# Patient Record
Sex: Female | Born: 1965 | Race: Black or African American | Hispanic: No
Health system: Southern US, Academic
[De-identification: ages and names within clinical notes are randomized; demographics above are authoritative.]

## PROBLEM LIST (undated history)

## (undated) ENCOUNTER — Encounter

## (undated) ENCOUNTER — Ambulatory Visit: Payer: BLUE CROSS/BLUE SHIELD | Attending: Family Medicine | Primary: Family Medicine

## (undated) ENCOUNTER — Encounter: Payer: PRIVATE HEALTH INSURANCE | Attending: Medical | Primary: Medical

## (undated) ENCOUNTER — Ambulatory Visit

## (undated) ENCOUNTER — Telehealth

## (undated) ENCOUNTER — Encounter
Attending: Student in an Organized Health Care Education/Training Program | Primary: Student in an Organized Health Care Education/Training Program

## (undated) ENCOUNTER — Non-Acute Institutional Stay: Payer: PRIVATE HEALTH INSURANCE | Attending: Podiatrist | Primary: Podiatrist

## (undated) ENCOUNTER — Encounter: Attending: Medical | Primary: Medical

## (undated) ENCOUNTER — Ambulatory Visit: Payer: BLUE CROSS/BLUE SHIELD

## (undated) ENCOUNTER — Telehealth: Attending: Medical | Primary: Medical

## (undated) ENCOUNTER — Other Ambulatory Visit

## (undated) ENCOUNTER — Ambulatory Visit: Payer: PRIVATE HEALTH INSURANCE

## (undated) ENCOUNTER — Ambulatory Visit: Attending: Medical | Primary: Medical

## (undated) ENCOUNTER — Ambulatory Visit: Payer: PRIVATE HEALTH INSURANCE | Attending: Medical | Primary: Medical

## (undated) ENCOUNTER — Ambulatory Visit: Payer: BLUE CROSS/BLUE SHIELD | Attending: Obstetrics & Gynecology | Primary: Obstetrics & Gynecology

## (undated) ENCOUNTER — Encounter: Attending: Family Medicine | Primary: Family Medicine

## (undated) ENCOUNTER — Encounter: Attending: Obstetrics & Gynecology | Primary: Obstetrics & Gynecology

## (undated) ENCOUNTER — Ambulatory Visit
Payer: BLUE CROSS/BLUE SHIELD | Attending: Student in an Organized Health Care Education/Training Program | Primary: Student in an Organized Health Care Education/Training Program

## (undated) ENCOUNTER — Ambulatory Visit: Payer: BLUE CROSS/BLUE SHIELD | Attending: Orthopaedic Surgery | Primary: Orthopaedic Surgery

## (undated) ENCOUNTER — Encounter: Attending: Orthopaedic Surgery | Primary: Orthopaedic Surgery

## (undated) ENCOUNTER — Ambulatory Visit: Payer: PRIVATE HEALTH INSURANCE | Attending: Physician Assistant | Primary: Physician Assistant

## (undated) ENCOUNTER — Ambulatory Visit: Attending: Family | Primary: Family

## (undated) ENCOUNTER — Encounter: Attending: Orthopedic | Primary: Orthopedic

## (undated) ENCOUNTER — Telehealth: Attending: Family Medicine | Primary: Family Medicine

## (undated) ENCOUNTER — Telehealth: Attending: Obstetrics & Gynecology | Primary: Obstetrics & Gynecology

## (undated) ENCOUNTER — Encounter: Payer: PRIVATE HEALTH INSURANCE | Attending: Obesity Medicine | Primary: Obesity Medicine

## (undated) ENCOUNTER — Encounter: Attending: Surgery | Primary: Surgery

## (undated) ENCOUNTER — Encounter: Attending: Surgical Critical Care | Primary: Surgical Critical Care

## (undated) ENCOUNTER — Ambulatory Visit: Payer: Medicaid (Managed Care)

## (undated) ENCOUNTER — Ambulatory Visit: Attending: Pharmacist | Primary: Pharmacist

## (undated) ENCOUNTER — Ambulatory Visit: Payer: PRIVATE HEALTH INSURANCE | Attending: Family Medicine | Primary: Family Medicine

## (undated) ENCOUNTER — Encounter: Attending: Diagnostic Radiology | Primary: Diagnostic Radiology

## (undated) ENCOUNTER — Encounter: Attending: Registered" | Primary: Registered"

## (undated) ENCOUNTER — Ambulatory Visit
Payer: BLUE CROSS/BLUE SHIELD | Attending: Rehabilitative and Restorative Service Providers" | Primary: Rehabilitative and Restorative Service Providers"

## (undated) ENCOUNTER — Ambulatory Visit: Payer: PRIVATE HEALTH INSURANCE | Attending: Orthopaedic Surgery | Primary: Orthopaedic Surgery

## (undated) ENCOUNTER — Telehealth: Attending: Family | Primary: Family

## (undated) DIAGNOSIS — O039 Complete or unspecified spontaneous abortion without complication: Secondary | ICD-10-CM

## (undated) DIAGNOSIS — G4730 Sleep apnea, unspecified: Secondary | ICD-10-CM

## (undated) DIAGNOSIS — I10 Essential (primary) hypertension: Secondary | ICD-10-CM

## (undated) DIAGNOSIS — T7840XA Allergy, unspecified, initial encounter: Secondary | ICD-10-CM

## (undated) HISTORY — DX: Complete or unspecified spontaneous abortion without complication: O03.9

## (undated) HISTORY — DX: Essential (primary) hypertension: I10

## (undated) HISTORY — DX: Sleep apnea, unspecified: G47.30

## (undated) HISTORY — DX: Allergy, unspecified, initial encounter: T78.40XA

## (undated) MED ORDER — FERROUS SULFATE 325 MG (65 MG IRON) TABLET,DELAYED RELEASE: ORAL | 0 days

## (undated) MED ORDER — CETIRIZINE 5 MG TABLET: Freq: Every day | ORAL | 0.00000 days

## (undated) MED ORDER — FEXOFENADINE 60 MG TABLET: Freq: Two times a day (BID) | ORAL | 0 days

## (undated) MED ORDER — MULTIVITAMIN GUMMIES 200 MCG CHEWABLE TABLET: ORAL | 0 days

---

## 1993-04-14 HISTORY — PX: OTHER SURGICAL HISTORY: SHX169

## 1998-04-14 HISTORY — PX: OTHER SURGICAL HISTORY: SHX169

## 2003-04-15 DIAGNOSIS — O039 Complete or unspecified spontaneous abortion without complication: Secondary | ICD-10-CM

## 2003-04-15 HISTORY — DX: Complete or unspecified spontaneous abortion without complication: O03.9

## 2003-04-15 HISTORY — PX: DILATION AND CURETTAGE OF UTERUS: SHX78

## 2004-01-24 ENCOUNTER — Emergency Department: Payer: Self-pay | Admitting: Emergency Medicine

## 2009-10-29 ENCOUNTER — Emergency Department: Payer: Self-pay | Admitting: Emergency Medicine

## 2010-10-17 DIAGNOSIS — B353 Tinea pedis: Secondary | ICD-10-CM | POA: Insufficient documentation

## 2011-01-15 ENCOUNTER — Ambulatory Visit: Payer: Self-pay

## 2011-06-04 ENCOUNTER — Ambulatory Visit: Payer: Self-pay

## 2011-06-04 LAB — HCG, QUANTITATIVE, PREGNANCY: Beta Hcg, Quant.: <1 m[IU]/mL — ABNORMAL LOW

## 2011-06-04 LAB — BASIC METABOLIC PANEL
Anion Gap: 8 (ref 7–16)
BUN: 15 mg/dL (ref 7–18)
Creatinine: 1.01 mg/dL (ref 0.60–1.30)
EGFR (African American): >60
EGFR (Non-African Amer.): >60
Glucose: 117 mg/dL — ABNORMAL HIGH (ref 65–99)

## 2011-06-04 LAB — HEMATOCRIT: HCT: 37.3 % (ref 35.0–47.0)

## 2011-06-05 ENCOUNTER — Ambulatory Visit: Payer: Self-pay

## 2013-01-07 ENCOUNTER — Emergency Department: Payer: Self-pay | Admitting: Emergency Medicine

## 2014-08-06 NOTE — Op Note (Signed)
PATIENT NAME:  [name], [name] MR#:  [id number] DATE OF BIRTH:  November 07, 1965  DATE OF PROCEDURE:  06/05/2011  PREOPERATIVE DIAGNOSIS: Abnormal uterine bleeding.   POSTOPERATIVE DIAGNOSIS: Abnormal uterine bleeding.  OPERATION PERFORMED:  1. Dilation and curettage.  2. Hysteroscopy.   SURGEON: Deloris Pinghilip J. Luella Cookosenow, M.D.   ANESTHESIA: General.   OPERATIVE FINDINGS: Extreme difficulty performing dilation and curettage, hysteroscopy secondary to the patient's obesity. No abnormalities visualized on hysteroscopy. A small amount of endometrial curettage was obtained.   DESCRIPTION OF PROCEDURE: After adequate general anesthesia, the patient was prepped and draped in routine fashion. It should be noted that extreme difficulty was encountered attempting to do the dilation and curettage and hysteroscopy secondary to the patient's obesity and lack of visualization. Ultimately, through many methods of retraction, the cervix was visible, grasped with a single-tooth tenaculum. The uterine cavity sounded to 7 cm. The cervix was dilated again with difficulty. The uterine cavity was visualized with no obvious abnormalities. The uterine cavity was then curetted with return of a small amount tissue. The patient tolerated the procedure well and left the Operating Room in good condition. Sponge and needle counts were said to be correct at the end of the procedure.  ____________________________ Deloris PingPhilip J. Luella Cookosenow, MD pjr:cbb D: 06/05/2011 14:33:20 ET T: 06/05/2011 15:06:12 ET JOB#: [id number]  cc: Deloris PingPhilip J. Luella Cookosenow, MD, <Dictator> Towana BadgerPHILIP J Ann-Marie Kluge MD ELECTRONICALLY SIGNED 06/18/2011 7:24

## 2016-09-30 ENCOUNTER — Ambulatory Visit (INDEPENDENT_AMBULATORY_CARE_PROVIDER_SITE_OTHER): Payer: Self-pay | Admitting: Nurse Practitioner

## 2016-09-30 ENCOUNTER — Encounter: Payer: Self-pay | Admitting: Nurse Practitioner

## 2016-09-30 VITALS — BP 151/65 | HR 84 | Ht 63.0 in | Wt 347.0 lb

## 2016-09-30 DIAGNOSIS — I10 Essential (primary) hypertension: Secondary | ICD-10-CM

## 2016-09-30 DIAGNOSIS — G4733 Obstructive sleep apnea (adult) (pediatric): Secondary | ICD-10-CM

## 2016-09-30 DIAGNOSIS — L409 Psoriasis, unspecified: Secondary | ICD-10-CM

## 2016-09-30 DIAGNOSIS — L308 Other specified dermatitis: Secondary | ICD-10-CM

## 2016-09-30 DIAGNOSIS — Z7689 Persons encountering health services in other specified circumstances: Secondary | ICD-10-CM

## 2016-09-30 DIAGNOSIS — G4730 Sleep apnea, unspecified: Secondary | ICD-10-CM | POA: Insufficient documentation

## 2016-09-30 DIAGNOSIS — Z6841 Body Mass Index (BMI) 40.0 and over, adult: Secondary | ICD-10-CM

## 2016-09-30 LAB — POCT UA - MICROALBUMIN: Microalbumin Ur, POC: 50 mg/L

## 2016-09-30 MED ORDER — HYDROXYZINE HCL 10 MG PO TABS: 10.0000 mg | ORAL_TABLET | Freq: Three times a day (TID) | ORAL | 0 refills | Status: DC | PRN

## 2016-09-30 MED ORDER — HYDROCHLOROTHIAZIDE 12.5 MG PO TABS: 12.5000 mg | ORAL_TABLET | Freq: Every day | ORAL | 2 refills | Status: DC

## 2016-09-30 MED ORDER — TRIAMCINOLONE ACETONIDE 0.1 % EX CREA: 1.0000 "application " | TOPICAL_CREAM | Freq: Two times a day (BID) | CUTANEOUS | 0 refills | Status: DC

## 2016-09-30 MED ORDER — LOSARTAN POTASSIUM 50 MG PO TABS: 50.0000 mg | ORAL_TABLET | Freq: Every day | ORAL | 2 refills | Status: DC

## 2016-09-30 NOTE — Progress Notes (Signed)
Subjective:    Patient ID: Julia Snow, female    DOB: 05/01/1965, 51 y.o.   MRN: [id number]  Julia Snow is a 51 y.o. female presenting on 09/30/2016 for Establish Care   HPI  Establish Care New Provider Pt last seen by PCP in 2012 at [facility] in [place].   Psoriasis and Eczema, feet swollen [facility] in past. First started 2008:  Has been using hydrocortisone, triamcinolone ointments.  Currently using husband's triamcinolone.  Bothered by itching and appearance of skin/scalp.  Had previously used derm prescribed medicated shampoo for scalp.    Obstructive Sleep Apnea [facility] sleep center. Has CPAP machine, but does not use r/t claustrophobia with mask.  Pt states she was not able to get nasal prongs.  Hypertension Previously taking: lisinopril and HCTZ no medications in several years.  Perimenopasusal LMP approx 2 weeks ago.  She has occasional hot flashes, but notes are not bothersome.  No pain with sexual intercourse noted.  Not sexually active currently.  Obesity Was 400 lbs in 2008.  Has lost 53 lbs w/ reducing calories and increasing physical activity.  Past Medical History:  Diagnosis Date  . Allergy   . Hypertension   . Sleep apnea    does not wear CPAP   Past Surgical History:  Procedure Laterality Date  . DILATION AND CURETTAGE OF UTERUS  2005  . endometrial surgery  1995  . TUBAL LIGATION     Social History   Social History  . Marital status: Married    Spouse name: N/A  . Number of children: N/A  . Years of education: N/A   Occupational History  . Not on file.   Social History Main Topics  . Smoking status: Never Smoker  . Smokeless tobacco: Never Used  . Alcohol use Yes     Comment: 1-2 rarely  . Drug use: No  . Sexual activity: No     Comment: mutually monogamous relationship   Other Topics Concern  . Not on file   Social History Narrative  . No narrative on file   Family History  Problem Relation Age  of Onset  . Hypertension Mother   . Diabetes Father   . Prostate cancer Father   . Diabetes Sister   . Stroke Paternal Grandmother   . Osteoarthritis Sister   . Breast cancer Maternal Aunt   . Colon cancer Neg Hx   . Ovarian cancer Neg Hx    No current outpatient prescriptions on file prior to visit.   No current facility-administered medications on file prior to visit.     Review of Systems  Constitutional: Negative.   HENT: Negative.   Eyes: Negative.   Respiratory: Negative.   Cardiovascular: Negative.   Gastrointestinal: Negative.   Endocrine: Negative.   Genitourinary: Negative.   Musculoskeletal: Positive for arthralgias and back pain.  Allergic/Immunologic: Negative.   Neurological: Negative.   Hematological: Negative.   Psychiatric/Behavioral: Negative.    Per HPI unless specifically indicated above      Objective:    BP (!) 151/65 (BP Location: Right Arm, Patient Position: Sitting, Cuff Size: Large)   Pulse 84   Ht 5\' 3"  (1.6 m)   Wt (!) 347 lb (157.4 kg)   LMP 09/16/2016   BMI 61.47 kg/m    Wt Readings from Last 3 Encounters:  09/30/16 (!) 347 lb (157.4 kg)    Physical Exam General - morbidly obese, well-appearing, NAD HEENT - Normocephalic, atraumatic,  PERRL, EOMI, patent nares w/o congestion, oropharynx clear, MMM Neck - supple, non-tender, no LAD, no thyromegaly Heart - RRR, bradycardia, no murmurs heard Lungs - Clear throughout all lobes, no wheezing, crackles, or rhonchi. Normal work of breathing. Abdomen - soft, NTND, no masses, no hepatosplenomegaly noted on scratch test, active bowel sounds Extremeties - non-tender, no edema, cap refill < 2 seconds, peripheral pulses intact +2 bilaterally Skin - warm, dry, eczematous rashes in elbow creases bilaterally, psoriatic rash located on scalp, neck Neuro - awake, alert, oriented, CN II-X intact, intact muscle strength 5/5 bilaterally, intact distal sensation to light touch, normal coordination,  normal gait Psych - Normal mood and affect, normal behavior, denies SI/HI  Results for orders placed or performed in visit on 09/30/16  Lipid Profile  Result Value Ref Range   Cholesterol 124 <200 mg/dL   Triglycerides 69 <161 mg/dL   HDL 48 (L) >09 mg/dL   Total CHOL/HDL Ratio 2.6 <5.0 Ratio   VLDL 14 <30 mg/dL   LDL Cholesterol 62 <604 mg/dL  Comprehensive metabolic panel  Result Value Ref Range   Sodium 140 135 - 146 mmol/L   Potassium 3.9 3.5 - 5.3 mmol/L   Chloride 106 98 - 110 mmol/L   CO2 24 20 - 31 mmol/L   Glucose, Bld 99 65 - 99 mg/dL   BUN 14 7 - 25 mg/dL   Creat 5.40 9.81 - 1.91 mg/dL   Total Bilirubin 0.6 0.2 - 1.2 mg/dL   Alkaline Phosphatase 41 33 - 130 U/L   AST 13 10 - 35 U/L   ALT 12 6 - 29 U/L   Total Protein 5.8 (L) 6.1 - 8.1 g/dL   Albumin 3.6 3.6 - 5.1 g/dL   Calcium 8.7 8.6 - 47.8 mg/dL  TSH  Result Value Ref Range   TSH 2.26 mIU/L  POCT UA - Microalbumin  Result Value Ref Range   Microalbumin Ur, POC 50 mg/L   Creatinine, POC  mg/dL   Albumin/Creatinine Ratio, Urine, POC        Assessment & Plan:   Problem List Items Addressed This Visit      Cardiovascular and Mediastinum   Hypertension    Uncontrolled.  Pt not currently on medications and required them in past.    Plan: 1. Start hydrochlorothiazide 12.5 mg once daily. 2. Start losartan 50 mg once daily.  Consider combined pill once dosage stable. 3. Check BP at home.  Goal less than 130/80.  Call clinic if greater than 140/90 after 2 weeks.  4. Follow up 4 weeks.      Relevant Medications   hydrochlorothiazide (HYDRODIURIL) 12.5 MG tablet   losartan (COZAAR) 50 MG tablet   Other Relevant Orders   Lipid Profile (Completed)   Comprehensive metabolic panel (Completed)   POCT UA - Microalbumin (Completed)     Respiratory   Sleep apnea    Pt does not wear CPAP.     Plan: 1. Discussed effect on heart and lungs when untreated. 2. Recommended referral to sleep center.  Pt refused.         Musculoskeletal and Integument   Psoriasis    Uncontrolled.  Bothersome to pt w/ urticaria.  Previously managed by dermatology.  Triamcinolone cream somewhat effective, but not sufficient for pt.  Plan: 1. Start triamcinolone cream 0.1% - Apply twice daily. 2. Start hydroxyzine 10 mg once daily for itching. 3. Referral to Dermatology.  [place] location preferred.      Relevant Medications  triamcinolone cream (KENALOG) 0.1 %   hydrOXYzine (ATARAX/VISTARIL) 10 MG tablet   Eczema    Same as psoriasis plan above        Other   BMI 60.0-69.9, adult (HCC)    Morbidly obese.  Per pt report is improving.  Plan: 1. Celebrated successes. 2. Encouraged continued monitoring of diet and overall consumption. 3. Encouraged increased physical activity. 4. Follow up 4 weeks w/ BP.      Relevant Orders   TSH (Completed)    Other Visit Diagnoses    Encounter to establish care    -  Primary   Pt w/o recent PCP.  [facility].      Meds ordered this encounter  Medications  . hydrochlorothiazide (HYDRODIURIL) 12.5 MG tablet    Sig: Take 1 tablet (12.5 mg total) by mouth daily.    Dispense:  30 tablet    Refill:  2    Order Specific Question:   Supervising Provider    Answer:   Smitty Cords [[id number]]  . triamcinolone cream (KENALOG) 0.1 %    Sig: Apply 1 application topically 2 (two) times daily.    Dispense:  30 g    Refill:  0    Order Specific Question:   Supervising Provider    Answer:   Smitty Cords [[id number]]  . hydrOXYzine (ATARAX/VISTARIL) 10 MG tablet    Sig: Take 1 tablet (10 mg total) by mouth 3 (three) times daily as needed for itching.    Dispense:  30 tablet    Refill:  0    Order Specific Question:   Supervising Provider    Answer:   Smitty Cords [[id number]]  . losartan (COZAAR) 50 MG tablet    Sig: Take 1 tablet (50 mg total) by mouth daily.    Dispense:  30 tablet    Refill:  2     Order Specific Question:   Supervising Provider    Answer:   Smitty Cords [[id number]]    Follow up plan: Return in about 4 weeks (around 10/28/2016) for Blood Pressure.  Wilhelmina Mcardle, DNP, AGPCNP-BC Adult Gerontology Primary Care Nurse Practitioner [facility] 10/06/2016, 10:53 AM

## 2016-09-30 NOTE — Patient Instructions (Addendum)
Julia Snow, Thank you for coming in to clinic today.  1. For your hypertension: - START taking hydrochlorothiazide 12.5 mg tablet once daily.  - TRY checking BP 2 x per week and keep a log. - Goal is less than 130/80.  If higher than this after on hydrochlorothiazide for 2 weeks, let me know.  We can increase your dose or add another medication.  2. For your dermatology: - You have eczema and psoriasis.  Start using triamcinolone lotion. - I have sent a referral to a dermatologist.  Please schedule a follow-up appointment with Wilhelmina Mcardle, AGNP to Return in about 4 weeks (around 10/28/2016) for Blood Pressure.  If you have any other questions or concerns, please feel free to call the clinic or send a message through MyChart. You may also schedule an earlier appointment if necessary.  Wilhelmina Mcardle, DNP, AGNP-BC Adult Gerontology Nurse Practitioner [facility]        DASH Eating Plan DASH stands for "Dietary Approaches to Stop Hypertension." The DASH eating plan is a healthy eating plan that has been shown to reduce high blood pressure (hypertension). It may also reduce your risk for type 2 diabetes, heart disease, and stroke. The DASH eating plan may also help with weight loss. What are tips for following this plan? General guidelines  Avoid eating more than 2,300 mg (milligrams) of salt (sodium) a day. If you have hypertension, you may need to reduce your sodium intake to 1,500 mg a day.  Limit alcohol intake to no more than 1 drink a day for nonpregnant women and 2 drinks a day for men. One drink equals 12 oz of beer, 5 oz of wine, or 1 oz of hard liquor.  Work with your health care provider to maintain a healthy body weight or to lose weight. Ask what an ideal weight is for you.  Get at least 30 minutes of exercise that causes your heart to beat faster (aerobic exercise) most days of the week. Activities may include walking, swimming, or biking.  Work  with your health care provider or diet and nutrition specialist (dietitian) to adjust your eating plan to your individual calorie needs. Reading food labels  Check food labels for the amount of sodium per serving. Choose foods with less than 5 percent of the Daily Value of sodium. Generally, foods with less than 300 mg of sodium per serving fit into this eating plan.  To find whole grains, look for the word "whole" as the first word in the ingredient list. Shopping  Buy products labeled as "low-sodium" or "no salt added."  Buy fresh foods. Avoid canned foods and premade or frozen meals. Cooking  Avoid adding salt when cooking. Use salt-free seasonings or herbs instead of table salt or sea salt. Check with your health care provider or pharmacist before using salt substitutes.  Do not fry foods. Cook foods using healthy methods such as baking, boiling, grilling, and broiling instead.  Cook with heart-healthy oils, such as olive, canola, soybean, or sunflower oil. Meal planning   Eat a balanced diet that includes: ? 5 or more servings of fruits and vegetables each day. At each meal, try to fill half of your plate with fruits and vegetables. ? Up to 6-8 servings of whole grains each day. ? Less than 6 oz of lean meat, poultry, or fish each day. A 3-oz serving of meat is about the same size as a deck of cards. One egg equals 1 oz. ? 2  servings of low-fat dairy each day. ? A serving of nuts, seeds, or beans 5 times each week. ? Heart-healthy fats. Healthy fats called Omega-3 fatty acids are found in foods such as flaxseeds and coldwater fish, like sardines, salmon, and mackerel.  Limit how much you eat of the following: ? Canned or prepackaged foods. ? Food that is high in trans fat, such as fried foods. ? Food that is high in saturated fat, such as fatty meat. ? Sweets, desserts, sugary drinks, and other foods with added sugar. ? Full-fat dairy products.  Do not salt foods before  eating.  Try to eat at least 2 vegetarian meals each week.  Eat more home-cooked food and less restaurant, buffet, and fast food.  When eating at a restaurant, ask that your food be prepared with less salt or no salt, if possible. What foods are recommended? The items listed may not be a complete list. Talk with your dietitian about what dietary choices are best for you. Grains Whole-grain or whole-wheat bread. Whole-grain or whole-wheat pasta. Brown rice. Orpah Cobbatmeal. Quinoa. Bulgur. Whole-grain and low-sodium cereals. Pita bread. Low-fat, low-sodium crackers. Whole-wheat flour tortillas. Vegetables Fresh or frozen vegetables (raw, steamed, roasted, or grilled). Low-sodium or reduced-sodium tomato and vegetable juice. Low-sodium or reduced-sodium tomato sauce and tomato paste. Low-sodium or reduced-sodium canned vegetables. Fruits All fresh, dried, or frozen fruit. Canned fruit in natural juice (without added sugar). Meat and other protein foods Skinless chicken or Malawiturkey. Ground chicken or Malawiturkey. Pork with fat trimmed off. Fish and seafood. Egg whites. Dried beans, peas, or lentils. Unsalted nuts, nut butters, and seeds. Unsalted canned beans. Lean cuts of beef with fat trimmed off. Low-sodium, lean deli meat. Dairy Low-fat (1%) or fat-free (skim) milk. Fat-free, low-fat, or reduced-fat cheeses. Nonfat, low-sodium ricotta or cottage cheese. Low-fat or nonfat yogurt. Low-fat, low-sodium cheese. Fats and oils Soft margarine without trans fats. Vegetable oil. Low-fat, reduced-fat, or light mayonnaise and salad dressings (reduced-sodium). Canola, safflower, olive, soybean, and sunflower oils. Avocado. Seasoning and other foods Herbs. Spices. Seasoning mixes without salt. Unsalted popcorn and pretzels. Fat-free sweets. What foods are not recommended? The items listed may not be a complete list. Talk with your dietitian about what dietary choices are best for you. Grains Baked goods made with fat,  such as croissants, muffins, or some breads. Dry pasta or rice meal packs. Vegetables Creamed or fried vegetables. Vegetables in a cheese sauce. Regular canned vegetables (not low-sodium or reduced-sodium). Regular canned tomato sauce and paste (not low-sodium or reduced-sodium). Regular tomato and vegetable juice (not low-sodium or reduced-sodium). Rosita FirePickles. Olives. Fruits Canned fruit in a light or heavy syrup. Fried fruit. Fruit in cream or butter sauce. Meat and other protein foods Fatty cuts of meat. Ribs. Fried meat. Tomasa BlaseBacon. Sausage. Bologna and other processed lunch meats. Salami. Fatback. Hotdogs. Bratwurst. Salted nuts and seeds. Canned beans with added salt. Canned or smoked fish. Whole eggs or egg yolks. Chicken or Malawiturkey with skin. Dairy Whole or 2% milk, cream, and half-and-half. Whole or full-fat cream cheese. Whole-fat or sweetened yogurt. Full-fat cheese. Nondairy creamers. Whipped toppings. Processed cheese and cheese spreads. Fats and oils Butter. Stick margarine. Lard. Shortening. Ghee. Bacon fat. Tropical oils, such as coconut, palm kernel, or palm oil. Seasoning and other foods Salted popcorn and pretzels. Onion salt, garlic salt, seasoned salt, table salt, and sea salt. Worcestershire sauce. Tartar sauce. Barbecue sauce. Teriyaki sauce. Soy sauce, including reduced-sodium. Steak sauce. Canned and packaged gravies. Fish sauce. Oyster sauce. Cocktail sauce. Horseradish  that you find on the shelf. Ketchup. Mustard. Meat flavorings and tenderizers. Bouillon cubes. Hot sauce and Tabasco sauce. Premade or packaged marinades. Premade or packaged taco seasonings. Relishes. Regular salad dressings. Where to find more information:  National Heart, Lung, and Blood Institute: PopSteam.is  American Heart Association: www.heart.org Summary  The DASH eating plan is a healthy eating plan that has been shown to reduce high blood pressure (hypertension). It may also reduce your risk for  type 2 diabetes, heart disease, and stroke.  With the DASH eating plan, you should limit salt (sodium) intake to 2,300 mg a day. If you have hypertension, you may need to reduce your sodium intake to 1,500 mg a day.  When on the DASH eating plan, aim to eat more fresh fruits and vegetables, whole grains, lean proteins, low-fat dairy, and heart-healthy fats.  Work with your health care provider or diet and nutrition specialist (dietitian) to adjust your eating plan to your individual calorie needs. This information is not intended to replace advice given to you by your health care provider. Make sure you discuss any questions you have with your health care provider. Document Released: 03/20/2011 Document Revised: 03/24/2016 Document Reviewed: 03/24/2016 Elsevier Interactive Patient Education  2017 [employer].

## 2016-10-01 LAB — COMPREHENSIVE METABOLIC PANEL
ALT: 12 U/L (ref 6–29)
AST: 13 U/L (ref 10–35)
Albumin: 3.6 g/dL (ref 3.6–5.1)
Alkaline Phosphatase: 41 U/L (ref 33–130)
BUN: 14 mg/dL (ref 7–25)
CO2: 24 mmol/L (ref 20–31)
Calcium: 8.7 mg/dL (ref 8.6–10.4)
Chloride: 106 mmol/L (ref 98–110)
Creat: 0.92 mg/dL (ref 0.50–1.05)
Glucose, Bld: 99 mg/dL (ref 65–99)
Potassium: 3.9 mmol/L (ref 3.5–5.3)
Sodium: 140 mmol/L (ref 135–146)
Total Bilirubin: 0.6 mg/dL (ref 0.2–1.2)
Total Protein: 5.8 g/dL — ABNORMAL LOW (ref 6.1–8.1)

## 2016-10-01 LAB — LIPID PANEL
Cholesterol: 124 mg/dL (ref <200)
HDL: 48 mg/dL — ABNORMAL LOW (ref >50)
LDL Cholesterol: 62 mg/dL (ref <100)
Total CHOL/HDL Ratio: 2.6 Ratio (ref <5.0)
Triglycerides: 69 mg/dL (ref <150)
VLDL: 14 mg/dL (ref <30)

## 2016-10-01 LAB — TSH: TSH: 2.26 mIU/L

## 2016-10-06 DIAGNOSIS — Z6841 Body Mass Index (BMI) 40.0 and over, adult: Secondary | ICD-10-CM | POA: Insufficient documentation

## 2016-10-06 DIAGNOSIS — L409 Psoriasis, unspecified: Secondary | ICD-10-CM | POA: Insufficient documentation

## 2016-10-06 DIAGNOSIS — L309 Dermatitis, unspecified: Secondary | ICD-10-CM | POA: Insufficient documentation

## 2016-10-06 NOTE — Progress Notes (Signed)
I have reviewed this encounter including the documentation in this note and/or discussed this patient with the provider, Wilhelmina McardleLauren Kennedy, AGPCNP-BC. I am certifying that I agree with the content of this note as supervising physician.  Saralyn PilarAlexander Kwali Wrinkle, DO [facility] 10/06/2016, 1:52 PM

## 2016-10-06 NOTE — Assessment & Plan Note (Signed)
Uncontrolled.  Bothersome to pt w/ urticaria.  Previously managed by dermatology.  Triamcinolone cream somewhat effective, but not sufficient for pt.  Plan: 1. Start triamcinolone cream 0.1% - Apply twice daily. 2. Start hydroxyzine 10 mg once daily for itching. 3. Referral to Dermatology.  [place] location preferred.

## 2016-10-06 NOTE — Assessment & Plan Note (Signed)
Same as psoriasis plan above

## 2016-10-06 NOTE — Assessment & Plan Note (Signed)
Pt does not wear CPAP.     Plan: 1. Discussed effect on heart and lungs when untreated. 2. Recommended referral to sleep center.  Pt refused.

## 2016-10-06 NOTE — Assessment & Plan Note (Signed)
Uncontrolled.  Pt not currently on medications and required them in past.    Plan: 1. Start hydrochlorothiazide 12.5 mg once daily. 2. Start losartan 50 mg once daily.  Consider combined pill once dosage stable. 3. Check BP at home.  Goal less than 130/80.  Call clinic if greater than 140/90 after 2 weeks.  4. Follow up 4 weeks.

## 2016-10-06 NOTE — Assessment & Plan Note (Signed)
Morbidly obese.  Per pt report is improving.  Plan: 1. Celebrated successes. 2. Encouraged continued monitoring of diet and overall consumption. 3. Encouraged increased physical activity. 4. Follow up 4 weeks w/ BP.

## 2016-10-28 ENCOUNTER — Ambulatory Visit (INDEPENDENT_AMBULATORY_CARE_PROVIDER_SITE_OTHER): Payer: Self-pay | Admitting: Nurse Practitioner

## 2016-10-28 ENCOUNTER — Ambulatory Visit: Payer: Medicaid Other | Admitting: Nurse Practitioner

## 2016-10-28 VITALS — BP 133/72 | HR 80 | Temp 97.5°F | Ht 63.5 in | Wt 350.0 lb

## 2016-10-28 DIAGNOSIS — I10 Essential (primary) hypertension: Secondary | ICD-10-CM

## 2016-10-28 DIAGNOSIS — R748 Abnormal levels of other serum enzymes: Secondary | ICD-10-CM

## 2016-10-28 DIAGNOSIS — Z6841 Body Mass Index (BMI) 40.0 and over, adult: Secondary | ICD-10-CM

## 2016-10-28 DIAGNOSIS — Z79899 Other long term (current) drug therapy: Secondary | ICD-10-CM

## 2016-10-28 MED ORDER — LOSARTAN POTASSIUM 50 MG PO TABS: 50.0000 mg | ORAL_TABLET | Freq: Every day | ORAL | 2 refills | Status: DC

## 2016-10-28 MED ORDER — HYDROCHLOROTHIAZIDE 12.5 MG PO TABS: 12.5000 mg | ORAL_TABLET | Freq: Every day | ORAL | 2 refills | Status: DC

## 2016-10-28 MED ORDER — FISH OIL 1000 MG PO CAPS: 1.0000 | ORAL_CAPSULE | Freq: Every day | ORAL | 11 refills | Status: DC

## 2016-10-28 NOTE — Progress Notes (Signed)
Subjective:    Patient ID: Margretta DittyJanice G Patmon, female    DOB: 06/09/1965, 51 y.o.   MRN: [id number]  Margretta DittyJanice G Kaczynski is a 51 y.o. female presenting on 10/28/2016 for Hypertension   HPI Hypertension She is checking BP outside clinic at a plasma donation center.  Last reading 140/78 and other readings have never been less than 130 at plasma center.  Donates plasma twice per week.   Current medications: losartan 50 mg once daily through Sunday.  Has only started hydrochlorothiazide 12.5 mg once daily since Sunday, tolerating well without side effects so far and has not taken both medications together.  Cough has resolved on losartan.  Pt denies headache, lightheadedness, dizziness, changes in vision, chest tightness/pressure, palpitations, leg swelling, sudden loss of speech or loss of consciousness.  Weight Eats 1-3 meals per day.  Has had someone tell her she might not be eating enough for weight loss.  Pt references many chinese food options that she enjoys, suspect pt eats high-calorie meals.   Social History  Substance Use Topics  . Smoking status: Never Smoker  . Smokeless tobacco: Never Used  . Alcohol use Yes     Comment: 1-2 rarely    Review of Systems Per HPI unless specifically indicated above     Objective:    BP 133/72 (BP Location: Left Arm, Patient Position: Sitting, Cuff Size: Large)   Pulse 80   Temp (!) 97.5 F (36.4 C) (Oral)   Ht 5' 3.5" (1.613 m)   Wt (!) 350 lb (158.8 kg)   BMI 61.03 kg/m     Wt Readings from Last 3 Encounters:  10/28/16 (!) 350 lb (158.8 kg)  09/30/16 (!) 347 lb (157.4 kg)    Physical Exam  General - morbidly obese, well-appearing, NAD HEENT - Normocephalic, atraumatic Neck - supple, non-tender, no LAD Heart - RRR, no murmurs heard Lungs - Clear throughout all lobes, no wheezing, crackles, or rhonchi. Normal work of breathing. Extremeties - non-tender, no edema, cap refill < 2 seconds, peripheral pulses intact +2 bilaterally Skin -  warm, dry Neuro - awake, alert, oriented x3, normal gait Psych - Normal mood and affect, normal behavior    Results for orders placed or performed in visit on 09/30/16  Lipid Profile  Result Value Ref Range   Cholesterol 124 <200 mg/dL   Triglycerides 69 <409<150 mg/dL   HDL 48 (L) >81>50 mg/dL   Total CHOL/HDL Ratio 2.6 <5.0 Ratio   VLDL 14 <30 mg/dL   LDL Cholesterol 62 <191<100 mg/dL  Comprehensive metabolic panel  Result Value Ref Range   Sodium 140 135 - 146 mmol/L   Potassium 3.9 3.5 - 5.3 mmol/L   Chloride 106 98 - 110 mmol/L   CO2 24 20 - 31 mmol/L   Glucose, Bld 99 65 - 99 mg/dL   BUN 14 7 - 25 mg/dL   Creat 4.780.92 2.950.50 - 6.211.05 mg/dL   Total Bilirubin 0.6 0.2 - 1.2 mg/dL   Alkaline Phosphatase 41 33 - 130 U/L   AST 13 10 - 35 U/L   ALT 12 6 - 29 U/L   Total Protein 5.8 (L) 6.1 - 8.1 g/dL   Albumin 3.6 3.6 - 5.1 g/dL   Calcium 8.7 8.6 - 30.810.4 mg/dL  TSH  Result Value Ref Range   TSH 2.26 mIU/L  POCT UA - Microalbumin  Result Value Ref Range   Microalbumin Ur, POC 50 mg/L   Creatinine, POC  mg/dL   Albumin/Creatinine Ratio,  Urine, POC        Assessment & Plan:   Problem List Items Addressed This Visit      Cardiovascular and Mediastinum   Hypertension    Improved and almost controlled.  Pt has not been taking both her losartan and HCTZ.  She is currently only taking HCTZ.  Plan: 1. Continue hydrochlorothiazide 12.5 mg once daily. 2. Continue losartan 50 mg once daily.  Consider combined pill once dosage stable. 3. Check BP at home.  Goal less than 130/80.  Call clinic if greater than 140/90.  4. Follow up 2 months.      Relevant Medications   losartan (COZAAR) 50 MG tablet   hydrochlorothiazide (HYDRODIURIL) 12.5 MG tablet   Other Relevant Orders   BASIC METABOLIC PANEL WITH GFR     Other   BMI 60.0-69.9, adult (HCC)    Morbidly obese.  Per pt report is improving.  Plan: 1. Encouraged continued monitoring of diet and overall consumption. 2. Start w/ food  logging to establish baseline dietary intake.  Then improve food choices and reduce serving sizes.  Can use MyFitnessPal.  Consider nutrition visit in future if pt unsuccessful on her own. 3. Encouraged increased physical activity. Find exercises w/ modifications. 4. Follow up 8 weeks.       Other Visit Diagnoses    Low serum HDL    -  Primary Pt w/ low HDL on last lab check.    Plan: 1. Start taking 1,000 mg fish oil daily. 2. Recheck Lipid panel 6 mos-1 year.   Relevant Medications   Omega-3 Fatty Acids (FISH OIL) 1000 MG CAPS    Encounter for long-term current use of high risk medication     Pt taking losartan.  Has only missed 3 doses of medication.  Plan: 1.  Recheck BMP.    Relevant Orders   BASIC METABOLIC PANEL WITH GFR      Meds ordered this encounter  Medications  . Omega-3 Fatty Acids (FISH OIL) 1000 MG CAPS    Sig: Take 1 capsule (1,000 mg total) by mouth daily.    Dispense:  30 capsule    Refill:  11    Order Specific Question:   Supervising Provider    Answer:   Smitty Cords [[id number]]  . losartan (COZAAR) 50 MG tablet    Sig: Take 1 tablet (50 mg total) by mouth daily.    Dispense:  30 tablet    Refill:  2    Order Specific Question:   Supervising Provider    Answer:   Smitty Cords [[id number]]  . hydrochlorothiazide (HYDRODIURIL) 12.5 MG tablet    Sig: Take 1 tablet (12.5 mg total) by mouth daily.    Dispense:  30 tablet    Refill:  2    Order Specific Question:   Supervising Provider    Answer:   Smitty Cords [[id number]]      Follow up plan: Return in about 2 months (around 12/29/2016) for blood pressure, weight.   Wilhelmina Mcardle, DNP, AGPCNP-BC Adult Gerontology Primary Care Nurse Practitioner [facility] 10/30/2016, 1:15 PM

## 2016-10-28 NOTE — Patient Instructions (Addendum)
Julia Snow, Thank you for coming in to clinic today.  1. For your weight:  Review your calorie intake:  Establish baseline intake.  If more than 1800 calories, reduce by about 500 calories per day probably around 1400 - 1600 calories per day.  Continue for 1-2 weeks, then stop calorie counting, continue food log, and continue new eating pattern. - Eat at least 1200 calories per day. - Weight loss goal 1/2 to 1 lb weekly. - Consider nutrition visits if covered by insurance or pt willing to pay out of pocket.  Facebook: [name] is one group that has source for exercises w/ modifications.  Start small and do what you can w/o causing pain.  If you have pain stop.   2. For your blood pressure: - take both the losartan and hydrochlorothiazide daily. - Come back for blood work tomorrow morning 8-11:30.  - Check you blood pressure.  Goal is less than 130/80. If you have a blood pressure around 100/70, call clinic and we need to change medicines.   Please schedule a follow-up appointment with [name], AGNP to Return in about 2 months (around 12/29/2016) for blood pressure, weight.  If you have any other questions or concerns, please feel free to call the clinic or send a message through MyChart. You may also schedule an earlier appointment if necessary.  Wilhelmina McardleLauren [name], DNP, AGNP-BC Adult Gerontology Nurse Practitioner [facility], [facility]    Serving Sizes A serving size is a measured amount of food or drink, such as one slice of bread, that has an associated nutrient content. Knowing the serving size of a food or drink can help you determine how much of that food you should consume. What is the size of one serving? The size of one healthy serving depends on the food or drink. To determine a serving size, read the food label. If the food or drink does not have a food label, try to find serving size information online. Or, use the following to estimate the size of one  adult serving: Grain 1 slice bread.  bagel.  cup pasta. Vegetable  cup cooked or canned vegetables. 1 cup raw, leafy greens. Fruit  cup canned fruit. 1 medium fruit.  cup dried fruit. Meat and Other Protein Sources 1 oz meat, poultry, or fish.  cup cooked beans. 1 egg.  cup nuts or seeds. 1 Tbsp nut butter.  cup tofu or tempeh. 2 Tbsp hummus. Dairy An individual container of yogurt (6-8 oz). 1 piece of cheese the size of your thumb (1 oz). 1 cup (8 oz) milk or milk alternative. Fat A piece the size of one dice. 1 tsp soft margarine. 1 Tbsp mayonnaise. 1 tsp vegetable oil. 1 Tbsp regular salad dressing. 2 Tbsp low-fat salad dressing. How many servings should I eat from each food group each day? The following are the suggested number of servings to try and have every day from each food group. You can also look at your eating throughout the week and aim for meeting these requirements on most days for overall healthy eating. Grain 6-8 servings. Try to have half of your grains from whole grains, such as whole wheat bread, corn tortillas, oatmeal, brown rice, whole wheat pasta, and bulgur. Vegetable At least 2-3 servings. Fruit 2 servings. Meat and Other Protein Foods 5-6 servings. Aim to have lean proteins, such as chicken, [place], fish, beans, or tofu. Dairy 3 servings. Choose low-fat or nonfat if you are trying to control your  weight. Fat 2-3 servings. Is a serving the same thing as a portion? No. A portion is the actual amount you eat, which may be more than one serving. Knowing the specific serving size of a food and the nutritional information that goes with it can help you make a healthy decision on what size portion to eat. What are some tips to help me learn healthy serving sizes?  Check food labels for serving sizes. Many foods that come as a single portion actually contain multiple servings.  Determine the serving size of foods you commonly eat and figure out how large  a portion you usually eat.  Measure the number of servings that can be held by the bowls, glasses, cups, and plates you typically use. For example, pour your breakfast cereal into your regular bowl and then pour it into a measuring cup.  For 1-2 days, measure the serving sizes of all the foods you eat.  Practice estimating serving sizes and determining how big your portions should be. This information is not intended to replace advice given to you by your health care provider. Make sure you discuss any questions you have with your health care provider. Document Released: 12/28/2002 Document Revised: 11/24/2015 Document Reviewed: 06/28/2013 Elsevier Interactive Patient Education  [employer].

## 2016-10-30 ENCOUNTER — Encounter: Payer: Self-pay | Admitting: Nurse Practitioner

## 2016-10-30 NOTE — Progress Notes (Signed)
I have reviewed this encounter including the documentation in this note and/or discussed this patient with the provider, Wilhelmina McardleLauren Kennedy, AGPCNP-BC. I am certifying that I agree with the content of this note as supervising physician.  Saralyn PilarAlexander Karamalegos, DO [facility] 10/30/2016, 3:08 PM

## 2016-10-30 NOTE — Assessment & Plan Note (Addendum)
Improved and almost controlled.  Pt has not been taking both her losartan and HCTZ.  She is currently only taking HCTZ.  Plan: 1. Continue hydrochlorothiazide 12.5 mg once daily. 2. Continue losartan 50 mg once daily.  Consider combined pill once dosage stable. 3. Check BP at home.  Goal less than 130/80.  Call clinic if greater than 140/90.  4. Follow up 2 months.

## 2016-10-30 NOTE — Assessment & Plan Note (Signed)
Morbidly obese.  Per pt report is improving.  Plan: 1. Encouraged continued monitoring of diet and overall consumption. 2. Start w/ food logging to establish baseline dietary intake.  Then improve food choices and reduce serving sizes.  Can use MyFitnessPal. 3. Encouraged increased physical activity. 4. Follow up 8 weeks.

## 2016-12-30 ENCOUNTER — Ambulatory Visit: Payer: Medicaid Other | Admitting: Nurse Practitioner

## 2017-01-08 ENCOUNTER — Ambulatory Visit (INDEPENDENT_AMBULATORY_CARE_PROVIDER_SITE_OTHER): Payer: Self-pay | Admitting: Nurse Practitioner

## 2017-01-08 ENCOUNTER — Encounter: Payer: Self-pay | Admitting: Nurse Practitioner

## 2017-01-08 VITALS — BP 149/75 | HR 83 | Temp 97.5°F | Ht 63.5 in | Wt 355.0 lb

## 2017-01-08 DIAGNOSIS — L308 Other specified dermatitis: Secondary | ICD-10-CM

## 2017-01-08 DIAGNOSIS — Z6841 Body Mass Index (BMI) 40.0 and over, adult: Secondary | ICD-10-CM

## 2017-01-08 DIAGNOSIS — L409 Psoriasis, unspecified: Secondary | ICD-10-CM

## 2017-01-08 DIAGNOSIS — I10 Essential (primary) hypertension: Secondary | ICD-10-CM

## 2017-01-08 MED ORDER — HYDROXYZINE HCL 10 MG PO TABS: 10.0000 mg | ORAL_TABLET | Freq: Three times a day (TID) | ORAL | 2 refills | Status: DC | PRN

## 2017-01-08 MED ORDER — NALTREXONE-BUPROPION HCL ER 8-90 MG PO TB12: 2.0000 | ORAL_TABLET | Freq: Two times a day (BID) | ORAL | 1 refills | Status: DC

## 2017-01-08 MED ORDER — HYDROCHLOROTHIAZIDE 25 MG PO TABS: 25.0000 mg | ORAL_TABLET | Freq: Every day | ORAL | 3 refills | Status: DC

## 2017-01-08 MED ORDER — NALTREXONE-BUPROPION HCL ER 8-90 MG PO TB12: ORAL_TABLET | ORAL | 0 refills | Status: DC

## 2017-01-08 MED ORDER — CRISABOROLE 2 % EX OINT: 1.0000 "application " | TOPICAL_OINTMENT | Freq: Two times a day (BID) | CUTANEOUS | 2 refills | Status: DC

## 2017-01-08 MED ORDER — LOSARTAN POTASSIUM 50 MG PO TABS: 50.0000 mg | ORAL_TABLET | Freq: Every day | ORAL | 2 refills | Status: DC

## 2017-01-08 NOTE — Progress Notes (Signed)
Subjective:    Patient ID: Julia Snow, female    DOB: 10-09-1965, 51 y.o.   MRN: [id number]  Julia Snow is a 51 y.o. female presenting on 01/08/2017 for Weight Check (blood pressure )   HPI Obesity Has cravings for sweets.  Chocolate - 2 cupcakes, single whole candy bar.  Has this 3 days per week. - Has increased vegetables, Is still eating chinese about 1x per week.   - Has challenges w/ preparing foods from home.   - Kept food log for 1 week.  - Notes can improve w/ sweets and chips.   - Has transitioned to eating off small plate (salad plate). - Pt claims has indigestion w/ all fruits.  No indigestion w/ chocolate. - Has back pain w/ exercise.  Hypertension - She is checking BP at home or outside of clinic.  Readings 139/80; 140-75 - Current medications: hydrochlorothiazide 12.5, tolerating well without side effects - She is not currently symptomatic. - Pt denies headache, lightheadedness, dizziness, changes in vision, chest tightness/pressure, palpitations, leg swelling, sudden loss of speech or loss of consciousness. - She  reports no regular exercise routine. - Her diet is moderate in salt, moderate in fat, and moderate in carbohydrates.  Psoriasis/Eczema - Pt still having rashes even w/ topical triamcinolone ointment.  - Hydroxyzine helps and pt requests refill.  Social History  Substance Use Topics  . Smoking status: Never Smoker  . Smokeless tobacco: Never Used  . Alcohol use Yes     Comment: 1-2 rarely    Review of Systems Per HPI unless specifically indicated above     Objective:    BP (!) 149/75 (BP Location: Right Arm, Patient Position: Sitting, Cuff Size: Large)   Pulse 83   Temp (!) 97.5 F (36.4 C) (Oral)   Ht 5' 3.5" (1.613 m)   Wt (!) 355 lb (161 kg)   BMI 61.90 kg/m   Wt Readings from Last 3 Encounters:  01/08/17 (!) 355 lb (161 kg)  10/28/16 (!) 350 lb (158.8 kg)  09/30/16 (!) 347 lb (157.4 kg)    Physical Exam General - morbidly  obese, well-appearing, NAD HEENT - Normocephalic, atraumatic Neck - supple, non-tender, no LAD, no thyromegaly, no carotid bruit Heart - RRR, no murmurs heard Lungs - Clear throughout all lobes, no wheezing, crackles, or rhonchi. Normal work of breathing. Extremeties - non-tender, no edema, cap refill < 2 seconds, peripheral pulses intact +2 bilaterally Skin - warm, dry Neuro - awake, alert, oriented x3, normal gait Psych - Normal mood and affect, normal behavior   Results for orders placed or performed in visit on 09/30/16  Lipid Profile  Result Value Ref Range   Cholesterol 124 <200 mg/dL   Triglycerides 69 <409 mg/dL   HDL 48 (L) >81 mg/dL   Total CHOL/HDL Ratio 2.6 <5.0 Ratio   VLDL 14 <30 mg/dL   LDL Cholesterol 62 <191 mg/dL  Comprehensive metabolic panel  Result Value Ref Range   Sodium 140 135 - 146 mmol/L   Potassium 3.9 3.5 - 5.3 mmol/L   Chloride 106 98 - 110 mmol/L   CO2 24 20 - 31 mmol/L   Glucose, Bld 99 65 - 99 mg/dL   BUN 14 7 - 25 mg/dL   Creat 4.78 2.95 - 6.21 mg/dL   Total Bilirubin 0.6 0.2 - 1.2 mg/dL   Alkaline Phosphatase 41 33 - 130 U/L   AST 13 10 - 35 U/L   ALT 12 6 - 29  U/L   Total Protein 5.8 (L) 6.1 - 8.1 g/dL   Albumin 3.6 3.6 - 5.1 g/dL   Calcium 8.7 8.6 - 16.1 mg/dL  TSH  Result Value Ref Range   TSH 2.26 mIU/L  POCT UA - Microalbumin  Result Value Ref Range   Microalbumin Ur, POC 50 mg/L   Creatinine, POC  mg/dL   Albumin/Creatinine Ratio, Urine, POC        Assessment & Plan:   Problem List Items Addressed This Visit      Cardiovascular and Mediastinum   Hypertension    Remains uncontrolled, but close to goal.  Pt is taking both her losartan and HCTZ w/o side effects  Plan: 1. INCREASE hydrochlorothiazide 25 mg once daily. 2. Continue losartan 50 mg once daily.  Consider combined pill once dosage stable. 3. Check BP at home.  Goal less than 130/80.  Call clinic if greater than 140/90.  4. Follow up 3 months.      Relevant  Medications   hydrochlorothiazide (HYDRODIURIL) 25 MG tablet   losartan (COZAAR) 50 MG tablet     Musculoskeletal and Integument   Psoriasis    Uncontrolled.  Bothersome to pt w/ urticaria.  Previously managed by dermatology.  Triamcinolone cream somewhat effective, but not sufficient for pt.  Plan: 1. Start triamcinolone cream 0.1% - Apply twice daily. 2. Start hydroxyzine 10 mg once daily for itching. 3. Follow up as needed.      Relevant Medications   hydrOXYzine (ATARAX/VISTARIL) 10 MG tablet   Eczema    Remains uncontrolled.  Psoriasis improved, but eczema remains w/o improvement using triamcinolone cream.  Plan: 1. START Eucrisa ointment - apply 2x per day. 2. Follow up as needed.        Other   BMI 60.0-69.9, adult (HCC) - Primary    Morbidly obese and unstable.  Worsening since last visit.  Pt admits dietary indiscretions worst w/ sweets and chips.  Has not continued working w/ food log.  Pt requests to start Contrave.  Plan: 1. Encouraged continued monitoring of diet and overall consumption. 2. Start w/ food logging to establish baseline dietary intake.  Then improve food choices and reduce serving sizes.  Can use MyFitnessPal. 3. Encouraged increased physical activity. 4. Pt can start Contrave.  Discussed may be high cost.  Start w/ one tablet once daily increase one tablet per week until taking 2 tablets twice daily. 5. Placed MNT referral for 3 hours of counseling in divided sessions. 6. Follow up 3 months or sooner if needed.      Relevant Medications   Naltrexone-Bupropion HCl ER 8-90 MG TB12   Naltrexone-Bupropion HCl ER 8-90 MG TB12 (Start on 02/07/2017)   Other Relevant Orders   Amb ref to Medical Nutrition Therapy-MNT      Meds ordered this encounter  Medications  . hydrochlorothiazide (HYDRODIURIL) 25 MG tablet    Sig: Take 1 tablet (25 mg total) by mouth daily.    Dispense:  30 tablet    Refill:  3    Order Specific Question:   Supervising  Provider    Answer:   Smitty Cords [[id number]]  . losartan (COZAAR) 50 MG tablet    Sig: Take 1 tablet (50 mg total) by mouth daily.    Dispense:  30 tablet    Refill:  2    Order Specific Question:   Supervising Provider    Answer:   Smitty Cords [[id number]]  . hydrOXYzine (ATARAX/VISTARIL) 10 MG tablet  Sig: Take 1 tablet (10 mg total) by mouth 3 (three) times daily as needed for itching.    Dispense:  60 tablet    Refill:  2    Order Specific Question:   Supervising Provider    Answer:   Smitty Cords [[id number]]  . Crisaborole (EUCRISA) 2 % OINT    Sig: Apply 1 application topically 2 (two) times daily.    Dispense:  1 Tube    Refill:  2    Order Specific Question:   Supervising Provider    Answer:   Smitty Cords [[id number]]  . Naltrexone-Bupropion HCl ER 8-90 MG TB12    Sig: Take 1 tab once daily for 1 wk.  Take 1 tab twice daily for 1 wk.  Take 2 tabs in am, 1 tab in pm for 1 wk. Then, take 2 tabs twice daily.    Dispense:  70 tablet    Refill:  0    Order Specific Question:   Supervising Provider    Answer:   Smitty Cords [[id number]]  . Naltrexone-Bupropion HCl ER 8-90 MG TB12    Sig: Take 2 tablets by mouth 2 (two) times daily.    Dispense:  120 tablet    Refill:  1    Order Specific Question:   Supervising Provider    Answer:   Smitty Cords [[id number]]      Follow up plan: Return in about 3 months (around 04/09/2017) for weight loss and blood pressure.  Wilhelmina Mcardle, DNP, AGPCNP-BC Adult Gerontology Primary Care Nurse Practitioner [facility] 01/08/2017, 2:24 PM

## 2017-01-08 NOTE — Assessment & Plan Note (Signed)
Uncontrolled.  Bothersome to pt w/ urticaria.  Previously managed by dermatology.  Triamcinolone cream somewhat effective, but not sufficient for pt.  Plan: 1. Start triamcinolone cream 0.1% - Apply twice daily. 2. Start hydroxyzine 10 mg once daily for itching. 3. Follow up as needed.

## 2017-01-08 NOTE — Patient Instructions (Addendum)
Cherye, Thank you for coming in to clinic today.  1. Continue your food log. - Take this with you to nutrition.  They will need this to help you. - Focus to eat only when you are truly hungry. - START contrave.  Take 1 tablet once daily x 1 week.  Take 1 tablet twice daily x 1 week.  Take 2 tablets in am and one tablet in pm for 1 week.  Then, take two tablets twice daily and continue.  2. For your blood pressure - INCREASE hydrochlorothiazide to 25 mg once daily.  Until you get your new bottle, take 2 tablets. - Continue losartan at 50 mg, but call if you stay higher than 130/80 and we will increase this medicine also.  Please schedule a follow-up appointment with Wilhelmina Mcardle, AGNP. Return in about 3 months (around 04/09/2017) for weight loss and blood pressure.  If you have any other questions or concerns, please feel free to call the clinic or send a message through MyChart. You may also schedule an earlier appointment if necessary.  You will receive a survey after today's visit either digitally by e-mail or paper by [employer]. Your experiences and feedback matter to [place].  Please respond so we know how we are doing as we provide care for you.  Wilhelmina Mcardle, DNP, AGNP-BC Adult Gerontology Nurse Practitioner [facility], [facility]

## 2017-01-08 NOTE — Assessment & Plan Note (Signed)
Remains uncontrolled.  Psoriasis improved, but eczema remains w/o improvement using triamcinolone cream.  Plan: 1. START Eucrisa ointment - apply 2x per day. 2. Follow up as needed.

## 2017-01-08 NOTE — Assessment & Plan Note (Signed)
Morbidly obese and unstable.  Worsening since last visit.  Pt admits dietary indiscretions worst w/ sweets and chips.  Has not continued working w/ food log.  Pt requests to start Contrave.  Plan: 1. Encouraged continued monitoring of diet and overall consumption. 2. Start w/ food logging to establish baseline dietary intake.  Then improve food choices and reduce serving sizes.  Can use MyFitnessPal. 3. Encouraged increased physical activity. 4. Pt can start Contrave.  Discussed may be high cost.  Start w/ one tablet once daily increase one tablet per week until taking 2 tablets twice daily. 5. Placed MNT referral for 3 hours of counseling in divided sessions. 6. Follow up 3 months or sooner if needed.

## 2017-01-08 NOTE — Assessment & Plan Note (Signed)
Remains uncontrolled, but close to goal.  Pt is taking both her losartan and HCTZ w/o side effects  Plan: 1. INCREASE hydrochlorothiazide 25 mg once daily. 2. Continue losartan 50 mg once daily.  Consider combined pill once dosage stable. 3. Check BP at home.  Goal less than 130/80.  Call clinic if greater than 140/90.  4. Follow up 3 months.

## 2017-04-21 ENCOUNTER — Ambulatory Visit: Payer: Medicaid Other | Admitting: Nurse Practitioner

## 2017-05-05 ENCOUNTER — Other Ambulatory Visit: Payer: Self-pay

## 2017-05-05 ENCOUNTER — Ambulatory Visit (INDEPENDENT_AMBULATORY_CARE_PROVIDER_SITE_OTHER): Payer: Self-pay | Admitting: Nurse Practitioner

## 2017-05-05 ENCOUNTER — Encounter: Payer: Self-pay | Admitting: Nurse Practitioner

## 2017-05-05 VITALS — BP 147/68 | HR 69 | Temp 98.0°F | Ht 63.5 in | Wt 357.0 lb

## 2017-05-05 DIAGNOSIS — L308 Other specified dermatitis: Secondary | ICD-10-CM

## 2017-05-05 DIAGNOSIS — L409 Psoriasis, unspecified: Secondary | ICD-10-CM

## 2017-05-05 DIAGNOSIS — I10 Essential (primary) hypertension: Secondary | ICD-10-CM

## 2017-05-05 MED ORDER — TRIAMCINOLONE ACETONIDE 0.5 % EX CREA: 1.0000 "application " | TOPICAL_CREAM | Freq: Three times a day (TID) | CUTANEOUS | 5 refills | Status: AC

## 2017-05-05 MED ORDER — LOSARTAN POTASSIUM-HCTZ 100-25 MG PO TABS: 1.0000 | ORAL_TABLET | Freq: Every day | ORAL | 3 refills | Status: DC

## 2017-05-05 NOTE — Patient Instructions (Addendum)
Liborio NixonJanice, Thank you for coming in to clinic today.  1. Increase to losartan 100 mg once daily.  Continue HCTZ 25 mg once daily.  - When you get a new refill, these will be together in one pill you take once daily.  2. Triamcinolone ointment: apply twice daily for 30 days.  High potency, if you are noticing skin thinning, stop using it and call clinic.  We will send dermatology referral once you have insurance.  Please schedule a follow-up appointment with Wilhelmina McardleLauren Greco Gastelum, AGNP. Return in about 6 months (around 11/02/2017) for blood pressure.  If you have any other questions or concerns, please feel free to call the clinic or send a message through MyChart. You may also schedule an earlier appointment if necessary.  You will receive a survey after today's visit either digitally by e-mail or paper by [employer] mail. Your experiences and feedback matter to us.  Please respond so we know how we are doing as we provide care for you.   Wilhelmina McardleLauren Ajayla Iglesias, DNP, AGNP-BC Adult Gerontology Nurse Practitioner [facility], [facility]

## 2017-05-05 NOTE — Progress Notes (Signed)
Subjective:    Patient ID: Julia DittyJanice G Maloney, female    DOB: 07/05/1965, 52 y.o.   MRN: [id number]  Julia Snow is a 52 y.o. female presenting on 05/05/2017 for Weight Check (blood pressure check, weight management ) and Rash (itchng and dry spots all over the body )   HPI Weight Pt reports increasing her exercise over the last several weeks.  Still not working significantly on diet . She reports not eating out or eating many fried foods.   - She  reports an exercise routine that includes walking and weights, 2-3 days per week. - Her diet is low in salt, moderate in fat, and moderate in carbohydrates.  She admits her portion sizes are still quite large and she eats frequent snacks/sweets.  Hypertension - She is checking BP at home or outside of clinic.  Readings usually 130-140 SBP.  Pt doesn't have any specific readings with her today. - Current medications: hydrochlorothiazide 25 mg once daily and losartan 50 mg once daily, tolerating well without side effects - She is not currently symptomatic. - Pt denies headache, lightheadedness, dizziness, changes in vision, chest tightness/pressure, palpitations, leg swelling, sudden loss of speech or loss of consciousness.  Rash/Eczema Pt reports continuing itching and worsening of her rash on her body.  She has [facility].  Pt has continued to have spread of rash to arms/legs, torso, back and involves most of her skin.  On her back, it is flaky and resembles psoriasis.  On her arms and legs, pt reports it always starts as pustules, then ruptures and creates sores that itch. - She has had dermatologist state she has both ezcema (Body) and psoriasis (Scalp) - Pt reports significant family history of eczema in her sisters, nieces/nephews.   Social History   Tobacco Use  . Smoking status: Never Smoker  . Smokeless tobacco: Never Used  Substance Use Topics  . Alcohol use: Yes    Comment: 1-2 rarely  . Drug use: No      Review of Systems Per HPI unless specifically indicated above     Objective:    BP (!) 147/68 (BP Location: Right Arm, Patient Position: Sitting, Cuff Size: Large)   Pulse 69   Temp 98 F (36.7 C) (Oral)   Ht 5' 3.5" (1.613 m)   Wt (!) 357 lb (161.9 kg)   BMI 62.25 kg/m   Wt Readings from Last 3 Encounters:  05/05/17 (!) 357 lb (161.9 kg)  01/08/17 (!) 355 lb (161 kg)  10/28/16 (!) 350 lb (158.8 kg)    Physical Exam  Constitutional: She is oriented to person, place, and time. She appears well-developed. No distress.  Morbidly obese  HENT:  Head: Normocephalic and atraumatic.  Mouth/Throat: Oropharynx is clear and moist.  Neck: Normal range of motion. Neck supple. No thyromegaly present.  Cardiovascular: Normal rate, regular rhythm, S1 normal, S2 normal, normal heart sounds and intact distal pulses.  Pulmonary/Chest: Effort normal and breath sounds normal. No respiratory distress.  Lymphadenopathy:    She has no cervical adenopathy.  Neurological: She is alert and oriented to person, place, and time.  Skin: Skin is warm and dry. Rash noted.  Diffuse erythematous rash with various stages of healing from pustular, to papular lesions with dry flaky scales.  Rash covers arms, legs, chest, abdomen, and back.  Erythematous plaques with silvery scales on scalp.  Psychiatric: She has a normal mood and affect. Her behavior is normal. Judgment and  thought content normal.  Vitals reviewed.   Results for orders placed or performed in visit on 09/30/16  Lipid Profile  Result Value Ref Range   Cholesterol 124 <200 mg/dL   Triglycerides 69 <161 mg/dL   HDL 48 (L) >09 mg/dL   Total CHOL/HDL Ratio 2.6 <5.0 Ratio   VLDL 14 <30 mg/dL   LDL Cholesterol 62 <604 mg/dL  Comprehensive metabolic panel  Result Value Ref Range   Sodium 140 135 - 146 mmol/L   Potassium 3.9 3.5 - 5.3 mmol/L   Chloride 106 98 - 110 mmol/L   CO2 24 20 - 31 mmol/L   Glucose, Bld 99 65 - 99 mg/dL   BUN 14 7  - 25 mg/dL   Creat 5.40 9.81 - 1.91 mg/dL   Total Bilirubin 0.6 0.2 - 1.2 mg/dL   Alkaline Phosphatase 41 33 - 130 U/L   AST 13 10 - 35 U/L   ALT 12 6 - 29 U/L   Total Protein 5.8 (L) 6.1 - 8.1 g/dL   Albumin 3.6 3.6 - 5.1 g/dL   Calcium 8.7 8.6 - 47.8 mg/dL  TSH  Result Value Ref Range   TSH 2.26 mIU/L  POCT UA - Microalbumin  Result Value Ref Range   Microalbumin Ur, POC 50 mg/L   Creatinine, POC  mg/dL   Albumin/Creatinine Ratio, Urine, POC        Assessment & Plan:   Problem List Items Addressed This Visit      Cardiovascular and Mediastinum   Hypertension    Remains uncontrolled, but close to goal.  Pt is taking both her losartan and HCTZ w/o side effects  Plan: 1. Continue hydrochlorothiazide 25 mg once daily. 2. INCREASE losartan 100 mg once daily.  Consider combined pill once dosage stable. 3. Check BP at home.  Goal less than 130/80.  Call clinic if greater than 140/90.  4. Follow up 6 months.      Relevant Medications   losartan-hydrochlorothiazide (HYZAAR) 100-25 MG tablet     Musculoskeletal and Integument   Psoriasis   Eczema - Primary   Relevant Medications   triamcinolone cream (KENALOG) 0.5 %    Moderate severity rash.  Bothersome symptoms to patient are flakiness of psoriasis and itchiness of both skin eruptions on scalp and body.  Pt has partial control of symptoms with triamcinolone, but is currently out.  Plan: 1. Continue triamcinolone ointment: apply twice daily for 30 days.  High potency, if noticing skin thinning, stop using it and call clinic.   2. Referral to dermatology once pt obtains insurance. Pt declines at this time. 3. Followup as needed.   Meds ordered this encounter  Medications  . triamcinolone cream (KENALOG) 0.5 %    Sig: Apply 1 application topically 3 (three) times daily.    Dispense:  454 g    Refill:  5    Large body surface area affected by eczema.  Please provide 30 day supply and may require 2 jars.    Order  Specific Question:   Supervising Provider    Answer:   Smitty Cords [[id number]]  . losartan-hydrochlorothiazide (HYZAAR) 100-25 MG tablet    Sig: Take 1 tablet by mouth daily.    Dispense:  30 tablet    Refill:  3    Can place order for separate medications if more affordable.  Let me know.    Order Specific Question:   Supervising Provider    Answer:   Smitty Cords [[id number]]  Follow up plan: Return in about 6 months (around 11/02/2017) for blood pressure.  Wilhelmina Mcardle, DNP, AGPCNP-BC Adult Gerontology Primary Care Nurse Practitioner [facility] 06/03/2017, 3:52 PM

## 2017-06-03 ENCOUNTER — Encounter: Payer: Self-pay | Admitting: Nurse Practitioner

## 2017-06-03 NOTE — Assessment & Plan Note (Addendum)
Remains uncontrolled, but close to goal.  Pt is taking both her losartan and HCTZ w/o side effects  Plan: 1. Continue hydrochlorothiazide 25 mg once daily. 2. INCREASE losartan 100 mg once daily.  Consider combined pill once dosage stable. 3. Check BP at home.  Goal less than 130/80.  Call clinic if greater than 140/90.  4. Follow up 6 months.

## 2017-07-17 ENCOUNTER — Encounter: Payer: Self-pay | Admitting: Nurse Practitioner

## 2017-07-20 ENCOUNTER — Telehealth: Payer: Self-pay

## 2017-07-20 NOTE — Telephone Encounter (Signed)
I spoke with the patient concerning the liquid diet. Its to help with weight management.  She states the diet is only for 2 weeks and the plan is to replace (2) of her (3) meals w/ water and boost meal replacement. Please advise

## 2017-07-20 NOTE — Telephone Encounter (Signed)
Meal replacement options for 2 meals will be ok for a short period of time.  Again, pt will need to make sure she is eating a healthy diet when she stops this to maintain any weight loss she is able to achieve.  Caloric intake low limit is 800 calories per day.  Encourage pt to continue to eat a full meal, but not too large, for her 3rd meal and she will meet this goal.

## 2017-07-20 NOTE — Telephone Encounter (Signed)
Replied via telephone encounter.

## 2017-09-24 ENCOUNTER — Telehealth: Payer: Self-pay

## 2017-09-24 NOTE — Telephone Encounter (Signed)
The pt called complaining of leg cramps that wake her up at night. She states she have tried to increase her potassium and still no improvement. I recommended the patient to schedule an appt. She states her all availability is between 1-3 pm. Appt scheduled for June 25th. She was given the options to see another provider sooner, but the pt declined. Pt was instructed to Avoid dehydration. Drink plenty of liquids every day. She informed me that she drinking a lot of water. I recommended drinking maybe one Gatorade G2.

## 2017-10-06 ENCOUNTER — Ambulatory Visit: Payer: Self-pay | Admitting: Nurse Practitioner

## 2017-10-12 ENCOUNTER — Other Ambulatory Visit: Payer: Self-pay | Admitting: Nurse Practitioner

## 2017-10-12 ENCOUNTER — Other Ambulatory Visit: Payer: Self-pay

## 2017-10-12 ENCOUNTER — Encounter: Payer: Self-pay | Admitting: Nurse Practitioner

## 2017-10-12 ENCOUNTER — Ambulatory Visit (INDEPENDENT_AMBULATORY_CARE_PROVIDER_SITE_OTHER): Payer: Self-pay | Admitting: Nurse Practitioner

## 2017-10-12 VITALS — BP 151/77 | HR 68 | Temp 98.6°F | Ht 63.5 in | Wt 365.5 lb

## 2017-10-12 DIAGNOSIS — I10 Essential (primary) hypertension: Secondary | ICD-10-CM

## 2017-10-12 DIAGNOSIS — R252 Cramp and spasm: Secondary | ICD-10-CM

## 2017-10-12 DIAGNOSIS — Z0000 Encounter for general adult medical examination without abnormal findings: Secondary | ICD-10-CM

## 2017-10-12 DIAGNOSIS — Z6841 Body Mass Index (BMI) 40.0 and over, adult: Secondary | ICD-10-CM

## 2017-10-12 MED ORDER — AMLODIPINE BESYLATE 5 MG PO TABS: 5.0000 mg | ORAL_TABLET | Freq: Every day | ORAL | 3 refills | Status: DC

## 2017-10-12 MED ORDER — LOSARTAN POTASSIUM 100 MG PO TABS: 100.0000 mg | ORAL_TABLET | Freq: Every day | ORAL | 1 refills | Status: DC

## 2017-10-12 MED ORDER — HYDROCHLOROTHIAZIDE 25 MG PO TABS: 25.0000 mg | ORAL_TABLET | Freq: Every day | ORAL | 1 refills | Status: DC

## 2017-10-12 NOTE — Assessment & Plan Note (Signed)
Morbidly obese and worsening since last visit.  Pt admits dietary indiscretions, but has reduced sweets compared to in past.  She is limited by convenience to fast food several times per week and financially to all other food options at times.  Plan: 1. Encouraged continued monitoring of diet and overall consumption. 2. Continue work to reduce portion sizes. 3. Encouraged increased physical activity. 4. May consider bariatric referral in future.  No insurance coverage at this time.  5. Follow up 3 months or sooner if needed.

## 2017-10-12 NOTE — Progress Notes (Signed)
Subjective:    Patient ID: Julia Snow, female    DOB: Aug 08, 1965, 52 y.o.   MRN: [id number]  Julia Snow is a 52 y.o. female presenting on 10/12/2017 for Obesity (weight management) and Leg Pain (bilateral leg and fingers cramping )   HPI Morbid Obesity Has been working to reduce salt, some work for reduction of calories overall, but not successful with weight loss.  Is limited by finances, convenience.  She relies on fast food meals at least once per week. - Is working 3 jobs, no regular exercise.  Patient is having difficulty finding a gym with hours available after she gets off work.  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  Hypertension - She is not checking BP at home or outside of clinic.    - Current medications: losartan 100 mg once daily, hydrochlorothiazide 25 mg once daily (separate pills for cost), tolerating well without side effects - She is not currently symptomatic. - Pt denies headache, lightheadedness, dizziness, changes in vision, chest tightness/pressure, palpitations, leg swelling, sudden loss of speech or loss of consciousness. - Her diet is high in salt, high in fat, and high in carbohydrates, although she has reduced some sugars and calories overall.  Leg Pain/Cramps Patient reports intermittent cramping of muscles.  She has cramping in bilateral legs and fingers affecting thighs, hamstrings, front of shins, and fingers.  Not all locations cramp at the same time or recur daily.  Cramps occur at night occasionally and wake from sleep.  Other times, during day.  No predictable pattern.  Patient states she has increased potassium occasionally, and believes this has made her cramps worse.  She has also increased salt intake at times to prevent cramps without relief.  Social History   Tobacco Use  . Smoking status: Never Smoker  . Smokeless tobacco: Never Used  Substance Use Topics  . Alcohol use: Yes   Comment: 1-2 rarely  . Drug use: No    Review of Systems Per HPI unless specifically indicated above     Objective:    BP (!) 151/77 (BP Location: Right Arm, Patient Position: Sitting, Cuff Size: Large)   Pulse 68   Temp 98.6 F (37 C) (Oral)   Ht 5' 3.5" (1.613 m)   Wt (!) 365 lb 8 oz (165.8 kg)   BMI 63.73 kg/m   Wt Readings from Last 3 Encounters:  10/12/17 (!) 365 lb 8 oz (165.8 kg)  05/05/17 (!) 357 lb (161.9 kg)  01/08/17 (!) 355 lb (161 kg)    Physical Exam  Constitutional: She is oriented to person, place, and time. She appears well-developed and well-nourished. No distress.  HENT:  Head: Normocephalic and atraumatic.  Cardiovascular: Normal rate, regular rhythm, S1 normal, S2 normal and intact distal pulses. Exam reveals distant heart sounds (due to body habitus). Exam reveals no gallop and no friction rub.  No murmur heard. Pulmonary/Chest: Effort normal and breath sounds normal. No respiratory distress.  Abdominal: Bowel sounds are normal.  Neurological: She is alert and oriented to person, place, and time.  Skin: Skin is warm and dry. Capillary refill takes less than 2 seconds. Rash (improved erythematous, flaky rash on back, resolved on arms, scarring remains on legs bilaterally) noted.  Psychiatric: She has a normal mood and affect. Her behavior is normal. Judgment and thought content normal.  Vitals reviewed.   Results for orders placed or performed in visit on 09/30/16  Lipid Profile  Result Value Ref Range   Cholesterol 124 <200 mg/dL   Triglycerides 69 <161 mg/dL   HDL 48 (L) >09 mg/dL   Total CHOL/HDL Ratio 2.6 <5.0 Ratio   VLDL 14 <30 mg/dL   LDL Cholesterol 62 <604 mg/dL  Comprehensive metabolic panel  Result Value Ref Range   Sodium 140 135 - 146 mmol/L   Potassium 3.9 3.5 - 5.3 mmol/L   Chloride 106 98 - 110 mmol/L   CO2 24 20 - 31 mmol/L   Glucose, Bld 99 65 - 99 mg/dL   BUN 14 7 - 25 mg/dL   Creat 5.40 9.81 - 1.91 mg/dL   Total Bilirubin  0.6 0.2 - 1.2 mg/dL   Alkaline Phosphatase 41 33 - 130 U/L   AST 13 10 - 35 U/L   ALT 12 6 - 29 U/L   Total Protein 5.8 (L) 6.1 - 8.1 g/dL   Albumin 3.6 3.6 - 5.1 g/dL   Calcium 8.7 8.6 - 47.8 mg/dL  TSH  Result Value Ref Range   TSH 2.26 mIU/L  POCT UA - Microalbumin  Result Value Ref Range   Microalbumin Ur, POC 50 mg/L   Creatinine, POC  mg/dL   Albumin/Creatinine Ratio, Urine, POC        Assessment & Plan:   Problem List Items Addressed This Visit      Cardiovascular and Mediastinum   Hypertension   Relevant Medications   amLODipine (NORVASC) 5 MG tablet   losartan (COZAAR) 100 MG tablet   hydrochlorothiazide (HYDRODIURIL) 25 MG tablet   Other Relevant Orders   COMPLETE METABOLIC PANEL WITH GFR     Other   BMI 60.0-69.9, adult (HCC) - Primary    Morbidly obese and worsening since last visit.  Pt admits dietary indiscretions, but has reduced sweets compared to in past.  She is limited by convenience to fast food several times per week and financially to all other food options at times.  Plan: 1. Encouraged continued monitoring of diet and overall consumption. 2. Continue work to reduce portion sizes. 3. Encouraged increased physical activity. 4. May consider bariatric referral in future.  No insurance coverage at this time.  5. Follow up 3 months or sooner if needed.       Other Visit Diagnoses    Leg cramps       Relevant Orders   COMPLETE METABOLIC PANEL WITH GFR   Magnesium    Acute to subacute leg cramping.  Patient without identified cause or predictable nature.  Is possibly related to overuse/fatigue or electrolyte imbalances. - Labs tomorrow for electrolytes. - Continue staying well hydrated. - Follow-up as needed and after labs.  Meds ordered this encounter  Medications  . amLODipine (NORVASC) 5 MG tablet    Sig: Take 1 tablet (5 mg total) by mouth daily.    Dispense:  90 tablet    Refill:  3    Order Specific Question:   Supervising Provider     Answer:   Smitty Cords [[id number]]  . losartan (COZAAR) 100 MG tablet    Sig: Take 1 tablet (100 mg total) by mouth daily.    Dispense:  90 tablet    Refill:  1    Order Specific Question:   Supervising Provider    Answer:   Smitty Cords [[id number]]  . hydrochlorothiazide (HYDRODIURIL) 25 MG tablet    Sig: Take 1 tablet (25 mg total) by mouth daily.    Dispense:  90  tablet    Refill:  1    Order Specific Question:   Supervising Provider    Answer:   Smitty CordsKARAMALEGOS, ALEXANDER J [[id number]]    Follow up plan: Return in about 1 week (around 10/19/2017) for annual physical with PAP.  Wilhelmina McardleLauren Mrytle Bento, DNP, AGPCNP-BC Adult Gerontology Primary Care Nurse Practitioner [facility] 10/12/2017, 2:07 PM

## 2017-10-12 NOTE — Patient Instructions (Addendum)
Julia DittyJanice G Snow,   Thank you for coming in to clinic today.  1. Continue losartan and hydrochlorothiazide. - START amlodipine 5 mg once daily.  2. Continue working to increase exercise and reduce overall food intake.    3. Labs in the morning for electrolytes,  CMP, magnesium.  You will be due for FASTING BLOOD WORK.  This means you should eat no food or drink after midnight.  Drink only water or coffee without cream/sugar on the morning of your lab visit. - Please go ahead and schedule a "Lab Only" visit in the morning at the clinic for lab draw in the next 7 days. - Your results will be available about 2-3 days after blood draw.  If you have set up a MyChart account, you can can log in to MyChart online to view your results and a brief explanation. Also, we can discuss your results together at your next office visit if you would like.    Please schedule a follow-up appointment with Wilhelmina McardleLauren Tenicia Snow, AGNP. Return in about 1 week (around 10/19/2017) for annual physical with PAP.  If you have any other questions or concerns, please feel free to call the clinic or send a message through MyChart. You may also schedule an earlier appointment if necessary.  You will receive a survey after today's visit either digitally by e-mail or paper by [employer] mail. Your experiences and feedback matter to us.  Please respond so we know how we are doing as we provide care for you.   Wilhelmina McardleLauren Osker Ayoub, DNP, AGNP-BC Adult Gerontology Nurse Practitioner [facility], [facility]

## 2017-10-13 ENCOUNTER — Other Ambulatory Visit: Payer: Medicaid Other

## 2017-10-14 LAB — COMPLETE METABOLIC PANEL WITH GFR
AG Ratio: 1.4 (calc) (ref 1.0–2.5)
ALT: 12 U/L (ref 6–29)
AST: 11 U/L (ref 10–35)
Albumin: 3.7 g/dL (ref 3.6–5.1)
Alkaline phosphatase (APISO): 51 U/L (ref 33–130)
BUN/Creatinine Ratio: 16 (calc) (ref 6–22)
BUN: 19 mg/dL (ref 7–25)
CO2: 27 mmol/L (ref 20–32)
Calcium: 9.2 mg/dL (ref 8.6–10.4)
Chloride: 105 mmol/L (ref 98–110)
Creat: 1.18 mg/dL — ABNORMAL HIGH (ref 0.50–1.05)
GFR, Est African American: 62 mL/min/{1.73_m2} (ref >=60)
GFR, Est Non African American: 53 mL/min/{1.73_m2} — ABNORMAL LOW (ref >=60)
Globulin: 2.6 g/dL (calc) (ref 1.9–3.7)
Glucose, Bld: 150 mg/dL — ABNORMAL HIGH (ref 65–99)
Potassium: 4.7 mmol/L (ref 3.5–5.3)
Sodium: 140 mmol/L (ref 135–146)
Total Bilirubin: 0.5 mg/dL (ref 0.2–1.2)
Total Protein: 6.3 g/dL (ref 6.1–8.1)

## 2017-10-14 LAB — LIPID PANEL
Cholesterol: 136 mg/dL (ref <200)
HDL: 45 mg/dL — ABNORMAL LOW (ref >50)
LDL Cholesterol (Calc): 75 mg/dL (calc)
Non-HDL Cholesterol (Calc): 91 mg/dL (calc) (ref <130)
Total CHOL/HDL Ratio: 3 (calc) (ref <5.0)
Triglycerides: 79 mg/dL (ref <150)

## 2017-10-14 LAB — HEMOGLOBIN A1C
Hgb A1c MFr Bld: 6.9 % of total Hgb — ABNORMAL HIGH (ref <5.7)
Mean Plasma Glucose: 151 (calc)
eAG (mmol/L): 8.4 (calc)

## 2017-10-14 LAB — CBC WITH DIFFERENTIAL/PLATELET
Basophils Absolute: 30 cells/uL (ref 0–200)
Basophils Relative: 0.5 %
Eosinophils Absolute: 177 cells/uL (ref 15–500)
Eosinophils Relative: 3 %
HCT: 38.7 % (ref 35.0–45.0)
Hemoglobin: 12.5 g/dL (ref 11.7–15.5)
Lymphs Abs: 1611 cells/uL (ref 850–3900)
MCH: 28 pg (ref 27.0–33.0)
MCHC: 32.3 g/dL (ref 32.0–36.0)
MCV: 86.8 fL (ref 80.0–100.0)
MPV: 11 fL (ref 7.5–12.5)
Monocytes Relative: 8.1 %
Neutro Abs: 3605 cells/uL (ref 1500–7800)
Neutrophils Relative %: 61.1 %
Platelets: 253 10*3/uL (ref 140–400)
RBC: 4.46 10*6/uL (ref 3.80–5.10)
RDW: 14.4 % (ref 11.0–15.0)
Total Lymphocyte: 27.3 %
WBC mixed population: 478 cells/uL (ref 200–950)
WBC: 5.9 10*3/uL (ref 3.8–10.8)

## 2017-10-14 LAB — MAGNESIUM: Magnesium: 2.2 mg/dL (ref 1.5–2.5)

## 2017-10-14 LAB — TSH: TSH: 1.71 mIU/L

## 2017-10-19 ENCOUNTER — Ambulatory Visit (INDEPENDENT_AMBULATORY_CARE_PROVIDER_SITE_OTHER): Payer: Medicaid Other | Admitting: Nurse Practitioner

## 2017-10-19 ENCOUNTER — Other Ambulatory Visit: Payer: Self-pay

## 2017-10-19 ENCOUNTER — Encounter: Payer: Self-pay | Admitting: Nurse Practitioner

## 2017-10-19 ENCOUNTER — Other Ambulatory Visit (HOSPITAL_COMMUNITY)
Admission: RE | Admit: 2017-10-19 | Discharge: 2017-10-19 | Disposition: A | Discharge status: Home / Self Care | Payer: Medicaid Other | Source: Ambulatory Visit | Attending: Nurse Practitioner | Admitting: Nurse Practitioner

## 2017-10-19 VITALS — BP 134/75 | HR 70 | Temp 98.4°F | Ht 63.5 in | Wt 364.0 lb

## 2017-10-19 DIAGNOSIS — Z124 Encounter for screening for malignant neoplasm of cervix: Secondary | ICD-10-CM

## 2017-10-19 DIAGNOSIS — E1165 Type 2 diabetes mellitus with hyperglycemia: Secondary | ICD-10-CM | POA: Diagnosis not present

## 2017-10-19 DIAGNOSIS — Z0000 Encounter for general adult medical examination without abnormal findings: Secondary | ICD-10-CM

## 2017-10-19 DIAGNOSIS — Z1239 Encounter for other screening for malignant neoplasm of breast: Secondary | ICD-10-CM

## 2017-10-19 DIAGNOSIS — Z1211 Encounter for screening for malignant neoplasm of colon: Secondary | ICD-10-CM | POA: Diagnosis not present

## 2017-10-19 DIAGNOSIS — Z3009 Encounter for other general counseling and advice on contraception: Secondary | ICD-10-CM

## 2017-10-19 DIAGNOSIS — Z1231 Encounter for screening mammogram for malignant neoplasm of breast: Secondary | ICD-10-CM | POA: Diagnosis not present

## 2017-10-19 MED ORDER — SIMVASTATIN 20 MG PO TABS: 20.0000 mg | ORAL_TABLET | Freq: Every day | ORAL | 0 refills | Status: DC

## 2017-10-19 MED ORDER — METFORMIN HCL 500 MG PO TABS: 500.0000 mg | ORAL_TABLET | Freq: Two times a day (BID) | ORAL | 3 refills | Status: DC

## 2017-10-19 NOTE — Patient Instructions (Addendum)
Julia Snow,   Thank you for coming in to clinic today.  1. For diabetes: - START metformin 500 mg one tablet once daily.  After 2 weeks, increase to twice daily with meals. - START simvastatin 20 mg once daily to prevent heart attack and stroke.  2. Your mammogram order has been placed.  Call the Scheduling phone number at [phone] to schedule your mammogram at your convenience.  You can choose to go to either location listed below.  Let the scheduler know which location you prefer.  [place]  [place], [place] [address]   [facility] [address] [place], [place] [address]   3. Colon Cancer Screening: - For all adults age 51 and older, routine colon cancer screening is highly recommended. - Early detection of colon cancer is important, because often there are no warning signs or symptoms.  If colon cancer is found early, usually it can be cured. Advanced cancer is hard to treat.  - If you are not interested in Colonoscopy screening (if done and normal you could be cleared for 5 to 10 years until next due), then Cologuard is an excellent alternative for screening test for Colon Cancer. It is highly sensitive for detecting DNA of colon cancer from even the earliest stages. Also, there is NO bowel prep required. - If Cologuard is NEGATIVE, then it is good for 3 years before next due - If Cologuard is POSITIVE, then it is strongly advised to get a Colonoscopy, which allows the GI doctor to locate the source of the cancer or polyp (even very early stage) and treat it by removing it. ------------------------- If you would like to proceed with Cologuard (stool DNA test) - FIRST, call your insurance company and tell them you want to check cost of Cologuard tell them CPT Code [phone] (it may be completely covered with a small or no cost, OR max cost without any coverage is  about $600). If you do NOT open the kit, and decide not to do the test, you will NOT be charged, you should contact the company to return the kit if you decide not to do the test. - If you want to proceed, you can notify us (office phone, [place], or at next visit) and we will order it for you. The test kit will be delivered to your house in about 1 week. Follow instructions to collect your stool sample.  You may call the company for any help or questions, 24/7 telephone support at [phone].   Please schedule a follow-up appointment with Cassell Smiles, AGNP. Return in about 1 year (around 10/20/2018) for annual physical AND in 3 months for hypertension, diabetes.  If you have any other questions or concerns, please feel free to call the clinic or send a message through [place]. You may also schedule an earlier appointment if necessary.  You will receive a survey after today's visit either digitally by e-mail or paper by [employer]. Your experiences and feedback matter to [place].  Please respond so we know how we are doing as we provide care for you.   Cassell Smiles, DNP, AGNP-BC Adult Gerontology Nurse Practitioner [place]

## 2017-10-19 NOTE — Progress Notes (Signed)
Subjective:    Patient ID: Julia Snow, female    DOB: 10/07/1965, 52 y.o.   MRN: [id number]  Julia Snow is a 52 y.o. female presenting on 10/19/2017 for Annual Exam   HPI Annual Physical Exam Patient has been feeling well.  They have no acute concerns today. Sleeps 6-8 hours per night interrupted.  HEALTH MAINTENANCE: Weight/BMI: stable, slight decreased over last 6 months. Physical activity: regular, housework Diet: improving slowly Seatbelt: always Sunscreen: never PAP: due Mammogram: due  Colon Cancer Screen: due HIV/HEP C: negative in past Optometry: regular Dentistry: yearly  VACCINES: Tetanus: administered at plasma donation center Pneumonia: due - declines  Family Planning Patient's last menstrual period was 08/19/2017. Within weeks. She would like to have a child, but has previously been unable to have a healthy, completed pregnancy.  She is not currently sexually active and is not currently preventing pregnancy.   Diabetes - new diagnosis Pt presents today for follow up of Type 2 diabetes mellitus after initial diagnosis with pre-physical labs. She is not checking CBG at home - Current diabetic medications include: none - She is not currently symptomatic.  - She denies polydipsia, polyphagia, polyuria, headaches, diaphoresis, shakiness, chills, pain, numbness or tingling in extremities and changes in vision.   - Patient reports extensive family history of diabetes.  PREVENTION: Eye exam current (within one year): no Foot exam current (within one year): no Lipid/ASCVD risk reduction - on statin: no Kidney protection - on ace or arb: no Recent Labs    10/13/17 0000  HGBA1C 6.9*    Past Medical History:  Diagnosis Date  . Allergy   . Hypertension   . Sleep apnea    does not wear CPAP   Past Surgical History:  Procedure Laterality Date  . DILATION AND CURETTAGE OF UTERUS  2005  . endometrial surgery  1995   Social History   Socioeconomic  History  . Marital status: Married    Spouse name: Not on file  . Number of children: Not on file  . Years of education: Not on file  . Highest education level: Not on file  Occupational History  . Not on file  Social Needs  . Financial resource strain: Not on file  . Food insecurity:    Worry: Not on file    Inability: Not on file  . Transportation needs:    Medical: Not on file    Non-medical: Not on file  Tobacco Use  . Smoking status: Never Smoker  . Smokeless tobacco: Never Used  Substance and Sexual Activity  . Alcohol use: Yes    Comment: 1-2 rarely  . Drug use: No  . Sexual activity: Never    Comment: mutually monogamous relationship  Lifestyle  . Physical activity:    Days per week: Not on file    Minutes per session: Not on file  . Stress: Not on file  Relationships  . Social connections:    Talks on phone: Not on file    Gets together: Not on file    Attends religious service: Not on file    Active member of club or organization: Not on file    Attends meetings of clubs or organizations: Not on file    Relationship status: Not on file  . Intimate partner violence:    Fear of current or ex partner: Not on file    Emotionally abused: Not on file    Physically abused: Not on file  Forced sexual activity: Not on file  Other Topics Concern  . Not on file  Social History Narrative  . Not on file   Family History  Problem Relation Age of Onset  . Hypertension Mother   . Diabetes Father   . Prostate cancer Father   . Diabetes Sister   . Stroke Paternal Grandmother   . Osteoarthritis Sister   . Breast cancer Maternal Aunt   . Colon cancer Neg Hx   . Ovarian cancer Neg Hx    Current Outpatient Medications on File Prior to Visit  Medication Sig  . Biotin 40981 MCG TABS Take by mouth.  . ferrous sulfate 325 (65 FE) MG EC tablet Take 325 mg by mouth 3 (three) times daily with meals.  . hydrochlorothiazide (HYDRODIURIL) 25 MG tablet Take 1 tablet (25 mg  total) by mouth daily.  Marland Kitchen losartan (COZAAR) 100 MG tablet Take 1 tablet (100 mg total) by mouth daily.  . Multiple Vitamin (MULTIVITAMIN) tablet Take 1 tablet by mouth daily.  . Omega-3 Fatty Acids (FISH OIL) 1000 MG CAPS Take 1 capsule (1,000 mg total) by mouth daily.  Marland Kitchen triamcinolone cream (KENALOG) 0.5 % Apply 1 application topically 3 (three) times daily.  . vitamin E 1000 UNIT capsule Take 1,000 Units by mouth daily.  Marland Kitchen amLODipine (NORVASC) 5 MG tablet Take 1 tablet (5 mg total) by mouth daily. (Patient not taking: Reported on 10/19/2017)   No current facility-administered medications on file prior to visit.     Review of Systems Per HPI unless specifically indicated above     Objective:    BP 134/75 (BP Location: Right Arm, Patient Position: Sitting, Cuff Size: Large)   Pulse 70   Temp 98.4 F (36.9 C) (Oral)   Ht 5' 3.5" (1.613 m)   Wt (!) 364 lb (165.1 kg)   BMI 63.47 kg/m   Wt Readings from Last 3 Encounters:  10/19/17 (!) 364 lb (165.1 kg)  10/12/17 (!) 365 lb 8 oz (165.8 kg)  05/05/17 (!) 357 lb (161.9 kg)    Physical Exam  Constitutional: She is oriented to person, place, and time. She appears well-developed and well-nourished. No distress.  HENT:  Head: Normocephalic and atraumatic.  Right Ear: External ear normal.  Left Ear: External ear normal.  Nose: Nose normal.  Mouth/Throat: Oropharynx is clear and moist.  Eyes: Pupils are equal, round, and reactive to light. Conjunctivae are normal.  Neck: Normal range of motion. Neck supple. No JVD present. No tracheal deviation present. No thyromegaly present.  Cardiovascular: Normal rate, regular rhythm, normal heart sounds and intact distal pulses. Exam reveals no gallop and no friction rub.  No murmur heard. Pulmonary/Chest: Effort normal and breath sounds normal. No respiratory distress.  Breast - Normal exam w/ symmetric breasts, no mass, no nipple discharge, no skin changes or tenderness.   Abdominal: Soft. Bowel  sounds are normal. She exhibits no distension. There is no hepatosplenomegaly. There is no tenderness.  Genitourinary:  Genitourinary Comments: Normal external female genitalia without lesions or fusion. Vaginal canal without lesions. Normal appearing cervix without lesions or friability. Physiologic discharge on exam. Bimanual exam without adnexal masses, enlarged uterus, or cervical motion tenderness.  Pelvic Exam chaperoned and positioning assistance by Laurel Dimmer, CMA  Musculoskeletal: Normal range of motion.  Lymphadenopathy:    She has no cervical adenopathy.  Neurological: She is alert and oriented to person, place, and time. No cranial nerve deficit.  Skin: Skin is warm and dry. Capillary refill takes  less than 2 seconds.  Psychiatric: She has a normal mood and affect. Her behavior is normal. Judgment and thought content normal.  Nursing note and vitals reviewed.  Results for orders placed or performed in visit on 10/12/17  TSH  Result Value Ref Range   TSH 1.71 mIU/L  CBC with Differential/Platelet  Result Value Ref Range   WBC 5.9 3.8 - 10.8 Thousand/uL   RBC 4.46 3.80 - 5.10 Million/uL   Hemoglobin 12.5 11.7 - 15.5 g/dL   HCT 40.9 81.1 - 91.4 %   MCV 86.8 80.0 - 100.0 fL   MCH 28.0 27.0 - 33.0 pg   MCHC 32.3 32.0 - 36.0 g/dL   RDW 78.2 95.6 - 21.3 %   Platelets 253 140 - 400 Thousand/uL   MPV 11.0 7.5 - 12.5 fL   Neutro Abs 3,605 1,500 - 7,800 cells/uL   Lymphs Abs 1,611 850 - 3,900 cells/uL   WBC mixed population 478 200 - 950 cells/uL   Eosinophils Absolute 177 15 - 500 cells/uL   Basophils Absolute 30 0 - 200 cells/uL   Neutrophils Relative % 61.1 %   Total Lymphocyte 27.3 %   Monocytes Relative 8.1 %   Eosinophils Relative 3.0 %   Basophils Relative 0.5 %  Lipid panel  Result Value Ref Range   Cholesterol 136 <200 mg/dL   HDL 45 (L) >08 mg/dL   Triglycerides 79 <657 mg/dL   LDL Cholesterol (Calc) 75 mg/dL (calc)   Total CHOL/HDL Ratio 3.0 <5.0 (calc)    Non-HDL Cholesterol (Calc) 91 <846 mg/dL (calc)  Hemoglobin N6E  Result Value Ref Range   Hgb A1c MFr Bld 6.9 (H) <5.7 % of total Hgb   Mean Plasma Glucose 151 (calc)   eAG (mmol/L) 8.4 (calc)  COMPLETE METABOLIC PANEL WITH GFR  Result Value Ref Range   Glucose, Bld 150 (H) 65 - 99 mg/dL   BUN 19 7 - 25 mg/dL   Creat 9.52 (H) 8.41 - 1.05 mg/dL   GFR, Est Non African American 53 (L) > OR = 60 mL/min/1.1m2   GFR, Est African American 62 > OR = 60 mL/min/1.53m2   BUN/Creatinine Ratio 16 6 - 22 (calc)   Sodium 140 135 - 146 mmol/L   Potassium 4.7 3.5 - 5.3 mmol/L   Chloride 105 98 - 110 mmol/L   CO2 27 20 - 32 mmol/L   Calcium 9.2 8.6 - 10.4 mg/dL   Total Protein 6.3 6.1 - 8.1 g/dL   Albumin 3.7 3.6 - 5.1 g/dL   Globulin 2.6 1.9 - 3.7 g/dL (calc)   AG Ratio 1.4 1.0 - 2.5 (calc)   Total Bilirubin 0.5 0.2 - 1.2 mg/dL   Alkaline phosphatase (APISO) 51 33 - 130 U/L   AST 11 10 - 35 U/L   ALT 12 6 - 29 U/L  Magnesium  Result Value Ref Range   Magnesium 2.2 1.5 - 2.5 mg/dL      Assessment & Plan:   Problem List Items Addressed This Visit    None    Visit Diagnoses    Physical exam, annual    -  Primary   Encounter for female family planning counseling       Type 2 diabetes mellitus with hyperglycemia, without long-term current use of insulin (HCC)       Relevant Medications   metFORMIN (GLUCOPHAGE) 500 MG tablet   simvastatin (ZOCOR) 20 MG tablet   Breast cancer screening       Relevant  Orders   MM DIGITAL SCREENING BILATERAL   Colon cancer screening       Relevant Orders   Cologuard   Cervical cancer screening       Relevant Orders   Cytology - PAP      # Physical exam with new findings of type 2 DM on pre-visit labs.  Well adult with no other acute concerns.  Plan: 1. Obtain health maintenance screenings as above according to age. - After discussion of options, cologuard preferred by patient for colon cancer screen over colonoscopy.  Order placed. - PAP smear  performed today.   - Increase physical activity to 30 minutes most days of the week.  - Eat healthy diet high in vegetables and fruits; low in refined carbohydrates. - Family planning discussion with high likelihood of perimenopausal status and likely inability to become pregnant.  Patient has not been sexually active since LMP, so pregnancy testing was deferred by patient and provider.  Patient would still like to be a mother, however.  Discussed alternative options such as adoption or fostering.  Also discussed medically with T2DM, hypertension, morbid obesity, and advanced age that any pregnancy would be high risk.  Further discussion about pregnancy can be done with OB-GYN provider if patient desires. 2. Return 1 year for annual physical.  # T2DM Controlled, new diagnosis T2DM with A1c 6.9% and goal A1c < 7.0%. - Complications - hyperglycemia.  Plan:  1. Start metformin 500 mg tab one daily.  Increase to bidwc after 2 weeks 2. Encourage improved lifestyle: - low carb/low glycemic diet handout provided - Increase physical activity to 30 minutes most days of the week.  Explained that increased physical activity increases body's use of sugar for energy. 3. Check fasting am CBG and bring log to next visit for review 4. START simvastatin 20 mg daily (lower dose for concurrent administration with amlodipine and CYP interactions) Continue ASA and ARB 5. Advised to schedule DM ophtho exam, send record. 6. Follow-up 3 months   Meds ordered this encounter  Medications  . metFORMIN (GLUCOPHAGE) 500 MG tablet    Sig: Take 1 tablet (500 mg total) by mouth 2 (two) times daily with a meal.    Dispense:  180 tablet    Refill:  3    Order Specific Question:   Supervising Provider    Answer:   Smitty Cords [[id number]]  . simvastatin (ZOCOR) 20 MG tablet    Sig: Take 1 tablet (20 mg total) by mouth at bedtime.    Dispense:  90 tablet    Refill:  0    Order Specific Question:   Supervising  Provider    Answer:   Smitty Cords [[id number]]    Follow up plan: Return in about 1 year (around 10/20/2018) for annual physical AND in 3 months for hypertension, diabetes.  Wilhelmina Mcardle, DNP, AGPCNP-BC Adult Gerontology Primary Care Nurse Practitioner [facility] 10/19/2017, 9:53 AM

## 2017-10-21 LAB — CYTOLOGY - PAP
Diagnosis: NEGATIVE
HPV: NOT DETECTED

## 2017-10-23 ENCOUNTER — Encounter: Payer: Self-pay | Admitting: Nurse Practitioner

## 2017-10-23 ENCOUNTER — Telehealth: Payer: Self-pay | Admitting: Nurse Practitioner

## 2017-10-23 NOTE — Telephone Encounter (Signed)
Pt started taking metformin on Wednesday.  Her blood sugar yesterday morning was 150 and after eating it was 180.  These readings are higher than before taking metformin [id number]

## 2017-10-23 NOTE — Telephone Encounter (Signed)
Patient requested reply and responded by MyChart

## 2017-11-12 ENCOUNTER — Telehealth: Payer: Self-pay | Admitting: Nurse Practitioner

## 2017-11-12 NOTE — Telephone Encounter (Signed)
Pt said last tetanus was in April 2019.

## 2017-11-12 NOTE — Telephone Encounter (Signed)
The pt was notified that she have to bring documentation to prove immunization before we can document it in the pt chart. She verbalize understanding.

## 2017-12-14 ENCOUNTER — Emergency Department: Payer: Medicaid Other

## 2017-12-14 ENCOUNTER — Encounter: Payer: Self-pay | Admitting: Emergency Medicine

## 2017-12-14 ENCOUNTER — Other Ambulatory Visit: Payer: Self-pay

## 2017-12-14 ENCOUNTER — Emergency Department
Admission: EM | Admit: 2017-12-14 | Discharge: 2017-12-14 | Disposition: A | Discharge status: Home / Self Care | Payer: Medicaid Other | Attending: Emergency Medicine | Admitting: Emergency Medicine

## 2017-12-14 DIAGNOSIS — B029 Zoster without complications: Secondary | ICD-10-CM | POA: Insufficient documentation

## 2017-12-14 DIAGNOSIS — I10 Essential (primary) hypertension: Secondary | ICD-10-CM | POA: Insufficient documentation

## 2017-12-14 DIAGNOSIS — Z79899 Other long term (current) drug therapy: Secondary | ICD-10-CM | POA: Insufficient documentation

## 2017-12-14 LAB — COMPREHENSIVE METABOLIC PANEL
ALK PHOS: 39 U/L (ref 38–126)
ALT: 22 U/L (ref 0–44)
ANION GAP: 8 (ref 5–15)
AST: 24 U/L (ref 15–41)
Albumin: 3.4 g/dL — ABNORMAL LOW (ref 3.5–5.0)
BILIRUBIN TOTAL: 0.9 mg/dL (ref 0.3–1.2)
BUN: 13 mg/dL (ref 6–20)
CALCIUM: 8.7 mg/dL — AB (ref 8.9–10.3)
CO2: 27 mmol/L (ref 22–32)
Chloride: 105 mmol/L (ref 98–111)
Creatinine, Ser: 0.99 mg/dL (ref 0.44–1.00)
GFR calc Af Amer: >60 mL/min (ref >60)
GFR calc non Af Amer: >60 mL/min (ref >60)
Glucose, Bld: 136 mg/dL — ABNORMAL HIGH (ref 70–99)
Potassium: 4.2 mmol/L (ref 3.5–5.1)
Sodium: 140 mmol/L (ref 135–145)
TOTAL PROTEIN: 6.1 g/dL — AB (ref 6.5–8.1)

## 2017-12-14 LAB — CBC
HEMATOCRIT: 37.3 % (ref 35.0–47.0)
HEMOGLOBIN: 12.7 g/dL (ref 12.0–16.0)
MCH: 30 pg (ref 26.0–34.0)
MCHC: 34 g/dL (ref 32.0–36.0)
MCV: 88.4 fL (ref 80.0–100.0)
Platelets: 229 10*3/uL (ref 150–440)
RBC: 4.22 MIL/uL (ref 3.80–5.20)
RDW: 15.2 % — ABNORMAL HIGH (ref 11.5–14.5)
WBC: 6.2 10*3/uL (ref 3.6–11.0)

## 2017-12-14 LAB — TROPONIN I: Troponin I: <0.03 ng/mL (ref <0.03)

## 2017-12-14 MED ORDER — ACYCLOVIR 400 MG PO TABS: 800.0000 mg | ORAL_TABLET | Freq: Every day | ORAL | 0 refills | Status: AC

## 2017-12-14 NOTE — ED Triage Notes (Signed)
Patient states chest pain x 1 week. When questioned if is on surface or inside her chest states both. Noted to have blistery rash L back and chest.

## 2017-12-14 NOTE — ED Provider Notes (Signed)
[facility] Emergency Department Provider Note ____________________________________________   I have reviewed the triage vital signs and the triage nursing note.  HISTORY  Chief Complaint Chest Pain   Historian Patient  HPI Julia Snow is a 52 y.o. female presents for left-sided chest discomfort feels it through to the back.  She also has a blistering rash there both on her back on the left side as well as the front on the left side.  Symptoms started about a week ago.  The rash started after that.  She has had shingles in the past about 10 years ago.  No shortness breath or trouble breathing.  No palpitations.  No fevers.  No dizziness or syncope.  No lower extremity swelling.  No altered mental status.  No trauma/injury.  States that she does have a PCP at [facility], but not rx coverage.    Past Medical History:  Diagnosis Date  . Allergy   . Hypertension   . Miscarriage 2005  . Sleep apnea    does not wear CPAP    Patient Active Problem List   Diagnosis Date Noted  . Psoriasis 10/06/2016  . Eczema 10/06/2016  . BMI 60.0-69.9, adult (HCC) 10/06/2016  . Hypertension 09/30/2016  . Sleep apnea 09/30/2016    Past Surgical History:  Procedure Laterality Date  . DILATION AND CURETTAGE OF UTERUS  2005  . endometrial surgery  1995  . left fallopian tube removal Left 2000    Prior to Admission medications   Medication Sig Start Date End Date Taking? Authorizing Provider  acyclovir (ZOVIRAX) 400 MG tablet Take 2 tablets (800 mg total) by mouth 5 (five) times daily for 10 days. 12/14/17 12/24/17  Governor Rooks, MD  amLODipine (NORVASC) 5 MG tablet Take 1 tablet (5 mg total) by mouth daily. Patient not taking: Reported on 10/19/2017 10/12/17   Galen Manila, NP  Biotin 16109 MCG TABS Take by mouth.    [provider]  ferrous sulfate 325 (65 FE) MG EC tablet Take 325 mg by mouth 3 (three) times daily with meals.    [provider]  hydrochlorothiazide (HYDRODIURIL) 25 MG tablet Take 1 tablet (25 mg total) by mouth daily. 10/12/17   Galen Manila, NP  losartan (COZAAR) 100 MG tablet Take 1 tablet (100 mg total) by mouth daily. 10/12/17   Galen Manila, NP  metFORMIN (GLUCOPHAGE) 500 MG tablet Take 1 tablet (500 mg total) by mouth 2 (two) times daily with a meal. 10/19/17   Galen Manila, NP  Multiple Vitamin (MULTIVITAMIN) tablet Take 1 tablet by mouth daily.    [provider]  Omega-3 Fatty Acids (FISH OIL) 1000 MG CAPS Take 1 capsule (1,000 mg total) by mouth daily. 10/28/16   Galen Manila, NP  simvastatin (ZOCOR) 20 MG tablet Take 1 tablet (20 mg total) by mouth at bedtime. 10/19/17   Galen Manila, NP  triamcinolone cream (KENALOG) 0.5 % Apply 1 application topically 3 (three) times daily. 05/05/17   Galen Manila, NP  vitamin E 1000 UNIT capsule Take 1,000 Units by mouth daily.    [provider]    Allergies  Allergen Reactions  . Lisinopril Cough  . Penicillins     Family History  Problem Relation Age of Onset  . Hypertension Mother   . Diabetes Father   . Prostate cancer Father   . Diabetes Sister   . Stroke Paternal Grandmother   . Osteoarthritis Sister   .  Breast cancer Maternal Aunt   . Colon cancer Neg Hx   . Ovarian cancer Neg Hx     Social History Social History   Tobacco Use  . Smoking status: Never Smoker  . Smokeless tobacco: Never Used  Substance Use Topics  . Alcohol use: Yes    Comment: 1-2 rarely  . Drug use: No    Review of Systems  Constitutional: Negative for fever. Eyes: Negative for visual changes. ENT: Negative for sore throat. Cardiovascular: Deferred left side chest and back pain as per HPI. Respiratory: Negative for shortness of breath. Gastrointestinal: Negative for abdominal pain, vomiting and diarrhea. Genitourinary: Negative for dysuria. Musculoskeletal: Negative for back pain. Skin:  Negative for rash. Neurological: Negative for headache.  ____________________________________________   PHYSICAL EXAM:  VITAL SIGNS: ED Triage Vitals [12/14/17 1050]  Enc Vitals Group     BP      Pulse      Resp      Temp      Temp src      SpO2      Weight (!) 356 lb (161.5 kg)     Height 5' 3.5" (1.613 m)     Head Circumference      Peak Flow      Pain Score 4     Pain Loc      Pain Edu?      Excl. in GC?      Constitutional: Alert and oriented.  HEENT      Head: Normocephalic and atraumatic.      Eyes: Conjunctivae are normal. Pupils equal and round.       Ears:         Nose: No congestion/rhinnorhea.      Mouth/Throat: Mucous membranes are moist.      Neck: No stridor. Cardiovascular/Chest: Normal rate, regular rhythm.  No murmurs, rubs, or gallops. Respiratory: Normal respiratory effort without tachypnea nor retractions. Breath sounds are clear and equal bilaterally. No wheezes/rales/rhonchi. Gastrointestinal: Soft. No distention, no guarding, no rebound. Nontender.  Obese. Genitourinary/rectal:Deferred Musculoskeletal: Nontender with normal range of motion in all extremities. No joint effusions.  No lower extremity tenderness.  No edema. Neurologic:  Normal speech and language. No gross or focal neurologic deficits are appreciated. Skin:  Skin is warm, dry.  Vesicular rash on her left back near the shoulder blade as well as across her left chest. Psychiatric: Mood and affect are normal. Speech and behavior are normal. Patient exhibits appropriate insight and judgment.   ____________________________________________  LABS (pertinent positives/negatives) I, Governor Rooks, MD the attending physician have reviewed the labs noted below.  Labs Reviewed  CBC  TROPONIN I  COMPREHENSIVE METABOLIC PANEL  POC URINE PREG, ED    ____________________________________________    EKG I, Governor Rooks, MD, the attending physician have personally viewed and interpreted  all ECGs.  89 beats per minute.  Normal sinus rhythm.  Narrow QS renal axis.  Normal ST and T wave. ____________________________________________  RADIOLOGY   Chest x-ray two-view: Reviewed by me, reviewed radiologist interpretation of no acute disease. __________________________________________  PROCEDURES  Procedure(s) performed: None  Procedures  Critical Care performed: None   ____________________________________________  ED COURSE / ASSESSMENT AND PLAN  Pertinent labs & imaging results that were available during my care of the patient were reviewed by me and considered in my medical decision making (see chart for details).    Patient has shingles clinically.  She does report some chest discomfort that was ongoing before the rash, although  I think unlikely to be ACS, patient is morbidly obese, and I think is [place] checking EKG.  EKG was normal in appearance.  Chest x-ray reassuring.  Labs reassuring  It sounds like the lesions have probably been there about 72 hours, however there are still new vesicular lesions and will give her antiviral.  She does not have insurance and so I am to prescribe acyclovir.     CONSULTATIONS:   None   Patient / Family / Caregiver informed of clinical course, medical decision-making process, and agree with plan.   I discussed return precautions, follow-up instructions, and discharge instructions with patient and/or family.  Discharge Instructions : You are being treated for shingles with antiviral called acyclovir.  Return to the emergency room immediately for any worsening symptoms including any new or worsening chest pain, any shortness of breath or trouble breathing, palpitations, dizziness or passing out, or any other symptoms concerning to you.    ___________________________________________   FINAL CLINICAL IMPRESSION(S) / ED DIAGNOSES   Final diagnoses:  Herpes zoster without complication       ___________________________________________         Note: This dictation was prepared with Dragon dictation. Any transcriptional errors that result from this process are unintentional    Governor Rooks, MD 12/19/17 [phone]

## 2017-12-14 NOTE — Discharge Instructions (Signed)
You are being treated for shingles with antiviral called acyclovir.  Return to the emergency room immediately for any worsening symptoms including any new or worsening chest pain, any shortness of breath or trouble breathing, palpitations, dizziness or passing out, or any other symptoms concerning to you.

## 2017-12-14 NOTE — ED Triage Notes (Signed)
FIRST NURSE NOTE-started with left shoulder/axilla type pain for 2 weeks. No injury. Intermittent. Present even when sitting.  Pulled for EKG.

## 2017-12-22 ENCOUNTER — Ambulatory Visit (INDEPENDENT_AMBULATORY_CARE_PROVIDER_SITE_OTHER): Payer: Medicaid Other | Admitting: Nurse Practitioner

## 2017-12-22 ENCOUNTER — Inpatient Hospital Stay: Payer: Medicaid Other | Admitting: Nurse Practitioner

## 2017-12-22 ENCOUNTER — Other Ambulatory Visit: Payer: Self-pay

## 2017-12-22 ENCOUNTER — Encounter: Payer: Self-pay | Admitting: Nurse Practitioner

## 2017-12-22 VITALS — BP 119/60 | HR 97 | Temp 98.3°F | Ht 63.5 in | Wt 354.0 lb

## 2017-12-22 DIAGNOSIS — M792 Neuralgia and neuritis, unspecified: Secondary | ICD-10-CM

## 2017-12-22 DIAGNOSIS — B029 Zoster without complications: Secondary | ICD-10-CM

## 2017-12-22 MED ORDER — GABAPENTIN 300 MG PO CAPS: 300.0000 mg | ORAL_CAPSULE | Freq: Three times a day (TID) | ORAL | 1 refills | Status: DC

## 2017-12-22 NOTE — Patient Instructions (Addendum)
Julia Snow,   Thank you for coming in to clinic today.  1. Finish out your Valtrex.    2. START gabapentin 300 mg tablets up to three times daily for nerve pain from shingles.  3. Get your Shingrix vaccines at a local pharmacy in about 2 months.  Please schedule a follow-up appointment with Wilhelmina Mcardle, AGNP. Return in 1-2 weeks if symptoms worsen or fail to improve.  If you have any other questions or concerns, please feel free to call the clinic or send a message through MyChart. You may also schedule an earlier appointment if necessary.  You will receive a survey after today's visit either digitally by e-mail or paper by [employer]. Your experiences and feedback matter to [place].  Please respond so we know how we are doing as we provide care for you.   Wilhelmina Mcardle, DNP, AGNP-BC Adult Gerontology Nurse Practitioner [facility]

## 2017-12-22 NOTE — Progress Notes (Signed)
Subjective:    Patient ID: Julia Snow, female    DOB: Mar 12, 1966, 52 y.o.   MRN: [id number]  Julia Snow is a 52 y.o. female presenting on 12/22/2017 for Hospitalization Follow-up (recent shingle outbreak located on the Left side of the shoulder blade.   x 8 days Pt currently taking ibuprofen every 3 hours )   HPI Shingles Patient was seen on 12/14/2017 for Shingles rash.  She was treated with acyclovir and continues to take acyclovir as prescribed.  Patient has had no new eruptions of rash, but continues to have problems with pain from her shingles rash.  She is currently taking Ibuprofen every 3 hours - takes 2 tabs 5 x per day.  Associated symptoms include numbness and itching under arm and has burning pain at site of itching and rash over breast, on back.  Social History   Tobacco Use  . Smoking status: Never Smoker  . Smokeless tobacco: Never Used  Substance Use Topics  . Alcohol use: Yes    Comment: 1-2 rarely  . Drug use: No    Review of Systems Per HPI unless specifically indicated above     Objective:    BP 119/60 (BP Location: Right Arm, Patient Position: Sitting, Cuff Size: Large)   Pulse 97   Temp 98.3 F (36.8 C) (Oral)   Ht 5' 3.5" (1.613 m)   Wt (!) 354 lb (160.6 kg)   LMP 08/19/2017   BMI 61.72 kg/m   Wt Readings from Last 3 Encounters:  12/22/17 (!) 354 lb (160.6 kg)  12/14/17 (!) 356 lb (161.5 kg)  10/19/17 (!) 364 lb (165.1 kg)    Physical Exam  Constitutional: She is oriented to person, place, and time. She appears well-developed and well-nourished. No distress.  HENT:  Head: Normocephalic and atraumatic.  Neurological: She is alert and oriented to person, place, and time.  Skin: Skin is warm and dry.     Psychiatric: She has a normal mood and affect. Her behavior is normal.  Vitals reviewed.  Results for orders placed or performed during the hospital encounter of 12/14/17  CBC  Result Value Ref Range   WBC 6.2 3.6 - 11.0 K/uL   RBC  4.22 3.80 - 5.20 MIL/uL   Hemoglobin 12.7 12.0 - 16.0 g/dL   HCT 40.9 81.1 - 91.4 %   MCV 88.4 80.0 - 100.0 fL   MCH 30.0 26.0 - 34.0 pg   MCHC 34.0 32.0 - 36.0 g/dL   RDW 78.2 (H) 95.6 - 21.3 %   Platelets 229 150 - 440 K/uL  Troponin I  Result Value Ref Range   Troponin I <0.03 <0.03 ng/mL  Comprehensive metabolic panel  Result Value Ref Range   Sodium 140 135 - 145 mmol/L   Potassium 4.2 3.5 - 5.1 mmol/L   Chloride 105 98 - 111 mmol/L   CO2 27 22 - 32 mmol/L   Glucose, Bld 136 (H) 70 - 99 mg/dL   BUN 13 6 - 20 mg/dL   Creatinine, Ser 0.86 0.44 - 1.00 mg/dL   Calcium 8.7 (L) 8.9 - 10.3 mg/dL   Total Protein 6.1 (L) 6.5 - 8.1 g/dL   Albumin 3.4 (L) 3.5 - 5.0 g/dL   AST 24 15 - 41 U/L   ALT 22 0 - 44 U/L   Alkaline Phosphatase 39 38 - 126 U/L   Total Bilirubin 0.9 0.3 - 1.2 mg/dL   GFR calc non Af Amer >60 >60 mL/min  GFR calc Af Amer >60 >60 mL/min   Anion gap 8 5 - 15      Assessment & Plan:   Problem List Items Addressed This Visit    None    Visit Diagnoses    Herpes zoster without complication    -  Primary   Neuropathic pain       Relevant Medications   gabapentin (NEURONTIN) 300 MG capsule    Clinically consistent with healing acute shingles herpes zoster outbreak onset 8 days ago, with moderate neuropathic pain. - Never received zoster vaccine - No evidence of complications, such as ocular involvement  Plan: 1. Continue acyclovir as prescribed, counseled on treatment course and side effects. 2. START gabapentin 300 mg tid prn for neuropathic pain. Reduce ibuprofen if able. 3. Counseling on infectious nature of problem with avoiding close contact with high risk populations. 4. Follow-up as needed within 4-6 weeks if not resolving or if residual pain, strict return criteria if ocular involvement or other acute concerns.    Meds ordered this encounter  Medications  . gabapentin (NEURONTIN) 300 MG capsule    Sig: Take 1 capsule (300 mg total) by mouth 3  (three) times daily.    Dispense:  90 capsule    Refill:  1    Order Specific Question:   Supervising Provider    Answer:   Smitty Cords [[id number]]    Follow up plan: Return in 1-2 weeks if symptoms worsen or fail to improve.  Wilhelmina Mcardle, DNP, AGPCNP-BC Adult Gerontology Primary Care Nurse Practitioner [facility], [facility] 12/22/2017, 11:16 AM

## 2018-01-12 ENCOUNTER — Ambulatory Visit: Admit: 2018-01-12 | Discharge: 2018-01-13

## 2018-01-12 DIAGNOSIS — R21 Rash and other nonspecific skin eruption: Secondary | ICD-10-CM

## 2018-01-12 DIAGNOSIS — L409 Psoriasis, unspecified: Secondary | ICD-10-CM

## 2018-01-12 DIAGNOSIS — D485 Neoplasm of uncertain behavior of skin: Principal | ICD-10-CM

## 2018-01-12 MED ORDER — CLOBETASOL 0.05 % TOPICAL OINTMENT
6 refills | 0 days | Status: CP
Start: 2018-01-12 — End: ?

## 2018-01-12 MED ORDER — CLOBETASOL 0.05 % SCALP SOLUTION
6 refills | 0 days | Status: CP
Start: 2018-01-12 — End: ?

## 2018-01-19 ENCOUNTER — Ambulatory Visit: Payer: Medicaid Other | Admitting: Nurse Practitioner

## 2018-01-25 ENCOUNTER — Ambulatory Visit: Payer: Medicaid Other | Admitting: Nurse Practitioner

## 2018-01-26 ENCOUNTER — Ambulatory Visit: Payer: Medicaid Other | Admitting: Nurse Practitioner

## 2018-02-01 MED ORDER — SECUKINUMAB 150 MG/ML SUBCUTANEOUS PEN INJECTOR
PRN refills | 0 days | Status: CP
Start: 2018-02-01 — End: ?

## 2018-02-04 ENCOUNTER — Encounter: Payer: Self-pay | Admitting: Nurse Practitioner

## 2018-02-04 ENCOUNTER — Ambulatory Visit (INDEPENDENT_AMBULATORY_CARE_PROVIDER_SITE_OTHER): Payer: Self-pay | Admitting: Nurse Practitioner

## 2018-02-04 ENCOUNTER — Other Ambulatory Visit: Payer: Self-pay

## 2018-02-04 VITALS — BP 121/61 | HR 61 | Temp 98.2°F | Ht 63.5 in | Wt 355.0 lb

## 2018-02-04 DIAGNOSIS — M792 Neuralgia and neuritis, unspecified: Secondary | ICD-10-CM

## 2018-02-04 DIAGNOSIS — E1165 Type 2 diabetes mellitus with hyperglycemia: Secondary | ICD-10-CM

## 2018-02-04 DIAGNOSIS — I10 Essential (primary) hypertension: Secondary | ICD-10-CM

## 2018-02-04 LAB — POCT GLYCOSYLATED HEMOGLOBIN (HGB A1C): Hemoglobin A1C: 6.3 % — AB (ref 4.0–5.6)

## 2018-02-04 MED ORDER — SIMVASTATIN 20 MG PO TABS: 20.0000 mg | ORAL_TABLET | Freq: Every day | ORAL | 1 refills | Status: DC

## 2018-02-04 MED ORDER — AMLODIPINE BESYLATE 5 MG PO TABS: 5.0000 mg | ORAL_TABLET | Freq: Every day | ORAL | 1 refills | Status: DC

## 2018-02-04 MED ORDER — GABAPENTIN 300 MG PO CAPS: 600.0000 mg | ORAL_CAPSULE | Freq: Every day | ORAL | 1 refills | Status: DC

## 2018-02-04 MED ORDER — METFORMIN HCL 500 MG PO TABS: 500.0000 mg | ORAL_TABLET | Freq: Two times a day (BID) | ORAL | 1 refills | Status: DC

## 2018-02-04 MED ORDER — LOSARTAN POTASSIUM 100 MG PO TABS: 100.0000 mg | ORAL_TABLET | Freq: Every day | ORAL | 1 refills | Status: DC

## 2018-02-04 MED ORDER — HYDROCHLOROTHIAZIDE 25 MG PO TABS: 25.0000 mg | ORAL_TABLET | Freq: Every day | ORAL | 1 refills | Status: DC

## 2018-02-04 NOTE — Patient Instructions (Addendum)
Margretta Ditty,   Thank you for coming in to clinic today.  1. Continue gabapentin.  Take 600 mg at bedtime.  2. Continue metformin 500 mg twice daily.  A1c control is still very good!  3. No other med changes today.  Please schedule a follow-up appointment with Wilhelmina Mcardle, AGNP. Return in about 6 months (around 08/06/2018) for diabetes.   If you have any other questions or concerns, please feel free to call the clinic or send a message through MyChart. You may also schedule an earlier appointment if necessary.  You will receive a survey after today's visit either digitally by e-mail or paper by [employer]. Your experiences and feedback matter to [place].  Please respond so we know how we are doing as we provide care for you.   Wilhelmina Mcardle, DNP, AGNP-BC Adult Gerontology Nurse Practitioner [facility], [facility]

## 2018-02-04 NOTE — Progress Notes (Signed)
Subjective:    Patient ID: Julia Snow, female    DOB: 1966-03-29, 52 y.o.   MRN: [id number]  Julia Snow is a 52 y.o. female presenting on 02/04/2018 for Hypertension and Diabetes   HPI Hypertension - She is checking BP at home or outside of clinic.    - Current medications: amlodipine 5 mg once daily, losartan 100 mg once daily, hydrochlorothiazide 25 mg once daily, tolerating well without side effects - She is not currently symptomatic. - Pt denies headache, lightheadedness, dizziness, changes in vision, chest tightness/pressure, palpitations, leg swelling, sudden loss of speech or loss of consciousness. - She  reports no regular exercise routine. - Her diet is moderate in salt, moderate in fat, and high in carbohydrates.   Diabetes Pt presents today for follow up of Type 2 diabetes mellitus. She is not checking CBG at home.   - Current diabetic medications include: metformin 500 mg twice daily. - She is not currently symptomatic.  - She denies polydipsia, polyphagia, polyuria, headaches, diaphoresis, shakiness, chills, pain, numbness or tingling in extremities and changes in vision.   - Clinical course has been unchanged. - Weight trend: stable  PREVENTION: Eye exam current (within one year): yes - Nov 15 at [facility] Foot exam current (within one year): no Lipid/ASCVD risk reduction - on statin: simvastatin 20 mg once daily at bedtime. Kidney protection - on ace or arb: losartan Recent Labs    10/13/17 0000 02/04/18 1413  HGBA1C 6.9* 6.3*   Social History   Tobacco Use  . Smoking status: Never Smoker  . Smokeless tobacco: Never Used  Substance Use Topics  . Alcohol use: Yes    Comment: 1-2 rarely  . Drug use: No    Review of Systems Per HPI unless specifically indicated above     Objective:    BP 121/61   Pulse 61   Temp 98.2 F (36.8 C) (Oral)   Ht 5' 3.5" (1.613 m)   Wt (!) 355 lb (161 kg)   LMP 08/19/2017   BMI 61.90 kg/m   Wt Readings  from Last 3 Encounters:  02/04/18 (!) 355 lb (161 kg)  12/22/17 (!) 354 lb (160.6 kg)  12/14/17 (!) 356 lb (161.5 kg)    Physical Exam  Constitutional: She is oriented to person, place, and time. She appears well-developed and well-nourished. No distress.  HENT:  Head: Normocephalic and atraumatic.  Cardiovascular: Normal rate, regular rhythm, S1 normal, S2 normal, normal heart sounds and intact distal pulses.  Pulmonary/Chest: Effort normal and breath sounds normal. No respiratory distress.  Neurological: She is alert and oriented to person, place, and time.  Skin: Skin is warm and dry.  Psychiatric: She has a normal mood and affect. Her behavior is normal.  Vitals reviewed.   Diabetic Foot Exam - Simple   Simple Foot Form Diabetic Foot exam was performed with the following findings:  Yes 02/04/2018  2:35 PM  Visual Inspection See comments:  Yes Sensation Testing Intact to touch and monofilament testing bilaterally:  Yes Pulse Check Posterior Tibialis and Dorsalis pulse intact bilaterally:  Yes Comments Fallen foot arches.  Rounding of lateral edges of foot bilaterally.  Wide foot base. Hyperkeratosis on lateral aspects of plantar surface of foot.     Results for orders placed or performed during the hospital encounter of 12/14/17  CBC  Result Value Ref Range   WBC 6.2 3.6 - 11.0 K/uL   RBC 4.22 3.80 - 5.20 MIL/uL   Hemoglobin 12.7  12.0 - 16.0 g/dL   HCT 86.5 78.4 - 69.6 %   MCV 88.4 80.0 - 100.0 fL   MCH 30.0 26.0 - 34.0 pg   MCHC 34.0 32.0 - 36.0 g/dL   RDW 29.5 (H) 28.4 - 13.2 %   Platelets 229 150 - 440 K/uL  Troponin I  Result Value Ref Range   Troponin I <0.03 <0.03 ng/mL  Comprehensive metabolic panel  Result Value Ref Range   Sodium 140 135 - 145 mmol/L   Potassium 4.2 3.5 - 5.1 mmol/L   Chloride 105 98 - 111 mmol/L   CO2 27 22 - 32 mmol/L   Glucose, Bld 136 (H) 70 - 99 mg/dL   BUN 13 6 - 20 mg/dL   Creatinine, Ser 4.40 0.44 - 1.00 mg/dL   Calcium 8.7 (L)  8.9 - 10.3 mg/dL   Total Protein 6.1 (L) 6.5 - 8.1 g/dL   Albumin 3.4 (L) 3.5 - 5.0 g/dL   AST 24 15 - 41 U/L   ALT 22 0 - 44 U/L   Alkaline Phosphatase 39 38 - 126 U/L   Total Bilirubin 0.9 0.3 - 1.2 mg/dL   GFR calc non Af Amer >60 >60 mL/min   GFR calc Af Amer >60 >60 mL/min   Anion gap 8 5 - 15      Assessment & Plan:   Problem List Items Addressed This Visit      Cardiovascular and Mediastinum   Hypertension Controlled hypertension.  BP goal < 130/80.  Pt is working on lifestyle modifications.  Taking medications tolerating well without side effects. No currently identified complications except morbid obesity.  Plan: 1. Continue taking medications without changes 2. Obtain labs next visit.   Last labs in 12/2017 with normal kidney function. 3. Encouraged heart healthy diet and increasing exercise to 30 minutes most days of the week. 4. Check BP 1-2 x per week at home, keep log, and bring to clinic at next appointment. 5. Follow up 6 months.       Endocrine   Type 2 diabetes mellitus with hyperglycemia, without long-term current use of insulin (HCC) - Primary Well-controlledDM with A1c 6.3 % improved from 6.9% and goal A1c < 7.0%. - Complications - foot deformity and hyperlipidemia.  Plan:  1. Continue current therapy: metformin 500 mg bid 2. Encourage improved lifestyle: - low carb/low glycemic diet reinforced prior education - Increase physical activity to 30 minutes most days of the week.  Explained that increased physical activity increases body's use of sugar for energy. 3. Check fasting am CBG and bring log to next visit for review 4. Continue ARB and Statin 5. DM Foot exam done today with abnormal findings.   and Advised to schedule DM ophtho exam, send record. 6. 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  Continue gabapentin 600 mg at bedtime.  Refill provided.  Encouraged patient to get shingrix vaccine, but is currently prohibited by cost.  Follow-up prn.   Relevant Medications   gabapentin (NEURONTIN) 300 MG capsule      Meds ordered this encounter  Medications  . gabapentin (NEURONTIN) 300 MG capsule    Sig: Take 2 capsules (600 mg total) by mouth at bedtime.    Dispense:  90 capsule    Refill:  1  Order Specific Question:   Supervising Provider    Answer:   Smitty Cords [[id number]]    Follow up plan: Return in about 6 months (around 08/06/2018) for diabetes.  Wilhelmina Mcardle, DNP, AGPCNP-BC Adult Gerontology Primary Care Nurse Practitioner [facility] 02/04/2018, 2:03 PM

## 2018-03-29 ENCOUNTER — Telehealth: Payer: Self-pay | Admitting: Nurse Practitioner

## 2018-03-29 NOTE — Telephone Encounter (Signed)
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  She needs a letter about her BP and diabetes.  Her call back number is [phone]

## 2018-03-29 NOTE — Telephone Encounter (Signed)
Attempted to contact the pt to find out more information about the letter she's requesting.

## 2018-03-31 ENCOUNTER — Telehealth: Payer: Self-pay

## 2018-03-31 ENCOUNTER — Telehealth: Payer: Self-pay | Admitting: Nurse Practitioner

## 2018-03-31 NOTE — Telephone Encounter (Signed)
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 She states she passed the Physical portions, but they are still requiring her to get a letter stating that her diabetes and hypertension is been monitored and treated by Leotis ShamesLauren. She also mention that they recommended her to get a sleep study, but currently the patient doesn't have insurance coverage to get a sleep study. [facility]

## 2018-03-31 NOTE — Telephone Encounter (Signed)
The pt was notified. No questions or concerns. 

## 2018-03-31 NOTE — Telephone Encounter (Signed)
error 

## 2018-03-31 NOTE — Telephone Encounter (Signed)
-   Letter is created and signed.  - If patient can get a copy of her last sleep study, that may be sufficient.  If she has her CPAP from initial diagnosis, she should start wearing it nightly.

## 2018-04-16 ENCOUNTER — Ambulatory Visit (INDEPENDENT_AMBULATORY_CARE_PROVIDER_SITE_OTHER): Payer: Medicaid Other | Admitting: Nurse Practitioner

## 2018-04-16 ENCOUNTER — Other Ambulatory Visit: Payer: Self-pay

## 2018-04-16 ENCOUNTER — Encounter: Payer: Self-pay | Admitting: Nurse Practitioner

## 2018-04-16 VITALS — BP 117/53 | HR 64 | Temp 98.4°F | Ht 63.5 in | Wt 348.5 lb

## 2018-04-16 DIAGNOSIS — Z9189 Other specified personal risk factors, not elsewhere classified: Secondary | ICD-10-CM

## 2018-04-16 NOTE — Patient Instructions (Addendum)
Margretta Ditty,   Thank you for coming in to clinic today.  1. [facility] will contact you about scheduling your home sleep study.  Please schedule a follow-up appointment with Wilhelmina Mcardle, AGNP. Return if symptoms worsen or fail to improve.  If you have any other questions or concerns, please feel free to call the clinic or send a message through MyChart. You may also schedule an earlier appointment if necessary.  You will receive a survey after today's visit either digitally by e-mail or paper by [employer]. Your experiences and feedback matter to [place].  Please respond so we know how we are doing as we provide care for you.   Wilhelmina Mcardle, DNP, AGNP-BC Adult Gerontology Nurse Practitioner [facility], [facility]

## 2018-04-16 NOTE — Progress Notes (Signed)
Subjective:    Patient ID: Margretta DittyJanice G Cothron, female    DOB: 02/22/1966, 53 y.o.   MRN: [id number]  Margretta DittyJanice G Giannotti is a 53 y.o. female presenting on 04/16/2018 for Sleep Apnea ([facility])   HPI Sleep Study Patient desires to have a sleep study for sleep apnea.  Patient reports signs and symptoms of sleep apnea that include: none. - Patient has had prior study in early 2000s that documented sleep apnea and started using a CPAP. She has not used CPAP since 3-4 months after initial study.  - Patient has already discussed financial responsibilities with [facility]. - Patient needs study for work and needs new study within last 5 years.  DOT physical renewal prompted.   Epworth Sleepiness Scale Patient's Answer Chance of dozing off under normal circumstances  Sitting and reading  1   Watching TV 1 0 = Never  Sitting inactive in a public place 0 1 = Slight chance  As a passenger in a car for an hour without a break 0 2 = Moderate chance  Lying down to rest in the afternoon when circumstances permit 2 3 = High chance  Sitting and talking to someone 0   Sitting quietly after a lunch without alcohol 0   In a car, while stopped for a few minutes in traffic 0                                                              Total Score: 4    0-7: It is unlikely that you are abnormally sleepy. 8-9: You have an average amount of daytime sleepiness. >9: POSITIVE - Recommend further evaluation, sleep specialist or sleep study (>16-24 = severe)   STOP-Bang OSA (scoring y/n) Snoring y   Tiredness n   Observed apneas n   Pressure HTN y   BMI > 35 kg/m2 y   Age > 5450  y   Neck (female >17 in; Female >16 in)  y 7418 in  Gender female n   OSA risk low (0-2)  OSA risk intermediate (3-4)  OSA risk high (5+)  Total: 5     Health Maintenance: [facility]  Patient states she can request it to be sent or bring to clinic.  Social History   Tobacco Use  . Smoking  status: Never Smoker  . Smokeless tobacco: Never Used  Substance Use Topics  . Alcohol use: Yes    Comment: 1-2 rarely  . Drug use: No    Review of Systems Per HPI unless specifically indicated above     Objective:    BP (!) 117/53 (BP Location: Right Arm, Patient Position: Sitting, Cuff Size: Large)   Pulse 64   Temp 98.4 F (36.9 C) (Oral)   Ht 5' 3.5" (1.613 m)   Wt (!) 348 lb 8 oz (158.1 kg)   LMP 08/19/2017   BMI 60.77 kg/m   Wt Readings from Last 3 Encounters:  04/16/18 (!) 348 lb 8 oz (158.1 kg)  02/04/18 (!) 355 lb (161 kg)  12/22/17 (!) 354 lb (160.6 kg)    Physical Exam Vitals signs reviewed.  Constitutional:      General: She is not in acute distress.    Appearance: She is well-developed.  HENT:     Head: Normocephalic and atraumatic.  Cardiovascular:     Rate and Rhythm: Normal rate and regular rhythm.     Pulses:          Radial pulses are 2+ on the right side and 2+ on the left side.       Posterior tibial pulses are 1+ on the right side and 1+ on the left side.     Heart sounds: Normal heart sounds, S1 normal and S2 normal.  Pulmonary:     Effort: Pulmonary effort is normal. No respiratory distress.     Breath sounds: Normal breath sounds and air entry.  Musculoskeletal:     Right lower leg: No edema.     Left lower leg: No edema.  Skin:    General: Skin is warm and dry.     Capillary Refill: Capillary refill takes less than 2 seconds.  Neurological:     Mental Status: She is alert and oriented to person, place, and time.  Psychiatric:        Attention and Perception: Attention normal.        Mood and Affect: Mood and affect normal.        Behavior: Behavior normal. Behavior is cooperative.    Results for orders placed or performed in visit on 02/04/18  POCT glycosylated hemoglobin (Hb A1C)  Result Value Ref Range   Hemoglobin A1C 6.3 (A) 4.0 - 5.6 %   HbA1c POC (<> result, manual entry)     HbA1c, POC (prediabetic range)     HbA1c, POC  (controlled diabetic range)        Assessment & Plan:   Problem List Items Addressed This Visit    None    Visit Diagnoses    At risk for obstructive sleep apnea    -  Primary   Relevant Orders   Ambulatory referral to Sleep Studies    Test is needed for previously identified sleep apnea on sleep study, needs verification and treatment for DOT physical. - Epworth - normal daytime sleepiness - Stop-Bang - Increased risk of OSA with risk factors of snoring, HTN, large neck circumference, morbid obesity, Age > 50  - Hypertension well controlled today on exam.  Plan: 1. Referral to sleep study. 2. Encouraged patient to consider weight loss. 3. Follow-up prn.  Follow up plan: Return if symptoms worsen or fail to improve. Also follow-up as scheduled for regular follow-up.  Wilhelmina Mcardle, DNP, AGPCNP-BC Adult Gerontology Primary Care Nurse Practitioner [facility] 04/16/2018, 8:11 AM

## 2018-06-29 ENCOUNTER — Encounter: Payer: Self-pay | Admitting: Nurse Practitioner

## 2018-06-29 ENCOUNTER — Ambulatory Visit (INDEPENDENT_AMBULATORY_CARE_PROVIDER_SITE_OTHER): Payer: Medicaid Other | Admitting: Nurse Practitioner

## 2018-06-29 ENCOUNTER — Other Ambulatory Visit: Payer: Self-pay

## 2018-06-29 VITALS — BP 141/78 | HR 72 | Temp 98.2°F | Ht 63.5 in | Wt 350.5 lb

## 2018-06-29 DIAGNOSIS — E1165 Type 2 diabetes mellitus with hyperglycemia: Secondary | ICD-10-CM

## 2018-06-29 DIAGNOSIS — E785 Hyperlipidemia, unspecified: Secondary | ICD-10-CM

## 2018-06-29 DIAGNOSIS — M792 Neuralgia and neuritis, unspecified: Secondary | ICD-10-CM

## 2018-06-29 DIAGNOSIS — I10 Essential (primary) hypertension: Secondary | ICD-10-CM

## 2018-06-29 LAB — COMPLETE METABOLIC PANEL WITH GFR
AG Ratio: 1.7 (calc) (ref 1.0–2.5)
ALT: 15 U/L (ref 6–29)
AST: 16 U/L (ref 10–35)
Albumin: 3.8 g/dL (ref 3.6–5.1)
Alkaline phosphatase (APISO): 46 U/L (ref 37–153)
BUN: 17 mg/dL (ref 7–25)
CO2: 28 mmol/L (ref 20–32)
Calcium: 9.1 mg/dL (ref 8.6–10.4)
Chloride: 104 mmol/L (ref 98–110)
Creat: 0.99 mg/dL (ref 0.50–1.05)
GFR, Est African American: 76 mL/min/{1.73_m2} (ref >=60)
GFR, Est Non African American: 66 mL/min/{1.73_m2} (ref >=60)
Globulin: 2.3 g/dL (calc) (ref 1.9–3.7)
Glucose, Bld: 135 mg/dL — ABNORMAL HIGH (ref 65–99)
Potassium: 4.5 mmol/L (ref 3.5–5.3)
Sodium: 140 mmol/L (ref 135–146)
Total Bilirubin: 0.7 mg/dL (ref 0.2–1.2)
Total Protein: 6.1 g/dL (ref 6.1–8.1)

## 2018-06-29 LAB — POCT GLYCOSYLATED HEMOGLOBIN (HGB A1C): Hemoglobin A1C: 6.5 % — AB (ref 4.0–5.6)

## 2018-06-29 LAB — LIPID PANEL
Cholesterol: 134 mg/dL (ref <200)
HDL: 47 mg/dL — ABNORMAL LOW (ref >=50)
LDL Cholesterol (Calc): 72 mg/dL (calc)
Non-HDL Cholesterol (Calc): 87 mg/dL (calc) (ref <130)
Total CHOL/HDL Ratio: 2.9 (calc) (ref <5.0)
Triglycerides: 71 mg/dL (ref <150)

## 2018-06-29 LAB — TSH: TSH: 2.29 mIU/L

## 2018-06-29 MED ORDER — SIMVASTATIN 20 MG PO TABS: 20.0000 mg | ORAL_TABLET | Freq: Every day | ORAL | 1 refills | Status: DC

## 2018-06-29 MED ORDER — LOSARTAN POTASSIUM 100 MG PO TABS: 100.0000 mg | ORAL_TABLET | Freq: Every day | ORAL | 1 refills | Status: DC

## 2018-06-29 MED ORDER — SIMVASTATIN 20 MG PO TABS: 20.0000 mg | ORAL_TABLET | Freq: Every day | ORAL | 1 refills | Status: AC

## 2018-06-29 MED ORDER — METFORMIN HCL 500 MG PO TABS: 500.0000 mg | ORAL_TABLET | Freq: Two times a day (BID) | ORAL | 1 refills | Status: DC

## 2018-06-29 MED ORDER — HYDROCHLOROTHIAZIDE 25 MG PO TABS: 25.0000 mg | ORAL_TABLET | Freq: Every day | ORAL | 1 refills | Status: AC

## 2018-06-29 MED ORDER — HYDROCHLOROTHIAZIDE 25 MG PO TABS: 25.0000 mg | ORAL_TABLET | Freq: Every day | ORAL | 1 refills | Status: DC

## 2018-06-29 MED ORDER — GABAPENTIN 300 MG PO CAPS: 600.0000 mg | ORAL_CAPSULE | Freq: Every day | ORAL | 1 refills | Status: AC | PRN

## 2018-06-29 MED ORDER — AMLODIPINE BESYLATE 10 MG PO TABS: 10.0000 mg | ORAL_TABLET | Freq: Every day | ORAL | 1 refills | Status: AC

## 2018-06-29 MED ORDER — LOSARTAN POTASSIUM 100 MG PO TABS: 100.0000 mg | ORAL_TABLET | Freq: Every day | ORAL | 1 refills | Status: AC

## 2018-06-29 NOTE — Patient Instructions (Addendum)
Julia Snow,   Thank you for coming in to clinic today.  1. Change to higher dose of amlodipine.  Take   2. BP goal is less than 130/80  3. [facility] at [facility] [address] [place], [place] [address]  [phone]   Please schedule a follow-up appointment with Wilhelmina Mcardle, AGNP. Return in about 6 months (around 12/30/2018) for hypertension, diabetes.  If you have any other questions or concerns, please feel free to call the clinic or send a message through MyChart. You may also schedule an earlier appointment if necessary.  You will receive a survey after today's visit either digitally by e-mail or paper by [employer]. Your experiences and feedback matter to [place].  Please respond so we know how we are doing as we provide care for you.   Wilhelmina Mcardle, DNP, AGNP-BC Adult Gerontology Nurse Practitioner [facility], [facility]

## 2018-06-29 NOTE — Progress Notes (Signed)
Subjective:    Patient ID: Julia Snow, female    DOB: [birth year], 53 y.o.   MRN: [id number]  Julia Snow is a 53 y.o. female presenting on 06/29/2018 for Diabetes   HPI Diabetes Pt presents today for follow up of Type 2 diabetes mellitus. She is not checking CBG at home regularly.  2 days ago, 93 fasting am. - Current diabetic medications include: metformin 500 mg one tab bid - She is not currently symptomatic.  - She denies polydipsia, polyphagia, polyuria, headaches, diaphoresis, shakiness, chills, pain, numbness or tingling in extremities and changes in vision.   - Clinical course has been stable. - She  reports no regular exercise routine. - Her diet is moderate in salt, moderate in fat, and moderate in carbohydrates. - Weight trend: stable  PREVENTION: Eye exam current (within one year): no Foot exam current (within one year): yes Lipid/ASCVD risk reduction - on statin: yes Kidney protection - on ace or arb: yes Recent Labs    10/13/17 0000 02/04/18 1413  HGBA1C 6.9* 6.3*    Hypertension - She is not checking BP at home or outside of clinic.    - Current medications are tolerated well without side effects. - She is not currently symptomatic. - Pt denies headache, lightheadedness, dizziness, changes in vision, chest tightness/pressure, palpitations, leg swelling, sudden loss of speech or loss of consciousness. - She  reports no regular exercise routine. - Her diet is high in salt, high in fat, and high in carbohydrates.   Social History   Tobacco Use  . Smoking status: Never Smoker  . Smokeless tobacco: Never Used  Substance Use Topics  . Alcohol use: Yes    Comment: 1-2 rarely  . Drug use: No    Review of Systems Per HPI unless specifically indicated above     Objective:    BP (!) 141/78 (BP Location: Right Arm, Patient Position: Sitting, Cuff Size: Large)   Pulse 72   Temp 98.2 F (36.8 C) (Oral)   Ht 5' 3.5" (1.613 m)   Wt (!) 350 lb 8 oz (159  kg)   LMP 08/19/2017   BMI 61.11 kg/m   Wt Readings from Last 3 Encounters:  06/29/18 (!) 350 lb 8 oz (159 kg)  04/16/18 (!) 348 lb 8 oz (158.1 kg)  02/04/18 (!) 355 lb (161 kg)    Physical Exam Vitals signs reviewed.  Constitutional:      General: She is not in acute distress.    Appearance: She is well-developed.  HENT:     Head: Normocephalic and atraumatic.     Right Ear: Tympanic membrane, ear canal and external ear normal.     Left Ear: Tympanic membrane, ear canal and external ear normal.     Nose: Nose normal.     Mouth/Throat:     Mouth: Mucous membranes are moist.     Pharynx: Oropharynx is clear.  Eyes:     Extraocular Movements: Extraocular movements intact.     Conjunctiva/sclera: Conjunctivae normal.     Pupils: Pupils are equal, round, and reactive to light.  Cardiovascular:     Rate and Rhythm: Normal rate and regular rhythm.     Pulses:          Radial pulses are 2+ on the right side and 2+ on the left side.       Posterior tibial pulses are 1+ on the right side and 1+ on the left side.     Heart  sounds: Normal heart sounds, S1 normal and S2 normal.  Pulmonary:     Effort: Pulmonary effort is normal. No respiratory distress.     Breath sounds: Normal breath sounds and air entry.  Musculoskeletal:     Right lower leg: No edema.     Left lower leg: No edema.  Skin:    General: Skin is warm and dry.     Capillary Refill: Capillary refill takes less than 2 seconds.  Neurological:     Mental Status: She is alert and oriented to person, place, and time.  Psychiatric:        Attention and Perception: Attention normal.        Mood and Affect: Mood and affect normal.        Behavior: Behavior normal. Behavior is cooperative.        Thought Content: Thought content normal.        Judgment: Judgment normal.    Results for orders placed or performed in visit on 06/29/18  Lipid panel  Result Value Ref Range   Cholesterol 134 <200 mg/dL   HDL 47 (L) > OR = 50  mg/dL   Triglycerides 71 <960 mg/dL   LDL Cholesterol (Calc) 72 mg/dL (calc)   Total CHOL/HDL Ratio 2.9 <5.0 (calc)   Non-HDL Cholesterol (Calc) 87 <454 mg/dL (calc)  COMPLETE METABOLIC PANEL WITH GFR  Result Value Ref Range   Glucose, Bld 135 (H) 65 - 99 mg/dL   BUN 17 7 - 25 mg/dL   Creat 0.98 1.19 - 1.47 mg/dL   GFR, Est Non African American 66 > OR = 60 mL/min/1.31m2   GFR, Est African American 76 > OR = 60 mL/min/1.76m2   BUN/Creatinine Ratio NOT APPLICABLE 6 - 22 (calc)   Sodium 140 135 - 146 mmol/L   Potassium 4.5 3.5 - 5.3 mmol/L   Chloride 104 98 - 110 mmol/L   CO2 28 20 - 32 mmol/L   Calcium 9.1 8.6 - 10.4 mg/dL   Total Protein 6.1 6.1 - 8.1 g/dL   Albumin 3.8 3.6 - 5.1 g/dL   Globulin 2.3 1.9 - 3.7 g/dL (calc)   AG Ratio 1.7 1.0 - 2.5 (calc)   Total Bilirubin 0.7 0.2 - 1.2 mg/dL   Alkaline phosphatase (APISO) 46 37 - 153 U/L   AST 16 10 - 35 U/L   ALT 15 6 - 29 U/L  TSH  Result Value Ref Range   TSH 2.29 mIU/L  POCT glycosylated hemoglobin (Hb A1C)  Result Value Ref Range   Hemoglobin A1C 6.5 (A) 4.0 - 5.6 %   HbA1c POC (<> result, manual entry)     HbA1c, POC (prediabetic range)     HbA1c, POC (controlled diabetic range)        Assessment & Plan:   Problem List Items Addressed This Visit      Cardiovascular and Mediastinum   Hypertension Uncontrolled hypertension.  BP goal < 130/80.  Pt is not currently working on lifestyle modifications.  Taking medications tolerating well without side effects. Complicated by morbid obesity  Plan: 1. Increase to amlodipine 10 mg once daily - Continue losartan, HCTZ 2. Obtain labs today  3. Encouraged heart healthy diet and increasing exercise to 30 minutes most days of the week. 4. Check BP 1-2 x per week at home, keep log, and bring to clinic at next appointment. 5. Follow up 6 months.     Relevant Medications   amLODipine (NORVASC) 10 MG tablet   losartan (  COZAAR) 100 MG tablet   simvastatin (ZOCOR) 20 MG tablet    hydrochlorothiazide (HYDRODIURIL) 25 MG tablet   Other Relevant Orders   COMPLETE METABOLIC PANEL WITH GFR (Completed)     Endocrine   Type 2 diabetes mellitus with hyperglycemia, without long-term current use of insulin (HCC) - Primary ControlledDM with A1c 6.5 stable from 6.3% 3 months ago and goal A1c < 7.0%. - Complications - dyslipidemia, peripheral neuropathy.  Plan:  1. Continue current therapy: metformin 2. Encourage improved lifestyle: - low carb/low glycemic diet reinforced prior education - Increase physical activity to 30 minutes most days of the week.  Explained that increased physical activity increases body's use of sugar for energy. 3. Check fasting am CBG and bring log to next visit for review 4. Continue ASA, ARB and Statin 5. Advised to schedule DM ophtho exam, send record. 6. Follow-up 6 months     Relevant Medications   metFORMIN (GLUCOPHAGE) 500 MG tablet   losartan (COZAAR) 100 MG tablet   simvastatin (ZOCOR) 20 MG tablet   Other Relevant Orders   POCT glycosylated hemoglobin (Hb A1C) (Completed)   Lipid panel (Completed)   COMPLETE METABOLIC PANEL WITH GFR (Completed)    Other Visit Diagnoses    Neuropathic pain     Stable today on exam.  Medications tolerated without side effects.  Continue at current doses.  Refills provided.  Followup 6 months.    Relevant Medications   gabapentin (NEURONTIN) 300 MG capsule   Dyslipidemia     Previously at goals. Current status nearly controlled.  LDL not at goal of < 70.    Plan: 1. Encouraged lifestyle and diet changes 2. Continue simvastatin 20 mg once daily 3. Manage DM  4. Labs today 5. Follow-up 6 months.   Relevant Medications   simvastatin (ZOCOR) 20 MG tablet   Other Relevant Orders   Lipid panel (Completed)   TSH (Completed)      Meds ordered this encounter  Medications  . gabapentin (NEURONTIN) 300 MG capsule    Sig: Take 2 capsules (600 mg total) by mouth daily as needed (shingles pain).     Dispense:  30 capsule    Refill:  1    Order Specific Question:   Supervising Provider    Answer:   Smitty Cords [[id number]]  . amLODipine (NORVASC) 10 MG tablet    Sig: Take 1 tablet (10 mg total) by mouth daily.    Dispense:  90 tablet    Refill:  1    Order Specific Question:   Supervising Provider    Answer:   Smitty Cords [[id number]]  . DISCONTD: hydrochlorothiazide (HYDRODIURIL) 25 MG tablet    Sig: Take 1 tablet (25 mg total) by mouth daily.    Dispense:  90 tablet    Refill:  1    Order Specific Question:   Supervising Provider    Answer:   Smitty Cords [[id number]]  . DISCONTD: losartan (COZAAR) 100 MG tablet    Sig: Take 1 tablet (100 mg total) by mouth daily.    Dispense:  90 tablet    Refill:  1    Order Specific Question:   Supervising Provider    Answer:   Smitty Cords [[id number]]  . DISCONTD: metFORMIN (GLUCOPHAGE) 500 MG tablet    Sig: Take 1 tablet (500 mg total) by mouth 2 (two) times daily with a meal.    Dispense:  180 tablet    Refill:  1  Order Specific Question:   Supervising Provider    Answer:   Smitty CordsKARAMALEGOS, ALEXANDER J [[id number]]  . DISCONTD: simvastatin (ZOCOR) 20 MG tablet    Sig: Take 1 tablet (20 mg total) by mouth at bedtime.    Dispense:  90 tablet    Refill:  1    Order Specific Question:   Supervising Provider    Answer:   Smitty CordsKARAMALEGOS, ALEXANDER J [[id number]]  . metFORMIN (GLUCOPHAGE) 500 MG tablet    Sig: Take 1 tablet (500 mg total) by mouth 2 (two) times daily with a meal.    Dispense:  180 tablet    Refill:  1    Order Specific Question:   Supervising Provider    Answer:   Smitty CordsKARAMALEGOS, ALEXANDER J [[id number]]  . losartan (COZAAR) 100 MG tablet    Sig: Take 1 tablet (100 mg total) by mouth daily.    Dispense:  90 tablet    Refill:  1    Order Specific Question:   Supervising Provider    Answer:   Smitty CordsKARAMALEGOS, ALEXANDER J [[id number]]  . simvastatin (ZOCOR) 20 MG tablet    Sig: Take 1 tablet (20 mg total) by mouth at bedtime.     Dispense:  90 tablet    Refill:  1    Order Specific Question:   Supervising Provider    Answer:   Smitty CordsKARAMALEGOS, ALEXANDER J [[id number]]  . hydrochlorothiazide (HYDRODIURIL) 25 MG tablet    Sig: Take 1 tablet (25 mg total) by mouth daily.    Dispense:  90 tablet    Refill:  1    Order Specific Question:   Supervising Provider    Answer:   Smitty CordsKARAMALEGOS, ALEXANDER J [[id number]]    Follow up plan: Return in about 6 months (around 12/30/2018) for hypertension, diabetes.  Wilhelmina McardleLauren Ardene Remley, DNP, AGPCNP-BC Adult Gerontology Primary Care Nurse Practitioner [facility] 06/29/2018, 8:17 AM

## 2018-07-02 ENCOUNTER — Encounter: Payer: Self-pay | Admitting: Nurse Practitioner

## 2018-07-22 ENCOUNTER — Other Ambulatory Visit: Payer: Self-pay | Admitting: Nurse Practitioner

## 2018-07-22 DIAGNOSIS — G4733 Obstructive sleep apnea (adult) (pediatric): Secondary | ICD-10-CM

## 2018-08-09 ENCOUNTER — Ambulatory Visit: Payer: Medicaid Other | Admitting: Nurse Practitioner

## 2018-11-04 ENCOUNTER — Encounter: Admit: 2018-11-04 | Discharge: 2018-11-05 | Payer: PRIVATE HEALTH INSURANCE | Attending: Medical | Primary: Medical

## 2018-11-04 DIAGNOSIS — Z1211 Encounter for screening for malignant neoplasm of colon: Secondary | ICD-10-CM

## 2018-11-04 DIAGNOSIS — I10 Essential (primary) hypertension: Secondary | ICD-10-CM

## 2018-11-04 DIAGNOSIS — M549 Dorsalgia, unspecified: Secondary | ICD-10-CM

## 2018-11-04 DIAGNOSIS — E1165 Type 2 diabetes mellitus with hyperglycemia: Secondary | ICD-10-CM

## 2018-11-04 DIAGNOSIS — Z6841 Body Mass Index (BMI) 40.0 and over, adult: Secondary | ICD-10-CM

## 2018-11-04 DIAGNOSIS — Z7689 Persons encountering health services in other specified circumstances: Principal | ICD-10-CM

## 2018-11-04 DIAGNOSIS — Z1239 Encounter for other screening for malignant neoplasm of breast: Secondary | ICD-10-CM

## 2018-11-04 MED ORDER — GABAPENTIN 300 MG CAPSULE: 600 mg | capsule | Freq: Two times a day (BID) | 1 refills | 90 days | Status: AC

## 2018-11-04 MED ORDER — GABAPENTIN 300 MG CAPSULE
ORAL_CAPSULE | Freq: Two times a day (BID) | ORAL | 1 refills | 90 days | Status: CP
Start: 2018-11-04 — End: 2019-11-04

## 2018-11-04 MED ORDER — METFORMIN ER 500 MG TABLET,EXTENDED RELEASE 24 HR
ORAL_TABLET | Freq: Two times a day (BID) | ORAL | 3 refills | 90 days | Status: CP
Start: 2018-11-04 — End: 2019-11-04

## 2018-11-09 ENCOUNTER — Encounter: Admit: 2018-11-09 | Discharge: 2018-11-10 | Payer: PRIVATE HEALTH INSURANCE

## 2018-11-09 DIAGNOSIS — R21 Rash and other nonspecific skin eruption: Principal | ICD-10-CM

## 2018-11-09 DIAGNOSIS — Z79899 Other long term (current) drug therapy: Secondary | ICD-10-CM

## 2018-11-12 ENCOUNTER — Ambulatory Visit: Admit: 2018-11-12 | Discharge: 2018-11-13

## 2018-11-12 DIAGNOSIS — Z1239 Encounter for other screening for malignant neoplasm of breast: Principal | ICD-10-CM

## 2018-11-15 MED ORDER — TERBINAFINE HCL 250 MG TABLET
ORAL_TABLET | 0 refills | 0 days | Status: CP
Start: 2018-11-15 — End: ?

## 2018-11-18 ENCOUNTER — Ambulatory Visit: Admit: 2018-11-18 | Discharge: 2018-11-19

## 2018-11-18 ENCOUNTER — Ambulatory Visit: Admit: 2018-11-18 | Discharge: 2018-11-19 | Attending: Medical | Primary: Medical

## 2018-11-18 DIAGNOSIS — M25552 Pain in left hip: Secondary | ICD-10-CM

## 2018-11-18 DIAGNOSIS — M25551 Pain in right hip: Secondary | ICD-10-CM

## 2018-11-18 DIAGNOSIS — M549 Dorsalgia, unspecified: Principal | ICD-10-CM

## 2018-11-18 DIAGNOSIS — Z23 Encounter for immunization: Secondary | ICD-10-CM

## 2018-11-18 DIAGNOSIS — E1165 Type 2 diabetes mellitus with hyperglycemia: Principal | ICD-10-CM

## 2018-11-30 ENCOUNTER — Telehealth: Admit: 2018-11-30 | Discharge: 2018-12-01 | Payer: PRIVATE HEALTH INSURANCE

## 2018-11-30 DIAGNOSIS — M4316 Spondylolisthesis, lumbar region: Principal | ICD-10-CM

## 2018-11-30 DIAGNOSIS — M545 Low back pain: Secondary | ICD-10-CM

## 2018-11-30 DIAGNOSIS — G8929 Other chronic pain: Secondary | ICD-10-CM

## 2018-11-30 DIAGNOSIS — M549 Dorsalgia, unspecified: Secondary | ICD-10-CM

## 2018-12-06 ENCOUNTER — Telehealth
Admit: 2018-12-06 | Discharge: 2019-01-04 | Attending: Rehabilitative and Restorative Service Providers" | Primary: Rehabilitative and Restorative Service Providers"

## 2018-12-06 DIAGNOSIS — M545 Low back pain: Secondary | ICD-10-CM

## 2018-12-06 DIAGNOSIS — M4316 Spondylolisthesis, lumbar region: Secondary | ICD-10-CM

## 2018-12-06 DIAGNOSIS — G8929 Other chronic pain: Secondary | ICD-10-CM

## 2018-12-24 ENCOUNTER — Telehealth: Admit: 2018-12-24 | Discharge: 2018-12-25

## 2018-12-24 DIAGNOSIS — Z6841 Body Mass Index (BMI) 40.0 and over, adult: Secondary | ICD-10-CM

## 2018-12-24 DIAGNOSIS — I10 Essential (primary) hypertension: Secondary | ICD-10-CM

## 2018-12-24 DIAGNOSIS — E1165 Type 2 diabetes mellitus with hyperglycemia: Secondary | ICD-10-CM

## 2018-12-24 DIAGNOSIS — G4730 Sleep apnea, unspecified: Secondary | ICD-10-CM

## 2018-12-24 MED ORDER — METFORMIN ER 500 MG TABLET,EXTENDED RELEASE 24 HR
ORAL_TABLET | Freq: Every day | ORAL | 1 refills | 45 days | Status: CP
Start: 2018-12-24 — End: ?

## 2018-12-30 ENCOUNTER — Ambulatory Visit: Payer: Medicaid Other | Admitting: Nurse Practitioner

## 2019-02-03 ENCOUNTER — Ambulatory Visit
Admit: 2019-02-03 | Discharge: 2019-02-04 | Attending: Student in an Organized Health Care Education/Training Program | Primary: Student in an Organized Health Care Education/Training Program

## 2019-02-03 DIAGNOSIS — E1165 Type 2 diabetes mellitus with hyperglycemia: Principal | ICD-10-CM

## 2019-02-03 DIAGNOSIS — H04123 Dry eye syndrome of bilateral lacrimal glands: Principal | ICD-10-CM

## 2019-02-10 ENCOUNTER — Ambulatory Visit: Admit: 2019-02-10 | Discharge: 2019-02-11 | Attending: Medical | Primary: Medical

## 2019-02-10 DIAGNOSIS — Z23 Encounter for immunization: Principal | ICD-10-CM

## 2019-02-10 DIAGNOSIS — Z6841 Body Mass Index (BMI) 40.0 and over, adult: Principal | ICD-10-CM

## 2019-02-10 DIAGNOSIS — Z029 Encounter for administrative examinations, unspecified: Principal | ICD-10-CM

## 2019-02-10 DIAGNOSIS — M549 Dorsalgia, unspecified: Principal | ICD-10-CM

## 2019-02-10 DIAGNOSIS — E1165 Type 2 diabetes mellitus with hyperglycemia: Principal | ICD-10-CM

## 2019-02-10 DIAGNOSIS — I10 Essential (primary) hypertension: Principal | ICD-10-CM

## 2019-02-10 MED ORDER — AMLODIPINE 10 MG TABLET
ORAL_TABLET | Freq: Every day | ORAL | 3 refills | 90.00000 days | Status: CP
Start: 2019-02-10 — End: ?

## 2019-02-10 MED ORDER — LOSARTAN 100 MG TABLET
ORAL_TABLET | Freq: Every day | ORAL | 3 refills | 90.00000 days | Status: CP
Start: 2019-02-10 — End: ?

## 2019-02-10 MED ORDER — METFORMIN ER 500 MG TABLET,EXTENDED RELEASE 24 HR
ORAL_TABLET | Freq: Every day | ORAL | 3 refills | 90.00000 days | Status: CP
Start: 2019-02-10 — End: ?

## 2019-02-10 MED ORDER — SIMVASTATIN 20 MG TABLET
ORAL_TABLET | Freq: Every evening | ORAL | 3 refills | 90.00000 days | Status: CP
Start: 2019-02-10 — End: ?

## 2019-02-10 MED ORDER — GABAPENTIN 300 MG CAPSULE
ORAL_CAPSULE | Freq: Two times a day (BID) | ORAL | 3 refills | 90.00000 days | Status: CP
Start: 2019-02-10 — End: 2020-02-10

## 2019-02-10 MED ORDER — HYDROCHLOROTHIAZIDE 25 MG TABLET
ORAL_TABLET | Freq: Every day | ORAL | 3 refills | 90 days | Status: CP
Start: 2019-02-10 — End: ?

## 2019-02-22 ENCOUNTER — Ambulatory Visit: Admit: 2019-02-22 | Discharge: 2019-02-23

## 2019-02-22 DIAGNOSIS — L409 Psoriasis, unspecified: Principal | ICD-10-CM

## 2019-02-22 DIAGNOSIS — B351 Tinea unguium: Principal | ICD-10-CM

## 2019-02-22 MED ORDER — COSENTYX PEN 300 MG/2 PENS (150 MG/ML) SUBCUTANEOUS
SUBCUTANEOUS | PRN refills | 196 days | Status: CP
Start: 2019-02-22 — End: ?

## 2019-04-05 ENCOUNTER — Ambulatory Visit: Admit: 2019-04-05 | Discharge: 2019-04-06 | Attending: Family | Primary: Family

## 2019-05-31 DIAGNOSIS — L409 Psoriasis, unspecified: Principal | ICD-10-CM

## 2019-06-01 MED ORDER — COSENTYX PEN 300 MG/2 PENS (150 MG/ML) SUBCUTANEOUS
SUBCUTANEOUS | PRN refills | 196.00000 days | Status: CP
Start: 2019-06-01 — End: ?
  Filled 2019-08-16: qty 2, 28d supply, fill #0

## 2019-06-02 DIAGNOSIS — L409 Psoriasis, unspecified: Principal | ICD-10-CM

## 2019-06-21 ENCOUNTER — Ambulatory Visit: Admit: 2019-06-21 | Discharge: 2019-06-22 | Attending: Medical | Primary: Medical

## 2019-06-21 DIAGNOSIS — Z1159 Encounter for screening for other viral diseases: Principal | ICD-10-CM

## 2019-06-21 DIAGNOSIS — I10 Essential (primary) hypertension: Principal | ICD-10-CM

## 2019-06-21 DIAGNOSIS — E1165 Type 2 diabetes mellitus with hyperglycemia: Principal | ICD-10-CM

## 2019-06-21 DIAGNOSIS — Z111 Encounter for screening for respiratory tuberculosis: Principal | ICD-10-CM

## 2019-06-21 MED ORDER — LIRAGLUTIDE 0.6 MG/0.1 ML (18 MG/3 ML) SUBCUTANEOUS PEN INJECTOR
SUBCUTANEOUS | 0 refills | 106 days | Status: CP
Start: 2019-06-21 — End: 2019-10-05

## 2019-06-23 ENCOUNTER — Encounter: Admit: 2019-06-23 | Discharge: 2019-06-24 | Payer: PRIVATE HEALTH INSURANCE

## 2019-06-23 DIAGNOSIS — Z111 Encounter for screening for respiratory tuberculosis: Principal | ICD-10-CM

## 2019-06-30 MED ORDER — LIRAGLUTIDE 0.6 MG/0.1 ML (18 MG/3 ML) SUBCUTANEOUS PEN INJECTOR
SUBCUTANEOUS | 1 refills | 106 days | Status: CP
Start: 2019-06-30 — End: 2019-10-14

## 2019-08-02 ENCOUNTER — Telehealth: Payer: Self-pay

## 2019-08-02 NOTE — Telephone Encounter (Signed)
The pt was calling to schedule an appt for her husband.

## 2019-08-04 ENCOUNTER — Encounter: Admit: 2019-08-04 | Discharge: 2019-08-05 | Payer: PRIVATE HEALTH INSURANCE | Attending: Medical | Primary: Medical

## 2019-08-04 DIAGNOSIS — G4730 Sleep apnea, unspecified: Principal | ICD-10-CM

## 2019-08-04 DIAGNOSIS — I10 Essential (primary) hypertension: Principal | ICD-10-CM

## 2019-08-04 DIAGNOSIS — Z6841 Body Mass Index (BMI) 40.0 and over, adult: Principal | ICD-10-CM

## 2019-08-04 DIAGNOSIS — E1165 Type 2 diabetes mellitus with hyperglycemia: Principal | ICD-10-CM

## 2019-08-04 MED ORDER — OZEMPIC 0.25 MG OR 0.5 MG (2 MG/1.5 ML) SUBCUTANEOUS PEN INJECTOR
PEN_INJECTOR | SUBCUTANEOUS | 1 refills | 127.00000 days | Status: CP
Start: 2019-08-04 — End: 2019-12-09

## 2019-08-05 DIAGNOSIS — L409 Psoriasis, unspecified: Principal | ICD-10-CM

## 2019-08-15 MED ORDER — EMPTY CONTAINER
2 refills | 0 days
Start: 2019-08-15 — End: ?

## 2019-08-15 NOTE — Unmapped (Signed)
Tracy Robles declined injection training, since she has been using the medication from the manufacturer. I verified she is familiar with the auto-pen.     [facility]   Patient Onboarding/Medication Counseling    Tracy Robles is a 54 y.o. female with Cosentyx who I am counseling today on continuation of therapy.  I am speaking to the patient.    Was a Nurse, learning disability used for this call? No    Verified patient's date of birth / HIPAA.    Specialty medication(s) to be sent: Inflammatory Disorders: Cosentyx      Non-specialty medications/supplies to be sent: sharps kit      Medications not needed at this time: na         Cosentyx (secukinumab)    Medication & Administration     Dosage: psoriasis maintenance dosing: 300 mg (inject the contents of 2 pens) under the skin every 28 days      Lab tests required prior to treatment initiation:  ??? Tuberculosis: Tuberculosis screening resulted in a non-reactive TB skin test.      Administration:     Prefilled Sensoready?? auto-injector pen  1. Gather all supplies needed for injection on a clean, flat working surface: medication pen removed from packaging, alcohol swab, sharps container, etc.  2. Look at the medication label ??? look for correct medication, correct dose, and check the expiration date  3. Look at the medication ??? the liquid visible in the window on the side of the pen device should appear clear and colorless to slightly yellow  4. Lay the auto-injector pen on a flat surface and allow it to warm up to room temperature for at least 15-30 minutes  5. Select injection site ??? you can use the front of your thigh or your belly (but not the area 2 inches around your belly button); if someone else is giving you the injection you can also use your upper arm in the skin covering your triceps muscle  6. Prepare injection site ??? wash your hands and clean the skin at the injection site with an alcohol swab and let it air dry, do not touch the injection site again before the injection  7. Twist off the purple safety cap in the direction of the arrow, do not remove until immediately prior to injection and do not touch the yellow needle cover  8. Put the white needle cover against your skin at the injection site at a 90 degree angle, hold the pen such that you can see the clear medication window  9. Press down and hold the pen firmly against your skin, there will be a click when the injection starts  10. Continue to hold the pen firmly against your skin for about 10-15 seconds ??? the window will start to turn solid green  11. There will be a second click sound when the injection is almost complete, verify the window is solid green to indicate the injection is complete and then pull the pen away from your skin  12. Dispose of the used auto-injector pen immediately in your sharps disposal container the needle will be covered automatically  13. If you see any blood at the injection site, press a cotton ball or gauze on the site and maintain pressure until the bleeding stops, do not rub the injection site      Adherence/Missed dose instructions:  If your injection is given more than 4 days after your scheduled injection date ??? consult your pharmacist for additional instructions  on how to adjust your dosing schedule.        Goals of Therapy     Ankylosing spondylitis  ??? Relief of symptoms  ??? Maintenance of function  ??? Prevention of complications of spinal disease  ??? Minimization of extraspinal and extraarticular manifestations and comorbidities  ??? Maintenance of effective psychosocial functioning    Plaque Psoriasis  ??? Minimize areas of skin involvement (% BSA)  ??? Avoidance of long term glucocorticoid use  ??? Maintenance of effective psychosocial functioning    Psoriatic arthritis  ??? Achieve remission/inactive disease or low/minimal disease activity  ??? Maintenance of function  ??? Minimization of systemic manifestations and comorbidities  ??? Maintenance of effective psychosocial functioning Side Effects & Monitoring Parameters     ??? Injection site reaction (redness, irritation, inflammation localized to the site of administration)  ??? Signs of a common cold ??? minor sore throat, runny or stuffy nose, etc.  ??? Diarrhea    The following side effects should be reported to the provider:  ??? Signs of a hypersensitivity reaction ??? rash; hives; itching; red, swollen, blistered, or peeling skin; wheezing; tightness in the chest or throat; difficulty breathing, swallowing, or talking; swelling of the mouth, face, lips, tongue, or throat; etc.  ??? Reduced immune function ??? report signs of infection such as fever; chills; body aches; very bad sore throat; ear or sinus pain; cough; more sputum or change in color of sputum; pain with passing urine; wound that will not heal, etc.  Also at a slightly higher risk of some malignancies (mainly skin and blood cancers) due to this reduced immune function.  o In the case of signs of infection ??? the patient should hold the next dose of Cosentyx?? and call your primary care provider to ensure adequate medical care.  Treatment may be resumed when infection is treated and patient is asymptomatic.  ??? Muscle pain or weakness  ??? Shortness of breath      Warnings, Precautions, & Contraindications     ??? Have your bloodwork checked as you have been told by your prescriber  ??? Talk with your doctor if you are pregnant, planning to become pregnant, or breastfeeding  ??? Discuss the possible need for holding your dose(s) of Cosentyx?? when a planned procedure is scheduled with the prescriber as it may delay healing/recovery timeline       Drug/Food Interactions     ??? Medication list reviewed in Epic. The patient was instructed to inform the care team before taking any new medications or supplements. No drug interactions identified.   ??? If you have a latex allergy use caution when handling, the needle cap of the Cosentyx?? prefilled syringe and the safety cap for the Cosentyx Sensoready?? pen contains a derivative of natural rubber latex  ??? Talk with you prescriber or pharmacist before receiving any live vaccinations while taking this medication and after you stop taking it      Storage, Handling Precautions, & Disposal     ??? Store this medication in the refrigerator.  Do not freeze  ??? If needed, you may store at room temperature for up to 1 hour  ??? Store in original packaging, protected from light  ??? Do not shake  ??? Dispose of used syringes/pens in a sharps disposal container           Current Medications (including OTC/herbals), Comorbidities and Allergies     Current Outpatient Medications   Medication Sig Dispense Refill   ??? amLODIPine (NORVASC) 10  MG tablet Take 1 tablet (10 mg total) by mouth daily. 90 tablet 3   ??? biotin 1 mg cap Take by mouth.     ??? clobetasol (TEMOVATE) 0.05 % external solution Apply topically to scalp twice daily. 50 mL 6   ??? clobetasol (TEMOVATE) 0.05 % ointment Apply topically to rash on body twice daily. 60 g 6   ??? gabapentin (NEURONTIN) 300 MG capsule Take 2 capsules (600 mg total) by mouth two (2) times a day. 360 capsule 3   ??? hydroCHLOROthiazide (HYDRODIURIL) 25 MG tablet Take 1 tablet (25 mg total) by mouth daily. 90 tablet 3   ??? losartan (COZAAR) 100 MG tablet Take 1 tablet (100 mg total) by mouth daily. 90 tablet 3   ??? metFORMIN (GLUCOPHAGE-XR) 500 MG 24 hr tablet Take 3 tablets (1,500 mg total) by mouth daily with evening meal. 270 tablet 3   ??? multivitamin (MULTIVITAMIN) per tablet Take 1 tablet by mouth.     ??? omega-3 acid ethyl esters (LOVAZA) 1 gram capsule Take 1,000 mg by mouth.     ??? secukinumab (COSENTYX PEN, 2 PENS,) 150 mg/mL PnIj injection Inject the contents of 2 pens (300 mg total) under the skin every twenty-eight (28) days. 14 mL PRN   ??? semaglutide (OZEMPIC) 0.25 mg or 0.5 mg(2 mg/1.5 mL) PnIj Inject 0.25 mg under the skin every seven (7) days for 28 days, THEN 0.5 mg every seven (7) days. 4 pen 1   ??? simvastatin (ZOCOR) 20 MG tablet Take 1 tablet (20 mg total) by mouth nightly. 90 tablet 3   ??? terbinafine HCL (LAMISIL) 250 mg tablet Take one tablet daily for 12 weeks. 84 tablet 0   ??? triamcinolone (KENALOG) 0.1 % ointment Frequency:PHARMDIR   Dosage:0.0     Instructions:  Note:apply to affected areas twice daily until smooth. Dose: 0.1%     ??? triamcinolone (KENALOG) 0.5 % cream Apply topically.     ??? vitamin E 1000 UNIT capsule Take 1,000 Units by mouth.       No current facility-administered medications for this visit.       Allergies   Allergen Reactions   ??? Lisinopril Cough   ??? Latex      Other reaction(s): UNKNOWN   ??? Penicillins      Other reaction(s): UNSPECIFIED       Patient Active Problem List   Diagnosis   ??? BMI 60.0-69.9, adult (CMS-HCC)   ??? Eczema   ??? Hypertension   ??? Psoriasis   ??? Sleep apnea   ??? Type 2 diabetes mellitus with hyperglycemia, without long-term current use of insulin (CMS-HCC)   ??? Encounter to establish care   ??? Back pain   ??? Administrative encounter       Reviewed and up to date in Epic.    Appropriateness of Therapy     Is medication and dose appropriate based on diagnosis? Yes    Prescription has been clinically reviewed: Yes    Baseline Quality of Life Assessment      How many days over the past month did your psoriasis  keep you from your normal activities? For example, brushing your teeth or getting up in the morning. 0    Financial Information     Medication Assistance provided: Prior Authorization    Anticipated copay of $0 (after patient obtained copay card) reviewed with patient. Verified delivery address.    Delivery Information     Scheduled delivery date: Tues, May 4  Expected start date: Tues, May 4    Medication will be delivered via Same Day Courier to the prescription address in [place].  This shipment will not require a signature.      Explained the services we provide at [facility] and that each month we would call to set up refills.  Stressed importance of returning phone calls so that we could ensure they receive their medications in time each month.  Informed patient that we should be setting up refills 7-10 days prior to when they will run out of medication.  A pharmacist will reach out to perform a clinical assessment periodically.  Informed patient that a welcome packet and a drug information handout will be sent.      Patient verbalized understanding of the above information as well as how to contact the pharmacy at [phone] option 4 with any questions/concerns.  The pharmacy is open Monday through Friday 8:30am-4:30pm.  A pharmacist is available 24/7 via pager to answer any clinical questions they may have.    Patient Specific Needs     - Does the patient have any physical, cognitive, or cultural barriers? No    - Patient prefers to have medications discussed with  Patient     - Is the patient or caregiver able to read and understand education materials at a high school level or above? Yes    - Patient's primary language is  English     - Is the patient high risk? No     - Does the patient require a Care Management Plan? No     - Does the patient require physician intervention or other additional services (i.e. nutrition, smoking cessation, social work)? No      Brent Taillon A Desiree Lucy [facility] Specialty Pharmacist

## 2019-08-15 NOTE — Unmapped (Signed)
[facility] Specialty Medication Onboarding    Specialty Medication: COSENTYX PENS 300MG  EVERY 4 WEEKS  Prior Authorization: Approved   Financial Assistance: Yes - copay card approved as secondary   Final Copay/Day Supply: $0 / 28 DAYS    Insurance Restrictions: Yes - max 1 month supply     Notes to Pharmacist: Patient obtained copay card.     The triage team has completed the benefits investigation and has determined that the patient is able to fill this medication at [facility]. Please contact the patient to complete the onboarding or follow up with the prescribing physician as needed.

## 2019-08-16 MED FILL — EMPTY CONTAINER: 120 days supply | Qty: 1 | Fill #0 | Status: AC

## 2019-08-16 MED FILL — COSENTYX PEN 300 MG/2 PENS (150 MG/ML) SUBCUTANEOUS: 28 days supply | Qty: 2 | Fill #0 | Status: AC

## 2019-08-16 MED FILL — EMPTY CONTAINER: 120 days supply | Qty: 1 | Fill #0

## 2019-08-26 DIAGNOSIS — E1165 Type 2 diabetes mellitus with hyperglycemia: Principal | ICD-10-CM

## 2019-08-26 DIAGNOSIS — Z6841 Body Mass Index (BMI) 40.0 and over, adult: Principal | ICD-10-CM

## 2019-08-26 MED ORDER — OZEMPIC 0.25 MG OR 0.5 MG (2 MG/1.5 ML) SUBCUTANEOUS PEN INJECTOR
PEN_INJECTOR | SUBCUTANEOUS | 1 refills | 127.00000 days | Status: CP
Start: 2019-08-26 — End: 2019-12-31

## 2019-09-07 NOTE — Unmapped (Signed)
[facility] Refill Coordination Note    Specialty Medication(s) to be Shipped:   Inflammatory Disorders: Cosentyx    Other medication(s) to be shipped: n/a     Tracy Robles, DOB: [birth year]  Phone: [phone] (home)       All above HIPAA information was verified with patient.     Was a Nurse, learning disability used for this call? No    Completed refill call assessment today to schedule patient's medication shipment from the [facility] [phone]).       Specialty medication(s) and dose(s) confirmed: Regimen is correct and unchanged.   Changes to medications: Tracy Robles reports no changes at this time.  Changes to insurance: No  Questions for the pharmacist: No    Confirmed patient received Welcome Packet with first shipment. The patient will receive a drug information handout for each medication shipped and additional FDA Medication Guides as required.       DISEASE/MEDICATION-SPECIFIC INFORMATION        For patients on injectable medications: Patient currently has 0 doses left.  Next injection is scheduled for 6/1.    SPECIALTY MEDICATION ADHERENCE     Medication Adherence    Patient reported X missed doses in the last month: 0  Specialty Medication: Cosentyx 150 mg/ml  Patient is on additional specialty medications: No                SHIPPING     Shipping address confirmed in Epic.     Delivery Scheduled: Yes, Expected medication delivery date: 6/1.     Medication will be delivered via Same Day Courier to the prescription address in Epic [facility].    Tracy Robles   [facility] Specialty Technician

## 2019-09-13 MED FILL — COSENTYX PEN 300 MG/2 PENS (150 MG/ML) SUBCUTANEOUS: SUBCUTANEOUS | 28 days supply | Qty: 2 | Fill #1

## 2019-09-13 MED FILL — COSENTYX PEN 300 MG/2 PENS (150 MG/ML) SUBCUTANEOUS: 28 days supply | Qty: 2 | Fill #1 | Status: AC

## 2019-10-05 NOTE — Unmapped (Signed)
[facility] Refill Coordination Note    Specialty Medication(s) to be Shipped:   Inflammatory Disorders: Cosentyx    Other medication(s) to be shipped: n/a     Tracy Robles, DOB: [birth year]  Phone: [phone] (home)       All above HIPAA information was verified with patient.     Was a Nurse, learning disability used for this call? No    Completed refill call assessment today to schedule patient's medication shipment from the [facility] [phone]).       Specialty medication(s) and dose(s) confirmed: Regimen is correct and unchanged.   Changes to medications: Tracy Robles reports no changes at this time.  Changes to insurance: No  Questions for the pharmacist: No    Confirmed patient received Welcome Packet with first shipment. The patient will receive a drug information handout for each medication shipped and additional FDA Medication Guides as required.       DISEASE/MEDICATION-SPECIFIC INFORMATION        For patients on injectable medications: Patient currently has 0 doses left.  Next injection is scheduled for 7/1.    SPECIALTY MEDICATION ADHERENCE     Medication Adherence    Patient reported X missed doses in the last month: 0  Specialty Medication: Cosentyx 150 mg/ml  Patient is on additional specialty medications: No                SHIPPING     Shipping address confirmed in Epic.     Delivery Scheduled: Yes, Expected medication delivery date: 6/28.     Medication will be delivered via Same Day Courier to the prescription address in Epic WAM.    Tracy Robles   [facility] Specialty Technician

## 2019-10-10 MED FILL — COSENTYX PEN 300 MG/2 PENS (150 MG/ML) SUBCUTANEOUS: 28 days supply | Qty: 2 | Fill #2 | Status: AC

## 2019-10-10 MED FILL — COSENTYX PEN 300 MG/2 PENS (150 MG/ML) SUBCUTANEOUS: SUBCUTANEOUS | 28 days supply | Qty: 2 | Fill #2

## 2019-10-13 DIAGNOSIS — E1165 Type 2 diabetes mellitus with hyperglycemia: Principal | ICD-10-CM

## 2019-10-13 DIAGNOSIS — Z6841 Body Mass Index (BMI) 40.0 and over, adult: Principal | ICD-10-CM

## 2019-10-13 MED ORDER — OZEMPIC 0.25 MG OR 0.5 MG (2 MG/1.5 ML) SUBCUTANEOUS PEN INJECTOR
SUBCUTANEOUS | 0 refills | 127.00000 days | Status: CP
Start: 2019-10-13 — End: 2020-02-17

## 2019-11-07 NOTE — Unmapped (Signed)
[facility] Refill Coordination Note    Specialty Medication(s) to be Shipped:   Inflammatory Disorders: Cosentyx    Other medication(s) to be shipped: No additional medications requested for fill at this time     Tracy Robles, DOB: 1966-02-12  Phone: [phone] (home)       All above HIPAA information was verified with patient.     Was a Nurse, learning disability used for this call? No    Completed refill call assessment today to schedule patient's medication shipment from the [facility] [phone]).       Specialty medication(s) and dose(s) confirmed: Regimen is correct and unchanged.   Changes to medications: Khamora reports no changes at this time.  Changes to insurance: No  Questions for the pharmacist: No    Confirmed patient received Welcome Packet with first shipment. The patient will receive a drug information handout for each medication shipped and additional FDA Medication Guides as required.       DISEASE/MEDICATION-SPECIFIC INFORMATION        For patients on injectable medications: Patient currently has 0 doses left.  Next injection is scheduled for 11/14/2019.    SPECIALTY MEDICATION ADHERENCE     Medication Adherence    Patient reported X missed doses in the last month: 0  Specialty Medication: Cosentyx 150 mg/ml  Patient is on additional specialty medications: No  Any gaps in refill history greater than 2 weeks in the last 3 months: no  Demonstrates understanding of importance of adherence: yes  Informant: patient  Reliability of informant: reliable  Confirmed plan for next specialty medication refill: delivery by pharmacy  Refills needed for supportive medications: not needed                      SHIPPING     Shipping address confirmed in Epic.     Delivery Scheduled: Yes, Expected medication delivery date: 11/14/2019. *patient requested*    Medication will be delivered via Same Day Courier to the prescription address in Epic WAM.    Tracy Robles D Tracy Robles   [facility] Specialty Technician

## 2019-11-14 MED FILL — COSENTYX PEN 300 MG/2 PENS (150 MG/ML) SUBCUTANEOUS: 28 days supply | Qty: 2 | Fill #3 | Status: AC

## 2019-11-14 MED FILL — COSENTYX PEN 300 MG/2 PENS (150 MG/ML) SUBCUTANEOUS: SUBCUTANEOUS | 28 days supply | Qty: 2 | Fill #3

## 2019-11-14 NOTE — Unmapped (Signed)
DERMATOLOGY CLINIC NOTE    ASSESSMENT AND PLAN:     Favor urticaria by history and clinical presentation:  -Discussed condition and expectations.  -Start one 180 mg Allegra in the morning.  -Start one 10 mg Zyrtec or 5 mg size all in the evening.  -Continue topical triamcinolone cream twice daily.  -Recommended clearing up the impetiginization on her toe with her podiatrist.  I let her know that she can talk with him about mupirocin ointment or vinegar soaks or debridement of granulation.  She is seeing Dr. Alberteen Spindle in [place].  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  She can also layer these with Benadryl in the evening as long as this does not make her drowsy for her driving.    Psoriasis, excellent control:  -Continue Cosentyx.     Return to clinic:  4 to 6 weeks for recheck.    CHIEF COMPLAINT:  New itchy rash     HPI:   This is a pleasant 54 y.o. female who last saw me on 02/22/2019 at that time for plaque psoriasis.  At that time it was under excellent control with Cosentyx which she has continued to take.  She also had onychomycosis of her toenails.  This is previously been treated with terbinafine but she did not have any improvement with this.  She is diabetic.  Her HbA1c from March is around 6.5.  She had a PPD placed in March as well which was negative.    She comes in today for new itchy rash of 2 weeks duration.  Unfortunately her husband passed away just over a week ago when she is undergoing a lot of stress.  This is a multifocal rash which comes up and goes in different parts of her body and seems to last only a short time.  It is intensely itchy.  She gets some benefit with Benadryl from it.  She also has had a podiatric procedure about a month ago that she has a little bit of impetiginization around her right great toe and she is following up with the podiatrist tomorrow morning.  She is also try the topical triamcinolone for this rash but she feels like it has not been as beneficial.    We treated her previously with terbinafine for her onychomycosis of her toenails.  She did get some benefit with this but she never got complete resolution and she has some hyperpigmentation and thickening of distal toenails for which she is working with her podiatrist (Dr. Alberteen Spindle) on.     PAST MEDICAL HISTORY:  Diabetes  Psoriasis with consistent biopsy on 01/12/2018  Onychomycosis with clipping consistent from 11/09/2018     MEDICATIONS:   Reviewed in epic.    ALLERGIES:   Lisinopril, latex, penicillins    SOCIAL HISTORY:  She is a bus driver for town of [place]  Husband passed away suddenly about a week ago     REVIEW OF SYSTEMS:  Baseline state of health. No recent illnesses. No other skin complaints.     PHYSICAL EXAMINATION:  Examination in the presence of chaperone:  General: Well-developed, well-nourished. No acute distress.   Neuro: Alert and oriented, answers questions appropriately.  Skin: Examination of the scalp, face, head, neck, chest, back, upper extremities, lower extremities, hands, palms, soles was performed and notable for the following:  Subtle urticarial small patches on lower extremities and upper arms  Impetiginization around right great toe with granulation  Hyperpigmentation and thickening around multiple toenails on bilateral feet     Dictation software was used while making this note. Please excuse any errors made with dictation software.

## 2019-11-15 ENCOUNTER — Encounter: Admit: 2019-11-15 | Discharge: 2019-11-16 | Payer: PRIVATE HEALTH INSURANCE

## 2019-11-15 DIAGNOSIS — L409 Psoriasis, unspecified: Principal | ICD-10-CM

## 2019-11-15 DIAGNOSIS — L509 Urticaria, unspecified: Principal | ICD-10-CM

## 2019-11-15 NOTE — Unmapped (Signed)
Pt called to see about getting the lifeline scan per [facility] it is not a recommended screening but it is up to her we follow a [facility] recommended screenings   Pt stated verbal understanding

## 2019-11-15 NOTE — Unmapped (Addendum)
Mupirocin ointment for toe.    One Allegra in the AM and Zyrtec or Xyzal in the evening. Continue using Triamcinolone cream also.       Hives: Care Instructions  Your Care Instructions  Hives are raised, red, itchy patches of skin. They are also called wheals or welts. They usually have red borders and pale centers. Hives range in size from ?? inch to 3 inches or more across. They may seem to move from place to place on the skin. Several hives may form a large area of raised, red skin.  You can get hives after an insect sting, after taking medicine or eating certain foods, or because of infection or stress. Other causes include plants, things you breathe in, makeup, heat, cold, sunlight, and latex.  You cannot spread hives to other people. Hives may last a few minutes or a few days, but a single spot may last less than 36 hours.  Follow-up care is a key part of your treatment and safety. Be sure to make and go to all appointments, and call your doctor if you are having problems. It's also a good idea to know your test results and keep a list of the medicines you take.  How can you care for yourself at home?  ?? Avoid whatever you think may have caused your hives, such as a certain food or medicine. However, you may not know the cause.  ?? Put a cool, wet towel on the area to relieve itching.  ?? Take an over-the-counter antihistamine, such as diphenhydramine (Benadryl), cetirizine (Zyrtec), or loratadine (Claritin), to help stop the hives and calm the itching. Read and follow directions on the label. These medicines can make you feel sleepy. Do not drive while using them.  ?? Stay away from strong soaps, detergents, and chemicals. These can make itching worse.  When should you call for help?   Call 911 anytime you think you may need emergency care. For example, call if:  ?? ?? You have symptoms of a severe allergic reaction. These may include:  ? Sudden raised, red areas (hives) all over your body.  ? Swelling of the throat, mouth, lips, or tongue.  ? Trouble breathing.  ? Passing out (losing consciousness). Or you may feel very lightheaded or suddenly feel weak, confused, or restless.   Call your doctor now or seek immediate medical care if:  ?? ?? You have symptoms of an allergic reaction, such as:  ? A rash or hives (raised, red areas on the skin).  ? Itching.  ? Swelling.  ? Belly pain, nausea, or vomiting.   ?? ?? You get hives after you start a new medicine.   ?? ?? Hives have not gone away after 24 hours.   Watch closely for changes in your health, and be sure to contact your doctor if:  ?? ?? You do not get better as expected.   Where can you learn more?  Go to [facility] at [email or web address]  Select Patient Education under [employer]. Enter [phone] in the search box to learn more about Hives: Care Instructions.  Current as of: January 31, 2019??????????????????????????????Content Version: 12.9  ?? 2006-2021 Healthwise, Inc[employer].   Care instructions adapted under license by [facility]. If you have questions about a medical condition or this instruction, always ask your healthcare professional. Healthwise, Incorporated disclaims any warranty or liability for your use of this information.

## 2019-11-16 MED ORDER — MUPIROCIN 2 % TOPICAL OINTMENT
0 days
Start: 2019-11-16 — End: ?

## 2019-11-16 NOTE — Unmapped (Signed)
Assessment & Plan        I personally spent 32 minutes face-to-face and non-face-to-face in the care of this patient, which includes all pre, intra, and post visit time on the date of service.     Diagnosis and/or treatment is significant limited by social determinants of health as listed: Financial strain. If appropriate I will connect patient with appropriate staff/resources to address these issues.    Problem List Items Addressed This Visit        Endocrine    Type 2 diabetes mellitus with hyperglycemia, without long-term current use of insulin (CMS-HCC)     Disease status: has been well controlled, but recently off meds  Lab Results   Component Value Date    A1C 6.5 (A) 06/21/2019       Medication:  ?? Restart metformin 1500 mg daily as prescribed  ?? Restart Ozempic 0.25 mg q week x 4 weeks  THEN increase to 0.5 mg every day (for diabetes as well as weight loss).  No family history of medullary thyroid cancer, MEN or pancreatitis. Talk to pharmacist if Ozempic is >$25/mo with drug manufacturer's coupon.    No A1c or other labs today as patient has not been taking metformin or Ozempic.  ??  ADA diet. Exercise daily 30 minutes. Dental exam annually. Foot exam daily/moisturizer daily. Advised to follow up with his/her ophthalmologist annually.  ??  DM Check list:  Foot Exam: 11/18/18  Retinal Exam: 02/03/19, w/o retinopathy  Flu shot: 01/2019  Pneumovax: UTD, 11/18/18  Baseline EKG: none on file at [facility] [place] without acute ST changes noted in [facility] records 07/01/18  Microalbumin: on ARB    LDL:  Zocor 20 mg qd        Lab Results   Component Value Date   ?? LDL Calculated 75 06/21/2019    ASA: no per current guidelines  The 10-year ASCVD risk score Denman George DC Jr., et al., 2013) is: 15.1%           Relevant Medications    semaglutide (OZEMPIC) 0.25 mg or 0.5 mg(2 mg/1.5 mL) PnIj injection    metFORMIN (GLUCOPHAGE-XR) 500 MG 24 hr tablet       Musculoskeletal and Integument    Ingrown toenail of right foot - Primary     Recurrent ingrown toenail - previously treated with course of oral lamisil and partial nail removal.   -Referral to [facility] podiatry for further eval definitive treatment           Relevant Orders    Ambulatory referral to Podiatry       Other    BMI 60.0-69.9, adult (CMS-HCC)     Today???s BP: 122/79. Current BMI: 62.78. restarting GLP-1. Will discuss topiramate in future    Wt Readings from Last 6 Encounters:   11/22/19 (!) 158.3 kg (349 lb)   08/04/19 (!) 157.4 kg (347 lb 0.6 oz)   06/21/19 (!) 156.5 kg (345 lb 1.3 oz)   02/10/19 (!) 158.3 kg (349 lb 0.6 oz)   11/18/18 (!) 163.8 kg (361 lb 1.3 oz)   11/04/18 (!) 164.7 kg (363 lb 1.3 oz)     ??  Goal: To lose 5-10 pounds in 3 months from the present weight: Weight: (!) 158.3 kg (349 lb)  Patient agrees to follow up with me closely for weight check/ lifestyle check.   Diet: Stop drinking soda, sweet tea, juice, and other sugary drinks (if any). Significantly decrease processed carbohydrates, desserts, or unhealthy snacks. Advised  that dietician can help make appropriate manageable recommendations.  Referrals - we have discussed referral to weight management clinic, plan to optimize medical management in our clinic for now  Exercise: Advised to exercise at least 30 minutes per day (walking, treadmill, bicycling, swimming, gym). Advised Pt to start exercising 10 minutes 3 times per week and increase by 5 minutes every 2 weeks until exercising 30 minutes 3 times per week, then add 1 day.           Relevant Medications    semaglutide (OZEMPIC) 0.25 mg or 0.5 mg(2 mg/1.5 mL) PnIj injection    metFORMIN (GLUCOPHAGE-XR) 500 MG 24 hr tablet           Return in about 6 weeks (around 01/03/2020) for Recheck.     Medication adherence and barriers to the treatment plan have been addressed. Opportunities to optimize healthy behaviors have been discussed. Patient / caregiver voiced understanding.      Subjective:     Tracy Robles is a 54 y.o. female who presents for Diabetes, Hypertension, and Depression (loss of spouse)            History of Present Illness     HPI     Depression      Additional comments: loss of spouse          Last edited by Lendell Caprice, CMA on 11/22/2019 10:52 AM. (History)      Patient last seen by me 08/04/19. DM2: at goal, continue metformin 1500 mg daily, start Ozempic 1500 mg qwk x 4wks then increase to 0.5 mg qwk. Sleep Apnea: unable to tolerate CPAP, agreeable to discussing oral device with dentist. Hypertension: not at goal. Patient agrees to get blood pressure cuff for home monitoring. BMI 60-69: starting GLP-1; will discuss topiramate in future. Seen by dermatology 11/15/19. Continue Cosentyx for plaque psoriasis. Start Allegra 180 mg qAM and Zyrtec 5 mg or 10 mg qPM, continue TMC cream twice daily for for urticaria.      Patient's husband recently died unexpectedly of a heart attack. She has good support from family and friends and has grief counseling scheduled this afternoon with [facility] employee health. She returns to work tomorrow. We discussed adjunct medication if necessary during this time. She attributes her high pulse at triage to stress.     Patient completed a course of oral lamisil for toenail fungus, then had the nail of her R big toe cut down by derm. The same nail then became ingrown and podiatry I&D's paronychia as well as debrided granuloma formation; derm cut this down as well, but the nail seems to be becoming ingrown once again.     She has not taken Ozempic for three weeks since running out shortly before her husband's death. She reports that she pays $50 for 2 pens with the drug manufacturer's coupon. She discontinued metformin when she began Ozempic as she was unsure whether or not she was supposed to take both. She is still taking amlodipine, hydrochlorothiazide, and losartan, but says she ran out of simvastatin. She has finished her courses of terbinafine and lamisil. Her appetite remains unchanged and she has been drinking sodas. PHQ-2 Score: 4    PHQ-9 Score: 6  Screening complete, depression identified / today's follow-up action documented in note    Hemoglobin A1C (%)   Date Value   06/21/2019 6.5 (A)   11/18/2018 6.9 (A)     LDL Calculated (mg/dL)   Date Value   16/01/9603  75     BP Readings from Last 3 Encounters:   11/22/19 122/79   08/04/19 158/91   06/21/19 143/86     Wt Readings from Last 3 Encounters:   11/22/19 (!) 158.3 kg (349 lb)   08/04/19 (!) 157.4 kg (347 lb 0.6 oz)   06/21/19 (!) 156.5 kg (345 lb 1.3 oz)           Review of Systems     History obtained from the patient and chart review     ROS negative unless otherwise stated in the HPI:            Review of History     MEDICATIONS:        Current Outpatient Medications on File Prior to Visit   Medication Sig Dispense Refill   ??? amLODIPine (NORVASC) 10 MG tablet Take 1 tablet (10 mg total) by mouth daily. 90 tablet 3   ??? biotin 1 mg cap Take by mouth.     ??? cetirizine (ZYRTEC) 5 MG tablet Take 5 mg by mouth daily.     ??? clobetasol (TEMOVATE) 0.05 % ointment Apply topically to rash on body twice daily. 60 g 6   ??? fexofenadine (ALLEGRA) 60 MG tablet Take 60 mg by mouth Two (2) times a day. Administer with water only; do not administer with fruit juices     ??? gabapentin (NEURONTIN) 300 MG capsule Take 2 capsules (600 mg total) by mouth two (2) times a day. 360 capsule 3   ??? hydroCHLOROthiazide (HYDRODIURIL) 25 MG tablet Take 1 tablet (25 mg total) by mouth daily. 90 tablet 3   ??? losartan (COZAAR) 100 MG tablet Take 1 tablet (100 mg total) by mouth daily. 90 tablet 3   ??? secukinumab (COSENTYX PEN, 2 PENS,) 150 mg/mL PnIj injection Inject the contents of 2 pens (300 mg total) under the skin every twenty-eight (28) days. 14 mL PRN   ??? triamcinolone (KENALOG) 0.1 % ointment Frequency:PHARMDIR   Dosage:0.0     Instructions:  Note:apply to affected areas twice daily until smooth. Dose: 0.1%     ??? triamcinolone (KENALOG) 0.5 % cream Apply topically.     ??? vitamin E 1000 UNIT capsule Take 1,000 Units by mouth.     ??? clobetasol (TEMOVATE) 0.05 % external solution Apply topically to scalp twice daily. (Patient not taking: Reported on 11/22/2019) 50 mL 6   ??? empty container Misc Use as directed to dispose of Cosentyx pens. 1 each 2   ??? multivitamin (MULTIVITAMIN) per tablet Take 1 tablet by mouth. (Patient not taking: Reported on 11/22/2019)     ??? omega-3 acid ethyl esters (LOVAZA) 1 gram capsule Take 1,000 mg by mouth. (Patient not taking: Reported on 11/22/2019)       No current facility-administered medications on file prior to visit.        ALLERGIES:      Lisinopril, Latex, and Penicillins         PAST MEDICAL HISTORY:     Past Medical History:   Diagnosis Date   ??? Diabetes mellitus (CMS-HCC)    ??? Eczema    ??? High blood pressure        PAST SURGICAL HISTORY:   No past surgical history on file.    PROBLEM LIST:     Patient Active Problem List    Diagnosis Date Noted   ??? Ingrown toenail of right foot 11/23/2019   ??? Administrative encounter 02/10/2019   ??? Back pain 11/18/2018   ???  Encounter to establish care 11/17/2018   ??? Type 2 diabetes mellitus with hyperglycemia, without long-term current use of insulin (CMS-HCC) 02/04/2018   ??? BMI 60.0-69.9, adult (CMS-HCC) 10/06/2016   ??? Eczema 10/06/2016   ??? Psoriasis 10/06/2016   ??? Hypertension 09/30/2016   ??? Sleep apnea 09/30/2016           SOCIAL HISTORY:      reports that she has never smoked. She has never used smokeless tobacco. She reports previous alcohol use. She reports that she does not use drugs.   FAMILY HISTORY:     family history includes Breast cancer in her maternal aunt; Clotting disorder in her father; Diabetes in her father and sister; Hypertension in her father and mother; Kidney disease in her father.       Objective:    BP 122/79  - Pulse 105  - Wt (!) 158.3 kg (349 lb)  - BMI 62.78 kg/m??  - Body mass index is 62.78 kg/m??.    Physical exam:  General:  Well appearing, well nourished in no distress.   Skin: no obvious rash or prominent lesions  +onychomycosis, ingrown toenail right 1st toe no significant erythema or purulence  Lymphatic: no cervical LAD. No TM  Cardiovascular:  rrr no mrg, no peripheral edema  Eyes:  conjunctiva clear, sclera non-icteric   Ears/nose/throat: no obvious external deformities  Respiratory: CTAB, breathing comfortably  Musculoskeletal:  Normal gait and station.  Neurologic: moves extremities symmetrically, cranial nerves grossly intact  Hematologic: no obvious ecchymosis     Psychiatric:  Oriented X3, intact judgement and insight, normal mood and affect.             No results found for this or any previous visit (from the past 24 hour(s)).     I attest that I, Selinda Orion, personally documented this note while acting as Neurosurgeon for [employer], PAC.    Selinda Orion, Neurosurgeon.    November 21, 2019 2:36 PM      The documentation recorded by the scribe accurately reflects the service I personally performed and the decisions made by me.    Bilan Tedesco A Tadan Shill, PAC

## 2019-11-22 ENCOUNTER — Encounter: Admit: 2019-11-22 | Discharge: 2019-11-23 | Payer: PRIVATE HEALTH INSURANCE | Attending: Medical | Primary: Medical

## 2019-11-22 DIAGNOSIS — Z6841 Body Mass Index (BMI) 40.0 and over, adult: Principal | ICD-10-CM

## 2019-11-22 DIAGNOSIS — E1165 Type 2 diabetes mellitus with hyperglycemia: Principal | ICD-10-CM

## 2019-11-22 DIAGNOSIS — L600 Ingrowing nail: Principal | ICD-10-CM

## 2019-11-22 MED ORDER — OZEMPIC 0.25 MG OR 0.5 MG (2 MG/1.5 ML) SUBCUTANEOUS PEN INJECTOR
SUBCUTANEOUS | 0 refills | 127.00000 days | Status: CP
Start: 2019-11-22 — End: 2020-03-28

## 2019-11-22 MED ORDER — METFORMIN ER 500 MG TABLET,EXTENDED RELEASE 24 HR
ORAL_TABLET | Freq: Every day | ORAL | 3 refills | 90.00000 days | Status: CP
Start: 2019-11-22 — End: ?

## 2019-11-22 MED ORDER — SIMVASTATIN 20 MG TABLET
ORAL_TABLET | Freq: Every evening | ORAL | 3 refills | 90 days | Status: CP
Start: 2019-11-22 — End: ?

## 2019-11-23 DIAGNOSIS — L600 Ingrowing nail: Secondary | ICD-10-CM | POA: Insufficient documentation

## 2019-11-23 NOTE — Unmapped (Signed)
Today???s BP: 122/79. Current BMI: 62.78. restarting GLP-1. Will discuss topiramate in future    Wt Readings from Last 6 Encounters:   11/22/19 (!) 158.3 kg (349 lb)   08/04/19 (!) 157.4 kg (347 lb 0.6 oz)   06/21/19 (!) 156.5 kg (345 lb 1.3 oz)   02/10/19 (!) 158.3 kg (349 lb 0.6 oz)   11/18/18 (!) 163.8 kg (361 lb 1.3 oz)   11/04/18 (!) 164.7 kg (363 lb 1.3 oz)     ??  Goal: To lose 5-10 pounds in 3 months from the present weight: Weight: (!) 158.3 kg (349 lb)  Patient agrees to follow up with me closely for weight check/ lifestyle check.   Diet: Stop drinking soda, sweet tea, juice, and other sugary drinks (if any). Significantly decrease processed carbohydrates, desserts, or unhealthy snacks. Advised that dietician can help make appropriate manageable recommendations.  Referrals - we have discussed referral to weight management clinic, plan to optimize medical management in our clinic for now  Exercise: Advised to exercise at least 30 minutes per day (walking, treadmill, bicycling, swimming, gym). Advised Pt to start exercising 10 minutes 3 times per week and increase by 5 minutes every 2 weeks until exercising 30 minutes 3 times per week, then add 1 day.

## 2019-11-23 NOTE — Unmapped (Addendum)
Recurrent ingrown toenail - previously treated with course of oral lamisil and partial nail removal.   -Referral to [facility] podiatry for further eval definitive treatment

## 2019-11-23 NOTE — Unmapped (Addendum)
Disease status: has been well controlled, but recently off meds  Lab Results   Component Value Date    A1C 6.5 (A) 06/21/2019       Medication:  ?? Restart metformin 1500 mg daily as prescribed  ?? Restart Ozempic 0.25 mg q week x 4 weeks  THEN increase to 0.5 mg every day (for diabetes as well as weight loss).  No family history of medullary thyroid cancer, MEN or pancreatitis. Talk to pharmacist if Ozempic is >$25/mo with drug manufacturer's coupon.    No A1c or other labs today as patient has not been taking metformin or Ozempic.  ??  ADA diet. Exercise daily 30 minutes. Dental exam annually. Foot exam daily/moisturizer daily. Advised to follow up with his/her ophthalmologist annually.  ??  DM Check list:  Foot Exam: 11/18/18  Retinal Exam: 02/03/19, w/o retinopathy  Flu shot: 01/2019  Pneumovax: UTD, 11/18/18  Baseline EKG: none on file at [facility] [place] without acute ST changes noted in [facility] records 07/01/18  Microalbumin: on ARB    LDL:  Zocor 20 mg qd        Lab Results   Component Value Date   ?? LDL Calculated 75 06/21/2019    ASA: no per current guidelines  The 10-year ASCVD risk score Denman George DC Jr., et al., 2013) is: 15.1%

## 2019-12-09 NOTE — Unmapped (Signed)
[facility] Refill Coordination Note    Specialty Medication(s) to be Shipped:   Inflammatory Disorders: Cosentyx    Other medication(s) to be shipped: No additional medications requested for fill at this time     Tracy Robles, DOB: 07-03-1965  Phone: [phone] (home)       All above HIPAA information was verified with patient.     Was a Nurse, learning disability used for this call? No    Completed refill call assessment today to schedule patient's medication shipment from the [facility] [phone]).       Specialty medication(s) and dose(s) confirmed: Regimen is correct and unchanged.   Changes to medications: Tracy Robles reports no changes at this time.  Changes to insurance: No  Questions for the pharmacist: No    Confirmed patient received Welcome Packet with first shipment. The patient will receive a drug information handout for each medication shipped and additional FDA Medication Guides as required.       DISEASE/MEDICATION-SPECIFIC INFORMATION        For patients on injectable medications: Patient currently has 0 doses left.  Next injection is scheduled for 12/14/2019.    SPECIALTY MEDICATION ADHERENCE     Medication Adherence    Patient reported X missed doses in the last month: 0  Specialty Medication: Cosentyx 150 mg/ml  Patient is on additional specialty medications: No  Any gaps in refill history greater than 2 weeks in the last 3 months: no  Demonstrates understanding of importance of adherence: yes  Informant: patient  Reliability of informant: reliable  Confirmed plan for next specialty medication refill: delivery by pharmacy  Refills needed for supportive medications: not needed                      SHIPPING     Shipping address confirmed in Epic.     Delivery Scheduled: Yes, Expected medication delivery date: 12/12/2019.     Medication will be delivered via Same Day Courier to the prescription address in Epic WAM.    Tracy Robles D Sequoyah Ramone   [facility] Specialty Technician

## 2019-12-12 MED FILL — COSENTYX PEN 300 MG/2 PENS (150 MG/ML) SUBCUTANEOUS: SUBCUTANEOUS | 28 days supply | Qty: 2 | Fill #4

## 2019-12-12 MED FILL — COSENTYX PEN 300 MG/2 PENS (150 MG/ML) SUBCUTANEOUS: 28 days supply | Qty: 2 | Fill #4 | Status: AC

## 2019-12-13 ENCOUNTER — Encounter: Admit: 2019-12-13 | Discharge: 2019-12-14 | Payer: PRIVATE HEALTH INSURANCE

## 2019-12-13 DIAGNOSIS — L409 Psoriasis, unspecified: Principal | ICD-10-CM

## 2019-12-13 MED ORDER — TRIAMCINOLONE ACETONIDE 0.1 % TOPICAL OINTMENT
0 refills | 0 days | Status: CN
Start: 2019-12-13 — End: ?

## 2019-12-13 MED ORDER — CLOBETASOL 0.05 % SCALP SOLUTION
6 refills | 0 days | Status: CP
Start: 2019-12-13 — End: ?

## 2019-12-13 MED ORDER — TRIAMCINOLONE ACETONIDE 0.1 % TOPICAL CREAM
1 refills | 0 days | Status: CP
Start: 2019-12-13 — End: ?

## 2019-12-13 NOTE — Unmapped (Signed)
DERMATOLOGY CLINIC NOTE    ASSESSMENT AND PLAN:          Return to clinic:        CHIEF COMPLAINT:  ***     HPI:   This is a pleasant 54 y.o. female who last saw me on 11/15/2019.  She has a history with me of psoriasis but was seen at last visit for a new itchy rash.  This was in the context of significant stress with her husband passed away recently.  Per history, her condition at that time seem to be similar to urticaria and I recommended treatment with Allegra, Zyrtec, topical triamcinolone.  She is also on Cosentyx for psoriasis.     PAST MEDICAL HISTORY:  Diabetes  Psoriasis with consistent biopsy on 01/12/2018  Onychomycosis with clipping consistent from 11/09/2018     MEDICATIONS:   Reviewed in epic.    ALLERGIES:   Lisinopril, latex, penicillins    SOCIAL HISTORY:  She is a bus driver for town of [employer]  Husband passed away suddenly about a week ago    FAMILY HISTORY:  ***     REVIEW OF SYSTEMS:  Baseline state of health. No recent illnesses. No other skin complaints.     PHYSICAL EXAMINATION:  Examination in the presence of chaperone:  General: Well-developed, well-nourished. No acute distress.   Neuro: Alert and oriented, answers questions appropriately.  Skin: Examination of the scalp, face, head, neck, chest, back, upper extremities, lower extremities, hands, palms, soles was performed and notable for the following:  ***     Dictation software was used while making this note. Please excuse any errors made with dictation software. chaperone:  General: Well-developed, well-nourished. No acute distress.   Neuro: Alert and oriented, answers questions appropriately.  Skin: Examination of the back, scalp, exposed forearms, exposed legs and notable for the following:  Tiny psoriasiform plaque on back; skin otherwise clear     Dictation software was used while making this note. Please excuse any errors made with dictation software.

## 2019-12-13 NOTE — Unmapped (Signed)
Dear [name],    It was great to see you today.  And let us see that your itch has improved.  Please continue the Allegra and Zyrtec for 2 more weeks, after that try to discontinue the Allegra in the morning and continue only the Zyrtec.  I would recommend continuing the Zyrtec for another 4 weeks or so after that and then trying to discontinue Zyrtec as well.  If your itch flares in trying to discontinue those medications, you can always restart.    For rash between the skin folds you can use triamcinolone on occasion.  Also recommend over-the-counter Desitin to help to protect the skin.    We will plan on rechecking things in about 6 months.  Please let me know if things should flareup in the meantime.    Thank you,      Rory Percy, MD

## 2019-12-20 NOTE — Unmapped (Unsigned)
Assessment & Plan        I personally spent *** minutes face-to-face and non-face-to-face in the care of this patient, which includes all pre, intra, and post visit time on the date of service.            Problem List Items Addressed This Visit     None           No follow-ups on file.     Medication adherence and barriers to the treatment plan have been addressed. Opportunities to optimize healthy behaviors have been discussed. Patient / caregiver voiced understanding.      Subjective:     Tracy Robles is a 54 y.o. female who presents for No chief complaint on file.            History of Present Illness     patient last seen by me 11/22/19. DM2: restart metformin 1500 mg daily, Ozempic 0.25 mg qwk x4wks, then 0.5 mg qwk. Ingrown toenail: recurrent, previously treated with oral lamisil and partial nail removal. Referred to [facility] podiatry. BMI 62.78: restarting GLP-1, will discuss topiramate in future.     Seen by derm 12/13/19. Possible urticaria: continue Allegra and Zyrtec; discontinue Allegra after 2 wks, continue Zyrtec a few more. Psoriasis: excellent control, continue Cosentyx. Skin fold irritation: sounds like intertrigo, declines examination. Recommended empiric TMC cream, Sesitin.        PHQ-2 Score:      PHQ-9 Score:      Edinburgh Score:      {select_status_or_delete_smartlist:64641}    Hemoglobin A1C (%)   Date Value   06/21/2019 6.5 (A)   11/18/2018 6.9 (A)     LDL Calculated (mg/dL)   Date Value   16/01/9603 75     BP Readings from Last 3 Encounters:   11/22/19 122/79   08/04/19 158/91   06/21/19 143/86     Wt Readings from Last 3 Encounters:   11/22/19 (!) 158.3 kg (349 lb)   08/04/19 (!) 157.4 kg (347 lb 0.6 oz)   06/21/19 (!) 156.5 kg (345 lb 1.3 oz)           Review of Systems     History obtained from {source of history:310783} and chart review     ROS negative unless otherwise stated in the HPI:            Review of History     MEDICATIONS:        Current Outpatient Medications on File Prior to Visit   Medication Sig Dispense Refill   ??? amLODIPine (NORVASC) 10 MG tablet Take 1 tablet (10 mg total) by mouth daily. 90 tablet 3   ??? biotin 1 mg cap Take by mouth.     ??? cetirizine (ZYRTEC) 5 MG tablet Take 5 mg by mouth daily.     ??? clobetasoL (TEMOVATE) 0.05 % external solution Apply topically to scalp twice daily. 50 mL 6   ??? clobetasol (TEMOVATE) 0.05 % ointment Apply topically to rash on body twice daily. 60 g 6   ??? empty container Misc Use as directed to dispose of Cosentyx pens. 1 each 2   ??? ferrous sulfate 325 (65 FE) MG EC tablet Take by mouth.     ??? fexofenadine (ALLEGRA) 60 MG tablet Take 60 mg by mouth Two (2) times a day. Administer with water only; do not administer with fruit juices     ??? gabapentin (NEURONTIN) 300 MG capsule Take 2 capsules (600 mg total) by  mouth two (2) times a day. 360 capsule 3   ??? hydroCHLOROthiazide (HYDRODIURIL) 25 MG tablet Take 1 tablet (25 mg total) by mouth daily. 90 tablet 3   ??? losartan (COZAAR) 100 MG tablet Take 1 tablet (100 mg total) by mouth daily. 90 tablet 3   ??? metFORMIN (GLUCOPHAGE-XR) 500 MG 24 hr tablet Take 3 tablets (1,500 mg total) by mouth daily with evening meal. 270 tablet 3   ??? multivitamin (MULTIVITAMIN) per tablet Take 1 tablet by mouth.      ??? mupirocin (BACTROBAN) 2 % ointment APPLY OINTMENT TOPICALLY TO AFFECTED AREA TWICE DAILY FOR 7 DAYS     ??? omega-3 acid ethyl esters (LOVAZA) 1 gram capsule Take 1,000 mg by mouth.      ??? secukinumab (COSENTYX PEN, 2 PENS,) 150 mg/mL PnIj injection Inject the contents of 2 pens (300 mg total) under the skin every twenty-eight (28) days. 14 mL PRN   ??? semaglutide (OZEMPIC) 0.25 mg or 0.5 mg(2 mg/1.5 mL) PnIj injection Inject 0.25 mg under the skin every seven (7) days for 28 days, THEN 0.5 mg every seven (7) days. 6 mL 0   ??? simvastatin (ZOCOR) 20 MG tablet Take 1 tablet (20 mg total) by mouth nightly. 90 tablet 3   ??? triamcinolone (KENALOG) 0.1 % cream Apply topically twice daily as needed only. 454 g 1 ??? triamcinolone (KENALOG) 0.1 % ointment Frequency:PHARMDIR   Dosage:0.0     Instructions:  Note:apply to affected areas twice daily until smooth. Dose: 0.1% (Patient not taking: Reported on 12/13/2019)     ??? vitamin E 1000 UNIT capsule Take 1,000 Units by mouth.       No current facility-administered medications on file prior to visit.        ALLERGIES:      Lisinopril, Latex, and Penicillins         PAST MEDICAL HISTORY:     Past Medical History:   Diagnosis Date   ??? Diabetes mellitus (CMS-HCC)    ??? Eczema    ??? High blood pressure        PAST SURGICAL HISTORY:   No past surgical history on file.    PROBLEM LIST:     Patient Active Problem List    Diagnosis Date Noted   ??? Ingrown toenail of right foot 11/23/2019   ??? Administrative encounter 02/10/2019   ??? Back pain 11/18/2018   ??? Encounter to establish care 11/17/2018   ??? Type 2 diabetes mellitus with hyperglycemia, without long-term current use of insulin (CMS-HCC) 02/04/2018   ??? BMI 60.0-69.9, adult (CMS-HCC) 10/06/2016   ??? Eczema 10/06/2016   ??? Psoriasis 10/06/2016   ??? Hypertension 09/30/2016   ??? Sleep apnea 09/30/2016           SOCIAL HISTORY:      reports that she has never smoked. She has never used smokeless tobacco. She reports previous alcohol use. She reports that she does not use drugs.   FAMILY HISTORY:     family history includes Breast cancer in her maternal aunt; Clotting disorder in her father; Diabetes in her father and sister; Hypertension in her father and mother; Kidney disease in her father.       Objective:    There were no vitals taken for this visit. - There is no height or weight on file to calculate BMI.    Physical exam:  General:  Well appearing, well nourished in no distress.   Skin: no obvious rash or  prominent lesions    Lymphatic: no cervical LAD. No TM  Cardiovascular:  rrr no mrg, no peripheral edema  Eyes:  conjunctiva clear, sclera non-icteric   Ears/nose/throat: no obvious external deformities  Respiratory: CTAB, breathing comfortably  Gastrointestinal: soft NTND no masses  Musculoskeletal:  Normal gait and station.  Neurologic: moves extremities symmetrically, cranial nerves grossly intact  Hematologic: no obvious ecchymosis     Psychiatric:  Oriented X3, intact judgement and insight, normal mood and affect.             No results found for this or any previous visit (from the past 24 hour(s)).     I attest that I, Selinda Orion, personally documented this note while acting as Neurosurgeon for [employer], PAC.    Selinda Orion, Neurosurgeon.    December 20, 2019 8:28 AM      The documentation recorded by the scribe accurately reflects the service I personally performed and the decisions made by me.    Hillary A Tester, PAC

## 2019-12-29 DIAGNOSIS — E1165 Type 2 diabetes mellitus with hyperglycemia: Principal | ICD-10-CM

## 2019-12-29 NOTE — Unmapped (Signed)
Patient is requesting orders for an A1C to be done 9/17. 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     If you agree please place order for A1C , please advise patient once order is placed.    Thanks.

## 2019-12-30 ENCOUNTER — Other Ambulatory Visit: Admit: 2019-12-30 | Discharge: 2019-12-31 | Payer: PRIVATE HEALTH INSURANCE

## 2019-12-30 DIAGNOSIS — E1165 Type 2 diabetes mellitus with hyperglycemia: Principal | ICD-10-CM

## 2019-12-30 NOTE — Unmapped (Signed)
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

## 2020-01-02 ENCOUNTER — Encounter: Payer: Self-pay | Admitting: Podiatry

## 2020-01-02 ENCOUNTER — Encounter: Payer: Self-pay | Admitting: *Deleted

## 2020-01-02 ENCOUNTER — Ambulatory Visit: Payer: BC Managed Care – PPO | Admitting: Podiatry

## 2020-01-02 ENCOUNTER — Other Ambulatory Visit: Payer: Self-pay

## 2020-01-02 DIAGNOSIS — M79675 Pain in left toe(s): Secondary | ICD-10-CM

## 2020-01-02 DIAGNOSIS — L600 Ingrowing nail: Secondary | ICD-10-CM | POA: Diagnosis not present

## 2020-01-03 ENCOUNTER — Encounter: Payer: Self-pay | Admitting: Podiatry

## 2020-01-03 ENCOUNTER — Ambulatory Visit: Payer: Self-pay | Admitting: Podiatry

## 2020-01-03 NOTE — Progress Notes (Signed)
Subjective:  Patient ID: Julia Snow, female    DOB: 04/12/1966,  MRN: [id number]  Chief Complaint  Patient presents with  . Nail Problem    Patient presents today with ingrown toenail left hallux    54 y.o. female presents with the above complaint.  Patient presents with left medial ingrown nail border.  She states that it continues to hurt.  It hurts when ambulating.  She saw Dr. Graciela Husbands in the past to cut the ingrown partially but not completely.  Patient said that there is still some worsening pain she denies any other acute complaints.  Has been going for 3 to 4 weeks.   Review of Systems: Negative except as noted in the HPI. Denies N/V/F/Ch.  Past Medical History:  Diagnosis Date  . Allergy   . Hypertension   . Miscarriage 2005  . Sleep apnea    does not wear CPAP    Current Outpatient Medications:  .  amLODipine (NORVASC) 10 MG tablet, Take 1 tablet (10 mg total) by mouth daily., Disp: 90 tablet, Rfl: 1 .  Biotin 32202 MCG TABS, Take by mouth., Disp: , Rfl:  .  cetirizine (ZYRTEC) 5 MG tablet, Take by mouth., Disp: , Rfl:  .  clobetasol (TEMOVATE) 0.05 % external solution, Apply topically to scalp twice daily., Disp: , Rfl:  .  ferrous sulfate 325 (65 FE) MG EC tablet, Take 325 mg by mouth 3 (three) times daily with meals., Disp: , Rfl:  .  fexofenadine (ALLEGRA) 60 MG tablet, Take by mouth., Disp: , Rfl:  .  gabapentin (NEURONTIN) 300 MG capsule, Take 2 capsules (600 mg total) by mouth daily as needed (shingles pain)., Disp: 30 capsule, Rfl: 1 .  hydrochlorothiazide (HYDRODIURIL) 25 MG tablet, Take 1 tablet (25 mg total) by mouth daily., Disp: 90 tablet, Rfl: 1 .  losartan (COZAAR) 100 MG tablet, Take 1 tablet (100 mg total) by mouth daily., Disp: 90 tablet, Rfl: 1 .  metFORMIN (GLUCOPHAGE-XR) 500 MG 24 hr tablet, Take 500 mg by mouth 3 (three) times daily., Disp: , Rfl:  .  Multiple Vitamin (MULTIVITAMIN) tablet, Take 1 tablet by mouth daily., Disp: , Rfl:  .  mupirocin  ointment (BACTROBAN) 2 %, APPLY OINTMENT TOPICALLY TO AFFECTED AREA TWICE DAILY FOR 7 DAYS, Disp: , Rfl:  .  Secukinumab 150 MG/ML SOAJ, INJECT THE CONTENTS OF 2 PENS (300 MG) ONCE WEEKLY AT WEEKS 0, 1, 2, 3, AND 4. THEN INJECT 2 PENS (300 MG) EVERY 4 WEEKS, Disp: , Rfl:  .  Semaglutide,0.25 or 0.5MG /DOS, (OZEMPIC, 0.25 OR 0.5 MG/DOSE,) 2 MG/1.5ML SOPN, , Disp: , Rfl:  .  simvastatin (ZOCOR) 20 MG tablet, Take 1 tablet (20 mg total) by mouth at bedtime., Disp: 90 tablet, Rfl: 1 .  triamcinolone cream (KENALOG) 0.1 %, Apply topically 2 (two) times daily as needed., Disp: , Rfl:  .  triamcinolone cream (KENALOG) 0.5 %, Apply 1 application topically 3 (three) times daily., Disp: 454 g, Rfl: 5 .  vitamin E 1000 UNIT capsule, Take 1,000 Units by mouth daily., Disp: , Rfl:   Social History   Tobacco Use  Smoking Status Never Smoker  Smokeless Tobacco Never Used    Allergies  Allergen Reactions  . Lisinopril Cough  . Penicillins   . Latex Rash    Other reaction(s): UNKNOWN Other reaction(s): UNKNOWN Other reaction(s): UNKNOWN    Objective:  There were no vitals filed for this visit. There is no height or weight on file to calculate  BMI. Constitutional Well developed. Well nourished.  Vascular Dorsalis pedis pulses palpable bilaterally. Posterior tibial pulses palpable bilaterally. Capillary refill normal to all digits.  No cyanosis or clubbing noted. Pedal hair growth normal.  Neurologic Normal speech. Oriented to person, place, and time. Epicritic sensation to light touch grossly present bilaterally.  Dermatologic Painful ingrowing nail at medial nail borders of the hallux nail left. No other open wounds. No skin lesions.  Orthopedic: Normal joint ROM without pain or crepitus bilaterally. No visible deformities. No bony tenderness.   Radiographs: None Assessment:   1. Ingrown left big toenail   2. Great toe pain, left    Plan:  Patient was evaluated and treated and all  questions answered.  Ingrown Nail, left -Patient elects to proceed with minor surgery to remove ingrown toenail removal today. Consent reviewed and signed by patient. -Ingrown nail excised. See procedure note. -Educated on post-procedure care including soaking. Written instructions provided and reviewed. -Patient to follow up in 2 weeks for nail check.  Procedure: Excision of Ingrown Toenail Location: Left 1st toe medial nail borders. Anesthesia: Lidocaine 1% plain; 1.5 mL and Marcaine 0.5% plain; 1.5 mL, digital block. Skin Prep: Betadine. Dressing: Silvadene; telfa; dry, sterile, compression dressing. Technique: Following skin prep, the toe was exsanguinated and a tourniquet was secured at the base of the toe. The affected nail border was freed, split with a nail splitter, and excised. Chemical matrixectomy was then performed with phenol and irrigated out with alcohol. The tourniquet was then removed and sterile dressing applied. Disposition: Patient tolerated procedure well. Patient to return in 2 weeks for follow-up.   No follow-ups on file.

## 2020-01-04 NOTE — Unmapped (Signed)
[facility] Refill Coordination Note    Specialty Medication(s) to be Shipped:   Inflammatory Disorders: Cosentyx    Other medication(s) to be shipped: No additional medications requested for fill at this time     Tracy Robles, DOB: 1966/03/04  Phone: [phone] (home)       All above HIPAA information was verified with patient.     Was a Nurse, learning disability used for this call? No    Completed refill call assessment today to schedule patient's medication shipment from the [facility] [phone]).       Specialty medication(s) and dose(s) confirmed: Regimen is correct and unchanged.   Changes to medications: [facility]ria reports no changes at this time.  Changes to insurance: No  Questions for the pharmacist: No    Confirmed patient received Welcome Packet with first shipment. The patient will receive a drug information handout for each medication shipped and additional FDA Medication Guides as required.       DISEASE/MEDICATION-SPECIFIC INFORMATION        For patients on injectable medications: Patient currently has 0 doses left.  Next injection is scheduled for 01/13/20.    SPECIALTY MEDICATION ADHERENCE     Medication Adherence    Patient reported X missed doses in the last month: 0  Specialty Medication: Cosentyx 150 mg/ml  Patient is on additional specialty medications: No                COsentyx: 0 days [place] of medication on hand.        SHIPPING     Shipping address confirmed in Epic.     Delivery Scheduled: Yes, Expected medication delivery date: 01/10/20.     Medication will be delivered via Same Day Courier to the prescription address in Epic WAM.    [place] A Lurae Hornbrook   [facility] Specialty Technician

## 2020-01-05 MED FILL — COSENTYX PEN 300 MG/2 PENS (150 MG/ML) SUBCUTANEOUS: 28 days supply | Qty: 2 | Fill #5 | Status: AC

## 2020-01-05 MED FILL — COSENTYX PEN 300 MG/2 PENS (150 MG/ML) SUBCUTANEOUS: SUBCUTANEOUS | 28 days supply | Qty: 2 | Fill #5

## 2020-01-23 ENCOUNTER — Encounter: Admit: 2020-01-23 | Discharge: 2020-01-24 | Payer: PRIVATE HEALTH INSURANCE | Attending: Medical | Primary: Medical

## 2020-01-23 DIAGNOSIS — Z6841 Body Mass Index (BMI) 40.0 and over, adult: Principal | ICD-10-CM

## 2020-01-23 DIAGNOSIS — Z1211 Encounter for screening for malignant neoplasm of colon: Principal | ICD-10-CM

## 2020-01-23 DIAGNOSIS — E1165 Type 2 diabetes mellitus with hyperglycemia: Principal | ICD-10-CM

## 2020-01-23 DIAGNOSIS — I10 Essential (primary) hypertension: Principal | ICD-10-CM

## 2020-01-23 LAB — CO2: Carbon dioxide:SCnc:Pt:Ser/Plas:Qn:: 27.1

## 2020-01-23 LAB — COMPREHENSIVE METABOLIC PANEL
ALBUMIN: 3.6 g/dL (ref 3.4–5.0)
ALKALINE PHOSPHATASE: 69 U/L (ref 46–116)
ALT (SGPT): 17 U/L (ref 10–49)
ANION GAP: 6 mmol/L (ref 5–14)
AST (SGOT): 17 U/L (ref <=34)
BLOOD UREA NITROGEN: 15 mg/dL (ref 9–23)
BUN / CREAT RATIO: 16
CALCIUM: 9.1 mg/dL (ref 8.7–10.4)
CHLORIDE: 106 mmol/L (ref 98–107)
CO2: 27.1 mmol/L (ref 20.0–31.0)
CREATININE: 0.91 mg/dL
EGFR CKD-EPI AA FEMALE: 83 mL/min/{1.73_m2} (ref >=60)
EGFR CKD-EPI NON-AA FEMALE: 72 mL/min/{1.73_m2} (ref >=60)
GLUCOSE RANDOM: 109 mg/dL (ref 70–179)
POTASSIUM: 4.2 mmol/L (ref 3.4–4.5)
PROTEIN TOTAL: 7.1 g/dL (ref 5.7–8.2)
SODIUM: 139 mmol/L (ref 135–145)

## 2020-01-23 LAB — ALBUMIN/CREATININE RATIO: Albumin/Creatinine:MRto:Pt:Urine:Qn:: 0

## 2020-01-23 MED ORDER — WEGOVY 1.7 MG/0.75 ML SUBCUTANEOUS PEN INJECTOR
SUBCUTANEOUS | 0 refills | 0.00000 days | Status: CP
Start: 2020-01-23 — End: ?

## 2020-01-23 MED ORDER — WEGOVY 1 MG/0.5 ML SUBCUTANEOUS PEN INJECTOR
SUBCUTANEOUS | 0 refills | 0.00000 days | Status: CP
Start: 2020-01-23 — End: ?

## 2020-01-23 NOTE — Unmapped (Addendum)
Increase Ozempic to 0.5 mg once a week THEN after 4 weeks START Wegovy 1 mg once a week x 4 weeks THEN increase Wegovy to 1.7 mg once a week/    Patient Education        semaglutide (oral/injection)  Pronunciation:  SEM a GLOO tide  Brand:  Ozempic, Rybelsus  What is the most important information I should know about semaglutide?  You should not use semaglutide if you have multiple endocrine neoplasia type 2 (tumors in your glands), a personal or family history of medullary thyroid cancer, insulin-dependent diabetes, or diabetic ketoacidosis.  Call your doctor at once if you have signs of a thyroid tumor, such as swelling or a lump in your neck, trouble swallowing, a hoarse voice, or shortness of breath.  What is semaglutide?  Semaglutide is used with diet and exercise to improve blood sugar control in adults with type 2 diabetes mellitus (not for type 1 diabetes).  Semaglutide may also be used for purposes not listed in this medication guide.  What should I discuss with my healthcare provider before using semaglutide?  You should not use semaglutide if you are allergic to it, or if you have:  ?? multiple endocrine neoplasia type 2 (tumors in your glands);  ?? a personal or family history of medullary thyroid carcinoma (a type of thyroid cancer); or  ?? diabetic ketoacidosis (call your doctor for treatment).  Tell your doctor if you have ever had:  ?? a stomach or intestinal disorder;  ?? pancreatitis;  ?? kidney disease; or  ?? eye problems caused by diabetes (retinopathy).  In animal studies, semaglutide caused thyroid tumors or thyroid cancer.  It is not known whether these effects would occur in people using regular doses. Ask your doctor about your risk.  Semaglutide may harm an unborn baby. Tell your doctor if you are pregnant or plan to become pregnant. Semaglutide can have long-lasting effects on your body. Avoid getting pregnant for at least 2 months after you stop using this medicine.  You should not breastfeed while using semaglutide.  Semaglutide is not approved for use by anyone younger than 53 years old.  How should I use semaglutide?  Follow all directions on your prescription label and read all medication guides or instruction sheets. Your doctor may occasionally change your dose. Use the medicine exactly as directed.  Rybelsus (oral) is taken by mouth when you first wake up, at least 30 minutes before you eat or drink anything. Take this medicine with no more than 4 ounces of water. Swallow the tablet whole and do not crush, chew, or break it.  Eat within 30 to 60 minutes after taking Rybelsus.  Ozempic (injection) is injected under the skin.  Read and follow all Instructions for Use. Ask your doctor or pharmacist if you need help.  Prepare an injection only when you are ready to give it. Call your pharmacist if the medicine looks cloudy, has changed colors, or has particles in it.  Ozempic is usually given once per week at any time of the day, with or without a meal. If you want to change your weekly injection day, wait at least 2 days after your most recent injection before giving another one.  Your healthcare provider will show you where to inject Ozempic. Do not inject into the same place two times in a row.  Blood sugar can be affected by stress, illness, surgery, exercise, alcohol use, or skipping meals.  Low blood sugar (hypoglycemia) can make you  feel very hungry, dizzy, irritable, or shaky. To quickly treat hypoglycemia, eat or drink hard candy, crackers, raisins, fruit juice, or non-diet soda. Your doctor may prescribe glucagon injection in case of severe hypoglycemia.  Tell your doctor if you have frequent symptoms of high blood sugar (hyperglycemia) such as increased thirst or urination. Ask your doctor before changing your medication dosage.  Call your doctor if you have ongoing vomiting or diarrhea, or if you are sweating more than usual. You can easily become dehydrated while taking semaglutide. This can lead to kidney failure.  Your treatment may also include diet, exercise, weight control, and special medical care.  Store Rybelsus tablets in their original package at room temperature, away from moisture and heat.  Storing unopened Ozempic injection pens:  Store in the refrigerator. Do not freeze semaglutide, and throw away the medication if it has become frozen. Do not use an unopened injection pen if the expiration date on the label has passed.  Storing after your first use: You may keep an in-use injection pen in the refrigerator or at room temperature. Protect the pen from heat and sunlight. Remove the needle before storing an injection pen, and keep the cap on the pen when not in use. Throw the injection pen away 56 days after the first use.  Do not reuse a needle or injection pen. Place them in a puncture-proof sharps container and dispose of it following state or local laws. Keep out of the reach of children and pets.  What happens if I miss a dose?  For Rybelsus: Skip the missed dose and use your next dose at the regular time. Do not use two doses at one time.  For Ozempic: Use the medicine as soon as you can, but skip the missed dose if you are more than 5 days late for the dose. Do not use two doses within 5 days of each other.  What happens if I overdose?  Seek emergency medical attention or call the Poison Help line at [phone].  What should I avoid while using semaglutide?  Never share an injection pen, even if you changed the needle. Sharing this device can pass infection or disease from person to person.  What are the possible side effects of semaglutide?  Get emergency medical help if you have signs of an allergic reaction:  hives, itching; dizziness, fast heartbeats; difficult breathing; swelling of your face, lips, tongue, or throat.  Call your doctor at once if you have:  ?? vision changes;  ?? signs of a thyroid tumor --swelling or a lump in your neck, trouble swallowing, a hoarse voice, feeling short of breath;  ?? symptoms of pancreatitis --severe pain in your upper stomach spreading to your back, nausea with or without vomiting, fast heart rate;  ?? low blood sugar --headache, hunger, weakness, sweating, confusion, irritability, dizziness, fast heart rate, or feeling jittery; or  ?? kidney problems --little or no urination, swelling in your feet or ankles, feeling tired or short of breath.  Common side effects may include:  ?? nausea, vomiting, stomach pain, loss of appetite;  ?? diarrhea; or  ?? constipation.  This is not a complete list of side effects and others may occur. Call your doctor for medical advice about side effects. You may report side effects to FDA at 1-800-FDA-1088.  What other drugs will affect semaglutide?  Semaglutide can slow your digestion, and it may take longer for your body to absorb any medicines you take by mouth.  Tell your  doctor about all your other medicines, especially insulin or other diabetes medicine.  Other drugs may affect semaglutide, including prescription and over-the-counter medicines, vitamins, and herbal products. Tell your doctor about all your current medicines and any medicine you start or stop using.  Where can I get more information?  Your pharmacist can provide more information about semaglutide.  Remember, keep this and all other medicines out of the reach of children, never share your medicines with others, and use this medication only for the indication prescribed.   Every effort has been made to ensure that the information provided by [employer] ('[employer]') is accurate, up-to-date, and complete, but no guarantee is made to that effect. Drug information contained herein may be time sensitive. [employer] information has been compiled for use by healthcare practitioners and consumers in the [place] and therefore [employer] does not warrant that uses outside of the [place] are appropriate, unless specifically indicated otherwise. [employer]'s drug information does not endorse drugs, diagnose patients or recommend therapy. [employer]'s drug information is an Investment banker, corporate to assist licensed healthcare practitioners in caring for their patients and/or to serve consumers viewing this service as a supplement to, and not a substitute for, the expertise, skill, knowledge and judgment of healthcare practitioners. The absence of a warning for a given drug or drug combination in no way should be construed to indicate that the drug or drug combination is safe, effective or appropriate for any given patient. Mult[employer] does not assume any responsibility for any aspect of healthcare administered with the aid of information [employer] provides. The information contained herein is not intended to cover all possible uses, directions, precautions, warnings, drug interactions, allergic reactions, or adverse effects. If you have questions about the drugs you are taking, check with your doctor, nurse or pharmacist.  Copyright [phone] [employer] Version: 3.01. Revision date: 08/22/2019.  Care instructions adapted under license by [facility]. If you have questions about a medical condition or this instruction, always ask your healthcare professional. [employer] disclaims any warranty or liability for your use of this information.

## 2020-01-23 NOTE — Unmapped (Signed)
FLUCELVAX was    administered in Injection site: Left deltoid, pt tolerated well, vaccine information given, advised to contact office if any adverse reaction. Verified by teammates: Valene Bors, CMA

## 2020-01-23 NOTE — Unmapped (Signed)
BP not at goal -Does not recheck at home/patient agrees to get blood pressure cuff to monitor at home.  We discussed goal blood pressure of less than 130/80  ??? Continue losartan 100 mg qd, amlodipine 10 mg qd and HCTZ 25 mg qd as prescribed  Encouraged healthy, low-salt diet and regular exercise. Recommended pt check BP at home or local pharmacy w/ goal BP less than 130/80.     BP Readings from Last 3 Encounters:   01/23/20 146/83   11/22/19 122/79   08/04/19 158/91     Lab Results   Component Value Date    K 4.9 06/21/2019    NA 139 06/21/2019    CREATININE 0.97 06/21/2019

## 2020-01-23 NOTE — Unmapped (Addendum)
Disease status: has been well controlled, but recently off meds  Lab Results   Component Value Date    A1C 6.7 (A) 12/30/2019       Medication:  ?? Continue metformin 1500 mg daily as prescribed  ?? Start Ozempic 0.5 mg every week x 4 weeks THEN transition to [facility] 1 mg qweek     ADA diet. Exercise daily 30 minutes. Dental exam annually. Foot exam daily/moisturizer daily. Advised to follow up with his/her ophthalmologist annually.  ??  DM Check list:  Foot Exam: 01/23/20\  Retinal Exam: 02/03/19, w/o retinopathy, encouraged to scheduled updated eye exam today  Flu shot: 01/23/20  Pneumovax: UTD, 11/18/18  Baseline EKG: none on file at [facility], [place] without acute ST changes noted in [facility] records 07/01/18  Microalbumin: on ARB    LDL:  Zocor 20 mg qd  Lab Results   Component Value Date    LDL 75 06/21/2019     The 16-XWRU ASCVD risk score Denman George DC Jr., et al., 2013) is: 15.1%

## 2020-01-23 NOTE — Unmapped (Signed)
Name:  Tracy Robles  DOB: 1965-07-13  Today's Date:   Age:  54 y.o.    A/P:  Problem List Items Addressed This Visit        Endocrine    Type 2 diabetes mellitus with hyperglycemia, without long-term current use of insulin (CMS-HCC) - Primary     Disease status: has been well controlled, but recently off meds  Lab Results   Component Value Date    A1C 6.7 (A) 12/30/2019       Medication:  ?? Continue metformin 1500 mg daily as prescribed  ?? Start Ozempic 0.5 mg every week x 4 weeks THEN transition to [facility] 1 mg qweek     ADA diet. Exercise daily 30 minutes. Dental exam annually. Foot exam daily/moisturizer daily. Advised to follow up with his/her ophthalmologist annually.  ??  DM Check list:  Foot Exam: 01/23/20\  Retinal Exam: 02/03/19, w/o retinopathy, encouraged to scheduled updated eye exam today  Flu shot: 01/23/20  Pneumovax: UTD, 11/18/18  Baseline EKG: none on file at [facility], [place] without acute ST changes noted in [facility] records 07/01/18  Microalbumin: on ARB    LDL:  Zocor 20 mg qd  Lab Results   Component Value Date    LDL 75 06/21/2019     The 16-XWRU ASCVD risk score Denman George DC Jr., et al., 2013) is: 15.1%           Relevant Orders    INFLUENZA INJ MDCK PF, QUAD (FLUCELVAX)(2Y AND UP EGG FREE) (Completed)    HM DIABETES FOOT EXAM (Completed)    Albumin/creatinine urine ratio (Completed)       Cardiovascular and Mediastinum    Hypertension     BP not at goal -Does not recheck at home/patient agrees to get blood pressure cuff to monitor at home.  We discussed goal blood pressure of less than 130/80  ??? Continue losartan 100 mg qd, amlodipine 10 mg qd and HCTZ 25 mg qd as prescribed  Encouraged healthy, low-salt diet and regular exercise. Recommended pt check BP at home or local pharmacy w/ goal BP less than 130/80.     BP Readings from Last 3 Encounters:   01/23/20 146/83   11/22/19 122/79   08/04/19 158/91     Lab Results   Component Value Date    K 4.9 06/21/2019    NA 139 06/21/2019    CREATININE 0.97 06/21/2019 Other    BMI 60.0-69.9, adult (CMS-HCC)     Transition from Ozempic to [facility] to see if this helps more with weight loss goals.            Other Visit Diagnoses     Screen for colon cancer        Relevant Orders    Colorectal Cancer DNA + FIT          Medication adherence and barriers to the treatment plan have been addressed. Opportunities to optimize healthy behaviors have been discussed. Patient / caregiver voiced understanding.      Return in about 6 weeks (around 03/05/2020) for Recheck.    S:  Tracy Robles is a 54 y.o. female who presents for chronic disease management    1. DM2  Managed with metformin 1500 mg every day  Ozempic, but last OV had run out and was advised to restart  Due for eye exam  Denies low BS or shakiness  Not checking BS regularly  Lab Results   Component Value Date    A1C  6.7 (A) 12/30/2019          2. Obesity  Started on Ozempic last OV  Hx of kidney stones or glaucoma - no  Wt Readings from Last 6 Encounters:   01/23/20 (!) 158.8 kg (350 lb 0.6 oz)   11/22/19 (!) 158.3 kg (349 lb)   08/04/19 (!) 157.4 kg (347 lb 0.6 oz)   06/21/19 (!) 156.5 kg (345 lb 1.3 oz)   02/10/19 (!) 158.3 kg (349 lb 0.6 oz)   11/18/18 (!) 163.8 kg (361 lb 1.3 oz)         3. HTN  Not checking BP at home  BP Readings from Last 3 Encounters:   01/23/20 146/83   11/22/19 122/79   08/04/19 158/91     Denies CP, SOB, L/D or swelling in extremities  Managed with  losartan 100 mg qd, amlodipine 10 mg qd and HCTZ 25 mg qd           ROS:  A 12 point review of systems was negative except for pertinent items noted in the HPI.    Past medical history, family history, surgical history and social history personally reviewed and verified with patient.     PROBLEM LIST  Patient Active Problem List   Diagnosis   ??? BMI 60.0-69.9, adult (CMS-HCC)   ??? Eczema   ??? Hypertension   ??? Psoriasis   ??? Sleep apnea   ??? Type 2 diabetes mellitus with hyperglycemia, without long-term current use of insulin (CMS-HCC)   ??? Encounter to establish care   ??? Back pain   ??? Administrative encounter   ??? Ingrown toenail of right foot       O:  Vitals:    01/23/20 0907   BP: 146/83   Pulse: 80   Temp: 36.5 ??C (97.7 ??F)   SpO2: 98%     General:  Well appearing, well nourished in no distress.   Skin: no obvious rash or  prominent lesions    Neck: Supple, no obvious JVD  Cardiovascular:  rrr no mrg, no peripheral edema  Eyes:  conjunctiva clear, sclera non-icteric   Ears/nose/throat: no obvious external deformities  Respiratory: CTAB, breathing comfortably  Musculoskeletal:  Normal gait and station.  Neurologic: moves extremities symmetrically, cranial nerves grossly intact  Hematologic: no obvious ecchymosis     Psychiatric:  Oriented X3, intact judgement and insight, normal mood and affect    Tiernan Suto A Romello Hoehn, PAC

## 2020-01-23 NOTE — Unmapped (Unsigned)
Home BP- not checking at home

## 2020-01-25 NOTE — Unmapped (Signed)
Transition from Ozempic to [facility] to see if this helps more with weight loss goals.

## 2020-02-01 NOTE — Unmapped (Signed)
[facility] Clinical Assessment & Refill Coordination Note    Tracy Robles, [place]: 04-12-1966  Phone: [phone] (home)     All above HIPAA information was verified with patient.     Was a Nurse, learning disability used for this call? No    Specialty Medication(s):   Inflammatory Disorders: Cosentyx     Current Outpatient Medications   Medication Sig Dispense Refill   ??? amLODIPine (NORVASC) 10 MG tablet Take 1 tablet (10 mg total) by mouth daily. 90 tablet 3   ??? biotin 1 mg cap Take by mouth.     ??? cetirizine (ZYRTEC) 5 MG tablet Take 5 mg by mouth daily.     ??? clobetasoL (TEMOVATE) 0.05 % external solution Apply topically to scalp twice daily. 50 mL 6   ??? clobetasol (TEMOVATE) 0.05 % ointment Apply topically to rash on body twice daily. 60 g 6   ??? empty container Misc Use as directed to dispose of Cosentyx pens. 1 each 2   ??? ferrous sulfate 325 (65 FE) MG EC tablet Take by mouth.     ??? fexofenadine (ALLEGRA) 60 MG tablet Take 60 mg by mouth Two (2) times a day. Administer with water only; do not administer with fruit juices     ??? gabapentin (NEURONTIN) 300 MG capsule Take 2 capsules (600 mg total) by mouth two (2) times a day. 360 capsule 3   ??? hydroCHLOROthiazide (HYDRODIURIL) 25 MG tablet Take 1 tablet (25 mg total) by mouth daily. 90 tablet 3   ??? losartan (COZAAR) 100 MG tablet Take 1 tablet (100 mg total) by mouth daily. 90 tablet 3   ??? metFORMIN (GLUCOPHAGE-XR) 500 MG 24 hr tablet Take 3 tablets (1,500 mg total) by mouth daily with evening meal. 270 tablet 3   ??? multivitamin (MULTIVITAMIN) per tablet Take 1 tablet by mouth.  (Patient not taking: Reported on 01/23/2020)     ??? mupirocin (BACTROBAN) 2 % ointment APPLY OINTMENT TOPICALLY TO AFFECTED AREA TWICE DAILY FOR 7 DAYS     ??? omega-3 acid ethyl esters (LOVAZA) 1 gram capsule Take 1,000 mg by mouth.  (Patient not taking: Reported on 01/23/2020)     ??? secukinumab (COSENTYX PEN, 2 PENS,) 150 mg/mL PnIj injection Inject the contents of 2 pens (300 mg total) under the skin every twenty-eight (28) days. 14 mL PRN   ??? simvastatin (ZOCOR) 20 MG tablet Take 1 tablet (20 mg total) by mouth nightly. 90 tablet 3   ??? triamcinolone (KENALOG) 0.1 % cream Apply topically twice daily as needed only. 454 g 1   ??? triamcinolone (KENALOG) 0.1 % ointment Frequency:PHARMDIR   Dosage:0.0     Instructions:  Note:apply to affected areas twice daily until smooth. Dose: 0.1%     ??? vitamin E 1000 UNIT capsule Take 1,000 Units by mouth.     ??? WEGOVY 1 MG/0.5 ML SUBCUTANEOUS PEN INJECTOR Inject 1 mg under the skin every seven (7) days. 4 each 0   ??? WEGOVY 1.7 MG/0.75 ML SUBCUTANEOUS PEN INJECTOR Inject 1.7 mg under the skin every seven (7) days. 4 each 0     No current facility-administered medications for this visit.        Changes to medications: Tracy Robles reports no changes at this time.    Allergies   Allergen Reactions   ??? Lisinopril Cough   ??? Latex      Other reaction(s): UNKNOWN   ??? Penicillins      Other reaction(s): UNSPECIFIED  Changes to allergies: No    SPECIALTY MEDICATION ADHERENCE     Cosentyx 150 mg/ml: 10 days of medicine on hand       Medication Adherence    Patient reported X missed doses in the last month: 0  Specialty Medication: Cosentyx 150 mg/ml  Patient is on additional specialty medications: No  Informant: patient  Reasons for non-adherence: no problems identified  Confirmed plan for next specialty medication refill: delivery by pharmacy  Refills needed for supportive medications: not needed          Specialty medication(s) dose(s) confirmed: Regimen is correct and unchanged.     Are there any concerns with adherence? No    Adherence counseling provided? Not needed    CLINICAL MANAGEMENT AND INTERVENTION      Clinical Benefit Assessment:    Do you feel the medicine is effective or helping your condition? Yes    Clinical Benefit counseling provided? Progress note from 12/13/19 shows evidence of clinical benefit    Adverse Effects Assessment:    Are you experiencing any side effects? No    Are you experiencing difficulty administering your medicine? No    Quality of Life Assessment:    How many days over the past month did your psoriasis  keep you from your normal activities? For example, brushing your teeth or getting up in the morning. 0    Have you discussed this with your provider? Not needed    Therapy Appropriateness:    Is therapy appropriate? Yes, therapy is appropriate and should be continued    DISEASE/MEDICATION-SPECIFIC INFORMATION      For patients on injectable medications: Patient currently has 0 doses left.  Next injection is scheduled for 02/12/20.    PATIENT SPECIFIC NEEDS     - Does the patient have any physical, cognitive, or cultural barriers? No    - Is the patient high risk? No    - Does the patient require a Care Management Plan? No     - Does the patient require physician intervention or other additional services (i.e. nutrition, smoking cessation, social work)? No      SHIPPING     Specialty Medication(s) to be Shipped:   Inflammatory Disorders: Cosentyx    Other medication(s) to be shipped: No additional medications requested for fill at this time     Changes to insurance: No    Delivery Scheduled: Yes, Expected medication delivery date: 02/07/20.     Medication will be delivered via Same Day Courier to the confirmed prescription address in [facility].    The patient will receive a drug information handout for each medication shipped and additional FDA Medication Guides as required.  Verified that patient has previously received a Conservation officer, historic buildings.    All of the patient's questions and concerns have been addressed.    Tracy Robles Vangie Bicker   [facility] Specialty Pharmacist

## 2020-02-03 NOTE — Unmapped (Signed)
Hillary, PA was denied.

## 2020-02-04 NOTE — Unmapped (Signed)
New PA submitted

## 2020-02-07 MED FILL — COSENTYX PEN 300 MG/2 PENS (150 MG/ML) SUBCUTANEOUS: SUBCUTANEOUS | 28 days supply | Qty: 2 | Fill #6

## 2020-02-07 MED FILL — COSENTYX PEN 300 MG/2 PENS (150 MG/ML) SUBCUTANEOUS: 28 days supply | Qty: 2 | Fill #6 | Status: AC

## 2020-02-08 LAB — COLOGUARD: COLOGUARD: NEGATIVE

## 2020-02-08 LAB — EXTERNAL GENERIC LAB PROCEDURE: COLOGUARD: NEGATIVE

## 2020-02-13 ENCOUNTER — Encounter: Payer: Self-pay | Admitting: Podiatry

## 2020-02-13 ENCOUNTER — Ambulatory Visit: Payer: BC Managed Care – PPO | Admitting: Podiatry

## 2020-02-13 ENCOUNTER — Other Ambulatory Visit: Payer: Self-pay

## 2020-02-13 DIAGNOSIS — Z9889 Other specified postprocedural states: Secondary | ICD-10-CM | POA: Diagnosis not present

## 2020-02-13 DIAGNOSIS — L600 Ingrowing nail: Secondary | ICD-10-CM | POA: Diagnosis not present

## 2020-02-13 DIAGNOSIS — E1165 Type 2 diabetes mellitus with hyperglycemia: Secondary | ICD-10-CM

## 2020-02-13 NOTE — Progress Notes (Signed)
  Subjective:  Patient ID: Julia Snow, female    DOB: 09/25/1965,  MRN: [id number]  Chief Complaint  Patient presents with  . Nail Problem    "the nail that the doctor cut out is still hurting me on the side of the nail"    54 y.o. female presents with the above complaint. History confirmed with patient.  Previously had a partial nail avulsion on 01/02/2020 on the left medial hallux nail border with Dr. Allena Katz.  Objective:  Physical Exam: warm, good capillary refill, no trophic changes or ulcerative lesions, normal DP and PT pulses and normal sensory exam. Left Foot: Hallux nail avulsion site is healing well, some scab interposed between the nail and the nail fold  Assessment:  No diagnosis found.   Plan:  Patient was evaluated and treated and all questions answered.  -The callus in the nail fold was debrided with a curette -Reassured her that this will likely self-limiting and should continue to improve.  Continue moisturizing this area to keep it soft and smooth -Demonstrated taping of the nail fold to alleviate pressure  She asked about diabetic shoes.  She currently is classified as prediabetes.  I discussed with her that insurance would not cover this for prediabetic patients.  She is also aware that she should check her feet daily, wear wide shoes with a walker roomy toe box, has palpable pulses and no history of ulceration.  She will continue to monitor her feet daily and I think this is reasonable without diabetic shoes.  No follow-ups on file.

## 2020-02-22 DIAGNOSIS — M549 Dorsalgia, unspecified: Principal | ICD-10-CM

## 2020-02-22 MED ORDER — GABAPENTIN 300 MG CAPSULE
ORAL_CAPSULE | 0 refills | 0 days
Start: 2020-02-22 — End: ?

## 2020-02-23 MED ORDER — GABAPENTIN 300 MG CAPSULE
ORAL_CAPSULE | 0 refills | 0 days
Start: 2020-02-23 — End: ?

## 2020-03-01 NOTE — Unmapped (Signed)
[facility] Refill Coordination Note    Specialty Medication(s) to be Shipped:   Inflammatory Disorders: Cosentyx    Other medication(s) to be shipped: No additional medications requested for fill at this time     Falan Hensler, DOB: 1965-11-15  Phone: [phone] (home)       All above HIPAA information was verified with patient.     Was a Nurse, learning disability used for this call? No    Completed refill call assessment today to schedule patient's medication shipment from the [facility] [phone]).       Specialty medication(s) and dose(s) confirmed: Regimen is correct and unchanged.   Changes to medications: Naquisha reports no changes at this time.  Changes to insurance: No  Questions for the pharmacist: No    Confirmed patient received Welcome Packet with first shipment. The patient will receive a drug information handout for each medication shipped and additional FDA Medication Guides as required.       DISEASE/MEDICATION-SPECIFIC INFORMATION        For patients on injectable medications: Patient currently has 0 doses left.  Next injection is scheduled for takes on the 1st of every month.    SPECIALTY MEDICATION ADHERENCE     Medication Adherence    Patient reported X missed doses in the last month: 0  Specialty Medication: Cosentyx 150 mg/ml   Patient is on additional specialty medications: No  Any gaps in refill history greater than 2 weeks in the last 3 months: no  Demonstrates understanding of importance of adherence: yes  Informant: patient  Reliability of informant: reliable  Confirmed plan for next specialty medication refill: delivery by pharmacy  Refills needed for supportive medications: not needed                COsentyx: 0 days [place] of medication on hand.        SHIPPING     Shipping address confirmed in Epic.     Delivery Scheduled: Yes, Expected medication delivery date: 03/05/2020.     Medication will be delivered via Same Day Courier to the prescription address in Epic WAM.    Rosha Cocker D Lua Feng   [facility] Specialty Technician

## 2020-03-05 ENCOUNTER — Encounter: Admit: 2020-03-05 | Discharge: 2020-03-06 | Payer: PRIVATE HEALTH INSURANCE | Attending: Medical | Primary: Medical

## 2020-03-05 DIAGNOSIS — Z6841 Body Mass Index (BMI) 40.0 and over, adult: Principal | ICD-10-CM

## 2020-03-05 DIAGNOSIS — E1165 Type 2 diabetes mellitus with hyperglycemia: Principal | ICD-10-CM

## 2020-03-05 DIAGNOSIS — I10 Essential (primary) hypertension: Principal | ICD-10-CM

## 2020-03-05 DIAGNOSIS — G4730 Sleep apnea, unspecified: Principal | ICD-10-CM

## 2020-03-05 MED ORDER — LOSARTAN 100 MG-HYDROCHLOROTHIAZIDE 25 MG TABLET
ORAL_TABLET | Freq: Every day | ORAL | 11 refills | 30 days | Status: CP
Start: 2020-03-05 — End: 2021-03-05

## 2020-03-05 MED FILL — COSENTYX PEN 300 MG/2 PENS (150 MG/ML) SUBCUTANEOUS: SUBCUTANEOUS | 28 days supply | Qty: 2 | Fill #7

## 2020-03-05 MED FILL — COSENTYX PEN 300 MG/2 PENS (150 MG/ML) SUBCUTANEOUS: 28 days supply | Qty: 2 | Fill #7 | Status: AC

## 2020-03-05 NOTE — Unmapped (Signed)
Congratulated patient on 10 pound weight loss.  So far tolerating we go be well. Patient to titrate to 1 mg weekly then 1.7 mg weekly if tolerated.  Recheck in 6 weeks.

## 2020-03-05 NOTE — Unmapped (Signed)
BP not at goal.  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  Discussed various options patient wishes to try combo losartan/HCTZ 100/25.  If unable to tolerate this discussed a lower dose more potent diuretic like chlorthalidone in place of HCTZ.  Recheck in 6 weeks or sooner if needed. Get BMP next visit.

## 2020-03-05 NOTE — Unmapped (Signed)
Name:  Tracy Robles  DOB: [birth year]  Today's Date: 03/05/2020  Age:  54 y.o.    A/P:  Problem List Items Addressed This Visit        Endocrine    Type 2 diabetes mellitus with hyperglycemia, without long-term current use of insulin (CMS-HCC)     Patient recently had eye exam.  Outside records from [facility] in [place] requested.         Relevant Orders    Health Maintenance Outside Records Request       Respiratory    Sleep apnea     Reviewed health consequences of untreated OSA.  Patient unable to tolerate CPAP in past.  Discussed trying one more time by working with sleep specialist.  If patient unable to tolerate CPAP, agreed to oral device         Relevant Orders    Ambulatory referral to Sleep Clinic       Cardiovascular and Mediastinum    Primary hypertension     BP not at goal.  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  Discussed various options patient wishes to try combo losartan/HCTZ 100/25.  If unable to tolerate this discussed a lower dose more potent diuretic like chlorthalidone in place of HCTZ.  Recheck in 6 weeks or sooner if needed. Get BMP next visit.            Other    BMI 60.0-69.9, adult (CMS-HCC) - Primary     Congratulated patient on 10 pound weight loss.  So far tolerating we go be well. Patient to titrate to 1 mg weekly then 1.7 mg weekly if tolerated.  Recheck in 6 weeks.               Medication adherence and barriers to the treatment plan have been addressed. Opportunities to optimize healthy behaviors have been discussed. Patient / caregiver voiced understanding.      Return in about 6 weeks (around 04/16/2020) for Recheck- A1C, BMP, BP recheck.    S:  Tracy Robles is a 54 y.o. 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 Currently taking 0.5 mg daily.   Wt Readings from Last 6 Encounters:   03/05/20 (!) 154.2 kg (340 lb)   01/23/20 (!) 158.8 kg (350 lb 0.6 oz)   11/22/19 (!) 158.3 kg (349 lb)   08/04/19 (!) 157.4 kg (347 lb 0.6 oz)   06/21/19 (!) 156.5 kg (345 lb 1.3 oz)   02/10/19 (!) 158.3 kg (349 lb 0.6 oz)     Down to 1 soda per day and the rest water             ROS:  A 12 point review of systems was negative except for pertinent items noted in the HPI.    Past medical history, family history, surgical history and social history personally reviewed and verified with patient.     PROBLEM LIST  Patient Active Problem List   Diagnosis   ??? BMI 60.0-69.9, adult (CMS-HCC)   ??? Eczema   ??? Primary  hypertension   ??? Psoriasis   ??? Sleep apnea   ??? Type 2 diabetes mellitus with hyperglycemia, without long-term current use of insulin (CMS-HCC)   ??? Encounter to establish care   ??? Back pain   ??? Administrative encounter   ??? Ingrown toenail of right foot       O:  Vitals:    03/05/20 0919   BP: 157/96   Pulse: 84   Temp: 36.4 ??C (97.6 ??F)   SpO2: 97%     General:  Well appearing, well nourished in no distress.   Skin: no obvious rash or  prominent lesions    Neck: Supple, no obvious JVD  Cardiovascular:  rrr no mrg, no peripheral edema  Eyes:  conjunctiva clear, sclera non-icteric   Ears/nose/throat: no obvious external deformities  Respiratory: CTAB, breathing comfortably  Musculoskeletal:  Normal gait and station.  Neurologic: moves extremities symmetrically, cranial nerves grossly intact  Hematologic: no obvious ecchymosis     Psychiatric:  Oriented X3, intact judgement and insight, normal mood and affect.    Kolton Kienle A Westin Knotts, PAC

## 2020-03-05 NOTE — Unmapped (Signed)
Reviewed health consequences of untreated OSA.  Patient unable to tolerate CPAP in past.  Discussed trying one more time by working with sleep specialist.  If patient unable to tolerate CPAP, agreed to oral device

## 2020-03-05 NOTE — Unmapped (Signed)
Patient recently had eye exam.  Outside records from [facility] in [place] requested.

## 2020-03-05 NOTE — Unmapped (Signed)
Goal BP is less than 130/80    For heartburn- recommend Prilosec (omeprazole) 20 mg once a day for 2 weeks to see if this helpfs

## 2020-03-08 NOTE — Unmapped (Signed)
patient called wanting a new pt appt with elle on a monday please advise

## 2020-03-12 ENCOUNTER — Encounter: Payer: Self-pay | Admitting: Podiatry

## 2020-03-12 ENCOUNTER — Other Ambulatory Visit: Payer: Self-pay

## 2020-03-12 ENCOUNTER — Ambulatory Visit: Payer: BC Managed Care – PPO | Admitting: Podiatry

## 2020-03-12 DIAGNOSIS — M79675 Pain in left toe(s): Secondary | ICD-10-CM | POA: Diagnosis not present

## 2020-03-12 DIAGNOSIS — M79674 Pain in right toe(s): Secondary | ICD-10-CM

## 2020-03-12 DIAGNOSIS — L600 Ingrowing nail: Secondary | ICD-10-CM | POA: Diagnosis not present

## 2020-03-12 DIAGNOSIS — L608 Other nail disorders: Secondary | ICD-10-CM

## 2020-03-12 DIAGNOSIS — B351 Tinea unguium: Secondary | ICD-10-CM | POA: Diagnosis not present

## 2020-03-12 MED ORDER — CICLOPIROX 8 % EX SOLN: Freq: Every day | CUTANEOUS | 2 refills | Status: DC

## 2020-03-12 NOTE — Progress Notes (Signed)
  Subjective:  Patient ID: Julia Snow, female    DOB: Dec 21, 1965,  MRN: [id number]  Chief Complaint  Patient presents with  . Nail Problem    Patient presents today for ingrown toenails bilat hallux lateral borders at corner tip of nails  She says left is more sore than right    54 y.o. female returns with the above complaint. History confirmed with patient.  Previously had a partial nail avulsion on 01/02/2020 on the left medial hallux nail border with Dr. Allena Katz.  Now the lateral borders are ingrowing into the tips.  She would not like to have them avulsed or matricectomy today under anesthesia.  She just wants the corners out.  The bilateral fourth toes and right fifth toes also bother her.  Objective:  Physical Exam: warm, good capillary refill, no trophic changes or ulcerative lesions, normal DP and PT pulses and normal sensory exam. Bilateral hallux lateral borders have incurvation without paronychia, no proximal pain.  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 Assessment:   1. Ingrown left big toenail   2. Ingrowing right great toenail   3. Onychomycosis   4. Pain due to onychomycosis of toenails of both feet      Plan:  Patient was evaluated and treated and all questions answered.  -Recommend she apply urea cream daily to the nail folds to soften them  -The incurvated borders were debrided in a slant back fashion to her tolerance.  Discussed with her that if this continues to recur or becomes infected we should proceed with matricectomy with partial nail avulsion  -The pincer nail deformities and onychomycosis was sharply debrided with a sharp nail nipper to tolerance for the bilateral fourth toes as well as the right fifth toe.  I recommend we treat with topical treatment with Penlac.  Prescription sent to  pharmacy.  No follow-ups on file.

## 2020-03-12 NOTE — Patient Instructions (Addendum)
Look for urea 40% cream or ointment and apply to the thickened dry skin / calluses. Apply it daily to the corner of the toenails that are uncomfortable. This can be bought over the counter, at a pharmacy or online such as [employer].        Ankle Sprain, Phase I Rehab An ankle sprain is an injury to the ligaments of your ankle. Ankle sprains cause stiffness, loss of motion, and loss of strength. Ask your health care provider which exercises are safe for you. Do exercises exactly as told by your health care provider and adjust them as directed. It is normal to feel mild stretching, pulling, tightness, or discomfort as you do these exercises. Stop right away if you feel sudden pain or your pain gets worse. Do not begin these exercises until told by your health care provider. Stretching and range-of-motion exercises These exercises warm up your muscles and joints and improve the movement and flexibility of your lower leg and ankle. These exercises also help to relieve pain and stiffness. Gastroc and soleus stretch  This exercise is also called a calf stretch. It stretches the muscles in the back of the lower leg. These muscles are the gastrocnemius, or gastroc, and the soleus. 1. Sit on the floor with your left / right leg extended. 2. Loop a belt or towel around the ball of your left / right foot. The ball of your foot is on the walking surface, right under your toes. 3. Keep your left / right ankle and foot relaxed and keep your knee straight while you use the belt or towel to pull your foot toward you. You should feel a gentle stretch behind your calf or knee in your gastroc muscle. 4. Hold this position for 15 seconds, then release to the starting position. 5. Repeat the exercise with your knee bent. You can put a pillow or a rolled bath towel under your knee to support it. You should feel a stretch deep in your calf in the soleus muscle or at your Achilles tendon. Repeat 5 times. Complete this  exercise 2 times a day. Ankle alphabet   1. Sit with your left / right leg supported at the lower leg. ? Do not rest your foot on anything. ? Make sure your foot has room to move freely. 2. Think of your left / right foot as a paintbrush. ? Move your foot to trace each letter of the alphabet in the air. Keep your hip and knee still while you trace. ? Make the letters as large as you can without feeling discomfort. 3. Trace every letter from A to Z. Repeat 5 times. Complete this exercise 2 times a day. Strengthening exercises These exercises build strength and endurance in your ankle and lower leg. Endurance is the ability to use your muscles for a long time, even after they get tired. Ankle dorsiflexion   1. Secure a rubber exercise band or tube to an object, such as a table leg, that will stay still when the band is pulled. Secure the other end around your left / right foot. 2. Sit on the floor facing the object, with your left / right leg extended. The band or tube should be slightly tense when your foot is relaxed. 3. Slowly bring your foot toward you, bringing the top of your foot toward your shin (dorsiflexion), and pulling the band tighter. 4. Hold this position for 15 seconds. 5. Slowly return your foot to the starting position. Repeat 5 times.  Complete this exercise 2 times a day. Ankle plantar flexion   1. Sit on the floor with your left / right leg extended. 2. Loop a rubber exercise tube or band around the ball of your left / right foot. The ball of your foot is on the walking surface, right under your toes. ? Hold the ends of the band or tube in your hands. ? The band or tube should be slightly tense when your foot is relaxed. 3. Slowly point your foot and toes downward to tilt the top of your foot away from your shin (plantar flexion). 4. Hold this position for 15 seconds. 5. Slowly return your foot to the starting position. Repeat 5 times. Complete this exercise 2 times  a day. Ankle eversion 1. Sit on the floor with your legs straight out in front of you. 2. Loop a rubber exercise band or tube around the ball of your left / right foot. The ball of your foot is on the walking surface, right under your toes. 1. Hold the ends of the band in your hands, or secure the band to a stable object. 2. The band or tube should be slightly tense when your foot is relaxed. 3. Slowly push your foot outward, away from your other leg (eversion). 4. Hold this position for 15 seconds. 5. Slowly return your foot to the starting position. Repeat 5 times. Complete this exercise 2 times a day.

## 2020-03-14 NOTE — Unmapped (Signed)
Patient called stating that they are out of the medication [facility], please send to the [facility] on [address] in [place], please advise

## 2020-03-14 NOTE — Unmapped (Signed)
Patient is requesting the following refill  Requested Prescriptions     Pending Prescriptions Disp Refills   ??? WEGOVY 1.7 MG/0.75 ML SUBCUTANEOUS PEN INJECTOR 4 each 0     Sig: Inject 1.7 mg under the skin every seven (7) days.       Last OV: 03/05/2020    Next OV: Visit date not found.     Labs:   A1c:   Hemoglobin A1C (%)   Date Value   12/30/2019 6.7 (A)   , Creatinine:   Creatinine (mg/dL)   Date Value   95/28/4132 0.91   , Potassium:   Potassium (mmol/L)   Date Value   01/23/2020 4.2    and Sodium:   Sodium (mmol/L)   Date Value   01/23/2020 139

## 2020-03-15 MED ORDER — WEGOVY 1.7 MG/0.75 ML SUBCUTANEOUS PEN INJECTOR
SUBCUTANEOUS | 0 refills | 0 days | Status: CP
Start: 2020-03-15 — End: 2020-05-09

## 2020-03-18 NOTE — Unmapped (Signed)
This encounter was created in error - please disregard.

## 2020-03-26 ENCOUNTER — Encounter: Admit: 2020-03-26 | Discharge: 2020-03-26 | Payer: PRIVATE HEALTH INSURANCE

## 2020-03-26 DIAGNOSIS — Z79899 Other long term (current) drug therapy: Principal | ICD-10-CM

## 2020-03-26 DIAGNOSIS — L409 Psoriasis, unspecified: Principal | ICD-10-CM

## 2020-03-26 MED ORDER — COSENTYX PEN 300 MG/2 PENS (150 MG/ML) SUBCUTANEOUS
SUBCUTANEOUS | 11 refills | 56.00000 days | Status: CP
Start: 2020-03-26 — End: ?
  Filled 2020-04-04: qty 2, 28d supply, fill #0

## 2020-03-26 NOTE — Unmapped (Addendum)
For psoriasis:  - Continue Cosentyx as directed  - Continue to apply triamcinolone cream to active, raised areas on your elbows twice daily until clear, then stop. Reach out if you need refills.  - Please have your labs drawn today.    For irritation on your legs:  - I recommend switching from the triamcinolone steroid cream to Desitin cream only to prevent skin thinning.

## 2020-03-26 NOTE — Unmapped (Signed)
Dermatology Note     Assessment and Plan:      Psoriasis, excellent control- <1% BSA involvement  - Given great control, will proceed as follows:  - Continue secukinumab (COSENTYX PEN, 2 PENS,) 150 mg/mL PnIj injection; Inject the contents of 2 pens (300 mg total) under the skin every twenty-eight (28) days.  - Continue triamcinolone (KENALOG) 0.1% cream. Apply topically to active, raised areas twice daily as needed only. We reviewed proper use and side effects of topical steroids including striae and cutaneous atrophy.    High risk medication use (Cosentyx)  - Pended the following labs today  - Quantiferon TB Gold Plus; Future  ??  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     RTC: Return in about 1 year (around 03/26/2021) for Follow up of psoriasis. or sooner as needed   _________________________________________________________________      Chief Complaint     Chief Complaint   Patient presents with   ??? Skin Check     FBSE. no new areas of concern. pt c/o itching on inner thighs-pt states itching is getting somewhat better.      HPI     Tracy Robles. Tracy Robles is a 54 y.o. female who presents as a returning patient (last seen by Dr. Linton Rump on 12/13/2019) to [facility] for follow up of psoriasis. At last visit, the patient was to continue Cosentyx injections for psoriasis. She was also empirically started on triamcinolone cream for skin fold irritation at the time.    Today she reports great control of her psoriasis while continuing to treat with Cosentyx and triamcinolone cream. Patient notes flares on her elbows and splitting of her fingernails, but is content with overall progress.    Patient notes little improvement of the irritation on her legs while treating with triamcinolone and Desitin.    Additionally, she states that she intermittently has to use antihistamines for hive like eruptions, but this is now under control.    The patient denies any other new or changing lesions or areas of concern.     Pertinent Past Medical History     No history of skin cancer  Diabetes  Psoriasis with consistent biopsy on 01/12/2018  Onychomycosis with clipping consistent from 11/09/2018    Family History:   Negative for melanoma     Social History:  She is a bus Hospital doctor for town of [place]  Husband passed away suddenly in 12/11/2019    Past Medical History, Family History, Social History, Medication List, Allergies, and Problem List were reviewed in the rooming section of Epic.     ROS: Other than symptoms mentioned in the HPI, no fevers, chills, or other skin complaints    Physical Examination     GENERAL: Well-appearing female in no acute distress, resting comfortably.  NEURO: Alert and oriented, answers questions appropriately  PSYCH: Normal mood and affect  SKIN (Focal Skin Exam): Per patient request, examination of scalp, back, chest, bilateral upper and lower extremities was performed  - scattered scale throughout scalp  - few flesh colored to mildly erythematous papules on lower legs and low back (<1% BSA involvement)  - telangiectasias  on medial thighs    All areas not commented on are within normal limits or unremarkable    Scribe's Attestation: Newt Lukes, MD obtained and performed the history, physical exam and medical decision making elements that were entered into the chart. Signed by Mable Fill, Scribe, on March 26, 2020 at 1:02 PM.    ----------------------------------------------------------------------------------------------------------------------  March 26, 2020 8:07 PM. Documentation assistance provided by the Scribe. I was present during the time the encounter was recorded. The information recorded by the Scribe was done at my direction and has been reviewed and validated by me.    Jacobo Forest, MD  ----------------------------------------------------------------------------------------------------------------------       (Approved Template 12/26/2019)

## 2020-03-28 NOTE — Unmapped (Signed)
[facility] Refill Coordination Note    Specialty Medication(s) to be Shipped:   Inflammatory Disorders: Cosentyx    Other medication(s) to be shipped: No additional medications requested for fill at this time     Erabella Kuipers, DOB: Nov 03, 1965  Phone: [phone] (home)       All above HIPAA information was verified with patient.     Was a Nurse, learning disability used for this call? No    Completed refill call assessment today to schedule patient's medication shipment from the [facility] [phone]).       Specialty medication(s) and dose(s) confirmed: Regimen is correct and unchanged.   Changes to medications: Tahjanae reports no changes at this time.  Changes to insurance: No  Questions for the pharmacist: No    Confirmed patient received Welcome Packet with first shipment. The patient will receive a drug information handout for each medication shipped and additional FDA Medication Guides as required.       DISEASE/MEDICATION-SPECIFIC INFORMATION        For patients on injectable medications: Patient currently has 0 doses left.  Next injection is scheduled for 04/14/20.    SPECIALTY MEDICATION ADHERENCE     Medication Adherence    Patient reported X missed doses in the last month: 0  Specialty Medication: Cosentyx 150 mg/ml   Patient is on additional specialty medications: No  Informant: patient  Confirmed plan for next specialty medication refill: delivery by pharmacy  Refills needed for supportive medications: not needed          Refill Coordination    Has the Patients' Contact Information Changed: No  Is the Shipping Address Different: No           COSENTYX 150 mg/ml: 16 days of medicine on hand         SHIPPING     Shipping address confirmed in Epic.     Delivery Scheduled: Yes, Expected medication delivery date: 12/22.     Medication will be delivered via Same Day Courier to the prescription address in Epic WAM.    Jolene Schimke   [facility] Specialty Technician

## 2020-03-30 LAB — QUANTIFERON TB GOLD PLUS
QUANTIFERON ANTIGEN 1 MINUS NIL: 0.01 [IU]/mL
QUANTIFERON ANTIGEN 2 MINUS NIL: 0.01 [IU]/mL
QUANTIFERON MITOGEN: 9.99 [IU]/mL
QUANTIFERON TB GOLD PLUS: NEGATIVE
QUANTIFERON TB NIL VALUE: 0.01 [IU]/mL

## 2020-03-30 LAB — TB AG1: TB AG1 VALUE: 0.02

## 2020-03-30 LAB — TB MITOGEN: TB MITOGEN VALUE: 10

## 2020-03-30 LAB — TB NIL: TB NIL VALUE: 0.01

## 2020-03-30 LAB — TB AG2: TB AG2 VALUE: 0.02

## 2020-04-02 NOTE — Unmapped (Signed)
Negative quantiferon gold. Continue Cosentyx as discussed at her visit.

## 2020-04-04 MED FILL — COSENTYX PEN 300 MG/2 PENS (150 MG/ML) SUBCUTANEOUS: 28 days supply | Qty: 2 | Fill #0 | Status: AC

## 2020-04-23 ENCOUNTER — Ambulatory Visit: Payer: BC Managed Care – PPO | Admitting: Podiatry

## 2020-04-26 ENCOUNTER — Ambulatory Visit: Payer: BC Managed Care – PPO | Admitting: Podiatry

## 2020-04-26 ENCOUNTER — Encounter: Payer: Self-pay | Admitting: Podiatry

## 2020-04-26 ENCOUNTER — Other Ambulatory Visit: Payer: Self-pay

## 2020-04-26 DIAGNOSIS — L600 Ingrowing nail: Secondary | ICD-10-CM

## 2020-04-26 NOTE — Progress Notes (Signed)
Subjective:  Patient ID: Julia Snow, female    DOB: [birth year],  MRN: [id number]  Chief Complaint  Patient presents with  . Nail Problem    Bilateral 1st toenails medial borders painful, right 1st "I see some yellow drainage".     55 y.o. female presents with the above complaint.  Patient presents with complaint medial to the right hallux.  Patient said there is some drainage associated with it.  She states that the ingrown has been painful.  She states the other side has been doing fine.  She does not want to have the procedure done.  She wants to know if she will be just cut down a little bit.  She is not able to take any time off of work.  She denies any other acute complaints.   Review of Systems: Negative except as noted in the HPI. Denies N/V/F/Ch.  Past Medical History:  Diagnosis Date  . Allergy   . Hypertension   . Miscarriage 2005  . Sleep apnea    does not wear CPAP    Current Outpatient Medications:  .  amLODipine (NORVASC) 10 MG tablet, Take 1 tablet (10 mg total) by mouth daily., Disp: 90 tablet, Rfl: 1 .  Biotin 68341 MCG TABS, Take by mouth., Disp: , Rfl:  .  cetirizine (ZYRTEC) 5 MG tablet, Take by mouth., Disp: , Rfl:  .  ciclopirox (PENLAC) 8 % solution, Apply topically at bedtime. Apply over nail and surrounding skin. Apply daily over previous coat. After seven (7) days, may remove with alcohol and continue cycle., Disp: 6.6 mL, Rfl: 2 .  clobetasol (TEMOVATE) 0.05 % external solution, Apply topically to scalp twice daily., Disp: , Rfl:  .  ferrous sulfate 325 (65 FE) MG EC tablet, Take 325 mg by mouth 3 (three) times daily with meals., Disp: , Rfl:  .  fexofenadine (ALLEGRA) 60 MG tablet, Take by mouth., Disp: , Rfl:  .  gabapentin (NEURONTIN) 300 MG capsule, Take 2 capsules (600 mg total) by mouth daily as needed (shingles pain)., Disp: 30 capsule, Rfl: 1 .  hydrochlorothiazide (HYDRODIURIL) 25 MG tablet, Take 1 tablet (25 mg total) by mouth daily., Disp: 90  tablet, Rfl: 1 .  losartan (COZAAR) 100 MG tablet, Take 1 tablet (100 mg total) by mouth daily., Disp: 90 tablet, Rfl: 1 .  losartan-hydrochlorothiazide (HYZAAR) 100-25 MG tablet, Take 1 tablet by mouth daily., Disp: , Rfl:  .  metFORMIN (GLUCOPHAGE-XR) 500 MG 24 hr tablet, Take 500 mg by mouth 3 (three) times daily., Disp: , Rfl:  .  Multiple Vitamin (MULTIVITAMIN) tablet, Take 1 tablet by mouth daily., Disp: , Rfl:  .  mupirocin ointment (BACTROBAN) 2 %, APPLY OINTMENT TOPICALLY TO AFFECTED AREA TWICE DAILY FOR 7 DAYS, Disp: , Rfl:  .  Secukinumab 150 MG/ML SOAJ, INJECT THE CONTENTS OF 2 PENS (300 MG) ONCE WEEKLY AT WEEKS 0, 1, 2, 3, AND 4. THEN INJECT 2 PENS (300 MG) EVERY 4 WEEKS, Disp: , Rfl:  .  Semaglutide,0.25 or 0.5MG /DOS, (OZEMPIC, 0.25 OR 0.5 MG/DOSE,) 2 MG/1.5ML SOPN, , Disp: , Rfl:  .  Semaglutide-Weight Management (WEGOVY) 1 MG/0.5ML SOAJ, Inject into the skin., Disp: , Rfl:  .  simvastatin (ZOCOR) 20 MG tablet, Take 1 tablet (20 mg total) by mouth at bedtime., Disp: 90 tablet, Rfl: 1 .  triamcinolone cream (KENALOG) 0.1 %, Apply topically 2 (two) times daily as needed., Disp: , Rfl:  .  triamcinolone cream (KENALOG) 0.5 %, Apply 1 application topically  3 (three) times daily., Disp: 454 g, Rfl: 5 .  vitamin E 1000 UNIT capsule, Take 1,000 Units by mouth daily., Disp: , Rfl:   Social History   Tobacco Use  Smoking Status Never Smoker  Smokeless Tobacco Never Used    Allergies  Allergen Reactions  . Lisinopril Cough  . Penicillins   . Latex Rash    Other reaction(s): UNKNOWN Other reaction(s): UNKNOWN Other reaction(s): UNKNOWN    Objective:  There were no vitals filed for this visit. There is no height or weight on file to calculate BMI. Constitutional Well developed. Well nourished.  Vascular Dorsalis pedis pulses palpable bilaterally. Posterior tibial pulses palpable bilaterally. Capillary refill normal to all digits.  No cyanosis or clubbing noted. Pedal hair  growth normal.  Neurologic Normal speech. Oriented to person, place, and time. Epicritic sensation to light touch grossly present bilaterally.  Dermatologic Painful ingrowing nail at medial nail borders of the hallux nail right. No other open wounds. No skin lesions.  Orthopedic: Normal joint ROM without pain or crepitus bilaterally. No visible deformities. No bony tenderness.   Radiographs: None Assessment:   1. Ingrowing right great toenail    Plan:  Patient was evaluated and treated and all questions answered.  Ingrown Nail, right -I discussed with the patient extensive detail about the etiology of ingrown and various treatment options were discussed.  I ultimately discussed with her that she will benefit from doing a proper ingrown nail procedure to have it removed followed by performing matricectomy however patient refused to do that and would like to just attempt to do a slant back procedure to give her temporary relief.  She will figure out with her work schedule how much time off she can get afterwards as she states that she needs some time off after she has ingrown nail removed.  She will call back the office to schedule an appointment to have the ingrown removed.  No follow-ups on file.

## 2020-04-27 NOTE — Unmapped (Signed)
[facility] Refill Coordination Note    Specialty Medication(s) to be Shipped:   Inflammatory Disorders: Cosentyx    Other medication(s) to be shipped: No additional medications requested for fill at this time     Zarinah Oviatt, DOB: [birth year]  Phone: [phone] (home)       All above HIPAA information was verified with patient.     Was a Nurse, learning disability used for this call? No    Completed refill call assessment today to schedule patient's medication shipment from the [facility] [phone]).       Specialty medication(s) and dose(s) confirmed: Regimen is correct and unchanged.   Changes to medications: Lashawnda reports no changes at this time.  Changes to insurance: No  Questions for the pharmacist: No    Confirmed patient received Welcome Packet with first shipment. The patient will receive a drug information handout for each medication shipped and additional FDA Medication Guides as required.       DISEASE/MEDICATION-SPECIFIC INFORMATION        For patients on injectable medications: Patient currently has 0 doses left.  Next injection is scheduled for 01/29.    SPECIALTY MEDICATION ADHERENCE     Medication Adherence    Patient reported X missed doses in the last month: 0  Specialty Medication: Cosentyx 150 mg/ml  Patient is on additional specialty medications: No  Patient is on more than two specialty medications: No  Any gaps in refill history greater than 2 weeks in the last 3 months: no  Demonstrates understanding of importance of adherence: yes  Informant: patient  Reliability of informant: reliable  Provider-estimated medication adherence level: good  Patient is at risk for Non-Adherence: No                cosentyx 150 mg/ml: 0 days of medicine on hand         SHIPPING     Shipping address confirmed in Epic.     Delivery Scheduled: Yes, Expected medication delivery date: 01/20.     Medication will be delivered via Same Day Courier to the prescription address in Epic WAM.    Antonietta Barcelona   [facility] Specialty Technician

## 2020-05-03 MED FILL — COSENTYX PEN 300 MG/2 PENS (150 MG/ML) SUBCUTANEOUS: SUBCUTANEOUS | 28 days supply | Qty: 2 | Fill #1

## 2020-05-08 MED ORDER — WEGOVY 1.7 MG/0.75 ML SUBCUTANEOUS PEN INJECTOR
0 refills | 0.00000 days
Start: 2020-05-08 — End: ?

## 2020-05-08 NOTE — Unmapped (Signed)
Patient is requesting the following refill  Requested Prescriptions     Pending Prescriptions Disp Refills   ??? WEGOVY 1.7 MG/0.75 ML SUBCUTANEOUS PEN INJECTOR [Pharmacy Med Name: WEGOVY 1.7MG  INJ] 4 mL 0     Sig: INJECT 1.7MG  SUBCUTANEOUSLY ONCE A WEEK       Last OV: 03/05/2020     Last Virtual Visit: Visit date not found     Next OV: 06/07/2020.     Labs: Not applicable this refill

## 2020-05-08 NOTE — Unmapped (Signed)
Can you confirm if she wants to continue with the 1.7 mg vs go up to the 2.4 mg dose?

## 2020-05-09 MED ORDER — WEGOVY 2.4 MG/0.75 ML SUBCUTANEOUS PEN INJECTOR
SUBCUTANEOUS | 3 refills | 0.00000 days | Status: CP
Start: 2020-05-09 — End: 2020-08-07

## 2020-05-09 NOTE — Unmapped (Signed)
New Rx for 2.4 mg q week sent to pharmacy

## 2020-05-17 ENCOUNTER — Encounter: Payer: Self-pay | Admitting: *Deleted

## 2020-05-17 ENCOUNTER — Ambulatory Visit: Payer: BC Managed Care – PPO | Admitting: Podiatry

## 2020-05-17 ENCOUNTER — Encounter: Payer: Self-pay | Admitting: Podiatry

## 2020-05-17 ENCOUNTER — Other Ambulatory Visit: Payer: Self-pay

## 2020-05-17 DIAGNOSIS — L600 Ingrowing nail: Secondary | ICD-10-CM

## 2020-05-17 NOTE — Progress Notes (Signed)
Subjective:  Patient ID: Julia Snow, female    DOB: 1965/12/11,  MRN: [id number]  Chief Complaint  Patient presents with  . Ingrown Toenail    "My left toe is doing better, I think he is going to remove the ingrown from the right toe today"    55 y.o. female presents with the above complaint.  Patient presents with ingrown to the right hallux.  The right medial border of the hallux is causing her a lot of pain and discomfort.  She had a left big toenail removed in the past.  She denies any other acute complaint she would like to have it removed.  She is tried self digging it out peroxide none of which has helped.   Review of Systems: Negative except as noted in the HPI. Denies N/V/F/Ch.  Past Medical History:  Diagnosis Date  . Allergy   . Hypertension   . Miscarriage 2005  . Sleep apnea    does not wear CPAP    Current Outpatient Medications:  .  amLODipine (NORVASC) 10 MG tablet, Take 1 tablet (10 mg total) by mouth daily., Disp: 90 tablet, Rfl: 1 .  Biotin 74128 MCG TABS, Take by mouth., Disp: , Rfl:  .  cetirizine (ZYRTEC) 5 MG tablet, Take by mouth., Disp: , Rfl:  .  ciclopirox (PENLAC) 8 % solution, Apply topically at bedtime. Apply over nail and surrounding skin. Apply daily over previous coat. After seven (7) days, may remove with alcohol and continue cycle., Disp: 6.6 mL, Rfl: 2 .  clobetasol (TEMOVATE) 0.05 % external solution, Apply topically to scalp twice daily., Disp: , Rfl:  .  ferrous sulfate 325 (65 FE) MG EC tablet, Take 325 mg by mouth 3 (three) times daily with meals., Disp: , Rfl:  .  fexofenadine (ALLEGRA) 60 MG tablet, Take by mouth., Disp: , Rfl:  .  gabapentin (NEURONTIN) 300 MG capsule, Take 2 capsules (600 mg total) by mouth daily as needed (shingles pain)., Disp: 30 capsule, Rfl: 1 .  hydrochlorothiazide (HYDRODIURIL) 25 MG tablet, Take 1 tablet (25 mg total) by mouth daily., Disp: 90 tablet, Rfl: 1 .  losartan (COZAAR) 100 MG tablet, Take 1 tablet  (100 mg total) by mouth daily., Disp: 90 tablet, Rfl: 1 .  losartan-hydrochlorothiazide (HYZAAR) 100-25 MG tablet, Take 1 tablet by mouth daily., Disp: , Rfl:  .  metFORMIN (GLUCOPHAGE-XR) 500 MG 24 hr tablet, Take 500 mg by mouth 3 (three) times daily., Disp: , Rfl:  .  Multiple Vitamin (MULTIVITAMIN) tablet, Take 1 tablet by mouth daily., Disp: , Rfl:  .  mupirocin ointment (BACTROBAN) 2 %, APPLY OINTMENT TOPICALLY TO AFFECTED AREA TWICE DAILY FOR 7 DAYS, Disp: , Rfl:  .  Secukinumab 150 MG/ML SOAJ, INJECT THE CONTENTS OF 2 PENS (300 MG) ONCE WEEKLY AT WEEKS 0, 1, 2, 3, AND 4. THEN INJECT 2 PENS (300 MG) EVERY 4 WEEKS, Disp: , Rfl:  .  Semaglutide,0.25 or 0.5MG /DOS, (OZEMPIC, 0.25 OR 0.5 MG/DOSE,) 2 MG/1.5ML SOPN, , Disp: , Rfl:  .  Semaglutide-Weight Management (WEGOVY) 1 MG/0.5ML SOAJ, Inject into the skin., Disp: , Rfl:  .  simvastatin (ZOCOR) 20 MG tablet, Take 1 tablet (20 mg total) by mouth at bedtime., Disp: 90 tablet, Rfl: 1 .  triamcinolone cream (KENALOG) 0.1 %, Apply topically 2 (two) times daily as needed., Disp: , Rfl:  .  triamcinolone cream (KENALOG) 0.5 %, Apply 1 application topically 3 (three) times daily., Disp: 454 g, Rfl: 5 .  vitamin  E 1000 UNIT capsule, Take 1,000 Units by mouth daily., Disp: , Rfl:   Social History   Tobacco Use  Smoking Status Never Smoker  Smokeless Tobacco Never Used    Allergies  Allergen Reactions  . Lisinopril Cough  . Penicillins   . Latex Rash    Other reaction(s): UNKNOWN Other reaction(s): UNKNOWN Other reaction(s): UNKNOWN    Objective:  There were no vitals filed for this visit. There is no height or weight on file to calculate BMI. Constitutional Well developed. Well nourished.  Vascular Dorsalis pedis pulses palpable bilaterally. Posterior tibial pulses palpable bilaterally. Capillary refill normal to all digits.  No cyanosis or clubbing noted. Pedal hair growth normal.  Neurologic Normal speech. Oriented to person,  place, and time. Epicritic sensation to light touch grossly present bilaterally.  Dermatologic Painful ingrowing nail at medial nail borders of the hallux nail right. No other open wounds. No skin lesions.  Orthopedic: Normal joint ROM without pain or crepitus bilaterally. No visible deformities. No bony tenderness.   Radiographs: None Assessment:   1. Ingrowing right great toenail    Plan:  Patient was evaluated and treated and all questions answered.  Ingrown Nail, right with underlying nail dystrophy -Patient elects to proceed with minor surgery to remove ingrown toenail removal today. Consent reviewed and signed by patient. -Ingrown nail excised. See procedure note. -Educated on post-procedure care including soaking. Written instructions provided and reviewed. -Patient to follow up in 2 weeks for nail check.  Procedure: Excision of Ingrown Toenail Location: Right 1st toe medial nail borders. Anesthesia: Lidocaine 1% plain; 1.5 mL and Marcaine 0.5% plain; 1.5 mL, digital block. Skin Prep: Betadine. Dressing: Silvadene; telfa; dry, sterile, compression dressing. Technique: Following skin prep, the toe was exsanguinated and a tourniquet was secured at the base of the toe. The affected nail border was freed, split with a nail splitter, and excised. Chemical matrixectomy was then performed with phenol and irrigated out with alcohol. The tourniquet was then removed and sterile dressing applied. Disposition: Patient tolerated procedure well. Patient to return in 2 weeks for follow-up.   No follow-ups on file.

## 2020-05-28 NOTE — Unmapped (Signed)
[facility] Refill Coordination Note    Specialty Medication(s) to be Shipped:   Inflammatory Disorders: Cosentyx    Other medication(s) to be shipped: No additional medications requested for fill at this time     Tracy Robles, DOB: 08-26-1965  Phone: [phone] (home)       All above HIPAA information was verified with patient.     Was a Nurse, learning disability used for this call? No    Completed refill call assessment today to schedule patient's medication shipment from the [facility] [phone]).       Specialty medication(s) and dose(s) confirmed: Regimen is correct and unchanged.   Changes to medications: Tracy Robles reports no changes at this time.  Changes to insurance: No  Questions for the pharmacist: No    Confirmed patient received Welcome Packet with first shipment. The patient will receive a drug information handout for each medication shipped and additional FDA Medication Guides as required.       DISEASE/MEDICATION-SPECIFIC INFORMATION        For patients on injectable medications: Patient currently has 0 doses left.  Next injection is scheduled for 1st of every month.    SPECIALTY MEDICATION ADHERENCE     Medication Adherence    Patient reported X missed doses in the last month: 0  Specialty Medication: Cosentyx 150 mg/ml   Patient is on additional specialty medications: No  Any gaps in refill history greater than 2 weeks in the last 3 months: no  Demonstrates understanding of importance of adherence: yes  Informant: patient  Reliability of informant: reliable  Confirmed plan for next specialty medication refill: delivery by pharmacy  Refills needed for supportive medications: not needed                cosentyx 150 mg/ml: 0 days of medicine on hand         SHIPPING     Shipping address confirmed in Epic.     Delivery Scheduled: Yes, Expected medication delivery date: 05/29/2020.     Medication will be delivered via Same Day Courier to the prescription address in Epic WAM.    Tracy Robles   [facility] Specialty Technician

## 2020-05-29 MED FILL — COSENTYX PEN 300 MG/2 PENS (150 MG/ML) SUBCUTANEOUS: SUBCUTANEOUS | 28 days supply | Qty: 2 | Fill #2

## 2020-06-07 ENCOUNTER — Encounter: Admit: 2020-06-07 | Discharge: 2020-06-08 | Payer: PRIVATE HEALTH INSURANCE | Attending: Medical | Primary: Medical

## 2020-06-07 DIAGNOSIS — E1165 Type 2 diabetes mellitus with hyperglycemia: Principal | ICD-10-CM

## 2020-06-07 DIAGNOSIS — M25512 Pain in left shoulder: Principal | ICD-10-CM

## 2020-06-07 DIAGNOSIS — I10 Essential (primary) hypertension: Principal | ICD-10-CM

## 2020-06-07 DIAGNOSIS — Z6841 Body Mass Index (BMI) 40.0 and over, adult: Principal | ICD-10-CM

## 2020-06-07 LAB — COMPREHENSIVE METABOLIC PANEL
ALBUMIN: 3.8 g/dL (ref 3.4–5.0)
ALKALINE PHOSPHATASE: 65 U/L (ref 46–116)
ALT (SGPT): 16 U/L (ref 10–49)
ANION GAP: 5 mmol/L (ref 5–14)
AST (SGOT): 20 U/L (ref <=34)
BILIRUBIN TOTAL: 0.7 mg/dL (ref 0.3–1.2)
BLOOD UREA NITROGEN: 12 mg/dL (ref 9–23)
BUN / CREAT RATIO: 13
CALCIUM: 9.6 mg/dL (ref 8.7–10.4)
CHLORIDE: 106 mmol/L (ref 98–107)
CO2: 31.2 mmol/L — ABNORMAL HIGH (ref 20.0–31.0)
CREATININE: 0.94 mg/dL — ABNORMAL HIGH
EGFR CKD-EPI AA FEMALE: 80 mL/min/{1.73_m2} (ref >=60)
EGFR CKD-EPI NON-AA FEMALE: 69 mL/min/{1.73_m2} (ref >=60)
GLUCOSE RANDOM: 106 mg/dL — ABNORMAL HIGH (ref 70–99)
POTASSIUM: 4 mmol/L (ref 3.4–4.5)
PROTEIN TOTAL: 7.5 g/dL (ref 5.7–8.2)
SODIUM: 142 mmol/L (ref 135–145)

## 2020-06-07 LAB — LIPID PANEL
CHOLESTEROL/HDL RATIO SCREEN: 3 (ref 1.0–4.5)
CHOLESTEROL: 116 mg/dL (ref <=200)
HDL CHOLESTEROL: 39 mg/dL — ABNORMAL LOW (ref 40–60)
LDL CHOLESTEROL CALCULATED: 63 mg/dL (ref 40–99)
NON-HDL CHOLESTEROL: 77 mg/dL (ref 70–130)
TRIGLYCERIDES: 72 mg/dL (ref 0–150)
VLDL CHOLESTEROL CAL: 14.4 mg/dL (ref 11–40)

## 2020-06-07 LAB — HEMOGLOBIN A1C
ESTIMATED AVERAGE GLUCOSE: 131 mg/dL
HEMOGLOBIN A1C: 6.2 % — ABNORMAL HIGH (ref 4.8–5.6)

## 2020-06-07 NOTE — Unmapped (Signed)
BP is at goal. Home Bps running 130/80  ??? Continue amlodipine (Norvasc) and losartan/hydrochlorothiazide (Hyzaar) as prescribed  Encouraged healthy, low-salt diet and regular exercise. Recommended pt check BP at home or local pharmacy w/ goal BP less than 130/80.     BP Readings from Last 3 Encounters:   06/07/20 128/82   03/05/20 157/96   01/23/20 146/83     Lab Results   Component Value Date    K 4.2 01/23/2020    K 4.9 06/21/2019    NA 139 01/23/2020    NA 139 06/21/2019    CREATININE 0.91 01/23/2020    CREATININE 0.97 06/21/2019

## 2020-06-07 NOTE — Unmapped (Signed)
Name:  Tracy Robles  DOB: Oct 25, 1965  Today's Date: 06/07/2020  Age:  55 y.o.    A/P:  Problem List Items Addressed This Visit        Endocrine    Type 2 diabetes mellitus with hyperglycemia, without long-term current use of insulin (CMS-HCC) - Primary     Disease status: has been well controlled, repeat A1C  Lab Results   Component Value Date    A1C 6.7 (A) 12/30/2019     Medication:  ?? Continue metformin 1500 mg daily as prescribed- maximally tolerated today  ?? Wegovy 2.4 mg qweek     ADA diet. Exercise daily 30 minutes. Dental exam annually. Foot exam daily/moisturizer daily. Advised to follow up with his/her ophthalmologist annually.  ??  DM Check list:  Foot Exam: 01/23/20  Retinal Exam: 01/30/20  Flu shot: 01/23/20  Pneumovax: UTD, 11/18/18  Baseline EKG: none on file at [facility] [place] without acute ST changes noted in [facility] records 07/01/18  Microalbumin: on ARB  ALBCRERAT  LDL:  Zocor 20 mg qd  Lab Results   Component Value Date    LDL 75 06/21/2019     The 16-XWRU ASCVD risk score Denman George DC Jr., et al., 2013) is: 7.7%           Relevant Orders    Lipid Panel    Comprehensive Metabolic Panel    Hemoglobin A1c       Cardiovascular and Mediastinum    Primary hypertension     BP is at goal. Home Bps running 130/80  ??? Continue amlodipine (Norvasc) and losartan/hydrochlorothiazide (Hyzaar) as prescribed  Encouraged healthy, low-salt diet and regular exercise. Recommended pt check BP at home or local pharmacy w/ goal BP less than 130/80.     BP Readings from Last 3 Encounters:   06/07/20 128/82   03/05/20 157/96   01/23/20 146/83     Lab Results   Component Value Date    K 4.2 01/23/2020    K 4.9 06/21/2019    NA 139 01/23/2020    NA 139 06/21/2019    CREATININE 0.91 01/23/2020    CREATININE 0.97 06/21/2019               Relevant Orders    Lipid Panel    Comprehensive Metabolic Panel       Other    Class 3 severe obesity with body mass index (BMI) of 50.0 to 59.9 in adult (CMS-HCC)     Today???s BP: 122/79. Body mass index is 58.41 kg/m??. 16 lbs weight since last OV. Just got up to 2.4 mg q week     Wt Readings from Last 6 Encounters:   06/07/20 (!) 147.2 kg (324 lb 8 oz)   03/05/20 (!) 154.2 kg (340 lb)   01/23/20 (!) 158.8 kg (350 lb 0.6 oz)   11/22/19 (!) 158.3 kg (349 lb)   08/04/19 (!) 157.4 kg (347 lb 0.6 oz)   06/21/19 (!) 156.5 kg (345 lb 1.3 oz)     ??  Goal: To lose 5-10 pounds in 3 months from the present weight: Weight: (!) 147.2 kg (324 lb 8 oz)  Patient agrees to follow up with me closely for weight check/ lifestyle check.   Diet: Stop drinking soda, sweet tea, juice, and other sugary drinks (if any). Significantly decrease processed carbohydrates, desserts, or unhealthy snacks. Advised that dietician can help make appropriate manageable recommendations.  Referrals - we have discussed referral to weight management clinic, plan to  optimize medical management in our clinic for now  Exercise: Advised to exercise at least 30 minutes per day (walking, treadmill, bicycling, swimming, gym). Advised Pt to start exercising 10 minutes 3 times per week and increase by 5 minutes every 2 weeks until exercising 30 minutes 3 times per week, then add 1 day.           Relevant Orders    Lipid Panel    Comprehensive Metabolic Panel    Hemoglobin A1c    Left shoulder pain     Hx of L shoulder pain x 5 years. Mostly anterior/over humerus, but now having some posterior shoulder pain. Exam c/w with component of bicep tendonitis. Continue Tylenol, activity modification. Referral to ortho for further eval         Relevant Orders    Ambulatory referral to Orthopedic Surgery          Medication adherence and barriers to the treatment plan have been addressed. Opportunities to optimize healthy behaviors have been discussed. Patient / caregiver voiced understanding.      Return in about 3 months (around 09/04/2020) for Recheck- cdm, wt management wegovy 2.4, met 1500.    S:  Tracy Robles is a 55 y.o. female who presents for follow up of weight management. Up to [facility] 2.4 mg every day, but received denial letter from insurance and needs to have weight check provided. She is tolerating well and pleased with weight loss. Working on healthier eating habits. Eating less fast food in general.     HTN-  138/84- home BP  No CP, SOB, L/D or swelling in ext    DM-  Metformin 1500 mg every day  Wegovy 2.4 mg every day   Due for labs  Not checking blood sugar regularly  No L/D or shakiness    Mentions chronic issue with L shoulder x 5 years. Feels like a strained muscle. 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 FROM without significant pain. Pt is mostly anterior humerus, but recently having some posterior shoulder pain. No recent injury or trauma. No N/T or weakness.     Wt Readings from Last 12 Encounters:   06/07/20 (!) 147.2 kg (324 lb 8 oz)   03/05/20 (!) 154.2 kg (340 lb)   01/23/20 (!) 158.8 kg (350 lb 0.6 oz)   11/22/19 (!) 158.3 kg (349 lb)   08/04/19 (!) 157.4 kg (347 lb 0.6 oz)   06/21/19 (!) 156.5 kg (345 lb 1.3 oz)   02/10/19 (!) 158.3 kg (349 lb 0.6 oz)   11/18/18 (!) 163.8 kg (361 lb 1.3 oz)   11/04/18 (!) 164.7 kg (363 lb 1.3 oz)   12/21/03 (!) 152.4 kg (336 lb)                  ROS:  A 12 point review of systems was negative except for pertinent items noted in the HPI.    Past medical history, family history, surgical history and social history personally reviewed and verified with patient.     PROBLEM LIST  Patient Active Problem List   Diagnosis   ??? Class 3 severe obesity with body mass index (BMI) of 50.0 to 59.9 in adult (CMS-HCC)   ??? Eczema   ??? Primary hypertension   ??? Psoriasis   ??? Sleep apnea   ??? Type 2 diabetes mellitus with hyperglycemia, without long-term current use of insulin (CMS-HCC)   ??? Encounter to establish care   ??? Back pain   ???  Administrative encounter   ??? Ingrown toenail of right foot   ??? Left shoulder pain       O:  Vitals:    06/07/20 1002   BP: 128/82   Pulse: 92   Temp: 36 ??C (96.8 ??F)   SpO2: 100%     General:  Well appearing, well nourished in no distress.   Skin: no obvious rash or  prominent lesions    Neck: Supple, no obvious JVD  Cardiovascular:  rrr no mrg, no peripheral edema  Eyes:  conjunctiva clear, sclera non-icteric   Ears/nose/throat: no obvious external deformities  Respiratory: CTAB, breathing comfortably  Musculoskeletal:  Normal gait and station. L Shoulder-no gross abnormalities noted, range of motion full without significant pain tenderness to biceps insertion positive speeds test, 5/5 strength, neurovascularly intact  Neurologic: moves extremities symmetrically, cranial nerves grossly intact  Hematologic: no obvious ecchymosis     Psychiatric:  Oriented X3, intact judgement and insight, normal mood and affect.      Edison Nicholson A Daschel Roughton, PAC

## 2020-06-07 NOTE — Unmapped (Signed)
Today???s BP: 122/79. Body mass index is 58.41 kg/m??. 16 lbs weight since last OV. Just got up to 2.4 mg q week     Wt Readings from Last 6 Encounters:   06/07/20 (!) 147.2 kg (324 lb 8 oz)   03/05/20 (!) 154.2 kg (340 lb)   01/23/20 (!) 158.8 kg (350 lb 0.6 oz)   11/22/19 (!) 158.3 kg (349 lb)   08/04/19 (!) 157.4 kg (347 lb 0.6 oz)   06/21/19 (!) 156.5 kg (345 lb 1.3 oz)     ??  Goal: To lose 5-10 pounds in 3 months from the present weight: Weight: (!) 147.2 kg (324 lb 8 oz)  Patient agrees to follow up with me closely for weight check/ lifestyle check.   Diet: Stop drinking soda, sweet tea, juice, and other sugary drinks (if any). Significantly decrease processed carbohydrates, desserts, or unhealthy snacks. Advised that dietician can help make appropriate manageable recommendations.  Referrals - we have discussed referral to weight management clinic, plan to optimize medical management in our clinic for now  Exercise: Advised to exercise at least 30 minutes per day (walking, treadmill, bicycling, swimming, gym). Advised Pt to start exercising 10 minutes 3 times per week and increase by 5 minutes every 2 weeks until exercising 30 minutes 3 times per week, then add 1 day.

## 2020-06-07 NOTE — Unmapped (Signed)
Hx of L shoulder pain x 5 years. Mostly anterior/over humerus, but now having some posterior shoulder pain. Exam c/w with component of bicep tendonitis. Continue Tylenol, activity modification. Referral to ortho for further eval

## 2020-06-07 NOTE — Unmapped (Addendum)
Disease status: has been well controlled, repeat A1C  Lab Results   Component Value Date    A1C 6.7 (A) 12/30/2019     Medication:  ?? Continue metformin 1500 mg daily as prescribed- maximally tolerated today  ?? Wegovy 2.4 mg qweek     ADA diet. Exercise daily 30 minutes. Dental exam annually. Foot exam daily/moisturizer daily. Advised to follow up with his/her ophthalmologist annually.  ??  DM Check list:  Foot Exam: 01/23/20  Retinal Exam: 01/30/20  Flu shot: 01/23/20  Pneumovax: UTD, 11/18/18  Baseline EKG: none on file at [facility] [place] without acute ST changes noted in [facility] records 07/01/18  Microalbumin: on ARB  ALBCRERAT  LDL:  Zocor 20 mg qd  Lab Results   Component Value Date    LDL 75 06/21/2019     The 16-XWRU ASCVD risk score Denman George DC Jr., et al., 2013) is: 7.7%

## 2020-06-08 NOTE — Unmapped (Signed)
PA for [facility] re-submitted per provider request. Awaiting decision from insurance company.

## 2020-06-15 NOTE — Unmapped (Signed)
Patient called nurse line to notify the doctor that she was only able to take one cosentyx injection due to malfunction of the other pen.  Sent message to patient to alert the manufacturer of Cosentyx to alert them about the other pen.  Also informed her that she needs to take the two injections as soon as it is possible to get another refill.

## 2020-06-16 NOTE — Unmapped (Signed)
[facility] Pharmacist Intervention    Type of intervention: Cosentyx misfire    Medication: Cosentyx    Problem: Tracy Robles called in to the pharmacy to report an injection misfire. She spoke with a technician, and I was not yet able to interview to see what happened. It seems she was able to inject one pen, but the other malfunctioned.    Intervention: I called [employer] to verify plan - patient needs to call the [employer] to report issue - [phone]. They will work to issue a replacement dose.    I also checked with our processing technician - insurance will allow another refill on 3/9. I asked her to call the pharmacy back if she would like a delivery on this date.    Follow up needed: patient to call back to schedule.     Approximate time spent: 15 minutes    Essynce Munsch A Desiree Lucy [facility] Specialty Pharmacist

## 2020-06-18 NOTE — Unmapped (Signed)
Received fax from patient insurance stating that provider office must wait 180 days before submitting another PA for [facility]. Per the denial from the insurance, the patient must request an appeal within 6 months from the denial date (05/2020) Call placed to patient to advise her of appeal rights and give her the information outlined in the letter. Patient not home. Message left for patient to return call to the clinic.

## 2020-06-22 DIAGNOSIS — I10 Essential (primary) hypertension: Principal | ICD-10-CM

## 2020-06-22 MED ORDER — AMLODIPINE 10 MG TABLET
ORAL_TABLET | 0 refills | 0 days
Start: 2020-06-22 — End: ?

## 2020-06-22 NOTE — Unmapped (Signed)
[facility] Refill Coordination Note    Specialty Medication(s) to be Shipped:   Inflammatory Disorders: Cosentyx    Other medication(s) to be shipped: No additional medications requested for fill at this time     Zinnia Tindall, DOB: Feb 28, 1966  Phone: [phone] (home)       All above HIPAA information was verified with patient.     Was a Nurse, learning disability used for this call? No    Completed refill call assessment today to schedule patient's medication shipment from the [facility] [phone]).       Specialty medication(s) and dose(s) confirmed: Regimen is correct and unchanged.   Changes to medications: Charmelle reports no changes at this time.  Changes to insurance: No  Questions for the pharmacist: No    Confirmed patient received Welcome Packet with first shipment. The patient will receive a drug information handout for each medication shipped and additional FDA Medication Guides as required.       DISEASE/MEDICATION-SPECIFIC INFORMATION        For patients on injectable medications: Patient currently has 0 doses left.  Next injection is scheduled for 3/28.    SPECIALTY MEDICATION ADHERENCE     Medication Adherence    Patient reported X missed doses in the last month: 0  Specialty Medication: Cosentyx 150 mg/ml  Patient is on additional specialty medications: No                SHIPPING     Shipping address confirmed in Epic.     Delivery Scheduled: Yes, Expected medication delivery date: 3/16.     Medication will be delivered via Same Day Courier to the prescription address in Epic WAM.    Westley Gambles   [facility] Specialty Technician

## 2020-06-25 MED ORDER — AMLODIPINE 10 MG TABLET
ORAL_TABLET | 0 refills | 0 days | Status: CP
Start: 2020-06-25 — End: ?

## 2020-06-27 MED FILL — COSENTYX PEN 300 MG/2 PENS (150 MG/ML) SUBCUTANEOUS: SUBCUTANEOUS | 28 days supply | Qty: 2 | Fill #3

## 2020-07-03 NOTE — Unmapped (Signed)
Patient walked into the office wanting to speak with the nurse in regards to her medication, WEGOVY. Patient would like the nurse to give her a call back, preferably on Thursdays when she is off that way she doesn't miss the phone call.

## 2020-07-05 DIAGNOSIS — M549 Dorsalgia, unspecified: Principal | ICD-10-CM

## 2020-07-05 NOTE — Unmapped (Signed)
Spoke with the patient regarding the appeal letter received. Patient states that she will get assistance with the appeal via her HR rep. Patient would like to know if provider would like her to continue Metformin until the PA gets sorted. Patient states that she will need new rx if she is to continue. Message sent to provider at this time.

## 2020-07-05 NOTE — Unmapped (Signed)
Pt should continue her metformin at the maximally tolerated dose (1500 mg daily).

## 2020-07-06 MED ORDER — GABAPENTIN 300 MG CAPSULE
ORAL_CAPSULE | Freq: Two times a day (BID) | ORAL | 3 refills | 90 days | Status: CP
Start: 2020-07-06 — End: 2020-10-04

## 2020-07-06 NOTE — Unmapped (Signed)
Patient is requesting the following refill  Requested Prescriptions     Pending Prescriptions Disp Refills   ??? gabapentin (NEURONTIN) 300 MG capsule [Pharmacy Med Name: Gabapentin 300 MG Oral Capsule] 120 capsule 0     Sig: Take 2 capsules by mouth twice daily       Recent Visits  Date Type Provider Dept   06/07/20 Office Visit [address] [place], [facility] [address] [place] [address] [facility]   03/05/20 Office Visit Hillary [place], [facility] [address] [facility]   01/23/20 Office Visit Hillary [place], [facility] [address] [place]   11/22/19 Office Visit Hillary [place], [facility] [address] [place]   08/04/19 Office Visit Hillary Aspen [place], [facility] [address] [place]   Showing recent visits within past 365 days with a meds authorizing provider and meeting all other requirements  Future Appointments  Date Type Provider Dept   09/06/20 Appointment Hillary Aspen [place], [facility] [address] [place]   Showing future appointments within next 365 days with a meds authorizing provider and meeting all other requirements       Labs: Not applicable this refill

## 2020-07-19 ENCOUNTER — Encounter: Admit: 2020-07-19 | Discharge: 2020-07-20 | Payer: PRIVATE HEALTH INSURANCE

## 2020-07-19 ENCOUNTER — Encounter: Admit: 2020-07-19 | Discharge: 2020-07-20 | Payer: PRIVATE HEALTH INSURANCE | Attending: Family | Primary: Family

## 2020-07-19 MED ADMIN — triamcinolone acetonide (KENALOG-40) injection 20 mg: 20 mg | @ 15:00:00 | Stop: 2020-07-19

## 2020-07-19 NOTE — Unmapped (Signed)
Thank you for choosing [facility]!  We appreciate the opportunity to participate in your care. Please let us know if we can be of assistance your orthopaedic issues in the future.     If you have questions or concerns, please do not hesitate to contact us by [facility] or by calling [phone] to speak with one of our clinical support sports team members.     [facility] MyChart Website: [email or web address]      Appointment Scheduling: [phone]    Walk-in hours:        [facility]:  Monday - Friday 8 am - 5 pm  [address]  [address] Floor, [address]  [place], [place]  [address]      __________________________________________________________________________    Patient Specific Information: Return to activities as tolerated.  Work on exercises below as tolerated.  Please contact me if you do not note good relief with this injection over the next 2 weeks.  We can do an ultrasound-guided injection in the smaller joint at the top of the shoulder.  Please contact me with any questions or problems.You received a corticosteroid injection to reduce pain and inflammation.  Please note that it can take up to 2 weeks for this injection to fully work.  While many people will feel relief sooner, please be patient.      The injection contained a corticosteroid and a numbing agent.  The numbing agent can last for 1-6 hours.  After this wears off you may have increased pain until the steroid has a chance to work.    What are some of the possible side effects of a steroid injection?    Common side effects:  temporarily elevated blood sugar (in diabetic patients) that can last a few days   flushing of the skin, especially the face  temporary rise in blood pressure  discoloration or atrophy of the skin at the injection site    Call your doctor at once if you have:  persistent worsening pain or swelling, fever;  blurred vision, tunnel vision, eye pain, or seeing halos around lights;  fast or slow heartbeats;  increased blood pressure that is associated with severe headache, blurred vision, pounding in your neck or ears, anxiety, nosebleed;  headaches, ringing in your ears, dizziness, nausea, vision problems, pain behind your eyes    This is not a complete list of side effects and others may occur. Call your provider for medical advice about side effects.     What other drugs may be affected after the injection?  Many drugs can interact with steroids. Not all possible interactions are listed here. Tell your doctor about all your current medicines and any you start or stop using, especially:  an antibiotic or antifungal medication;  birth control pills or hormone replacement therapy;  a blood thinner (warfarin, Coumadin, and others);  a diuretic or water pill;  insulin or oral diabetes medicine;  medicine to treat tuberculosis;  a nonsteroidal anti-inflammatory drug or NSAID (aspirin, ibuprofen, naproxen, diclofenac, indomethacin, Advil, Aleve, Celebrex, and many others); or  seizure medication.      You can resume your normal daily activities, but consider resting the injected area for the next few days.     Patient Education        Ulnar Neuropathy (Handlebar Palsy): Exercises  Introduction  Here are some examples of exercises for you to try. The exercises may be suggested for a condition or for rehabilitation. Start each exercise slowly. Ease off the  exercises if you start to have pain.  You will be told when to start these exercises and which ones will work best for you.  How to do the exercises  Neck rotation    1. Sit in a firm chair, or stand up straight.  2. Keeping your chin level, turn your head to the right, and hold for 15 to 30 seconds.  3. Turn your head to the left, and hold for 15 to 30 seconds.  4. Repeat 2 to 4 times to each side.  Shoulder blade squeeze    1. While standing with your arms at your sides, squeeze your shoulder blades together. Do not raise your shoulders as you are squeezing.  2. Hold for 6 seconds.  3. Repeat 8 to 12 times.  Neck stretches    1. Look straight ahead, and tip your right ear to your right shoulder. Do not let your left shoulder rise as you tip your head to the right.  2. Hold for 15 to 30 seconds.  3. Tilt your head to the left. Do not let your right shoulder rise as you tip your head to the left.  4. Hold for 15 to 30 seconds.  5. Repeat 2 to 4 times to each side.  Elbow flexion and extension    If this exercise causes numbness, tingling, or pain in your hand, ease off of the stretch. You should not have symptoms as you stretch. If you cannot back off enough so that you can do the exercise without symptoms, stop doing the exercise right away.  1. Stand with your arms relaxed at your sides.  2. With your affected arm, gently bend your elbow up toward you as far as possible.  3. Then straighten your arm as much as you can.  4. Repeat 2 to 4 times.  Wrist flexor stretch    If this exercise causes numbness, tingling, or pain in your hand, ease off of the stretch. You should not have symptoms as you stretch. If you cannot back off enough so that you can do the exercise without symptoms, stop doing the exercise right away.  1. Extend your affected arm in front of you with your palm facing away from your body.  2. Bend back your wrist on your affected arm, pointing your hand up toward the ceiling.  3. With your other hand, gently bend your wrist farther until you feel a mild to moderate stretch in your forearm.  4. Hold for at least 15 to 30 seconds.  5. Repeat 2 to 4 times.  6. Repeat steps 1 through 5, but this time extend your affected arm in front of you with your palm facing up. Then bend back your wrist, pointing your hand toward the floor.  Wrist flexion and extension    If this exercise causes numbness, tingling, or pain in your hand, ease off of the stretch. You should not have symptoms as you stretch. If you cannot back off enough so that you can do the exercise without symptoms, stop doing the exercise right away.  1. Place your forearm on a table, with your affected hand and wrist extended beyond the table, palm down.  2. Slowly bend your wrist to move your hand upward and allow your hand to close into a fist. Hold for about 6 seconds.  3. Then lower your hand and allow your fingers to relax. Hold this position for about 6 seconds. You should feel a gentle stretch.  4.  Repeat 8 to 12 times.  Follow-up care is a key part of your treatment and safety. Be sure to make and go to all appointments, and call your doctor if you are having problems. It's also a good idea to know your test results and keep a list of the medicines you take.  Where can you learn more?  Go to [facility] at [email or web address] forces [email or web address] in the Menu. Enter [id number] in the search box to learn more about Ulnar Neuropathy (Handlebar Palsy): Exercises.  Current as of: October 13, 2019??????????????????????????????Content Version: 13.2  ?? 2006-2022 [employer]wise, [employer].   Care instructions adapted under license by [facility]. If you have questions about a medical condition or this instruction, always ask your healthcare professional. Healthwise, Incorporated disclaims any warranty or liability for your use of this information.           __________________________________________________________________________

## 2020-07-19 NOTE — Unmapped (Signed)
[name]  Date: 07/19/2020     Primary Care Physician: [name], PAC          ASSESSMENT:    ICD-10-CM   1. Left shoulder pain, unspecified chronicity  M25.512       PLAN:  I recommended diagnostic and therapeutic subacromial steroid injection.  I think her paresthesias more likely related to cubital tunnel symptoms rather than a cervical radiculopathy.  If she gets incomplete relief with this steroid injection, we could consider ultrasound-guided acromioclavicular glenohumeral joint injections.  We will proceed with the following treatment plan:  1. Medications: OTC analgesia as needed  2. DME/Cast: None    3. PT/OT: Home exercise program provided  4. Injections:   Subacromial Bursa Injection    Consent   After discussing the various treatment options for the condition, It was agreed that a corticosteroid injection would be the next step in treatment. The nature of and the indications for a corticosteroid and / or local anaesthetic injection were reviewed in detail with the patient today. The inherent risks of injection including infection, allergic reaction, increased pain, incomplete relief or temporary relief of symptoms, alterations of blood glucose levels requiring careful monitoring and treatment as indicated, tendon, ligament or articular cartilage rupture or degeneration, nerve injury, skin depigmentation, and/or fatty atrophy were discussed.   Procedure   After the risks and benefits of the procedure were explained,verbal consent was given, and a procedural time-out was performed. Anatomic landmarks identified  The site was marked and prepped with Chlorhexidine solution.  The injection site was anesthetized with ethyl chloride.  The subacromial bursa was injected with 20 milligrams of Kenalog, ,and 2 cc of 0.5% Ropivicaine 3cc sterile saline and using a sterile technique and a 22 gauge 1.5 inch needle. During injection, there was unrestricted flow and care was taken not to inject corticosteroid into the skin or subcutaneous tissues. There were no complications during the procedure.      Post procedure a sterile band-aide was applied. Post-injection instructions were given regarding post-procedure care, when to follow up in clinic and what to expect from the procedure. The patient tolerated the injection well and was discharged without complication.            Scheduling Notes:  Return if symptoms worsen or fail to improve.    - X-rays to be ordered at next visit: None     Requested Prescriptions      No prescriptions requested or ordered in this encounter        Orders Placed This Encounter   Procedures   ??? XR Shoulder 3 Or More Views Left       SUBJECTIVE:  Chief Complaint: Left shoulder pain  History of Present Illness:   Tracy Robles is a 55 y.o. female who presents for left shoulder pain for several months without injury. 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 Pain with FE. 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  Occasional paresthesia into the ring and small fingers when sleeping.  Some neck pain noted.  Pain Score:   (as reported on intake)        ROS:    .       Pertinent positives and negatives are documented in the HPI. All other systems reviewed are negative. Patient was instructed to follow-up with the appropriate provider as necessary for all pertinent positives not related to today's encounter.    Medical History:  Past Medical History:   Diagnosis Date   ??? Diabetes mellitus (CMS-HCC)    ??? Eczema    ??? High blood pressure        Surgical History:  No past surgical history on file.    Medications:  ??? amLODIPine (NORVASC) 10 MG tablet Take 1 tablet by mouth once daily   ??? biotin 1 mg cap Take by mouth.   ??? cetirizine (ZYRTEC) 5 MG tablet Take 5 mg by mouth daily.   ??? clobetasoL (TEMOVATE) 0.05 % external solution Apply topically to scalp twice daily.   ??? clobetasol (TEMOVATE) 0.05 % ointment Apply topically to rash on body twice daily.   ??? empty container Misc Use as directed to dispose of Cosentyx pens.   ??? ferrous sulfate 325 (65 FE) MG EC tablet Take by mouth.   ??? fexofenadine (ALLEGRA) 60 MG tablet Take 60 mg by mouth Two (2) times a day. Administer with water only; do not administer with fruit juices   ??? gabapentin (NEURONTIN) 300 MG capsule Take 2 capsules (600 mg total) by mouth two (2) times a day.   ??? losartan-hydrochlorothiazide (HYZAAR) 100-25 mg per tablet Take 1 tablet by mouth daily.   ??? metFORMIN (GLUCOPHAGE-XR) 500 MG 24 hr tablet Take 3 tablets (1,500 mg total) by mouth daily with evening meal.   ??? multivit with min-folic acid (MULTIVITAMIN GUMMIES) 200 mcg Chew Chew.   ??? multivitamin (MULTIVITAMIN) per tablet Take 1 tablet by mouth.    ??? mupirocin (BACTROBAN) 2 % ointment APPLY OINTMENT TOPICALLY TO AFFECTED AREA TWICE DAILY FOR 7 DAYS   ??? omega-3 acid ethyl esters (LOVAZA) 1 gram capsule Take 1,000 mg by mouth.    ??? secukinumab (COSENTYX PEN, 2 PENS,) 150 mg/mL PnIj injection Inject the contents of 2 pens (300 mg total) under the skin every twenty-eight (28) days.   ??? simvastatin (ZOCOR) 20 MG tablet Take 1 tablet (20 mg total) by mouth nightly.   ??? triamcinolone (KENALOG) 0.1 % cream Apply topically twice daily as needed only.   ??? triamcinolone (KENALOG) 0.1 % ointment Frequency:PHARMDIR   Dosage:0.0     Instructions:  Note:apply to affected areas twice daily until smooth. Dose: 0.1%   ??? vitamin E 1000 UNIT capsule Take 1,000 Units by mouth.   ??? WEGOVY 2.4 MG/0.75 ML SUBCUTANEOUS PEN INJECTOR Inject 2.4 mg under the skin every seven (7) days.       Allergies:  Lisinopril, Latex, and Penicillins    Social History:  Social History     Socioeconomic History   ??? Marital status: Married     Spouse name: Not on file   ??? Number of children: Not on file   ??? Years of education: Not on file   ??? Highest education level: Not on file   Occupational History   ??? Not on file   Tobacco Use   ??? Smoking status: Never Smoker   ??? Smokeless tobacco: Never Used   Vaping Use   ??? Vaping Use: Never used   Substance and Sexual Activity   ??? Alcohol use: Yes     Alcohol/week: 0.0 standard drinks     Comment: rare   ??? Drug use: Never   ??? Sexual activity: Not Currently     Partners: Male     Birth control/protection: None   Other Topics Concern   ??? Do you use sunscreen? No   ??? Tanning bed use? No   ??? Are you easily burned? No   ???  Excessive sun exposure? No   ??? Blistering sunburns? No   Social History Narrative   ??? Not on file     Social Determinants of Health     Financial Resource Strain: Not on file   Food Insecurity: Not on file   Transportation Needs: Not on file   Physical Activity: Not on file   Stress: Not on file   Social Connections: Not on file       Family History:  Family History   Problem Relation Age of Onset   ??? Diabetes Father    ??? Hypertension Father    ??? Kidney disease Father    ??? Clotting disorder Father         Blood clot   ??? Diabetes Sister    ??? Hypertension Mother    ??? Breast cancer Maternal Aunt    ??? Stroke Maternal Grandmother    ??? Melanoma Neg Hx    ??? Basal cell carcinoma Neg Hx    ??? Squamous cell carcinoma Neg Hx    ??? Ovarian cancer Neg Hx          OBJECTIVE:  DETAILED PHYSICAL EXAM   General Appearance ?? well-nourished, in no acute distress.  Estimated body mass index is 58.41 kg/m?? as calculated from the following:    Height as of 06/07/20: 158.8 cm (5' 2.5).    Weight as of 06/07/20: 147.2 kg (324 lb 8 oz).   Mood and Affect ?? alert, cooperative and pleasant.   Gait  ?? smooth and steady   Cardiovascular ?? well-perfused distally and no swelling.   Sensation ?? sensation to light touch distally normal      No obvious deformity left shoulder no tenderness palpation of acromioclavicular joint.  Forward elevation 0-120 degrees with pain.  External rotation limited side to 45 degrees with pain.  Internal rotation to back pocket with pain.  Positive Hawkins.  Positive Neer's.  Pain with 4+/5 strength in empty can testing.  5/5 resisted external rotation with arms at sides.  Negative belly press.  Positive Tinel's at the elbow.  Negative Tinel's at the wrist.  Negative Phalen's.  Negative reverse Phalen's.      Test Results  Imaging:  X-rays of the left shoulder were obtained today and independently interpreted by myself. These reveal Moderate glenohumeral and acromioclavicular osteoarthrosis.   ??  Cortical irregularity of the greater tuberosity, suggestive of chronic rotator cuff disease.  ??  Os acromiale.    *This note was created using Scientist, clinical (histocompatibility and immunogenetics). Errors may persist despite proofreading.       Ivor Reining, FNP

## 2020-07-20 NOTE — Unmapped (Signed)
Tracy Robles reports her psoriasis is stable - she continues to have an occasional flare, but is overall improved. She continues to use topicals PRN.      She reports she hasn't received her replacement product from [employer] - I'll see if our team can help follow up.     [facility] Clinical Assessment & Refill Coordination Note    Tracy Robles, Kinde: 14-Sep-1965  Phone: [phone] (home)     All above HIPAA information was verified with patient.     Was a Nurse, learning disability used for this call? No    Specialty Medication(s):   Inflammatory Disorders: Cosentyx     Current Outpatient Medications   Medication Sig Dispense Refill   ??? amLODIPine (NORVASC) 10 MG tablet Take 1 tablet by mouth once daily 30 tablet 0   ??? biotin 1 mg cap Take by mouth.     ??? cetirizine (ZYRTEC) 5 MG tablet Take 5 mg by mouth daily.     ??? clobetasoL (TEMOVATE) 0.05 % external solution Apply topically to scalp twice daily. 50 mL 6   ??? clobetasol (TEMOVATE) 0.05 % ointment Apply topically to rash on body twice daily. 60 g 6   ??? empty container Misc Use as directed to dispose of Cosentyx pens. 1 each 2   ??? ferrous sulfate 325 (65 FE) MG EC tablet Take by mouth.     ??? fexofenadine (ALLEGRA) 60 MG tablet Take 60 mg by mouth Two (2) times a day. Administer with water only; do not administer with fruit juices     ??? gabapentin (NEURONTIN) 300 MG capsule Take 2 capsules (600 mg total) by mouth two (2) times a day. 360 capsule 3   ??? losartan-hydrochlorothiazide (HYZAAR) 100-25 mg per tablet Take 1 tablet by mouth daily. 30 tablet 11   ??? metFORMIN (GLUCOPHAGE-XR) 500 MG 24 hr tablet Take 3 tablets (1,500 mg total) by mouth daily with evening meal. 270 tablet 3   ??? multivit with min-folic acid (MULTIVITAMIN GUMMIES) 200 mcg Chew Chew.     ??? multivitamin (MULTIVITAMIN) per tablet Take 1 tablet by mouth.      ??? mupirocin (BACTROBAN) 2 % ointment APPLY OINTMENT TOPICALLY TO AFFECTED AREA TWICE DAILY FOR 7 DAYS     ??? omega-3 acid ethyl esters (LOVAZA) 1 gram capsule Take 1,000 mg by mouth.      ??? secukinumab (COSENTYX PEN, 2 PENS,) 150 mg/mL PnIj injection Inject the contents of 2 pens (300 mg total) under the skin every twenty-eight (28) days. 4 mL 11   ??? simvastatin (ZOCOR) 20 MG tablet Take 1 tablet (20 mg total) by mouth nightly. 90 tablet 3   ??? triamcinolone (KENALOG) 0.1 % cream Apply topically twice daily as needed only. 454 g 1   ??? triamcinolone (KENALOG) 0.1 % ointment Frequency:PHARMDIR   Dosage:0.0     Instructions:  Note:apply to affected areas twice daily until smooth. Dose: 0.1%     ??? vitamin E 1000 UNIT capsule Take 1,000 Units by mouth.     ??? WEGOVY 2.4 MG/0.75 ML SUBCUTANEOUS PEN INJECTOR Inject 2.4 mg under the skin every seven (7) days. 12 each 3     No current facility-administered medications for this visit.        Changes to medications: Tracy Robles reports no changes at this time.    Allergies   Allergen Reactions   ??? Lisinopril Cough   ??? Latex      Other reaction(s): UNKNOWN   ???  Penicillins      Other reaction(s): UNSPECIFIED       Changes to allergies: No    SPECIALTY MEDICATION ADHERENCE     Cosentyx 150 mg/ml:  0 LEFT  Medication Adherence    Patient reported X missed doses in the last month: 0  Specialty Medication: Cosentyx          Specialty medication(s) dose(s) confirmed: Regimen is correct and unchanged.     Are there any concerns with adherence? No    Adherence counseling provided? Not needed    CLINICAL MANAGEMENT AND INTERVENTION      Clinical Benefit Assessment:    Do you feel the medicine is effective or helping your condition? Yes    Clinical Benefit counseling provided? Not needed    Adverse Effects Assessment:    Are you experiencing any side effects? No    Are you experiencing difficulty administering your medicine? No    Quality of Life Assessment:    How many days over the past month did your psoriasis  keep you from your normal activities? For example, brushing your teeth or getting up in the morning. 0    Have you discussed this with your provider? Not needed    Acute Infection Status:    Acute infections noted within Epic:  No active infections    Patient reported infection: None    Therapy Appropriateness:    Is therapy appropriate? Yes, therapy is appropriate and should be continued    DISEASE/MEDICATION-SPECIFIC INFORMATION      For patients on injectable medications: Patient currently has 0 doses left.  Next injection is scheduled for 4/25.    PATIENT SPECIFIC NEEDS     - Does the patient have any physical, cognitive, or cultural barriers? No    - Is the patient high risk? No    - Does the patient require a Care Management Plan? No     - Does the patient require physician intervention or other additional services (i.e. nutrition, smoking cessation, social work)? No      SHIPPING     Specialty Medication(s) to be Shipped:   Inflammatory Disorders: Cosentyx    Other medication(s) to be shipped: No additional medications requested for fill at this time     Changes to insurance: No    Delivery Scheduled: Yes, Expected medication delivery date: Monday, 4/18.     Medication will be delivered via Same Day Courier to the confirmed prescription address in [facility].    The patient will receive a drug information handout for each medication shipped and additional FDA Medication Guides as required.  Verified that patient has previously received a Conservation officer, historic buildings.    All of the patient's questions and concerns have been addressed.    Tracy Robles   [facility] Specialty Pharmacist

## 2020-07-30 MED FILL — COSENTYX PEN 300 MG/2 PENS (150 MG/ML) SUBCUTANEOUS: SUBCUTANEOUS | 28 days supply | Qty: 2 | Fill #4

## 2020-08-08 DIAGNOSIS — E1165 Type 2 diabetes mellitus with hyperglycemia: Principal | ICD-10-CM

## 2020-08-08 DIAGNOSIS — Z6841 Body Mass Index (BMI) 40.0 and over, adult: Principal | ICD-10-CM

## 2020-08-08 MED ORDER — METFORMIN ER 500 MG TABLET,EXTENDED RELEASE 24 HR
ORAL_TABLET | Freq: Every day | ORAL | 3 refills | 90 days
Start: 2020-08-08 — End: ?

## 2020-08-08 NOTE — Unmapped (Signed)
Received phone message from he patient stating that she needs refills for [facility] as well as Metformin. Message sent to provider for clarification on Wegovy dose.

## 2020-08-09 MED ORDER — METFORMIN ER 500 MG TABLET,EXTENDED RELEASE 24 HR
ORAL_TABLET | Freq: Every day | ORAL | 3 refills | 90 days | Status: CP
Start: 2020-08-09 — End: ?

## 2020-08-10 MED ORDER — WEGOVY 2.4 MG/0.75 ML SUBCUTANEOUS PEN INJECTOR
SUBCUTANEOUS | 3 refills | 0 days | Status: CP
Start: 2020-08-10 — End: 2020-11-08

## 2020-08-10 NOTE — Unmapped (Signed)
Wegovy 2.4 mg q week refilled.

## 2020-08-10 NOTE — Unmapped (Signed)
Addended by: Sonny Dandy A on: 08/10/2020 01:55 PM     Modules accepted: Orders

## 2020-08-16 DIAGNOSIS — L409 Psoriasis, unspecified: Principal | ICD-10-CM

## 2020-08-17 NOTE — Unmapped (Signed)
Prior authorization initiated vis Cover My Meds for Wegovy 2.4/0.75 dose. Awaiting decision from [employer].

## 2020-08-23 NOTE — Unmapped (Signed)
[facility] Refill Coordination Note    Specialty Medication(s) to be Shipped:   Inflammatory Disorders: Cosentyx    Other medication(s) to be shipped: No additional medications requested for fill at this time     Tracy Robles, DOB: 11-06-1965  Phone: [phone] (home)       All above HIPAA information was verified with patient.     Was a Nurse, learning disability used for this call? No    Completed refill call assessment today to schedule patient's medication shipment from the [facility] [phone]).  All relevant notes have been reviewed.     Specialty medication(s) and dose(s) confirmed: Regimen is correct and unchanged.   Changes to medications: Tracy Robles reports no changes at this time.  Changes to insurance: No  New side effects reported not previously addressed with a pharmacist or physician: None reported  Questions for the pharmacist: No    Confirmed patient received a Conservation officer, historic buildings and a Surveyor, mining with first shipment. The patient will receive a drug information handout for each medication shipped and additional FDA Medication Guides as required.       DISEASE/MEDICATION-SPECIFIC INFORMATION        For patients on injectable medications: Patient currently has 0 doses left.  Next injection is scheduled for 09/08/2020.    SPECIALTY MEDICATION ADHERENCE     Medication Adherence    Patient reported X missed doses in the last month: 0  Specialty Medication: Cosentyx 150 mg/ml   Patient is on additional specialty medications: No  Any gaps in refill history greater than 2 weeks in the last 3 months: no  Demonstrates understanding of importance of adherence: yes  Informant: patient  Reliability of informant: reliable  Confirmed plan for next specialty medication refill: delivery by pharmacy  Refills needed for supportive medications: not needed              Were doses missed due to medication being on hold? No    Cosentyx 150 mg/ml: 0 days of medicine on hand REFERRAL TO PHARMACIST     Referral to the pharmacist: Not needed      [facility]     Shipping address confirmed in Epic.     Delivery Scheduled: Yes, Expected medication delivery date: 08/29/2020.     Medication will be delivered via Same Day Courier to the prescription address in Epic [facility].    Tracy Robles Ajani Schnieders   [facility] Specialty Technician

## 2020-08-29 MED FILL — COSENTYX PEN 300 MG/2 PENS (150 MG/ML) SUBCUTANEOUS: SUBCUTANEOUS | 28 days supply | Qty: 2 | Fill #5

## 2020-09-05 NOTE — Unmapped (Addendum)
Assessment & Plan        I personally spent 24+7 (31) minutes face-to-face and non-face-to-face in the care of this patient, which includes all pre, intra, and post visit time on the date of service.       Problem List Items Addressed This Visit        Endocrine    Type 2 diabetes mellitus with hyperglycemia, without long-term current use of insulin (CMS-HCC) - Primary     Diabetes is at goal.     Medication:   - continue metformin 1500 mg daily as prescribed  -Unclear from our conversation today if Tracy Robles will continue to be a covered medication for patient.  As patient is diabetic we will try to transition her back to Ozempic as recently approved for up to 2 mg weekly.  Patient has been off semaglutide for 1.5 months.  We will have her restart at 0.5 mg weekly titrating up by 0.5 mg weekly every 4 weeks until she gets to goal dose of 2 mg weekly.  Staff messaged regarding PA  Follow-up in 3 months.    Lab Results   Component Value Date    Hemoglobin A1C 6.2 (H) 06/07/2020    Hemoglobin A1C 6.7 (A) 12/30/2019      DM Check list:  Foot Exam: 01/23/20  Retinal Exam: 01/30/20  Flu shot: 01/23/20  Pneumovax: UTD, 11/18/18, Due for Prevnar 20 based on updated guidelines. Discuss at next OV  Baseline EKG: none on file at [facility] [place] without acute ST changes noted in [facility] records 07/01/18  Microalbumin: on ARB  Lab Results   Component Value Date    Albumin/Creatinine Ratio  01/23/2020      Comment:      Unable to calculate due to value below lower limit of assay linearity.     LDL: on simvastatin 20 mg qd  Lab Results   Component Value Date    LDL Calculated 63 06/07/2020              Relevant Medications    semaglutide (OZEMPIC) 0.25 mg or 0.5 mg(2 mg/1.5 mL) PnIj injection    semaglutide (OZEMPIC) 1 mg/dose (4 mg/3 mL) PnIj injection    Other Relevant Orders    Basic Metabolic Panel (Completed)    Hemoglobin A1c (Completed)       Cardiovascular and Mediastinum    Primary hypertension     BP is not at goal per home Bps readings of 159/93.   Today's blood pressure in office: 134/82  Goal is 130/80  ??? Continue amlodipine (Norvasc) and losartan/hydrochlorothiazide (Hyzaar) as prescribed. Will consider adjusting medication in the future based on home readings, reluctant to start new medication today given reasonable control. Pt to check home BP daily x 2 week  to call/msg me with home log. It consistently elevated greater than 135/85, will add 4th agent  ??? Recommend checking blood pressure at home with arm cuff with feet flat on floor, sitting up, no talking. Advise to sit for a few minutes before measuring blood pressure.   ??? Recommend to call medical supply store for larger size blood cuff     Encouraged healthy, low-salt diet and regular exercise.   BP Readings from Last 3 Encounters:   06/07/20 128/82   03/05/20 157/96   01/23/20 146/83     Lab Results   Component Value Date    K 4.0 06/07/2020    K 4.2 01/23/2020    NA 142 06/07/2020  NA 139 01/23/2020    CREATININE 0.94 (H) 06/07/2020    CREATININE 0.91 01/23/2020                  Other    Class 3 severe obesity with body mass index (BMI) of 50.0 to 59.9 in adult (CMS-HCC)     Congratulated pt on weight loss  -Rx for higher dose ozempic as alternative to Tracy Robles given recent coverage issues    Today???s BP: 134/82. Current BMI: (!) 57.83.  Wt Readings from Last 4 Encounters:   09/06/20 (!) 145.8 kg (321 lb 8 oz)   06/07/20 (!) 147.2 kg (324 lb 8 oz)   03/05/20 (!) 154.2 kg (340 lb)   01/23/20 (!) 158.8 kg (350 lb 0.6 oz)     Goal: To lose 5-10 pounds in 3 months from the present weight: (!) 145.8 kg (321 lb 8 oz). Patient agrees to follow up with me closely for weight check/ lifestyle check.   Diet: Stop drinking soda, sweet tea, juice, and other sugary drinks (if any). Significantly decrease processed carbohydrates, desserts, or unhealthy snacks. Advised that dietician can help make appropriate manageable recommendations.  Referrals - none today.  Exercise: Advised to exercise at least 30 minutes per day (walking, treadmill, bicycling, swimming, gym). Advised Pt to start exercising 10 minutes 3 times per week and increase by 5 minutes every 2 weeks until exercising 30 minutes 3 times per week, then add 1 day.           Left shoulder pain     Patient with chronic left shoulder pain in part exacerbated from reoccurrence of itching tingling from prior shingles episode  - Can increase gabapentin to 900mg  as needed before bed to see if this helps           Cramp of both lower extremities     Suspect cramping is due to increase in activity levels causing shin splints.  - Recommend stretching calves to prevent cramping. Can ice front of shins as needed.  - Will check mg levels and BMP to rule out electrolyte deficiency as etiology  - Recommend aim for 64 oz of water daily and can also tryCalm mg supplement              Relevant Orders    Magnesium Level (Completed)    Hemoglobin A1c (Completed)           Return in about 3 months (around 12/07/2020) for Recheck, DM2, HTN.     Medication adherence and barriers to the treatment plan have been addressed. Opportunities to optimize healthy behaviors have been discussed. Patient / caregiver voiced understanding.      Subjective:     Tracy Robles is a 55 y.o. female who presents for Follow-up (Wt and cramps in the legs discuss other options other than wegovy due to insurance /)            History of Present Illness     Patient was last seen by me 06/07/20    Type 2 Diabetes Mellitus  Patient reports fasting bg levels of 120 mg/dl. Patient is currently taking metformin 1500 mg daily. She has not been taking it as regularly as prescribed due to side effect of diarrhea and lack of access to a bathroom. Patient was prescribed wegovy 2.4 mg every week but has not been receiving it due to insurance complications. She states she has friends who have been helping her with diet and exercise. She tries  to bring lunch instead of fast food and has been trying to stop snacking at 8pm.    Lab Results   Component Value Date    A1C 6.0 (H) 09/06/2020      Obesity   Patient has history of obesity. She endorses monitoring her diet and increasing activity through walking.  BMI Readings from Last 3 Encounters:   09/06/20 57.87 kg/m??   06/07/20 58.41 kg/m??   03/05/20 61.16 kg/m??     Hypertension   Patient has history of hypertension. Currently taking amlopidine 10 mg daily and hyzaar 25 mg daily. She reports she has not been limiting salt due to cramping at night.  She reports drinking >64 oz water a day. Denies lightheadedness, edema, shortness of breath, chest pain.    Cramps  Patient states she has been cramping in her lower extremities and describes them as a squeezing sensation. For this reason, she has not been limiting salt as she finds her symptoms are worse when she does. She has increased her activity recently and has been walking uphill. Drinking >64 oz water/day.    Shoulder pain   Patient reports shoulder pain has reduced since receiving injection. 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     Hemoglobin A1C (%)   Date Value   09/06/2020 6.0 (H)   06/07/2020 6.2 (H)   12/30/2019 6.7 (A)   06/21/2019 6.5 (A)   11/18/2018 6.9 (A)     LDL Calculated (mg/dL)   Date Value   16/01/9603 63   06/21/2019 75     BP Readings from Last 3 Encounters:   09/06/20 134/82   06/07/20 128/82   03/05/20 157/96     Wt Readings from Last 3 Encounters:   09/06/20 (!) 145.8 kg (321 lb 8 oz)   06/07/20 (!) 147.2 kg (324 lb 8 oz)   03/05/20 (!) 154.2 kg (340 lb)           Review of Systems     History obtained from patient and chart review     ROS negative unless otherwise stated in the HPI:            Review of History     MEDICATIONS:  Current Outpatient Medications on File Prior to Visit   Medication Sig Dispense Refill   ??? amLODIPine (NORVASC) 10 MG tablet Take 1 tablet by mouth once daily 30 tablet 0   ??? biotin 1 mg cap Take by mouth.     ??? cetirizine (ZYRTEC) 5 MG tablet Take 5 mg by mouth daily.     ??? clobetasoL (TEMOVATE) 0.05 % external solution Apply topically to scalp twice daily. 50 mL 6   ??? clobetasol (TEMOVATE) 0.05 % ointment Apply topically to rash on body twice daily. 60 g 6   ??? empty container Misc Use as directed to dispose of Cosentyx pens. 1 each 2   ??? ferrous sulfate 325 (65 FE) MG EC tablet Take by mouth.     ??? fexofenadine (ALLEGRA) 60 MG tablet Take 60 mg by mouth Two (2) times a day. Administer with water only; do not administer with fruit juices     ??? gabapentin (NEURONTIN) 300 MG capsule Take 2 capsules (600 mg total) by mouth two (2) times a day. 360 capsule 3   ??? losartan-hydrochlorothiazide (HYZAAR) 100-25 mg per tablet Take 1 tablet by mouth daily. 30 tablet 11   ???  metFORMIN (GLUCOPHAGE-XR) 500 MG 24 hr tablet Take 3 tablets (1,500 mg total) by mouth daily with evening meal. 270 tablet 3   ??? multivit with min-folic acid (MULTIVITAMIN GUMMIES) 200 mcg Chew Chew.     ??? multivitamin (TAB-A-VITE/THERAGRAN) per tablet Take 1 tablet by mouth.      ??? mupirocin (BACTROBAN) 2 % ointment APPLY OINTMENT TOPICALLY TO AFFECTED AREA TWICE DAILY FOR 7 DAYS     ??? omega-3 acid ethyl esters (LOVAZA) 1 gram capsule Take 1,000 mg by mouth.      ??? secukinumab (COSENTYX PEN, 2 PENS,) 150 mg/mL PnIj injection Inject the contents of 2 pens (300 mg total) under the skin every twenty-eight (28) days. 4 mL 11   ??? simvastatin (ZOCOR) 20 MG tablet Take 1 tablet (20 mg total) by mouth nightly. 90 tablet 3   ??? triamcinolone (KENALOG) 0.1 % cream Apply topically twice daily as needed only. 454 g 1   ??? triamcinolone (KENALOG) 0.1 % ointment Frequency:PHARMDIR   Dosage:0.0     Instructions:  Note:apply to affected areas twice daily until smooth. Dose: 0.1%     ??? vitamin E 1000 UNIT capsule Take 1,000 Units by mouth.       No current facility-administered medications on file prior to visit. ALLERGIES:      Lisinopril, Latex, and Penicillins         PAST MEDICAL HISTORY:     Past Medical History:   Diagnosis Date   ??? Diabetes mellitus (CMS-HCC)    ??? Eczema    ??? High blood pressure        PAST SURGICAL HISTORY:   No past surgical history on file.    PROBLEM LIST:     Patient Active Problem List    Diagnosis Date Noted   ??? Cramp of both lower extremities 09/06/2020   ??? Left shoulder pain 06/07/2020   ??? Ingrown toenail of right foot 11/23/2019   ??? Administrative encounter 02/10/2019   ??? Back pain 11/18/2018   ??? Encounter to establish care 11/17/2018   ??? Type 2 diabetes mellitus with hyperglycemia, without long-term current use of insulin (CMS-HCC) 02/04/2018   ??? Class 3 severe obesity with body mass index (BMI) of 50.0 to 59.9 in adult (CMS-HCC) 10/06/2016   ??? Eczema 10/06/2016   ??? Psoriasis 10/06/2016   ??? Primary hypertension 09/30/2016   ??? Sleep apnea 09/30/2016           SOCIAL HISTORY:      reports that she has never smoked. She has never used smokeless tobacco. She reports current alcohol use. She reports that she does not use drugs.   FAMILY HISTORY:     family history includes Breast cancer in her maternal aunt; Clotting disorder in her father; Diabetes in her father and sister; Hypertension in her father and mother; Kidney disease in her father; Stroke in her maternal grandmother.       Objective:    BP 134/82  - Pulse 83  - Temp 36.4 ??C (97.6 ??F) (Temporal)  - Ht 158.8 cm (5' 2.5)  - Wt (!) 145.8 kg (321 lb 8 oz)  - SpO2 98%  - BMI 57.87 kg/m??  - Body mass index is 57.87 kg/m??.    Physical exam:  General:  Well appearing, well nourished in no distress.   Skin: no obvious rash or  prominent lesions    Lymphatic: no cervical LAD. No TM  Cardiovascular:  rrr no mrg, no peripheral edema  Eyes:  conjunctiva clear, sclera non-icteric   Ears/nose/throat: no obvious external deformities  Respiratory: CTAB, breathing comfortably  Gastrointestinal: soft NTND no masses  Musculoskeletal:  Normal gait and station.  Neurologic: moves extremities symmetrically, cranial nerves grossly intact  Hematologic: no obvious ecchymosis     Psychiatric:  Oriented X3, intact judgement and insight, normal mood and affect.        No results found for this or any previous visit (from the past 24 hour(s)).     I attest that I, [place], personally documented this note while acting as a Neurosurgeon for [employer], PAC.     [employer] [place], Neurosurgeon.     The documentation recorded by the scribe accurately reflects the service I personally performed and the decisions made by me.     Gissel Keilman A Eugenia Eldredge, PAC

## 2020-09-05 NOTE — Unmapped (Addendum)
Diabetes is at goal.     Medication:   - continue metformin 1500 mg daily as prescribed  - will contact insurance about starting wegovy  - will look into higher dose ozempic as alternative if wegovy is not approved due to insurance limitations.    Follow-up in 3 months.    Lab Results   Component Value Date    Hemoglobin A1C 6.2 (H) 06/07/2020    Hemoglobin A1C 6.7 (A) 12/30/2019      ADA diet. Exercise daily 30 minutes. Dental exam annually. Foot exam daily/moisturizerdaily. Advised to follow up with his/her ophthalmologist annually.  Microalbumin:   Lab Results   Component Value Date    Albumin/Creatinine Ratio  01/23/2020      Comment:      Unable to calculate due to value below lower limit of assay linearity.     LDL:   Lab Results   Component Value Date    LDL Calculated 63 06/07/2020

## 2020-09-06 ENCOUNTER — Ambulatory Visit: Admit: 2020-09-06 | Discharge: 2020-09-07 | Payer: PRIVATE HEALTH INSURANCE | Attending: Medical | Primary: Medical

## 2020-09-06 DIAGNOSIS — M25512 Pain in left shoulder: Principal | ICD-10-CM

## 2020-09-06 DIAGNOSIS — E1165 Type 2 diabetes mellitus with hyperglycemia: Principal | ICD-10-CM

## 2020-09-06 DIAGNOSIS — R252 Cramp and spasm: Principal | ICD-10-CM

## 2020-09-06 DIAGNOSIS — Z6841 Body Mass Index (BMI) 40.0 and over, adult: Principal | ICD-10-CM

## 2020-09-06 DIAGNOSIS — I10 Essential (primary) hypertension: Principal | ICD-10-CM

## 2020-09-06 LAB — HEMOGLOBIN A1C
ESTIMATED AVERAGE GLUCOSE: 126 mg/dL
HEMOGLOBIN A1C: 6 % — ABNORMAL HIGH (ref 4.8–5.6)

## 2020-09-06 LAB — MAGNESIUM: MAGNESIUM: 2.1 mg/dL (ref 1.6–2.6)

## 2020-09-06 LAB — BASIC METABOLIC PANEL
ANION GAP: 4 mmol/L — ABNORMAL LOW (ref 5–14)
BLOOD UREA NITROGEN: 12 mg/dL (ref 9–23)
BUN / CREAT RATIO: 13
CALCIUM: 9.6 mg/dL (ref 8.7–10.4)
CHLORIDE: 107 mmol/L (ref 98–107)
CO2: 29.8 mmol/L (ref 20.0–31.0)
CREATININE: 0.94 mg/dL — ABNORMAL HIGH
EGFR CKD-EPI (2021) FEMALE: 72 mL/min/{1.73_m2} (ref >=60)
GLUCOSE RANDOM: 126 mg/dL — ABNORMAL HIGH (ref 70–99)
POTASSIUM: 4.2 mmol/L (ref 3.4–4.8)
SODIUM: 141 mmol/L (ref 135–145)

## 2020-09-06 NOTE — Unmapped (Addendum)
BP is not at goal per home Bps readings of 159/93.   Today's blood pressure in office: 134/82  Goal is 130/80  ??? Continue amlodipine (Norvasc) and losartan/hydrochlorothiazide (Hyzaar) as prescribed. Will consider adjusting medication in the future based on home readings, reluctant to start new medication today given reasonable control. Pt to check home BP daily x 2 week  to call/msg me with home log. It consistently elevated greater than 135/85, will add 4th agent  ??? Recommend checking blood pressure at home with arm cuff with feet flat on floor, sitting up, no talking. Advise to sit for a few minutes before measuring blood pressure.   ??? Recommend to call medical supply store for larger size blood cuff     Encouraged healthy, low-salt diet and regular exercise.   BP Readings from Last 3 Encounters:   06/07/20 128/82   03/05/20 157/96   01/23/20 146/83     Lab Results   Component Value Date    K 4.0 06/07/2020    K 4.2 01/23/2020    NA 142 06/07/2020    NA 139 01/23/2020    CREATININE 0.94 (H) 06/07/2020    CREATININE 0.91 01/23/2020

## 2020-09-06 NOTE — Unmapped (Addendum)
Patient with chronic left shoulder pain.  - Can increase gabapentin to 900mg  as needed before bed to help with pain.

## 2020-09-06 NOTE — Unmapped (Addendum)
Patient's weight is trending favorably.  - will contact insurance about starting wegovy  - will look into higher dose ozempic as alternative if wegovy is not approved due to insurance limitations.    Today???s  . Current BMI:  .  Wt Readings from Last 4 Encounters:   06/07/20 (!) 147.2 kg (324 lb 8 oz)   03/05/20 (!) 154.2 kg (340 lb)   01/23/20 (!) 158.8 kg (350 lb 0.6 oz)   11/22/19 (!) 158.3 kg (349 lb)     Goal: To lose 5-10 pounds in 3 months from the present weight:  . Patient agrees to follow up with me closely for weight check/ lifestyle check.   Diet: Stop drinking soda, sweet tea, juice, and other sugary drinks (if any). Significantly decrease processed carbohydrates, desserts, or unhealthy snacks. Advised that dietician can help make appropriate manageable recommendations.  Referrals - none today.  Exercise: Advised to exercise at least 30 minutes per day (walking, treadmill, bicycling, swimming, gym). Advised Pt to start exercising 10 minutes 3 times per week and increase by 5 minutes every 2 weeks until exercising 30 minutes 3 times per week, then add 1 day.

## 2020-09-06 NOTE — Unmapped (Addendum)
Suspect cramping is due to increase in activity levels causing shin splints.  - Recommend stretching calves to prevent cramping. Can ice front of shins as needed.  - Will check mg levels and BMP to rule out electrolyte deficiency as etiology  - Recommend aim for 64 oz of water daily and can also tryCalm mg supplement

## 2020-09-06 NOTE — Unmapped (Addendum)
Look at [employer] for a product called Calm        [facility] supply store:  Call to order larger blood pressure cuff  - [place] location: [phone]   - [place] location: [phone]

## 2020-09-07 MED ORDER — OZEMPIC 0.25 MG OR 0.5 MG (2 MG/1.5 ML) SUBCUTANEOUS PEN INJECTOR
SUBCUTANEOUS | 0 refills | 56 days | Status: CP
Start: 2020-09-07 — End: 2020-11-02

## 2020-09-07 MED ORDER — SEMAGLUTIDE 1 MG/DOSE (4 MG/3 ML) SUBCUTANEOUS PEN INJECTOR
SUBCUTANEOUS | 3 refills | 120.00000 days | Status: CP
Start: 2020-09-07 — End: 2021-02-09

## 2020-09-07 NOTE — Unmapped (Signed)
Received call from [facility] pharmacy requesting clarification on Ozempic or a change to [facility]. Returned call to Tracy Robles to make him aware that [facility] not covered by patient insurance.Also clarified titration schedule per provider notes. Tracy Robles thanked the office for the call.

## 2020-09-07 NOTE — Unmapped (Signed)
-----   Message from [facility], [place] sent at 09/07/2020  1:12 PM EDT -----  Regarding: PA  FYI- new PA should be coming in for Ozempic - titrating up to 2 mg dose. The indication should be diabetes only

## 2020-09-07 NOTE — Unmapped (Signed)
PA for ozempic initiated via Cover My Meds, result favorable. Medication approved 09/07/20-09/06/21

## 2020-09-18 DIAGNOSIS — I10 Essential (primary) hypertension: Principal | ICD-10-CM

## 2020-09-18 MED ORDER — AMLODIPINE 10 MG TABLET
ORAL_TABLET | 0 refills | 0 days
Start: 2020-09-18 — End: ?

## 2020-09-26 NOTE — Unmapped (Signed)
[facility] Refill Coordination Note    Specialty Medication(s) to be Shipped:   Inflammatory Disorders: Cosentyx    Other medication(s) to be shipped: No additional medications requested for fill at this time     Karrissa Parchment, DOB: 05-03-1965  Phone: [phone] (home)       All above HIPAA information was verified with patient.     Was a Nurse, learning disability used for this call? No    Completed refill call assessment today to schedule patient's medication shipment from the [facility] [phone]).  All relevant notes have been reviewed.     Specialty medication(s) and dose(s) confirmed: Regimen is correct and unchanged.   Changes to medications: Gurtha reports no changes at this time.  Changes to insurance: No  New side effects reported not previously addressed with a pharmacist or physician: None reported  Questions for the pharmacist: No    Confirmed patient received a Conservation officer, historic buildings and a Surveyor, mining with first shipment. The patient will receive a drug information handout for each medication shipped and additional FDA Medication Guides as required.       DISEASE/MEDICATION-SPECIFIC INFORMATION        For patients on injectable medications: Patient currently has 0 doses left.  Next injection is scheduled for 10/06/2020.    SPECIALTY MEDICATION ADHERENCE     Medication Adherence    Patient reported X missed doses in the last month: 0  Specialty Medication: Cosentyx 150 mg/ml  Patient is on additional specialty medications: No  Any gaps in refill history greater than 2 weeks in the last 3 months: no  Demonstrates understanding of importance of adherence: yes  Informant: patient  Reliability of informant: reliable  Confirmed plan for next specialty medication refill: delivery by pharmacy  Refills needed for supportive medications: not needed              Were doses missed due to medication being on hold? No    Cosentyx 150 mg/ml: 0 days of medicine on hand REFERRAL TO PHARMACIST     Referral to the pharmacist: Not needed      [facility]     Shipping address confirmed in Epic.     Delivery Scheduled: Yes, Expected medication delivery date: 09/28/2020.     Medication will be delivered via Same Day Courier to the prescription address in Epic WAM.    Griffin Gerrard D Breslin Hemann   [facility] Specialty Technician

## 2020-09-28 MED FILL — COSENTYX PEN 300 MG/2 PENS (150 MG/ML) SUBCUTANEOUS: SUBCUTANEOUS | 28 days supply | Qty: 2 | Fill #6

## 2020-10-18 NOTE — Unmapped (Signed)
[facility] Refill Coordination Note    Specialty Medication(s) to be Shipped:   Inflammatory Disorders: Cosentyx    Other medication(s) to be shipped: No additional medications requested for fill at this time     Tracy Robles, DOB: 10-04-1965  Phone: [phone] (home)       All above HIPAA information was verified with patient.     Was a Nurse, learning disability used for this call? No    Completed refill call assessment today to schedule patient's medication shipment from the [facility] [phone]).  All relevant notes have been reviewed.     Specialty medication(s) and dose(s) confirmed: Regimen is correct and unchanged.   Changes to medications: Tracy Robles reports no changes at this time.  Changes to insurance: No  New side effects reported not previously addressed with a pharmacist or physician: None reported  Questions for the pharmacist: No    Confirmed patient received a Conservation officer, historic buildings and a Surveyor, mining with first shipment. The patient will receive a drug information handout for each medication shipped and additional FDA Medication Guides as required.       DISEASE/MEDICATION-SPECIFIC INFORMATION        For patients on injectable medications: Patient currently has 0 doses left.  Next injection is scheduled for 11/03/2020.    SPECIALTY MEDICATION ADHERENCE     Medication Adherence    Patient reported X missed doses in the last month: 0  Specialty Medication: Cosentyx 150 mg/ml  Patient is on additional specialty medications: No  Any gaps in refill history greater than 2 weeks in the last 3 months: no  Demonstrates understanding of importance of adherence: yes  Informant: patient  Reliability of informant: reliable  Confirmed plan for next specialty medication refill: delivery by pharmacy  Refills needed for supportive medications: not needed              Were doses missed due to medication being on hold? No    Cosentyx 150 mg/ml: 0 days of medicine on hand REFERRAL TO PHARMACIST     Referral to the pharmacist: Not needed      [facility]     Shipping address confirmed in Epic.     Delivery Scheduled: Yes, Expected medication delivery date: 10/26/2020.     Medication will be delivered via Same Day Courier to the prescription address in Epic WAM.    Tracy Robles D Anisia Leija   [facility] Specialty Technician

## 2020-10-26 MED FILL — COSENTYX PEN 300 MG/2 PENS (150 MG/ML) SUBCUTANEOUS: SUBCUTANEOUS | 28 days supply | Qty: 2 | Fill #7

## 2020-11-14 ENCOUNTER — Other Ambulatory Visit: Payer: Self-pay

## 2020-11-14 ENCOUNTER — Emergency Department: Payer: BC Managed Care – PPO

## 2020-11-14 ENCOUNTER — Emergency Department
Admission: EM | Admit: 2020-11-14 | Discharge: 2020-11-14 | Disposition: A | Discharge status: Home / Self Care | Payer: BC Managed Care – PPO | Attending: Emergency Medicine | Admitting: Emergency Medicine

## 2020-11-14 DIAGNOSIS — L539 Erythematous condition, unspecified: Secondary | ICD-10-CM | POA: Diagnosis not present

## 2020-11-14 DIAGNOSIS — G4730 Sleep apnea, unspecified: Secondary | ICD-10-CM | POA: Insufficient documentation

## 2020-11-14 DIAGNOSIS — Z91040 Latex allergy status: Secondary | ICD-10-CM | POA: Diagnosis not present

## 2020-11-14 DIAGNOSIS — R079 Chest pain, unspecified: Secondary | ICD-10-CM | POA: Diagnosis present

## 2020-11-14 DIAGNOSIS — Z794 Long term (current) use of insulin: Secondary | ICD-10-CM | POA: Insufficient documentation

## 2020-11-14 DIAGNOSIS — I10 Essential (primary) hypertension: Secondary | ICD-10-CM | POA: Diagnosis not present

## 2020-11-14 DIAGNOSIS — E119 Type 2 diabetes mellitus without complications: Secondary | ICD-10-CM | POA: Insufficient documentation

## 2020-11-14 DIAGNOSIS — R109 Unspecified abdominal pain: Secondary | ICD-10-CM | POA: Diagnosis not present

## 2020-11-14 DIAGNOSIS — R000 Tachycardia, unspecified: Secondary | ICD-10-CM | POA: Insufficient documentation

## 2020-11-14 DIAGNOSIS — M25512 Pain in left shoulder: Secondary | ICD-10-CM | POA: Insufficient documentation

## 2020-11-14 DIAGNOSIS — Z7984 Long term (current) use of oral hypoglycemic drugs: Secondary | ICD-10-CM | POA: Diagnosis not present

## 2020-11-14 DIAGNOSIS — M25511 Pain in right shoulder: Secondary | ICD-10-CM | POA: Diagnosis not present

## 2020-11-14 DIAGNOSIS — Z79899 Other long term (current) drug therapy: Secondary | ICD-10-CM | POA: Diagnosis not present

## 2020-11-14 DIAGNOSIS — Y92410 Unspecified street and highway as the place of occurrence of the external cause: Secondary | ICD-10-CM | POA: Insufficient documentation

## 2020-11-14 LAB — CBC WITH DIFFERENTIAL/PLATELET
Abs Immature Granulocytes: 0.03 10*3/uL (ref 0.00–0.07)
Basophils Absolute: 0 10*3/uL (ref 0.0–0.1)
Basophils Relative: 0 %
Eosinophils Absolute: 0.2 10*3/uL (ref 0.0–0.5)
Eosinophils Relative: 2 %
HCT: 38.8 % (ref 36.0–46.0)
Hemoglobin: 12.7 g/dL (ref 12.0–15.0)
Immature Granulocytes: 0 %
Lymphocytes Relative: 23 %
Lymphs Abs: 2.2 10*3/uL (ref 0.7–4.0)
MCH: 29.2 pg (ref 26.0–34.0)
MCHC: 32.7 g/dL (ref 30.0–36.0)
MCV: 89.2 fL (ref 80.0–100.0)
Monocytes Absolute: 0.6 10*3/uL (ref 0.1–1.0)
Monocytes Relative: 7 %
Neutro Abs: 6.4 10*3/uL (ref 1.7–7.7)
Neutrophils Relative %: 68 %
Platelets: 253 10*3/uL (ref 150–400)
RBC: 4.35 MIL/uL (ref 3.87–5.11)
RDW: 14.8 % (ref 11.5–15.5)
WBC: 9.5 10*3/uL (ref 4.0–10.5)
nRBC: 0 % (ref 0.0–0.2)

## 2020-11-14 LAB — COMPREHENSIVE METABOLIC PANEL
ALT: 17 U/L (ref 0–44)
AST: 19 U/L (ref 15–41)
Albumin: 3.7 g/dL (ref 3.5–5.0)
Alkaline Phosphatase: 51 U/L (ref 38–126)
Anion gap: 8 (ref 5–15)
BUN: 15 mg/dL (ref 6–20)
CO2: 28 mmol/L (ref 22–32)
Calcium: 9.2 mg/dL (ref 8.9–10.3)
Chloride: 104 mmol/L (ref 98–111)
Creatinine, Ser: 1.04 mg/dL — ABNORMAL HIGH (ref 0.44–1.00)
GFR, Estimated: >60 mL/min (ref >60)
Glucose, Bld: 101 mg/dL — ABNORMAL HIGH (ref 70–99)
Potassium: 4.3 mmol/L (ref 3.5–5.1)
Sodium: 140 mmol/L (ref 135–145)
Total Bilirubin: 0.8 mg/dL (ref 0.3–1.2)
Total Protein: 7.3 g/dL (ref 6.5–8.1)

## 2020-11-14 MED ORDER — ACETAMINOPHEN 325 MG PO TABS
650.0000 mg | ORAL_TABLET | Freq: Once | ORAL | Status: AC
  Administered 2020-11-14: 650 mg via ORAL
  Filled 2020-11-14: qty 2

## 2020-11-14 MED ORDER — IOHEXOL 350 MG/ML SOLN
100.0000 mL | Freq: Once | INTRAVENOUS | Status: AC | PRN
  Administered 2020-11-14: 100 mL via INTRAVENOUS

## 2020-11-14 MED ORDER — IBUPROFEN 400 MG PO TABS
400.0000 mg | ORAL_TABLET | Freq: Once | ORAL | Status: AC
  Administered 2020-11-14: 400 mg via ORAL
  Filled 2020-11-14: qty 1
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
## 2020-11-14 NOTE — ED Notes (Signed)
Pt discussed with MD Erma Heritage
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
## 2020-11-16 NOTE — Unmapped (Signed)
Medication isn't due until 12/09/20 scheduling call for later date

## 2020-11-29 MED ORDER — EMPTY CONTAINER
2 refills | 0 days
Start: 2020-11-29 — End: ?

## 2020-11-29 NOTE — Unmapped (Signed)
[facility] Refill Coordination Note    Specialty Medication(s) to be Shipped:   Inflammatory Disorders: Cosentyx    Other medication(s) to be shipped: [employer], DOB: 03-Apr-1966  Phone: [phone] (home)       All above HIPAA information was verified with patient.     Was a Nurse, learning disability used for this call? No    Completed refill call assessment today to schedule patient's medication shipment from the [facility] [phone]).  All relevant notes have been reviewed.     Specialty medication(s) and dose(s) confirmed: Regimen is correct and unchanged.   Changes to medications: Tracy Robles reports no changes at this time.  Changes to insurance: No  New side effects reported not previously addressed with a pharmacist or physician: None reported  Questions for the pharmacist: No    Confirmed patient received a Conservation officer, historic buildings and a Surveyor, mining with first shipment. The patient will receive a drug information handout for each medication shipped and additional FDA Medication Guides as required.       DISEASE/MEDICATION-SPECIFIC INFORMATION        For patients on injectable medications: Patient currently has 0 doses left.  Next injection is scheduled for 12/09/2020.    SPECIALTY MEDICATION ADHERENCE     Medication Adherence    Patient reported X missed doses in the last month: 0  Specialty Medication: Cosentyx 150 mg/ml  Patient is on additional specialty medications: No  Any gaps in refill history greater than 2 weeks in the last 3 months: no  Demonstrates understanding of importance of adherence: yes  Informant: patient  Reliability of informant: reliable  Confirmed plan for next specialty medication refill: delivery by pharmacy  Refills needed for supportive medications: not needed              Were doses missed due to medication being on hold? No    Cosentyx 150 mg/ml: 0 days of medicine on hand       REFERRAL TO PHARMACIST     Referral to the pharmacist: Not needed      [facility]     Shipping address confirmed in Epic.     Delivery Scheduled: Yes, Expected medication delivery date: 12/04/2020.     Medication will be delivered via Same Day Courier to the prescription address in Epic WAM.    Tracy Robles Tracy Robles   [facility] Specialty Technician

## 2020-11-30 ENCOUNTER — Ambulatory Visit: Admit: 2020-11-30 | Discharge: 2020-12-01 | Payer: PRIVATE HEALTH INSURANCE

## 2020-11-30 MED ORDER — CEFDINIR 300 MG CAPSULE
ORAL_CAPSULE | Freq: Two times a day (BID) | ORAL | 0 refills | 7 days | Status: CP
Start: 2020-11-30 — End: 2020-12-07
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
## 2020-12-04 MED FILL — EMPTY CONTAINER: 120 days supply | Qty: 1 | Fill #0

## 2020-12-04 MED FILL — COSENTYX PEN 300 MG/2 PENS (150 MG/ML) SUBCUTANEOUS: SUBCUTANEOUS | 28 days supply | Qty: 2 | Fill #8

## 2020-12-12 ENCOUNTER — Ambulatory Visit: Admit: 2020-12-12 | Discharge: 2020-12-13 | Payer: PRIVATE HEALTH INSURANCE

## 2020-12-12 DIAGNOSIS — R350 Frequency of micturition: Principal | ICD-10-CM

## 2020-12-12 MED ORDER — NITROFURANTOIN MONOHYDRATE/MACROCRYSTALS 100 MG CAPSULE
ORAL_CAPSULE | Freq: Two times a day (BID) | ORAL | 0 refills | 7 days | Status: CP
Start: 2020-12-12 — End: 2020-12-19

## 2020-12-12 NOTE — Unmapped (Signed)
Addended by: Cheri Rous F on: 12/12/2020 12:26 PM     Modules accepted: Orders

## 2020-12-12 NOTE — Unmapped (Signed)
Assessment and Plan:     Frequent urination  -     POCT urinalysis dipstick  -     Cancel: Urine Culture; Future  -     nitrofurantoin monohyd/m-cryst; Take 1 capsule (100 mg total) by mouth Two (2) times a day for 7 days.  -     Urine Culture; Future      1. diagnosis reviewed with patient  2. Rx as per orders; reviewed risks/benefits, possible side effects and patient verbalizes understanding  3. Recommend supportive treatment with increased fluids, otc medications/analgesics prn        No follow-ups on file.    Subjective:     HPI: Tracy Robles is a 55 y.o. female here for follow up on frequent urination and dysuria. Patient seen about 10 days ago in urgent care and treated with antibiotic for 7 days. States symptoms improved, however not completely resolved. Denies any fevers, chills, abdominal pain or vomiting.     HPI       ROS:   Review of Systems     Review of systems negative unless otherwise noted as per HPI.      The following portions of the patient's history were reviewed and updated as appropriate: allergies, current medications, past family history, past medical history, past social history, past surgical history and problem list.     Objective:     Vitals:    12/12/20 1046   BP: 145/91   Pulse: 84   Temp: 36 ??C (96.8 ??F)     Body mass index is 58.46 kg/m??.    Physical Exam  Vitals and nursing note reviewed.   Constitutional:       General: She is not in acute distress.     Appearance: Normal appearance. She is not ill-appearing, toxic-appearing or diaphoretic.   Cardiovascular:      Rate and Rhythm: Normal rate.   Pulmonary:      Effort: Pulmonary effort is normal. No respiratory distress.   Neurological:      Mental Status: She is alert.                      Allergies:     Lisinopril, Latex, and Penicillins      Payton Mccallum, MD    [facility]:     Medication adherence and barriers to the treatment plan have been addressed. Opportunities to optimize healthy behaviors have been discussed. Patient / caregiver voiced understanding.

## 2020-12-14 NOTE — Unmapped (Signed)
[place] [facility] of Medications Program (CAMP)  HYPERTENSION RECRUITMENT SUMMARY NOTE     Patient was identified for CAMP Services.  A letter has been sent explaining program services.    The following quality measure gaps were identified: Hypertension Control and SDOH Screening       Education officer, community  Clinical Operations Specialist/CPhT  [place] Assessment of Medication Program (CAMP)  (P) [phone]

## 2020-12-26 NOTE — Unmapped (Signed)
[place] Assessment of Medications Program (CAMP)  HYPERTENSION RECRUITMENT SUMMARY NOTE     Patient was outreached for [employer]. Patient requests call back. wants to schedule she was just busy @ the time      The following quality measure gaps were identified: Hypertension Control and SDOH Screening         Education officer, community  Clinical Operations Specialist/CPhT  [place] Assessment of Medication Program (CAMP)  (P) [phone]

## 2020-12-27 NOTE — Unmapped (Signed)
[facility] Refill Coordination Note    Specialty Medication(s) to be Shipped:   Inflammatory Disorders: Cosentyx    Other medication(s) to be shipped: No additional medications requested for fill at this time     Tracy Robles, DOB: 03/27/1966  Phone: [phone] (home)       All above HIPAA information was verified with patient.     Was a Nurse, learning disability used for this call? No    Completed refill call assessment today to schedule patient's medication shipment from the [facility] [phone]).  All relevant notes have been reviewed.     Specialty medication(s) and dose(s) confirmed: Regimen is correct and unchanged.   Changes to medications: Tracy Robles reports no changes at this time.  Changes to insurance: No  New side effects reported not previously addressed with a pharmacist or physician: None reported  Questions for the pharmacist: No    Confirmed patient received a Conservation officer, historic buildings and a Surveyor, mining with first shipment. The patient will receive a drug information handout for each medication shipped and additional FDA Medication Guides as required.       DISEASE/MEDICATION-SPECIFIC INFORMATION        For patients on injectable medications: Patient currently has 0 doses left.  Next injection is scheduled for 01/06/2021.    SPECIALTY MEDICATION ADHERENCE     Medication Adherence    Patient reported X missed doses in the last month: 0  Specialty Medication: Cosentyx 150 mg/ml  Patient is on additional specialty medications: No  Any gaps in refill history greater than 2 weeks in the last 3 months: no  Demonstrates understanding of importance of adherence: yes  Informant: patient  Reliability of informant: reliable  Confirmed plan for next specialty medication refill: delivery by pharmacy  Refills needed for supportive medications: not needed              Were doses missed due to medication being on hold? No    Cosentyx 150 mg/ml: 0 days of medicine on hand REFERRAL TO PHARMACIST     Referral to the pharmacist: Not needed      [facility]     Shipping address confirmed in Epic.     Delivery Scheduled: Yes, Expected medication delivery date: 01/01/2021.     Medication will be delivered via Same Day Courier to the prescription address in Epic WAM.    Tracy Robles   [facility] Specialty Technician

## 2020-12-28 NOTE — Unmapped (Signed)
[place] Assessment of Medications Program (CAMP)  HYPERTENSION RECRUITMENT SUMMARY NOTE     Patient was outreached for [employer]. Unable to contact- left message. Unable to contact letter sent. Patient was unable to be contacted despite multiple attempts. If patient requires pharmacist intervention in the future, please send a referral to CAMP using the [facility] Referral to [place] Assessment of Medication Program.    The following quality measure gaps were identified: Hypertension Control and SDOH Screening     Deshanta Lady [place]  Clinical Operations Specialist/CPhT  [place] Assessment of Medication Program (CAMP)  (P) [phone] ([phone]

## 2020-12-28 NOTE — Unmapped (Signed)
Patient called into the office stating that she would like another referral placed for a sleep study. Patient states that she would prefer this sleep study to be performed in [place]- told patient I would note her request but was unsure if the [facility] hospital did sleep studies.

## 2020-12-31 DIAGNOSIS — G4730 Sleep apnea, unspecified: Principal | ICD-10-CM

## 2020-12-31 NOTE — Unmapped (Signed)
Addended by: Sonny Dandy A on: 12/31/2020 02:58 PM     Modules accepted: Orders

## 2020-12-31 NOTE — Unmapped (Signed)
Patient called into the office stating that she would like another referral placed for a sleep study. Patient states that she would prefer this sleep study to be performed in [place]- told patient I would note her request but was unsure if the [facility] hospital did sleep studies.       Patient called requesting a referral to Sleep Study.  This is an issue that has been discussed previously.   Patient has not seen this specialty previously. Patient has a specific location/provider in mind: [facility]. .   Last office visit: 12/12/2020

## 2021-01-01 MED FILL — COSENTYX PEN 300 MG/2 PENS (150 MG/ML) SUBCUTANEOUS: SUBCUTANEOUS | 28 days supply | Qty: 2 | Fill #9

## 2021-01-29 NOTE — Unmapped (Signed)
[facility] Clinical Intervention    Type of intervention: Side effect management    Medication involved: Cosentyx    Problem identified: Patient states that for the past 3 months, 2 weeks after Cosentyx dose, she was getting little bump on the back of her knee.  She injects Cosentyx it in both legs and bump shows up on back of one knee usually (not the same knee each month). It is itchy raised, not fluid filled. One time she used clobetasol cream and it scabbed over.  The next month, she used hand sanitizer and it worked but she is not sure what to do in the future.      Intervention performed: Patient has an appointment with Derm on 11/1 and will discuss with them at that time as it is difficult to make an assessment over the phone.   This is not an urgent concern so patient is willing to wait. Patient has been on Cosentyx for about 2 years and this is a new development for her.     Follow-up needed: n/a     Approximate time spent: 10-15 minutes    Clinical evidence used to support intervention: Professional judgement    Julianne Rice   [facility] Specialty Pharmacist

## 2021-01-29 NOTE — Unmapped (Signed)
[facility] Refill Coordination Note    Specialty Medication(s) to be Shipped:   Inflammatory Disorders: Cosentyx    Other medication(s) to be shipped: No additional medications requested for fill at this time     Tracy Robles, DOB: 02/14/1966  Phone: [phone] (home)       All above HIPAA information was verified with patient.     Was a Nurse, learning disability used for this call? No    Completed refill call assessment today to schedule patient's medication shipment from the [facility] [phone]).  All relevant notes have been reviewed.     Specialty medication(s) and dose(s) confirmed: Regimen is correct and unchanged.   Changes to medications: Tracy Robles reports no changes at this time.  Changes to insurance: No  New side effects reported not previously addressed with a pharmacist or physician: None reported  Questions for the pharmacist: No    Confirmed patient received a Conservation officer, historic buildings and a Surveyor, mining with first shipment. The patient will receive a drug information handout for each medication shipped and additional FDA Medication Guides as required.       DISEASE/MEDICATION-SPECIFIC INFORMATION        For patients on injectable medications: Patient currently has 0 doses left.  Next injection is scheduled for 02/08/2021.    SPECIALTY MEDICATION ADHERENCE     Medication Adherence    Patient reported X missed doses in the last month: 0  Specialty Medication: Cosentyx 150 mg/ml  Patient is on additional specialty medications: No  Any gaps in refill history greater than 2 weeks in the last 3 months: no  Demonstrates understanding of importance of adherence: yes  Informant: patient  Reliability of informant: reliable  Confirmed plan for next specialty medication refill: delivery by pharmacy  Refills needed for supportive medications: not needed              Were doses missed due to medication being on hold? No    Cosentyx 150 mg/ml: 0 days of medicine on hand REFERRAL TO PHARMACIST     Referral to the pharmacist: Not needed      [facility]     Shipping address confirmed in Epic.     Delivery Scheduled: Yes, Expected medication delivery date: 01/30/2021.     Medication will be delivered via Same Day Courier to the prescription address in Epic WAM.    Tracy Robles   [facility] Shared [facility] Specialty Technician

## 2021-01-30 MED FILL — COSENTYX PEN 300 MG/2 PENS (150 MG/ML) SUBCUTANEOUS: SUBCUTANEOUS | 28 days supply | Qty: 2 | Fill #10

## 2021-01-30 NOTE — Unmapped (Signed)
Name:  Tracy Robles  DOB: August 07, 1965  Today's Date: 02/12/2021  Age:  55 y.o.    A/P:  Problem List Items Addressed This Visit        Endocrine    Type 2 diabetes mellitus with hyperglycemia, without long-term current use of insulin (CMS-HCC) - Primary     Diabetes is at goal.     Medication:   - Continue metformin 1500 mg daily as prescribed  - Continue Ozempic 1 mg weekly x 3 weeks, then increase if tolerated to Ozempic 2 mg weekly.   - Follow-up in 3 months. Recheck A1C at next OV  - Ordered glucometer for patient today   - Referral to RD today     Lab Results   Component Value Date    Hemoglobin A1C 6.0 (H) 09/06/2020    Hemoglobin A1C 6.7 (A) 12/30/2019      DM Check list:  Foot Exam: 02/12/21  Retinal Exam: 01/30/20, scheduled for 02/19/21  Flu shot: 02/12/21  Pneumovax: 11/18/18. Prevnar20: 02/12/21  Baseline EKG: none on file at [facility], [place] without acute ST changes noted in [facility] records 07/01/18  Microalbumin: on ARB, ordered update today  Lab Results   Component Value Date    Albumin/Creatinine Ratio  01/23/2020      Comment:      Unable to calculate due to value below lower limit of assay linearity.     LDL: on simvastatin 20 mg qd  Lab Results   Component Value Date    LDL Calculated 63 06/07/2020            Relevant Medications    simvastatin (ZOCOR) 20 MG tablet    blood sugar diagnostic (GLUCOSE BLOOD) Strp    lancets Misc    blood-glucose meter kit    Other Relevant Orders    Albumin/creatinine urine ratio (Completed)    Amb Referral to Diabetes Ed/Med Nutr The       Cardiovascular and Mediastinum    Primary hypertension     BP is not at goal per home Bps readings of 140s-50s/80-90. Today's blood pressure in office: 134/88. Known hx of OSA, does not tolerate CPAP  - Continue amlodipine (Norvasc) and losartan/hydrochlorothiazide (Hyzaar) as prescribed.   - Discussed goal is Goal is 130/80.   -  Given borderline control, discussed added 4th agent, pt is in agreement. START spironolactone 25 mg daily. Standard side effects and precautions discussed  - Encouraged healthy, low-salt diet and regular exercise.   - Follow up with labs in 2-3 weeks to recheck BMP    BP Readings from Last 3 Encounters:   02/12/21 134/88   12/12/20 145/91   11/30/20 147/92     Lab Results   Component Value Date    K 4.2 09/06/2020    K 4.0 06/07/2020    NA 141 09/06/2020    NA 142 06/07/2020    CREATININE 0.94 (H) 09/06/2020    CREATININE 0.94 (H) 06/07/2020             Relevant Medications    spironolactone (ALDACTONE) 25 MG tablet    losartan-hydrochlorothiazide (HYZAAR) 100-25 mg per tablet    amLODIPine (NORVASC) 10 MG tablet    Other Relevant Orders    Basic Metabolic Panel       Other    Class 3 severe obesity with body mass index (BMI) of 50.0 to 59.9 in adult (CMS-HCC)     Congratulated pt on weight loss today  - Continue ozempic  1 mg weekly x 3 weeks, then INCREASE to Ozempic 2 mg weekly as tolerated       Today???s BP: 134/88. Current BMI:  .  Wt Readings from Last 4 Encounters:   02/12/21 (!) 144.3 kg (318 lb 1.9 oz)   12/12/20 (!) 147.4 kg (325 lb)   11/30/20 (!) 145.2 kg (320 lb)   09/06/20 (!) 145.8 kg (321 lb 8 oz)     Goal: To lose 5-10 pounds in 3 months from the present weight: (!) 144.3 kg (318 lb 1.9 oz). Patient agrees to follow up with me closely for weight check/ lifestyle check.   Diet: Stop drinking soda, sweet tea, juice, and other sugary drinks (if any). Significantly decrease processed carbohydrates, desserts, or unhealthy snacks. Advised that dietician can help make appropriate manageable recommendations.  Referrals - none today.  Exercise: Advised to exercise at least 30 minutes per day (walking, treadmill, bicycling, swimming, gym). Advised Pt to start exercising 10 minutes 3 times per week and increase by 5 minutes every 2 weeks until exercising 30 minutes 3 times per week, then add 1 day.         Relevant Orders    Amb Referral to Diabetes Ed/Med Nutr The    Back pain     Hx of recurrent LBP w and wo sciatica. Uses gabapentin 600 mg BID with relatively good control of symptoms. Referred to Spine prviously who recommend PT. Completed some virtual  PT during pandemic, but did not find this helpful. Encouraged to retry PT. Pt is agreement.   - Referred to Physical Therapy today     Lumbar X-rays 11/18/18  IMPRESSION:  -Age indeterminate grade 2 L4-5 anterolisthesis most likely due to a combination of degenerative disc disease and facet osteoarthrosis; no discrete pars interarticularis defect is seen.  -Moderate to severe L4-5 and moderate L3-4 and L5-S1 degenerative disc disease.    XR Hips Bilateral w Pelvis 3 or 4 views 11/18/18  IMPRESSION:  Moderate left and mild right hip osteoarthrosis.               Relevant Orders    AMB REFERRAL TO PHYSICAL THERAPY    Mass of left hand     Patient presents with nodule on L dorsal wrist that is bothersome. Suspect ganglion cyst. Discussed expectant vs definitve management, pt prefers exploring definitve management.   - Referred to hand specialist today          Relevant Orders    Ambulatory referral to Hand Surgery      Other Visit Diagnoses     Need for influenza vaccination        Relevant Orders    INFLUENZA INJ MDCK PF, QUAD (FLUCELVAX)(16MO AND UP EGG FREE) (Completed)    Encounter for screening mammogram for malignant neoplasm of breast        Relevant Orders    Mammo Digital Screening Bilateral        Medication adherence and barriers to the treatment plan have been addressed. Opportunities to optimize healthy behaviors have been discussed. Patient / caregiver voiced understanding.    Return in about 3 months (around 05/15/2021) for Recheck CDM.    S:  Tracy Robles is a 55 y.o. female who presents for follow up     Diabetes  Diabetes is not at goal. Metformin 1500 mg daily and Ozempic 1 mg weekly, patient took first dose of Ozempic 1 mg this past Sunday. Patient also taking simvastatin 20 mg nightly. No  home BS checked.     Hypertension   Patient with history of hypertension manages with amlodipine 10 mg daily and Hyzaar 100-25 mg daily. Blood pressure at home 140-150/80s, at triage today 134/88. Denies CP, SOB, L/D or peripheral edema.    L hand  Patient has a cyst on her L hand.     Back and Hip pain  Patient reports her hip catches and she struggles with walking. The hip pain bothers her and she is taking gabapentin. Denies saddle anesthesia, b/b dysfunction. Patient able to walk normally.     ROS:  A 12 point review of systems was negative except for pertinent items noted in the HPI.    Past medical history, family history, surgical history and social history personally reviewed and verified with patient.     PROBLEM LIST  Patient Active Problem List   Diagnosis   ??? Class 3 severe obesity with body mass index (BMI) of 50.0 to 59.9 in adult (CMS-HCC)   ??? Eczema   ??? Primary hypertension   ??? Psoriasis   ??? Sleep apnea   ??? Type 2 diabetes mellitus with hyperglycemia, without long-term current use of insulin (CMS-HCC)   ??? Encounter to establish care   ??? Back pain   ??? Administrative encounter   ??? Ingrown toenail of right foot   ??? Left shoulder pain   ??? Cramp of both lower extremities   ??? Dermatophytosis of foot   ??? Mass of left hand       O:  Vitals:    02/12/21 1332   BP: 134/88   Pulse: 94   Temp: 36.1 ??C (97 ??F)   SpO2: 98%     General:  Well appearing, well nourished in no distress.   Skin: no obvious rash or  prominent lesions    Neck: Supple, no obvious JVD  Cardiovascular:  rrr no mrg, no peripheral edema  Eyes:  conjunctiva clear, sclera non-icteric   Ears/nose/throat: no obvious external deformities  Respiratory: CTAB, breathing comfortably  Musculoskeletal:  Normal gait and station. 1 cm smooth, rubbery nodule over dorsal L wrist  Neurologic: moves extremities symmetrically, cranial nerves grossly intact  Hematologic: no obvious ecchymosis     Psychiatric:  Oriented X3, intact judgement and insight, normal mood and affect.    I attest that I, Katheran Awe Rindoks, personally documented this note while acting as scribe for [employer], PAC.      Cassidy E Rindoks, Scribe.  02/12/2021     The documentation recorded by the scribe accurately reflects the service I personally performed and the decisions made by me.    Koran Seabrook A Venie Montesinos, PAC

## 2021-01-31 DIAGNOSIS — E1165 Type 2 diabetes mellitus with hyperglycemia: Principal | ICD-10-CM

## 2021-01-31 MED ORDER — SEMAGLUTIDE 1 MG/DOSE (4 MG/3 ML) SUBCUTANEOUS PEN INJECTOR
SUBCUTANEOUS | 3 refills | 120 days | Status: CP
Start: 2021-01-31 — End: 2021-07-05

## 2021-02-08 NOTE — Unmapped (Signed)
Dermatology Note     Assessment and Plan:      Psoriasis, excellent control- <1% BSA involvement  - Given great control, will proceed as follows:  - Continue secukinumab (COSENTYX PEN, 2 PENS,) 150 mg/mL PnIj injection; Inject the contents of 2 pens (300 mg total) under the skin every twenty-eight (28) days. Refilled today.  - Stop triamcinolone 0.1% ointment due to significant irritation when treated with previously  - Continue triamcinolone (KENALOG) 0.1% cream. Apply topically to active, raised areas twice daily as needed only. We reviewed proper use and side effects of topical steroids including striae and cutaneous atrophy. Refilled today.  - Recommended Desitin cream  - Recommended Sarna Anti-Itch lotion for pruritus of the back  ??  High risk medication use (Cosentyx)  - Ordered Quant Gold today  ??  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     RTC: Return in about 1 year (around 02/12/2022) for follow up of psoriasis. or sooner as needed   _________________________________________________________________      Chief Complaint     Chief Complaint   Patient presents with   ??? Psoriasis     Follow up  Currently flaring on both legs       HPI     Tracy Robles is a 55 y.o. female who presents as a returning patient (last seen by Dr. Domenic Schwab on 03/26/2020) to [facility] for follow up of psoriasis. At last visit, patient was to continue cosentyx 300 mg every 4 weeks and continue triamcinolone 0.1% cream BID for psoriasis.    Today, patient reports pruritus of the back. She has not been treating the back with triamcinolone as when she has treated this area in the past it worsened. She also notes flare ups of the legs and intermittently of the arms and the abdomen, with no flaring of the abdomen present today. Patient particularly notes an area of psoriasis on the left dorsal arm that will not resolve.    The patient denies any other new or changing lesions or areas of concern.     Pertinent Past Medical History     No history of skin cancer  Diabetes  Psoriasis with consistent biopsy on 01/12/2018  Onychomycosis with clipping consistent from 11/09/2018    Family History:   Negative for melanoma     Social History:  She is a bus Hospital doctor for town of [place]  Husband passed away suddenly??in December 03, 2019    Past Medical History, Family History, Social History, Medication List, Allergies, and Problem List were reviewed in the rooming section of Epic.     ROS: Other than symptoms mentioned in the HPI, no fevers, chills, or other skin complaints    Physical Examination     GENERAL: Well-appearing female in no acute distress, resting comfortably.  NEURO: Alert and oriented, answers questions appropriately  PSYCH: Normal mood and affect  SKIN: Examination of the back was performed  Diffuse xerosis and clear of psoriasis    All areas not commented on are within normal limits or unremarkable    Scribe's Attestation: Abbie Sons, MD obtained and performed the history, physical  exam and medical decision making elements that were entered into the chart.  Signed by Kelle Darting, Scribe, on February 12, 2021 8:39 AM.    February 12, 2021 9:06 AM. Documentation assistance provided by the Scribe. I was present during the time the encounter was recorded. The information recorded by the Scribe was done at my direction and has been reviewed and validated by me.      (Approved Template 12/26/2019)

## 2021-02-08 NOTE — Unmapped (Signed)
-   Continue cosentyx injections, injecting 2 pens (300 mg total) every 28 days  - Continue to apply triamcinolone cream twice a day as needed to active areas

## 2021-02-12 ENCOUNTER — Ambulatory Visit: Admit: 2021-02-12 | Discharge: 2021-02-13 | Payer: PRIVATE HEALTH INSURANCE

## 2021-02-12 ENCOUNTER — Ambulatory Visit: Admit: 2021-02-12 | Discharge: 2021-02-13 | Payer: PRIVATE HEALTH INSURANCE | Attending: Medical | Primary: Medical

## 2021-02-12 DIAGNOSIS — R2232 Localized swelling, mass and lump, left upper limb: Principal | ICD-10-CM

## 2021-02-12 DIAGNOSIS — L409 Psoriasis, unspecified: Principal | ICD-10-CM

## 2021-02-12 DIAGNOSIS — M549 Dorsalgia, unspecified: Principal | ICD-10-CM

## 2021-02-12 DIAGNOSIS — Z6841 Body Mass Index (BMI) 40.0 and over, adult: Principal | ICD-10-CM

## 2021-02-12 DIAGNOSIS — Z1231 Encounter for screening mammogram for malignant neoplasm of breast: Principal | ICD-10-CM

## 2021-02-12 DIAGNOSIS — Z79899 Other long term (current) drug therapy: Principal | ICD-10-CM

## 2021-02-12 DIAGNOSIS — Z23 Encounter for immunization: Principal | ICD-10-CM

## 2021-02-12 DIAGNOSIS — E1165 Type 2 diabetes mellitus with hyperglycemia: Principal | ICD-10-CM

## 2021-02-12 DIAGNOSIS — I10 Essential (primary) hypertension: Principal | ICD-10-CM

## 2021-02-12 LAB — ALBUMIN / CREATININE URINE RATIO
ALBUMIN QUANT URINE: <0.3 mg/dL
CREATININE, URINE: 75.6 mg/dL

## 2021-02-12 MED ORDER — LOSARTAN 100 MG-HYDROCHLOROTHIAZIDE 25 MG TABLET
ORAL_TABLET | Freq: Every day | ORAL | 11 refills | 30 days | Status: CP
Start: 2021-02-12 — End: 2022-02-12

## 2021-02-12 MED ORDER — BLOOD-GLUCOSE METER KIT WRAPPER
0 refills | 0 days | Status: CP
Start: 2021-02-12 — End: 2022-02-12

## 2021-02-12 MED ORDER — SPIRONOLACTONE 25 MG TABLET
ORAL_TABLET | Freq: Every day | ORAL | 3 refills | 90 days | Status: CP
Start: 2021-02-12 — End: 2022-02-12

## 2021-02-12 MED ORDER — AMLODIPINE 10 MG TABLET
ORAL_TABLET | Freq: Every day | ORAL | 3 refills | 90 days | Status: CP
Start: 2021-02-12 — End: ?

## 2021-02-12 MED ORDER — LANCETS
3 refills | 0 days | Status: CP
Start: 2021-02-12 — End: ?

## 2021-02-12 MED ORDER — BLOOD GLUCOSE TEST STRIPS
ORAL_STRIP | 3 refills | 0.00000 days | Status: CP
Start: 2021-02-12 — End: ?

## 2021-02-12 MED ORDER — COSENTYX PEN 300 MG/2 PENS (150 MG/ML) SUBCUTANEOUS
SUBCUTANEOUS | 11 refills | 56.00000 days | Status: CP
Start: 2021-02-12 — End: ?

## 2021-02-12 MED ORDER — TRIAMCINOLONE ACETONIDE 0.1 % TOPICAL CREAM
INTRAMUSCULAR | 1 refills | 0.00000 days | Status: CP
Start: 2021-02-12 — End: ?

## 2021-02-12 MED ORDER — SIMVASTATIN 20 MG TABLET
ORAL_TABLET | Freq: Every evening | ORAL | 3 refills | 90.00000 days | Status: CP
Start: 2021-02-12 — End: ?

## 2021-02-12 NOTE — Unmapped (Addendum)
Diabetes is at goal.     Medication:   - Continue metformin 1500 mg daily as prescribed  - Continue Ozempic 1 mg weekly x 3 weeks, then increase if tolerated to Ozempic 2 mg weekly.   - Follow-up in 3 months. Recheck A1C at next OV  - Ordered glucometer for patient today   - Referral to RD today     Lab Results   Component Value Date    Hemoglobin A1C 6.0 (H) 09/06/2020    Hemoglobin A1C 6.7 (A) 12/30/2019      DM Check list:  Foot Exam: 02/12/21  Retinal Exam: 01/30/20, scheduled for 02/19/21  Flu shot: 02/12/21  Pneumovax: 11/18/18. Prevnar20: 02/12/21  Baseline EKG: none on file at [facility], [place] without acute ST changes noted in [facility] records 07/01/18  Microalbumin: on ARB, ordered update today  Lab Results   Component Value Date    Albumin/Creatinine Ratio  01/23/2020      Comment:      Unable to calculate due to value below lower limit of assay linearity.     LDL: on simvastatin 20 mg qd  Lab Results   Component Value Date    LDL Calculated 63 06/07/2020

## 2021-02-12 NOTE — Unmapped (Addendum)
BP is not at goal per home Bps readings of 140s-50s/80-90. Today's blood pressure in office: 134/88.  - Continue amlodipine (Norvasc) and losartan/hydrochlorothiazide (Hyzaar) as prescribed. Will consider adjusting medication in the future based on home readings, reluctant to start new medication today given reasonable control.   - Discussed goal is Goal is 130/80.   - TRIAL spironolactone 25 mg daily. Standard side effects and precautions discussed  - Encouraged healthy, low-salt diet and regular exercise.   - Follow up with labs in 2-3 weeks to recheck BMP    BP Readings from Last 3 Encounters:   02/12/21 134/88   12/12/20 145/91   11/30/20 147/92     Lab Results   Component Value Date    K 4.2 09/06/2020    K 4.0 06/07/2020    NA 141 09/06/2020    NA 142 06/07/2020    CREATININE 0.94 (H) 09/06/2020    CREATININE 0.94 (H) 06/07/2020

## 2021-02-12 NOTE — Unmapped (Signed)
Congratulated pt on weight loss today  - Continue ozempic 1 mg weekly x 3 weeks, then INCREASE to Ozempic 2 mg weekly as tolerated       Today???s BP: 134/88. Current BMI:  .  Wt Readings from Last 4 Encounters:   02/12/21 (!) 144.3 kg (318 lb 1.9 oz)   12/12/20 (!) 147.4 kg (325 lb)   11/30/20 (!) 145.2 kg (320 lb)   09/06/20 (!) 145.8 kg (321 lb 8 oz)     Goal: To lose 5-10 pounds in 3 months from the present weight: (!) 144.3 kg (318 lb 1.9 oz). Patient agrees to follow up with me closely for weight check/ lifestyle check.   Diet: Stop drinking soda, sweet tea, juice, and other sugary drinks (if any). Significantly decrease processed carbohydrates, desserts, or unhealthy snacks. Advised that dietician can help make appropriate manageable recommendations.  Referrals - none today.  Exercise: Advised to exercise at least 30 minutes per day (walking, treadmill, bicycling, swimming, gym). Advised Pt to start exercising 10 minutes 3 times per week and increase by 5 minutes every 2 weeks until exercising 30 minutes 3 times per week, then add 1 day.

## 2021-02-12 NOTE — Unmapped (Addendum)
[facility] Scheduling-Mammogram [phone]

## 2021-02-12 NOTE — Unmapped (Addendum)
Hx of recurrent LBP w and wo sciatica. Uses gabapentin 600 mg BID with relatively good control of symptoms. Referred to Spine last OV who recommend PT. Needs to follow up with PT. Encouraged to do this. Current symptoms do not prevent her from completing strenuous activity.   - Referred to Physical Therapy today     Lumbar X-rays 11/18/18  IMPRESSION:  -Age indeterminate grade 2 L4-5 anterolisthesis most likely due to a combination of degenerative disc disease and facet osteoarthrosis; no discrete pars interarticularis defect is seen.  -Moderate to severe L4-5 and moderate L3-4 and L5-S1 degenerative disc disease.    XR Hips Bilateral w Pelvis 3 or 4 views 11/18/18  IMPRESSION:  Moderate left and mild right hip osteoarthrosis.

## 2021-02-12 NOTE — Unmapped (Addendum)
Patient presents with nodule on L dorsal wrist that is bothersome. Suspect ganglion cyst. Discussed expectant vs definitve management, pt prefers exploring definitve management.   - Referred to hand specialist today

## 2021-02-20 DIAGNOSIS — L409 Psoriasis, unspecified: Principal | ICD-10-CM

## 2021-02-20 MED ORDER — COSENTYX PEN 300 MG/2 PENS (150 MG/ML) SUBCUTANEOUS
SUBCUTANEOUS | 11 refills | 28.00000 days | Status: CP
Start: 2021-02-20 — End: ?
  Filled 2021-03-05: qty 2, 28d supply, fill #0

## 2021-02-20 NOTE — Unmapped (Signed)
[facility] Refill Coordination Note    Specialty Medication(s) to be Shipped:   Inflammatory Disorders: Cosentyx    Other medication(s) to be shipped: No additional medications requested for fill at this time     Tracy Robles, DOB: 1966/04/09  Phone: [phone] (home)       All above HIPAA information was verified with patient.     Was a Nurse, learning disability used for this call? No    Completed refill call assessment today to schedule patient's medication shipment from the [facility] [phone]).  All relevant notes have been reviewed.     Specialty medication(s) and dose(s) confirmed: Regimen is correct and unchanged.   Changes to medications: Tracy Robles reports starting the following medications: spironolactone  Changes to insurance: No  New side effects reported not previously addressed with a pharmacist or physician: None reported  Questions for the pharmacist: No    Confirmed patient received a Conservation officer, historic buildings and a Surveyor, mining with first shipment. The patient will receive a drug information handout for each medication shipped and additional FDA Medication Guides as required.       DISEASE/MEDICATION-SPECIFIC INFORMATION        For patients on injectable medications: Patient currently has 0 doses left.  Next injection is scheduled for 03/11/21.    SPECIALTY MEDICATION ADHERENCE     Medication Adherence    Patient reported X missed doses in the last month: 0  Specialty Medication: COSENTYX  Patient is on additional specialty medications: No              Were doses missed due to medication being on hold? No    Cosentyx 150 mg/ml: 0 days of medicine on hand        REFERRAL TO PHARMACIST     Referral to the pharmacist: Not needed      [facility]     Shipping address confirmed in Epic.     Delivery Scheduled: Yes, Expected medication delivery date: 03/05/21.  However, Rx request for refills was sent to the provider as there are none remaining.     Medication will be delivered via Same Day Courier to the prescription address in Epic WAM.    Unk Lightning   [facility] Specialty Technician

## 2021-02-20 NOTE — Unmapped (Signed)
Rx request secukinumab(cosentyx pens) 150mg /ml pnij   Last OV:02/12/2021  Rx pending for your review and approval

## 2021-02-21 NOTE — Unmapped (Signed)
Rx request for Cosentyx 150mg  pen  Last OV:02/12/2021  Rx pending for your review and approval

## 2021-03-12 ENCOUNTER — Ambulatory Visit: Admit: 2021-03-12 | Discharge: 2021-03-12 | Payer: PRIVATE HEALTH INSURANCE

## 2021-03-19 ENCOUNTER — Ambulatory Visit
Admit: 2021-03-19 | Payer: PRIVATE HEALTH INSURANCE | Attending: Rehabilitative and Restorative Service Providers" | Primary: Rehabilitative and Restorative Service Providers"

## 2021-03-19 ENCOUNTER — Ambulatory Visit
Admit: 2021-03-19 | Discharge: 2021-03-20 | Payer: PRIVATE HEALTH INSURANCE | Attending: Orthopaedic Surgery | Primary: Orthopaedic Surgery

## 2021-03-19 NOTE — Unmapped (Signed)
ORTHOPAEDIC NEW CLINIC NOTE     ASSESSMENT:  Tracy Robles is a 55 y.o. female with:  ??? Bilateral carpal tunnel syndrome left greater than right  ??? Left hand dorsal mass, consistent with extensor tendinitis mass over the midline dorsal wrist    PLAN:  We had a good discussion today with the patient regarding symptoms, diagnosis, treatment options.   Patient presents to our clinic today with a mass over her left wrist that is consistent with an extensor tendinitis mass.  This is been bothering her for approximately 2 years.  Additionally the patient has bilateral numbness and tingling in the radial 3 digits consistent with carpal tunnel syndrome, left greater than right.  Discussed treatment options.  We recommend at this time EMGs for evaluation of bilateral carpal tunnel syndrome, bilateral cock-up wrist splints in the interim to be worn at night.  I would also recommend [place] to evaluate the dorsal wrist mass.  We will follow-up with the patient after EMGs and [place] to discuss carpal tunnel release and mass excision.    DME ORDER:  Dx: R22.32, G56.03, Mass of left hand, Bilateral carpal tunnel syndrome  Dispense at Surgery: No  Location: [facility]  Body Location: Hand/Wrist/Elbow  Hand/Wrist/Elbow Orthotics: Wrist Cock-Up- 8  Laterality: Bilateral  Print for Patient: No    Ganglion cyst, dorsal L hand. Pt desires definitive management, DME Upper Extremity, The patient was prescribed this orthosis to be used on upper extremity for the purpose of immobilization.    SUBJECTIVE:  Chief Complaint: Bilateral hand pain.    History of Present Illness:   Tracy Robles is a 55 y.o. female who presents for evaluation of her bilateral hands.  Patient presents to clinic for evaluation of a left dorsal hand mass.  She has had this for approximately 2 years over the dorsal midline wrist.  This is tender to palpation.  She has pain with wrist extension and contact with her watch.  Additionally she has numbness and tingling in bilateral hands most notably in the radial 3 digits.  This is more noticeable on the left than the right.  She has not tried any splinting.  She has nighttime symptoms occasionally but most of symptoms are with Toniann Ket.  She denies any inciting trauma.  Of note she does have a history of a left wrist ganglion that was aspirated and injected over the volar aspect of the wrist.      Medical History   Past Medical History:   Diagnosis Date   ??? Arthritis 2011    Lower back   ??? Diabetes mellitus (CMS-HCC)    ??? Eczema    ??? High blood pressure         Surgical History   No past surgical history on file.   Medications   Current Outpatient Medications   Medication Sig Dispense Refill   ??? amLODIPine (NORVASC) 10 MG tablet Take 1 tablet (10 mg total) by mouth daily. 90 tablet 3   ??? biotin 1 mg cap Take by mouth.     ??? blood sugar diagnostic (GLUCOSE BLOOD) Strp Check blood sugar as directed once a day and for symptoms of high or low blood sugar. 100 strip 3   ??? blood-glucose meter kit Use as instructed 1 each 0   ??? cetirizine (ZYRTEC) 5 MG tablet Take 5 mg by mouth once as needed.     ??? clobetasoL (TEMOVATE) 0.05 % external solution Apply topically to scalp twice daily. 50 mL 6   ???  clobetasol (TEMOVATE) 0.05 % ointment Apply topically to rash on body twice daily. 60 g 6   ??? empty container Misc Use as directed to dispose of Cosentyx pens. 1 each 2   ??? empty container Misc Use as directed 1 each 2   ??? ferrous sulfate 325 (65 FE) MG EC tablet Take by mouth.     ??? fexofenadine (ALLEGRA) 60 MG tablet Take 60 mg by mouth Two (2) times a day. Administer with water only; do not administer with fruit juices     ??? gabapentin (NEURONTIN) 300 MG capsule Take 2 capsules (600 mg total) by mouth two (2) times a day. 360 capsule 3   ??? lancets Misc Check blood sugar as directed once a day and for symptoms of high or low blood sugar. 100 each 3   ??? losartan-hydrochlorothiazide (HYZAAR) 100-25 mg per tablet Take 1 tablet by mouth daily. 30 tablet 11   ??? metFORMIN (GLUCOPHAGE-XR) 500 MG 24 hr tablet Take 3 tablets (1,500 mg total) by mouth daily with evening meal. 270 tablet 3   ??? multivit with min-folic acid (MULTIVITAMIN GUMMIES) 200 mcg Chew Chew.     ??? multivitamin (TAB-A-VITE/THERAGRAN) per tablet Take 1 tablet by mouth.      ??? mupirocin (BACTROBAN) 2 % ointment APPLY OINTMENT TOPICALLY TO AFFECTED AREA TWICE DAILY FOR 7 DAYS     ??? omega-3 acid ethyl esters (LOVAZA) 1 gram capsule Take 1,000 mg by mouth.     ??? secukinumab (COSENTYX PEN, 2 PENS,) 150 mg/mL PnIj injection Inject 2 mL (300 mg total) under the skin every twenty-eight (28) days. 2 mL 11   ??? semaglutide (OZEMPIC) 1 mg/dose (4 mg/3 mL) PnIj injection Inject 1 mg under the skin every seven (7) days for 28 days, THEN 1.5 mg every seven (7) days for 28 days, THEN 2 mg every seven (7) days. 21 mL 3   ??? simvastatin (ZOCOR) 20 MG tablet Take 1 tablet (20 mg total) by mouth nightly. 90 tablet 3   ??? spironolactone (ALDACTONE) 25 MG tablet Take 1 tablet (25 mg total) by mouth daily. 90 tablet 3   ??? triamcinolone (KENALOG) 0.1 % cream Apply topically twice daily as needed only. 454 g 1   ??? triamcinolone (KENALOG) 0.1 % ointment Frequency:PHARMDIR   Dosage:0.0     Instructions:  Note:apply to affected areas twice daily until smooth. Dose: 0.1%     ??? vitamin E 1000 UNIT capsule Take 1,000 Units by mouth.       No current facility-administered medications for this visit.      Allergies   Lisinopril, Latex, and Penicillins     Social History Social History     Socioeconomic History   ??? Marital status: Widowed     Spouse name: None   ??? Number of children: None   ??? Years of education: None   ??? Highest education level: None   Tobacco Use   ??? Smoking status: Never   ??? Smokeless tobacco: Never   Vaping Use   ??? Vaping Use: Never used   Substance and Sexual Activity   ??? Alcohol use: Yes     Alcohol/week: 1.0 standard drink     Types: 1 Cans of beer per week     Comment: Only on Saturday   ??? Drug use: Never ??? Sexual activity: Not Currently     Partners: Male     Birth control/protection: None   Other Topics Concern   ??? Do you use  sunscreen? No   ??? Tanning bed use? No   ??? Are you easily burned? No   ??? Excessive sun exposure? No   ??? Blistering sunburns? No          Family History The patient's family history includes Arthritis in her mother; Asthma in her father; Breast cancer in her maternal aunt; Clotting disorder in her father; Diabetes in her father and sister; Hypertension in her father and mother; Kidney disease in her father; Miscarriages / Stillbirths in her maternal grandmother and sister; Stroke in her maternal grandmother..         Review of Systems Patient denies fevers, chills       Physical Exam:  General  Well nourished, appearing stated age   Appropriate affect and mood    Musculoskeletal  Bilateral Upper Extremity:  Skin  -appears pink and well perfused with no evidence of trauma.   ROM  -active ROM fingers, wrist, elbow is painless.   Inspection/Palpation  -The left wrist has a small 1 cm mass over the dorsal midline wrist that moves with digit range of motion consistent with an extensor tendonitis mass   No wasting of the thenar or interossei muscles appreciated  Motor/Strength  -Wrist extension, wrist flexion, IO, grip strength 5/5  APB and opponens strength is 5/5  Sensory  -median nerve distribution with diminished sensation to light touch   Stability  - No gross instability noted at the elbow, wrist, or fingers  Vascular  -fingers are warm, with brisk capillary refill  Provocative testing  -Carpal tunnel Tinel's positive   Durkin's compressive testing positive at the carpal tunnel      Test Results  Imaging was independently reviewed by Dr. Jarold Motto and myself.  No new imaging indicated.      Problem List  Active Problems:    * No active hospital problems. *

## 2021-03-19 NOTE — Unmapped (Signed)
Please contact radiology scheduling [phone] option 1 to schedule your ultrasound.
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
## 2021-03-28 NOTE — Unmapped (Signed)
[facility] Refill Coordination Note    Specialty Medication(s) to be Shipped:   Inflammatory Disorders: Cosentyx    Other medication(s) to be shipped: No additional medications requested for fill at this time     Tracy Robles, DOB: Jun 09, 1965  Phone: [phone] (home)       All above HIPAA information was verified with patient.     Was a Nurse, learning disability used for this call? No    Completed refill call assessment today to schedule patient's medication shipment from the [facility] [phone]).  All relevant notes have been reviewed.     Specialty medication(s) and dose(s) confirmed: Regimen is correct and unchanged.   Changes to medications: Evaleigh reports no changes at this time.  Changes to insurance: No  New side effects reported not previously addressed with a pharmacist or physician: None reported  Questions for the pharmacist: No    Confirmed patient received a Conservation officer, historic buildings and a Surveyor, mining with first shipment. The patient will receive a drug information handout for each medication shipped and additional FDA Medication Guides as required.       DISEASE/MEDICATION-SPECIFIC INFORMATION        For patients on injectable medications: Patient currently has 0 doses left.  Next injection is scheduled for 12/28.    SPECIALTY MEDICATION ADHERENCE     Medication Adherence    Patient reported X missed doses in the last month: 0  Specialty Medication: cosentyx 150mg /ml  Patient is on additional specialty medications: No  Patient is on more than two specialty medications: No  Any gaps in refill history greater than 2 weeks in the last 3 months: no  Demonstrates understanding of importance of adherence: yes  Informant: patient  Reliability of informant: reliable  Provider-estimated medication adherence level: good  Patient is at risk for Non-Adherence: No  Reasons for non-adherence: no problems identified  Confirmed plan for next specialty medication refill: delivery by pharmacy  Refills needed for supportive medications: not needed          Refill Coordination    Has the Patients' Contact Information Changed: No  Is the Shipping Address Different: No         Were doses missed due to medication being on hold? No    cosentyx 150 mg/ml: 0 days of medicine on hand         REFERRAL TO PHARMACIST     Referral to the pharmacist: Not needed      [facility]     Shipping address confirmed in Epic.     Delivery Scheduled: Yes, Expected medication delivery date: 12/21.     Medication will be delivered via UPS to the prescription address in Epic WAM.    Antonietta Barcelona   [facility] Specialty Technician

## 2021-04-02 MED FILL — COSENTYX PEN 300 MG/2 PENS (150 MG/ML) SUBCUTANEOUS: SUBCUTANEOUS | 28 days supply | Qty: 2 | Fill #1

## 2021-04-02 NOTE — Unmapped (Addendum)
Pt contacted the office stating that she needs a letter from [facility] saying she's not able to tolerate the cpap machine. She stated that she went to the dentist and was told that she would need this letter. Patient would like a follow up call.

## 2021-04-03 NOTE — Unmapped (Signed)
Called unable to leave message

## 2021-04-03 NOTE — Unmapped (Signed)
Letter completed and sent to pt. Please contact pt to see if wet signed copy also needed.

## 2021-04-09 ENCOUNTER — Ambulatory Visit: Admit: 2021-04-09 | Discharge: 2021-04-10 | Payer: PRIVATE HEALTH INSURANCE

## 2021-04-09 ENCOUNTER — Ambulatory Visit: Payer: BC Managed Care – PPO | Admitting: Podiatry

## 2021-04-09 ENCOUNTER — Other Ambulatory Visit: Payer: Self-pay

## 2021-04-09 DIAGNOSIS — R2232 Localized swelling, mass and lump, left upper limb: Principal | ICD-10-CM

## 2021-04-09 DIAGNOSIS — M79671 Pain in right foot: Secondary | ICD-10-CM

## 2021-04-09 DIAGNOSIS — S93401S Sprain of unspecified ligament of right ankle, sequela: Secondary | ICD-10-CM

## 2021-04-10 NOTE — Progress Notes (Signed)
Subjective:  Patient ID: Julia Snow, female    DOB: [birth year],  MRN: [id number]  Chief Complaint  Patient presents with   Nail Problem    Bilateral nail on big toe causing pain and discomfort     55 y.o. female presents with the above complaint.  Patient presents with a new complaint of right mild ankle sprain.  She states it hurts on the right ankle has been going for few weeks has progressed to gotten worse.  She states that she may have twisted the ankle little bit.  However the pain is mild in nature.  She wanted get it evaluated make sure that there is nothing concerning.  She denies any other acute complaints.  She would like to discuss treatment options.  She does not wear any kind of bracing.   Review of Systems: Negative except as noted in the HPI. Denies N/V/F/Ch.  Past Medical History:  Diagnosis Date   Allergy    Hypertension    Miscarriage 2005   Sleep apnea    does not wear CPAP    Current Outpatient Medications:    amLODipine (NORVASC) 10 MG tablet, Take 1 tablet (10 mg total) by mouth daily., Disp: 90 tablet, Rfl: 1   Biotin 78295 MCG TABS, Take by mouth., Disp: , Rfl:    cetirizine (ZYRTEC) 5 MG tablet, Take by mouth., Disp: , Rfl:    ciclopirox (PENLAC) 8 % solution, Apply topically at bedtime. Apply over nail and surrounding skin. Apply daily over previous coat. After seven (7) days, may remove with alcohol and continue cycle., Disp: 6.6 mL, Rfl: 2   clobetasol (TEMOVATE) 0.05 % external solution, Apply topically to scalp twice daily., Disp: , Rfl:    ferrous sulfate 325 (65 FE) MG EC tablet, Take 325 mg by mouth 3 (three) times daily with meals., Disp: , Rfl:    fexofenadine (ALLEGRA) 60 MG tablet, Take by mouth., Disp: , Rfl:    gabapentin (NEURONTIN) 300 MG capsule, Take 2 capsules (600 mg total) by mouth daily as needed (shingles pain)., Disp: 30 capsule, Rfl: 1   hydrochlorothiazide (HYDRODIURIL) 25 MG tablet, Take 1 tablet (25 mg total) by mouth daily.,  Disp: 90 tablet, Rfl: 1   losartan (COZAAR) 100 MG tablet, Take 1 tablet (100 mg total) by mouth daily., Disp: 90 tablet, Rfl: 1   losartan-hydrochlorothiazide (HYZAAR) 100-25 MG tablet, Take 1 tablet by mouth daily., Disp: , Rfl:    metFORMIN (GLUCOPHAGE-XR) 500 MG 24 hr tablet, Take 500 mg by mouth 3 (three) times daily., Disp: , Rfl:    Multiple Vitamin (MULTIVITAMIN) tablet, Take 1 tablet by mouth daily., Disp: , Rfl:    mupirocin ointment (BACTROBAN) 2 %, APPLY OINTMENT TOPICALLY TO AFFECTED AREA TWICE DAILY FOR 7 DAYS, Disp: , Rfl:    Secukinumab 150 MG/ML SOAJ, INJECT THE CONTENTS OF 2 PENS (300 MG) ONCE WEEKLY AT WEEKS 0, 1, 2, 3, AND 4. THEN INJECT 2 PENS (300 MG) EVERY 4 WEEKS, Disp: , Rfl:    Semaglutide,0.25 or 0.5MG /DOS, (OZEMPIC, 0.25 OR 0.5 MG/DOSE,) 2 MG/1.5ML SOPN, , Disp: , Rfl:    Semaglutide-Weight Management (WEGOVY) 1 MG/0.5ML SOAJ, Inject into the skin., Disp: , Rfl:    simvastatin (ZOCOR) 20 MG tablet, Take 1 tablet (20 mg total) by mouth at bedtime., Disp: 90 tablet, Rfl: 1   triamcinolone cream (KENALOG) 0.1 %, Apply topically 2 (two) times daily as needed., Disp: , Rfl:    triamcinolone cream (KENALOG) 0.5 %, Apply 1  application topically 3 (three) times daily., Disp: 454 g, Rfl: 5   vitamin E 1000 UNIT capsule, Take 1,000 Units by mouth daily., Disp: , Rfl:   Social History   Tobacco Use  Smoking Status Never  Smokeless Tobacco Never    Allergies  Allergen Reactions   Lisinopril Cough   Penicillins    Latex Rash    Other reaction(s): UNKNOWN Other reaction(s): UNKNOWN Other reaction(s): UNKNOWN    Objective:  There were no vitals filed for this visit. There is no height or weight on file to calculate BMI. Constitutional Well developed. Well nourished.  Vascular Dorsalis pedis pulses palpable bilaterally. Posterior tibial pulses palpable bilaterally. Capillary refill normal to all digits.  No cyanosis or clubbing noted. Pedal hair growth normal.   Neurologic Normal speech. Oriented to person, place, and time. Epicritic sensation to light touch grossly present bilaterally.  Dermatologic Nails well groomed and normal in appearance. No open wounds. No skin lesions.  Orthopedic: Mild pain on palpation to the right ankle.  Negative anterior drawer test or talar tilt test.  Mild pain with plantarflexion inversion of the foot.  No pain with dorsiflexion eversion of the foot.  No deep intra-articular ankle pain noted.   Radiographs: None Assessment:   1. Mild ankle sprain, right, sequela    Plan:  Patient was evaluated and treated and all questions answered.  Right mild ankle sprain -All questions and concerns were discussed with the patient in extensive detail -Given that her pain is only mild I believe she will benefit from a Tri-Lock ankle brace to give her stability and allow allow the soft tissue to heal appropriately.  If it continues to worsen I discussed that she may benefit from cam boot immobilization at that time.  She states understanding -Tri-Lock ankle brace was dispensed  No follow-ups on file.

## 2021-04-16 ENCOUNTER — Ambulatory Visit: Admit: 2021-04-16 | Discharge: 2021-04-17 | Payer: PRIVATE HEALTH INSURANCE

## 2021-04-18 NOTE — Unmapped (Addendum)
Called patient to discuss her ultrasound results, per Dr. Jarold Motto. Pt verbalized understanding. Pt would like to schedule her EMG and follow up regarding the cyst and her carpal tunnel symptoms after EMG. Provided pt with contact info for EMG Scheduling, as well as Dr. Norval Gable office. Pt had no further questions or concerns.    ----- Message from [facility], MD sent at 04/18/2021 12:29 PM EST -----  Regarding: [place] results  Can you let her know that the [place] confirms that she has a cyst on her wrist which appears to be coming from her tendons as we suspected.  We could get her set up to have that aspirated by nonop sports under US guidance if she'd like.  We had also recommended that she get EMG done to eval for CTS though I don't see those scheduled yet.  If she wants to wait until those are done to act on her cyst that's fine too.

## 2021-04-24 DIAGNOSIS — R202 Paresthesia of skin: Principal | ICD-10-CM

## 2021-04-24 DIAGNOSIS — R200 Anesthesia of skin: Principal | ICD-10-CM

## 2021-04-24 NOTE — Unmapped (Signed)
[facility] Refill Coordination Note    Specialty Medication(s) to be Shipped:   Inflammatory Disorders: Cosentyx    Other medication(s) to be shipped: No additional medications requested for fill at this time     Tracy Robles, DOB: 1965/12/12  Phone: [phone] (home)       All above HIPAA information was verified with patient.     Was a Nurse, learning disability used for this call? No    Completed refill call assessment today to schedule patient's medication shipment from the [facility] [phone]).  All relevant notes have been reviewed.     Specialty medication(s) and dose(s) confirmed: Regimen is correct and unchanged.   Changes to medications: Tracy Robles reports no changes at this time.  Changes to insurance: No  New side effects reported not previously addressed with a pharmacist or physician: None reported  Questions for the pharmacist: No    Confirmed patient received a Conservation officer, historic buildings and a Surveyor, mining with first shipment. The patient will receive a drug information handout for each medication shipped and additional FDA Medication Guides as required.       DISEASE/MEDICATION-SPECIFIC INFORMATION        For patients on injectable medications: Patient currently has 0 doses left.  Next injection is scheduled for 05/11/2021.    SPECIALTY MEDICATION ADHERENCE     Medication Adherence    Patient reported X missed doses in the last month: 0  Specialty Medication: Cosentyx  Patient is on additional specialty medications: No  Any gaps in refill history greater than 2 weeks in the last 3 months: no  Demonstrates understanding of importance of adherence: yes  Informant: patient  Reliability of informant: reliable  Confirmed plan for next specialty medication refill: delivery by pharmacy  Refills needed for supportive medications: not needed              Were doses missed due to medication being on hold? No    cosentyx 150 mg/ml: 0 days of medicine on hand         REFERRAL TO PHARMACIST     Referral to the pharmacist: Not needed      [facility]     Shipping address confirmed in Epic.     Delivery Scheduled: Yes, Expected medication delivery date: 05/03/2021.     Medication will be delivered via Same Day Courier to the prescription address in Epic WAM.    Tracy Robles   [facility] Specialty Technician

## 2021-04-25 NOTE — Unmapped (Signed)
Tracy Robles looks like per most recent mammogram Left breast diagnostic imaging is recommended for a 2-view finding.

## 2021-05-03 MED FILL — COSENTYX PEN 300 MG/2 PENS (150 MG/ML) SUBCUTANEOUS: SUBCUTANEOUS | 28 days supply | Qty: 2 | Fill #2

## 2021-05-08 NOTE — Unmapped (Signed)
Name:  Tracy Robles  DOB: Mar 17, 1966  Today's Date: 05/21/2021  Age:  56 y.o.    A/P:  Problem List Items Addressed This Visit        Endocrine    Type 2 diabetes mellitus with hyperglycemia, without long-term current use of insulin (CMS-HCC) - Primary     Diabetes is at goal. Today's A1C 5.7,  Decreased from last OV 6.0. 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     Medication:   - DECREASE metformin 1500 mg daily --> 1000 mg daily. If she cannot tolerate this decrease to 500 mg  - Plan to STOP Ozempic 2 mg weekly and TRIAL Mounjaro 2.5 weekly x 4 weeks, THEN INCREASE to 5 mg weekly x 4 weeks. If no weight loss seen at this dose will plan to INCREASE to 7.5 mg weekly. Discussed coupon program  - Patient can continue Ozempic 1 mg weekly while waiting for [facility] PA  - Follow-up in 3 months. Recheck A1C at next OV    Lab Results   Component Value Date    Hemoglobin A1C 5.7 05/21/2021      DM Check list:  Foot Exam: 02/12/21  Retinal Exam: 01/30/20, DUE  Flu shot: 02/12/21  Pneumovax: 11/18/18. Prevnar20: 02/12/21  Baseline EKG: none on file at [facility], [place] without acute ST changes noted in [facility] records 07/01/18  Microalbumin: on ARB  Lab Results   Component Value Date    Albumin/Creatinine Ratio  02/12/2021      Comment:      Unable to calculate.     LDL: on simvastatin 20 mg qd  Lab Results   Component Value Date    LDL Calculated 63 06/07/2020            Relevant Medications    tirzepatide (MOUNJARO) 2.5 mg/0.5 mL PnIj    tirzepatide (MOUNJARO) 5 mg/0.5 mL PnIj    metFORMIN (GLUCOPHAGE-XR) 500 MG 24 hr tablet    Other Relevant Orders    POCT glycosylated hemoglobin (Hb A1C) (Completed)       Cardiovascular and Mediastinum    Primary hypertension     BP is not at goal per home Bps, borderline controlled based on today's blood pressure in office: 128/84. Known hx of OSA, does not tolerate CPAP  - Continue amlodipine (Norvasc) and losartan/hydrochlorothiazide (Hyzaar) and spironolactone 25 mg daily (started at last OV) as prescribed.   - Discussed goal is Goal is 130/80.   - Encouraged healthy, low-salt diet and regular exercise.   - follow up in 3 months    BP Readings from Last 3 Encounters:   05/21/21 128/84   02/12/21 134/88   12/12/20 145/91     Lab Results   Component Value Date    K 3.8 05/21/2021    K 4.2 09/06/2020    NA 142 05/21/2021    NA 141 09/06/2020    CREATININE 0.95 (H) 05/21/2021    CREATININE 0.94 (H) 09/06/2020                Other    Class 3 severe obesity with body mass index (BMI) of 50.0 to 59.9 in adult (CMS-HCC)     See DM tab- starting Mounjaro             Medication adherence and barriers to the treatment plan  have been addressed. Opportunities to optimize healthy behaviors have been discussed. Patient / caregiver voiced understanding.    Return in about 3 months (around 08/18/2021) for Recheck.    S:  Tracy Robles is a 56 y.o. female who presents for CDM. She is taking AZO for dysuria although this symptoms has improved. She is also staying well hydrated and drinking cranberry juice.      Type 2 diabetes mellitus  Patient takes metformin 1500 mg daily (on days she isn't working), Ozempic 2 mg weekly x 2 months, Today's A1C 5.7. She has not noticed any decrease in weight.     Hypertension   Patient with history of hypertension manages with amlodipine 10 mg daily, Hyzaar 100-25 mg daily, started spironolactone 25 mg daily at last OV. Blood pressure at home unreported, at triage today 128/84. Denies CP, SOB, L/D or peripheral edema.    R hand cyst  Referred to [facility], hand surgery for cyst. [place] confirms that she has a cyst on her wrist which appears to be coming from her tendons as we suspected.  We could get her set up to have that aspirated by nonop sports under US guidance if she'd like Patient has EMG planned for 06/2021    ROS:  A 12 point review of systems was negative except for pertinent items noted in the HPI.    Past medical history, family history, surgical history and social history personally reviewed and verified with patient.     PROBLEM LIST  Patient Active Problem List   Diagnosis   ??? Class 3 severe obesity with body mass index (BMI) of 50.0 to 59.9 in adult (CMS-HCC)   ??? Eczema   ??? Primary hypertension   ??? Psoriasis   ??? Sleep apnea   ??? Type 2 diabetes mellitus with hyperglycemia, without long-term current use of insulin (CMS-HCC)   ??? Encounter to establish care   ??? Back pain   ??? Administrative encounter   ??? Ingrown toenail of right foot   ??? Left shoulder pain   ??? Cramp of both lower extremities   ??? Dermatophytosis of foot   ??? Mass of left hand       O:  Vitals:    05/21/21 1359   BP: 128/84   Pulse: 93   Temp: 36.5 ??C (97.7 ??F)   SpO2: 98%     General:  Well appearing, pleasant female in no distress.   Skin: no obvious rash or  prominent lesions    Neck: Supple, no obvious JVD  Cardiovascular:  rrr no mrg, no peripheral edema  Eyes:  conjunctiva clear, sclera non-icteric   Ears/nose/throat: no obvious external deformities  Respiratory: CTAB, breathing comfortably  Musculoskeletal:  Normal gait and station.  Neurologic: moves extremities symmetrically, cranial nerves grossly intact  Hematologic: no obvious ecchymosis     Psychiatric:  Oriented X3, intact judgement and insight, normal mood and affect.    I attest that I, Katheran Awe Rindoks, personally documented this note while acting as scribe for [employer], PAC.      Cassidy E Rindoks, Scribe.  05/21/2021     The documentation recorded by the scribe accurately reflects the service I personally performed and the decisions made by me.    Shirleyann Montero A Fatoumata Albaugh, PAC

## 2021-05-21 ENCOUNTER — Ambulatory Visit: Admit: 2021-05-21 | Discharge: 2021-05-21 | Payer: PRIVATE HEALTH INSURANCE

## 2021-05-21 ENCOUNTER — Ambulatory Visit: Admit: 2021-05-21 | Discharge: 2021-05-21 | Payer: PRIVATE HEALTH INSURANCE | Attending: Medical | Primary: Medical

## 2021-05-21 DIAGNOSIS — I10 Essential (primary) hypertension: Principal | ICD-10-CM

## 2021-05-21 DIAGNOSIS — Z6841 Body Mass Index (BMI) 40.0 and over, adult: Principal | ICD-10-CM

## 2021-05-21 DIAGNOSIS — E1165 Type 2 diabetes mellitus with hyperglycemia: Principal | ICD-10-CM

## 2021-05-21 LAB — BASIC METABOLIC PANEL
ANION GAP: 6 mmol/L (ref 5–14)
BLOOD UREA NITROGEN: 14 mg/dL (ref 9–23)
BUN / CREAT RATIO: 15
CALCIUM: 10 mg/dL (ref 8.7–10.4)
CHLORIDE: 105 mmol/L (ref 98–107)
CO2: 30.7 mmol/L (ref 20.0–31.0)
CREATININE: 0.95 mg/dL — ABNORMAL HIGH
EGFR CKD-EPI (2021) FEMALE: 71 mL/min/{1.73_m2} (ref >=60)
GLUCOSE RANDOM: 140 mg/dL (ref 70–179)
POTASSIUM: 3.8 mmol/L (ref 3.4–4.8)
SODIUM: 142 mmol/L (ref 135–145)

## 2021-05-21 MED ORDER — MOUNJARO 5 MG/0.5 ML SUBCUTANEOUS PEN INJECTOR
SUBCUTANEOUS | 1 refills | 0.00000 days | Status: CP
Start: 2021-05-21 — End: ?

## 2021-05-21 MED ORDER — MOUNJARO 2.5 MG/0.5 ML SUBCUTANEOUS PEN INJECTOR
SUBCUTANEOUS | 0 refills | 0.00000 days | Status: CP
Start: 2021-05-21 — End: ?

## 2021-05-21 MED ORDER — METFORMIN ER 500 MG TABLET,EXTENDED RELEASE 24 HR
ORAL_TABLET | Freq: Every day | ORAL | 3 refills | 135.00000 days | Status: CP
Start: 2021-05-21 — End: ?

## 2021-05-21 NOTE — Unmapped (Addendum)
Diabetes is at goal. Today's A1C 5.7,  Decreased from last OV 6.0. 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     Medication:   - DECREASE metformin 1500 mg daily --> 1000 mg daily. If she cannot tolerate this decrease to 500 mg  - Plan to STOP Ozempic 2 mg weekly and TRIAL Mounjaro 2.5 weekly x 4 weeks, THEN INCREASE to 5 mg weekly x 4 weeks. If no weight loss seen at this dose will plan to INCREASE to 7.5 mg weekly. Discussed coupon program  - Patient can continue Ozempic 1 mg weekly while waiting for [facility] PA  - Follow-up in 3 months. Recheck A1C at next OV    Lab Results   Component Value Date    Hemoglobin A1C 5.7 05/21/2021      DM Check list:  Foot Exam: 02/12/21  Retinal Exam: 01/30/20, DUE  Flu shot: 02/12/21  Pneumovax: 11/18/18. Prevnar20: 02/12/21  Baseline EKG: none on file at [facility], [place] without acute ST changes noted in [facility] records 07/01/18  Microalbumin: on ARB  Lab Results   Component Value Date    Albumin/Creatinine Ratio  02/12/2021      Comment:      Unable to calculate.     LDL: on simvastatin 20 mg qd  Lab Results   Component Value Date    LDL Calculated 63 06/07/2020

## 2021-05-21 NOTE — Unmapped (Signed)
BP is not at goal per home Bps readings higher than at triage Today's blood pressure in office: 128/84. Known hx of OSA, does not tolerate CPAP  - Continue amlodipine (Norvasc) and losartan/hydrochlorothiazide (Hyzaar) and spironolactone 25 mg daily (started at last OV) as prescribed.   - Discussed goal is Goal is 130/80.   - Encouraged healthy, low-salt diet and regular exercise.   - follow up in 3 months    BP Readings from Last 3 Encounters:   05/21/21 128/84   02/12/21 134/88   12/12/20 145/91     Lab Results   Component Value Date    K 3.8 05/21/2021    K 4.2 09/06/2020    NA 142 05/21/2021    NA 141 09/06/2020    CREATININE 0.95 (H) 05/21/2021    CREATININE 0.94 (H) 09/06/2020

## 2021-05-23 LAB — QUANTIFERON TB GOLD PLUS
QUANTIFERON ANTIGEN 1 MINUS NIL: 0.07 [IU]/mL
QUANTIFERON ANTIGEN 2 MINUS NIL: 0 [IU]/mL
QUANTIFERON MITOGEN: 9.96 [IU]/mL
QUANTIFERON TB GOLD PLUS: NEGATIVE
QUANTIFERON TB NIL VALUE: 0.04 [IU]/mL

## 2021-05-23 LAB — TB AG1: TB AG1 VALUE: 0.11

## 2021-05-23 LAB — TB MITOGEN: TB MITOGEN VALUE: 10

## 2021-05-23 LAB — TB AG2: TB AG2 VALUE: 0.04

## 2021-05-23 LAB — TB NIL: TB NIL VALUE: 0.04

## 2021-05-24 DIAGNOSIS — E1165 Type 2 diabetes mellitus with hyperglycemia: Principal | ICD-10-CM

## 2021-05-28 ENCOUNTER — Ambulatory Visit
Admit: 2021-05-28 | Discharge: 2021-05-29 | Payer: PRIVATE HEALTH INSURANCE | Attending: Physician Assistant | Primary: Physician Assistant

## 2021-05-28 DIAGNOSIS — G4733 Obstructive sleep apnea (adult) (pediatric): Principal | ICD-10-CM

## 2021-05-28 DIAGNOSIS — Z72820 Sleep deprivation: Principal | ICD-10-CM

## 2021-05-28 DIAGNOSIS — E1165 Type 2 diabetes mellitus with hyperglycemia: Principal | ICD-10-CM

## 2021-05-28 DIAGNOSIS — I10 Essential (primary) hypertension: Principal | ICD-10-CM

## 2021-05-28 DIAGNOSIS — Z6841 Body Mass Index (BMI) 40.0 and over, adult: Principal | ICD-10-CM

## 2021-05-28 NOTE — Unmapped (Signed)
Driving: YOU MUST GET MORE SLEEP!!  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  This is dangerous and puts you as well as your passengers at risk.  Extend your sleep. 6.5-7 hours at the very least.    Sleep apnea: Use your mandibular advancement device every time that you are to go to sleep.  As it typically takes 4-6 months to be adequately treated, I will have you re-evaluated afterwards.  I will need to repeat a sleep study with use of your mandibular advancement device to determine its effectiveness.    Patient education: Sleep apnea in adults (Beyond the Basics)  Author:  Truitt Leep, MD  Section Editor:  Eliezer Lofts, MD  Deputy Editor:  Gwen Her, MD  Contributor Disclosures  All topics are updated as new evidence becomes available and our peer review process is complete.  Literature review current through: Oct 2021. - This topic last updated: Oct 20, 2019.    INTRODUCTION -- Normally during sleep, air moves in and out through the nose, throat, and lungs at a regular rhythm. In a person with sleep apnea, air movement is periodically reduced or stopped. There are two types of sleep apnea, obstructive sleep apnea and central (non-obstructive) sleep apnea; some forms of apnea involve both types (a ???mixed??? apnea). For both types of sleep apnea, there is a reduction in breathing. In obstructive sleep apnea, breathing becomes abnormal because of narrowing or closure of the throat. In central sleep apnea, breathing is abnormal because of a change in the breathing control and rhythm, but the throat remains open.  Sleep apnea is a serious condition that can affect sleep satisfaction and quality, alertness and efficiency while awake, and the ability to safely drive a motor vehicle; it can also impact long term health. Approximately 25 percent of adults are at risk for sleep apnea of some degree [1]. Males are more commonly affected than females, but after menopause it is more equal. Other risk factors include middle and older age, being overweight or obese, and having a small mouth and throat.  This topic review focuses on the most common type of sleep apnea in adults, obstructive sleep apnea (OSA).  HOW SLEEP APNEA OCCURS -- The throat is surrounded by muscles that control the airway for speaking, swallowing, and breathing. These muscles hang from the skull and jaw and surround a flexible tube (the main airway that brings air to the lungs). During sleep, muscles are less active, which can cause the throat to narrow (figure 1). In most people, this narrowing does not affect breathing. In others, it can cause snoring, sometimes with reduced or completely blocked airflow (figure 2). A completely blocked airway without airflow is called an obstructive apnea. Partial obstruction with diminished airflow is called a hypopnea. A person may have both sleep apnea and hypopnea.  Insufficient breathing due to apnea or hypopnea causes oxygen levels to fall and carbon dioxide levels to rise. Because the airway is blocked, breathing faster or harder does not help to improve oxygen levels until the airway is reopened. Typically, the obstruction requires the person to briefly awaken to activate upper airway muscles. Once the airway is opened, the person then takes several deep breaths to catch up on breathing. As the person awakens, he or she may move briefly, snort, or loudly snore. Less frequently, a person may awaken completely with a sensation of gasping, smothering, or choking.  If the person falls back  to sleep quickly, he or she will not remember the event. Many people with sleep apnea are unaware of their abnormal breathing in sleep, and all patients underestimate how often their sleep is interrupted. Awakening from sleep causes sleep to be unrefreshing and causes a sense of fatigue and wake time sleepiness.  Anatomic causes of obstructive sleep apnea -- Some patients have OSA because of a small upper airway. As the bones of the face and skull develop, some people develop a small lower face, a small mouth, and a tongue that seems too large for the mouth. These features are largely genetically determined, which explains why OSA tends to cluster in some families. Obesity also increases the risk of airway closure. Tonsil enlargement can be an important cause, especially in children. While these factors increase the risk of sleep apnea, they are not likely to cause noticeable symptoms or problems while the person is awake.  SLEEP APNEA SYMPTOMS -- The major symptoms of OSA are loud snoring, fatigue, and feeling sleepy during the day (or whenever the person is normally awake). However, some people have no symptoms. For example, if the person does not have a bed partner, they may not be aware of the snoring. Fatigue and sleepiness have many causes and are often attributed to overwork and increasing age. As a result, it may take time for a person to recognize that they have a problem. A bed partner or spouse often prompts the person to seek medical attention (eg, for pauses, snorts, and snoring during sleep).  Other symptoms may include one or more of the following:  ?Restless sleep  ?Awakening with choking, gasping, or smothering  ?Morning headaches, dry mouth, or sore throat  ?Waking frequently to urinate  ?Awakening unrested, groggy  ?Low energy, difficulty concentrating, memory impairment  Risk factors -- Certain factors increase the risk of sleep apnea.  ?Increasing age - OSA occurs at all ages, but it is more common in middle and older age adults.  ?Female sex and hormones - OSA is twice as common in males as females, especially in middle aged males and in those on replacement hormones.  ?Obesity - The more obese a person is, the more likely he or she is to have OSA.  ?Sedation from medication or alcohol - These reduce breathing and prevent awakening during sleep, and can lengthen periods of apnea (no breathing), with potentially dangerous consequences.  ?Abnormality of the airway that narrows it (eg, large tonsils).    SLEEP APNEA HEALTH CONSEQUENCES -- Complications of sleep apnea can include reduced alertness, difficulty concentrating, and sleepiness. The consequence is an increased risk of crashes, accidents, and errors. Studies have shown that people with severe OSA are more than twice as likely to be involved in a motor vehicle accident as people without sleep apnea. People with OSA are encouraged to recognize this risk and discuss options for driving, working, and performing other high-risk tasks with a healthcare provider.  In addition, people with untreated OSA may have an increased risk or worsened control of cardiovascular problems such as high blood pressure, heart attack, abnormal heart rhythms, or stroke [2]. This risk may be due to changes in the heart rate and blood pressure that occur during sleep.    SLEEP APNEA DIAGNOSIS -- The diagnosis of OSA and a plan to manage it is best made by a knowledgeable sleep medicine specialist who has an understanding of the individual's health issues. The diagnosis is usually based upon the person's medical history, physical examination,  and testing, including:  ?A complaint of snoring and ineffective sleep  ?Neck size (greater than 17 inches in men or 16 inches in women) is associated with an increased risk of sleep apnea  ?A small upper airway: difficulty seeing the throat because of a tongue that is large for the mouth  ?High blood pressure, especially if it is resistant to treatment  ?If a bed partner has observed the patient during episodes of stopped breathing (apnea), choking, or gasping during sleep, there is a strong possibility of sleep apnea  An overnight sleep study is called a polysomnogram. The polysomnogram measures the breathing effort and airflow, blood oxygen level, heart rate and rhythm, duration of the various stages of sleep, body position, and movement of the arms/legs.  At-home devices are available that monitor breathing, oxygen saturation, position, and heart rate, but not sleep itself. Home monitoring is a reasonable alternative to conventional testing in a sleep laboratory if the clinician strongly suspects moderate or severe sleep apnea and the patient does not have other illnesses or sleep disorders that may interfere with interpretation of the results.    SLEEP APNEA TREATMENT -- The goal of treatment is to maintain an open airway during sleep. Effective treatment will eliminate the symptoms of sleep disturbance; long-term health consequences are also reduced. Most treatments require nightly use. The challenge for the clinician and the patient is to select an effective therapy that is appropriate for the patient's problem and that is acceptable for long term use.    Continuous positive airway pressure (CPAP) -- The most effective predictable, and commonly used treatment for sleep apnea uses air pressure from a mechanical device to keep the upper airway open during sleep. A CPAP device (figure 3) uses an air-tight attachment to the nose, typically a mask, connected to a tube and a blower which generates the pressure [3]. Devices should fit comfortably into the nasal opening, or over the nose or nose and mouth. CPAP should be used any time the person sleeps (day or night).  The CPAP device can be started in the sleep lab, where a technician can adjust the pressure and select the best equipment to keep the airway open. Alternatively, an auto device with a self-adjusting pressure feature, provided with proper education and training, can get treatment started without another sleep test. CPAP devices are now relatively quiet, and having a comfortable mask fit is key, but most people will accept the treatment if it improves their symptoms. However, difficulty with mask comfort and/or nasal congestion result in a reduction of regular use to only 50 percent of people after two years.    Continued follow-up with a healthcare provider helps promote effective treatment as technology improves. Information from the CPAP machine is often used by you, physicians, therapists, and insurers to track the success of treatment. CPAP can be delivered with different features to improve comfort and solve problems that may come up during treatment. Changes in treatment may be needed if symptoms do not improve or if the person's condition changes, such as a gain or loss of weight.    Behavior and lifestyle changes -- Most people with OSA can benefit from certain behavior changes.    Changing sleep position -- Adjusting sleep position (to stay off the back) may help improve sleep quality in people who have OSA when sleeping on the back. However, this is difficult to maintain throughout the night and is rarely an adequate solution.    Weight loss -- Weight loss  is very helpful for people who are obese or overweight. Weight loss through dietary changes, exercise, and/or surgical treatment is equally effective. However, it can be difficult to maintain weight loss; the five-year success of non-surgical weight loss is only 5 percent, meaning that 95 percent of people regain lost weight. (See Patient education: Losing weight (Beyond the Basics).)    Avoiding alcohol and other sedatives -- Alcohol can worsen sleepiness, increasing the risk of accidents or injury. People with OSA are often counseled to drink little to no alcohol, even during the daytime. Similarly, people who take anti-anxiety medications or sedatives to sleep should speak with their healthcare provider about the impact of these medications on sleep apnea.  If you have OSA, you will need to notify other healthcare providers, including surgeons, about your condition and the potential risks of being sedated. People with OSA who are given perioperative anesthesia and/or pain medications require special management and close monitoring to reduce the risk of a blocked airway.    Other treatments -- While behavioral changes and CPAP are typically recommended as initial therapy for people with OSA, other treatments may be used in some situations.     Appliances -- An oral appliance (or mandibular advancement device) can reposition the jaw, bringing the tongue and soft palate forward to relieve obstruction in some people [4].  Oral appliances work very well to reduce snoring, although the effect on OSA is sometimes more limited [4]. As a result, they are best used for mild cases of OSA when relief of snoring is the main goal. While oral appliances are not as effective as CPAP for OSA, they may be an alternative for people who cannot tolerate (or choose not to have) CPAP. Side effects of oral appliances are generally minor but may include changes to the bite with prolonged use.    Other devices that aim to reduce snoring and improve sleep are also available over the counter or by prescription. These include strips that are placed over the nose or nostrils with the goal of helping keep the airway open. While some people find these devices helpful, there is limited evidence for their efficacy in treating sleep apnea. If you are interested in trying one of these devices, be sure to read all labels and instructions carefully, and discuss it your health care provider first.    Upper airway surgery -- Surgery is an alternative for patients who cannot tolerate or do not improve with nonsurgical treatments. Surgery can also be used in combination with other nonsurgical treatments.    Surgical procedures reshape structures in the upper airways or surgically reposition bone or soft tissue. Uvulopalatopharyngoplasty (UPPP) removes the uvula and excessive tissue in the throat, including the tonsils, if present. Other procedures, such as maxillomandibular advancement (MMA), address both the upper and lower pharyngeal airway more globally.  UPPP alone has limited success rates (less than 50 percent) and people can relapse (when OSA symptoms return after surgery) [5]. As a result, this surgery is only recommended in a minority of people and should be considered with caution. MMA may have a higher success rate, particularly in people with abnormal jaw (maxilla and mandible) anatomy, but it is a complicated procedure.     Tracheostomy creates a permanent opening in the neck. It is reserved for people with severe disease in whom less drastic measures have failed or are inappropriate. Although successful in eliminating obstructive sleep apnea, tracheostomy requires significant lifestyle changes and carries some serious risks (eg, infection, bleeding,  blockage).  There is increasing experience with a surgical approach that involves nerve stimulation to prevent the upper airway from closing during sleep. While there are specific criteria a person must meet in order to be a candidate for this procedure, it can sometimes help if other treatments have not been effective.   All surgical treatments require discussions about the goals of treatment, the expected outcomes, and potential complications.

## 2021-05-28 NOTE — Unmapped (Signed)
Patient called in stating she saw provider Tracy Robles (Neurology) for New Sleep appointment. Patients states provider suggested to have bariatric surgery. Patient would like her PCP to review office visit notes and also like advice on his suggestion. Writer advised patient to make an appointment , patient declined.    Please advise.

## 2021-05-28 NOTE — Unmapped (Signed)
CLINIC NOTE   Sleep Clinic, Neurology  [facility]      Date of Service: 05/28/2021     Patient Name: Tracy Robles       MRN: [id number]       Date of Birth: 10-Jan-1966  Primary Care Physician: Filomena Jungling, [facility]  Referring Provider: Tester, Hillary Aspen, *    Assessment and Plan:   Impression:   Tracy Robles is a 56 y.o. female with history of DM, HTN, arthritis, eczema, morbid obesity and was referred secondary to her diagnosis of sleep apnea.    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  This is dangerous and she was counseled that she must extend her sleep.  She cannot get behind the wheel with so little sleep as it puts her self as well as others in danger.    Moderate obstructive sleep apnea: Tracy Robles states that she was initially diagnosed with sleep apnea via a split study in [place], in 2000.  She does not have a copy of her sleep study and while CPAP was recommended, she used it for about a month.  She did use a fullface mask but could not tolerate it.  She felt difficulty having something on her face but also notes difficulty breathing with her mask.  She did not have anyone managing her sleep apnea and returned her device.  She may have simply needed a full night PAP titration study and/or a mask change.  In either case, she was not willing to be treated with CPAP.  She did have a sleep study repeated 03/13/2021.  Which revealed an overall moderate obstructive sleep apnea with an AHI of 22.7 /HR. I spoke at length with regards to the pathophysiology of both central and obstructive sleep apnea.  I also discussed that untreated sleep apnea is a independent risk factor for cardiovascular disease including but not limited to, hypertension, arrhythmias, MI, stroke, increased mortality.  I also discussed her sleep study with her at length. I further discussed treatment options for sleep apnea including positional therapy, oral appliance, surgery, and Pap therapy.  As she has been unwilling to use CPAP, she had already been seen by a dentist, Dr.  Aline Robles who fit her for a temporary mandibular advancement device in December and she is due to receive her permanent, on Friday.  I discussed with her that she would need to use her MAD whenever she is going to sleep.  She will also need to follow-up with me in 4-5 months, at completion of her treatment.  She will need to have a follow-up sleep study with her dental appliance in place, to determine its effectiveness.  All questions were answered.  ?? Follow-up appointment in 4-5 months  ?? Will need PSG with dental appliance, after completion of therapy  ?? Will reach out to patient's dentist for recent notes.    I personally spent 70 minutes face-to-face and non-face-to-face in the care of this patient, which includes all pre, intra, and post visit time on the date of service.      Subjective:     Chief Complaint: Sleep apnea    History of Present Illness:     Tracy Robles is a 56 y.o. female seen for sleep apnea at the request of  Tester, [address], * for an opinion regarding potential etiologies, diagnostic work-up that should be considered, and/or recommendations for  treatment options.      Tracy Robles was referred secondary to her diagnosis of sleep.  She was noted to have risk factors for sleep apnea and had a split study in [place] in 2000.  We do not have those records and she does not have a copy.  She was found to have sleep apnea and CPAP was recommended and ordered.  She does not recall which DME she received her device from.  She does recall having a fullface mask and not being able to tolerate it.  She felt that she could not tolerate anything on her face.  She does recall not being able to breathe with the CPAP and return to the device in about a month.  She has been untreated and was noted to still have sleep apnea risk factors.  This was primarily loud snoring, hypertension and morbid obesity.  She denies any known pauses in her sleep breathing, sleep-related headaches, grinding of her teeth/clenching of her jaw, sleep-related hallucinations, sleep paralysis, dream reenactment nor any RLS symptoms.  She denies any daytime somnolence.  She primarily sleeps on her belly and never sleeps on her back.  She had a PSG 03/13/2021 revealing moderate obstructive sleep apnea with an AHI 22.7 /HR.  She was seen by a dentist, a Dr. Aline Robles who evaluated and fitted her with a mandibular advancement device.  She received a temporary in December and is due to receive her permanent on Friday.  She currently works as a Midwife.  This affects her sleep time.  She goes to bed about 10 PM and it does not take her long to fall asleep.  She watches television and falls asleep with the device on.  She wakes up once a night to urinate but wakes up between 2 AM and 4 AM.  She wakes up at this time as she has to get ready for work.  She is not refreshed and will at times take a 30-40-minute nap in the afternoon.  She states that she does not do this often and denies any sleepiness.  She usually does not doze off.  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      Review of Systems:  A 14-system review was completed and found to be negative except for what is mentioned in the HPI.     PMH:  Past Medical History:   Diagnosis Date   ??? Arthritis 2011    Lower back   ??? Diabetes mellitus (CMS-HCC)    ??? Eczema    ??? High blood pressure        Past Surgical Hx:  No past surgical history on file.      FamHx:  Family History   Problem Relation Age of Onset   ??? Diabetes Father    ??? Hypertension Father    ??? Kidney disease Father    ??? Clotting disorder Father         Blood clot   ??? Asthma Father    ??? Diabetes Sister    ??? Miscarriages / Stillbirths Sister    ??? Hypertension Mother    ??? Arthritis Mother    ??? Breast cancer Maternal Aunt    ??? Stroke Maternal Grandmother    ??? Miscarriages / Stillbirths Maternal Grandmother    ??? Melanoma Neg Hx    ??? Basal cell carcinoma Neg Hx    ??? Squamous cell carcinoma Neg Hx    ??? Ovarian cancer Neg Hx  Social History:  Social History     Socioeconomic History   ??? Marital status: Widowed   Tobacco Use   ??? Smoking status: Never   ??? Smokeless tobacco: Never   Vaping Use   ??? Vaping Use: Never used   Substance and Sexual Activity   ??? Alcohol use: Yes     Alcohol/week: 1.0 standard drink     Types: 1 Cans of beer per week     Comment: Only on Saturday   ??? Drug use: Never   ??? Sexual activity: Not Currently     Partners: Male     Birth control/protection: None   Other Topics Concern   ??? Do you use sunscreen? No   ??? Tanning bed use? No   ??? Are you easily burned? No   ??? Excessive sun exposure? No   ??? Blistering sunburns? No         Medications:    Current Outpatient Medications:   ???  amLODIPine (NORVASC) 10 MG tablet, Take 1 tablet (10 mg total) by mouth daily., Disp: 90 tablet, Rfl: 3  ???  biotin 1 mg cap, Take by mouth., Disp: , Rfl:   ???  cetirizine (ZYRTEC) 5 MG tablet, Take 5 mg by mouth once as needed., Disp: , Rfl:   ???  clobetasoL (TEMOVATE) 0.05 % external solution, Apply topically to scalp twice daily., Disp: 50 mL, Rfl: 6  ???  clobetasol (TEMOVATE) 0.05 % ointment, Apply topically to rash on body twice daily., Disp: 60 g, Rfl: 6  ???  gabapentin (NEURONTIN) 300 MG capsule, Take 2 capsules (600 mg total) by mouth two (2) times a day., Disp: 360 capsule, Rfl: 3  ???  losartan-hydrochlorothiazide (HYZAAR) 100-25 mg per tablet, Take 1 tablet by mouth daily., Disp: 30 tablet, Rfl: 11  ???  metFORMIN (GLUCOPHAGE-XR) 500 MG 24 hr tablet, Take 2 tablets (1,000 mg total) by mouth daily with evening meal., Disp: 270 tablet, Rfl: 3  ???  multivit with min-folic acid (MULTIVITAMIN GUMMIES) 200 mcg Chew, Chew., Disp: , Rfl:   ???  secukinumab (COSENTYX PEN, 2 PENS,) 150 mg/mL PnIj injection, Inject 2 mL (300 mg total) under the skin every twenty-eight (28) days., Disp: 2 mL, Rfl: 11  ??? simvastatin (ZOCOR) 20 MG tablet, Take 1 tablet (20 mg total) by mouth nightly., Disp: 90 tablet, Rfl: 3  ???  spironolactone (ALDACTONE) 25 MG tablet, Take 1 tablet (25 mg total) by mouth daily., Disp: 90 tablet, Rfl: 3  ???  triamcinolone (KENALOG) 0.1 % cream, Apply topically twice daily as needed only., Disp: 454 g, Rfl: 1  ???  blood sugar diagnostic (GLUCOSE BLOOD) Strp, Check blood sugar as directed once a day and for symptoms of high or low blood sugar., Disp: 100 strip, Rfl: 3  ???  blood-glucose meter kit, Use as instructed, Disp: 1 each, Rfl: 0  ???  ONETOUCH DELICA PLUS LANCET 33 gauge Misc, CHECK BLOOD SUGAR AS DIRECTED ONCE A DAY AND FOR SYMPTOMS OF HIGH OR LOW BLOOD SUGAR, Disp: , Rfl:   ???  tirzepatide (MOUNJARO) 2.5 mg/0.5 mL PnIj, Inject 2.5 mg under the skin every seven (7) days. (Patient not taking: Reported on 05/28/2021), Disp: 2 mL, Rfl: 0  ???  tirzepatide (MOUNJARO) 5 mg/0.5 mL PnIj, Inject 5 mg under the skin every seven (7) days., Disp: 2 mL, Rfl: 1      Allergies:  Allergies   Allergen Reactions   ??? Lisinopril Cough   ??? Latex  Other reaction(s): UNKNOWN   ??? Penicillins      Other reaction(s): UNSPECIFIED            Objective:     Physical Exam:     Vitals:    05/28/21 1004   BP: 123/75   BP Site: R Arm   BP Position: Sitting   BP Cuff Size: X-Large   Pulse: 79   Resp: 18   Weight: (!) 150.7 kg (332 lb 5 oz)   Height: 158.8 cm (5' 2.5)     Body mass index is 59.81 kg/m??.      Awake alert appropriate  EOMI  Tongue midline  Face symmetric  Clear speech  Chest: CTA, no wheeze  CVS: S1-S2 RRR  Follows commands  Moves all extremities spontaneously and antigravity  No pronator drift  No dysmetria on finger-to-nose  Normal gait    Epworth Sleepiness Scale: 3      Data/ Lab/ Inventory Review:   Polysomnogram results as above  Blood/ serum laboratory tests  No results found for: FERRITIN  No results found for: LABIRON  No results found for: TSH  No results found for: FREET4  No results found for: VITAMINB12  CO2   Date Value Ref Range Status   05/21/2021 30.7 20.0 - 31.0 mmol/L Final           Author:  Patrcia Dolly 05/28/2021 11:07 AM    Note - This record has been created using [employer]. Chart creation errors have been sought, but may not always have been located. Such creation errors do not reflect on the standard of medical care.

## 2021-05-30 NOTE — Unmapped (Signed)
See DM tab- starting [facility]

## 2021-05-31 ENCOUNTER — Ambulatory Visit: Admit: 2021-05-31 | Discharge: 2021-06-01 | Payer: PRIVATE HEALTH INSURANCE

## 2021-05-31 DIAGNOSIS — R928 Other abnormal and inconclusive findings on diagnostic imaging of breast: Principal | ICD-10-CM

## 2021-06-06 NOTE — Unmapped (Signed)
[facility] Refill Coordination Note    Specialty Medication(s) to be Shipped:   Inflammatory Disorders: Cosentyx    Other medication(s) to be shipped: No additional medications requested for fill at this time     Tracy Robles, DOB: June 27, 1965  Phone: [phone] (home)       All above HIPAA information was verified with patient.     Was a Nurse, learning disability used for this call? No    Completed refill call assessment today to schedule patient's medication shipment from the [facility] [phone]).  All relevant notes have been reviewed.     Specialty medication(s) and dose(s) confirmed: Regimen is correct and unchanged.   Changes to medications: [place] reports no changes at this time.  Changes to insurance: No  New side effects reported not previously addressed with a pharmacist or physician: None reported  Questions for the pharmacist: No    Confirmed patient received a Conservation officer, historic buildings and a Surveyor, mining with first shipment. The patient will receive a drug information handout for each medication shipped and additional FDA Medication Guides as required.       DISEASE/MEDICATION-SPECIFIC INFORMATION        For patients on injectable medications: Patient currently has 0 doses left.  Next injection is scheduled for 06/11/2021.    SPECIALTY MEDICATION ADHERENCE     Medication Adherence    Patient reported X missed doses in the last month: 0  Specialty Medication: Cosentyx  Patient is on additional specialty medications: No  Any gaps in refill history greater than 2 weeks in the last 3 months: no  Demonstrates understanding of importance of adherence: yes  Informant: patient  Reliability of informant: reliable  Confirmed plan for next specialty medication refill: delivery by pharmacy  Refills needed for supportive medications: not needed              Were doses missed due to medication being on hold? No    cosentyx 150 mg/ml: 0 days of medicine on hand         REFERRAL TO PHARMACIST     Referral to the pharmacist: Not needed      [facility]     Shipping address confirmed in Epic.     Delivery Scheduled: Yes, Expected medication delivery date: 06/07/2021.     Medication will be delivered via Same Day Courier to the prescription address in Epic WAM.    Abygail Galeno D Aibhlinn Kalmar   [facility] Specialty Technician

## 2021-06-07 MED FILL — COSENTYX PEN 300 MG/2 PENS (150 MG/ML) SUBCUTANEOUS: SUBCUTANEOUS | 28 days supply | Qty: 2 | Fill #3

## 2021-06-09 DIAGNOSIS — I10 Essential (primary) hypertension: Principal | ICD-10-CM

## 2021-06-09 MED ORDER — LOSARTAN 100 MG-HYDROCHLOROTHIAZIDE 25 MG TABLET
ORAL_TABLET | 0 refills | 0.00000 days
Start: 2021-06-09 — End: ?

## 2021-06-10 MED ORDER — LOSARTAN 100 MG-HYDROCHLOROTHIAZIDE 25 MG TABLET
ORAL_TABLET | ORAL | 0 refills | 0.00000 days | Status: CP
Start: 2021-06-10 — End: ?

## 2021-06-14 ENCOUNTER — Ambulatory Visit
Admit: 2021-06-14 | Discharge: 2021-06-15 | Payer: PRIVATE HEALTH INSURANCE | Attending: Physical Medicine & Rehabilitation | Primary: Physical Medicine & Rehabilitation

## 2021-06-14 NOTE — Unmapped (Signed)
[facility]  Clinical Neurophysiology Laboratory  [place], [place]         Patient: Tracy Robles Date of Birth: 1965/12/12  [facility] #: [id number] Handedness: Right  Sex: Female      Visit Date: 06/14/2021 3:08 PM  Age: 56 Years  Attending: Valeda Malm, MD   Req Provider: Earleen Newport, MD   EMG Tech: Francee Gentile, CNCT   Current Height: 5 feet 2 inch    History:  Tracy Robles presents for left hand paresthesias in all digits. Bilateral EDX was ordered but she chooses to test only the left side. She has neck pain but not radicular type pain. On exam she has a negative tinels at the elbow. She has full strength otherwise.      Motor NCS      Nerve / Sites Muscle Latency Ref. Amplitude Ref. Dur. Distance Lat Diff Velocity Ref.     ms ms mV mV ms mm ms m/s m/s   L Median - APB      Wrist APB 3.92 ?4.40 8.1 ?4.2 6.65 8         Elbow APB 8.25  7.6  6.90 23.5 4.33 54.2 ?49.0   L Ulnar - ADM      Wrist ADM 2.90 ?3.50 4.3 ?5.6 6.56 8         B.Elbow ADM 6.02  3.9  6.33 20 3.13 64.0 ?49.0      A.Elbow ADM 7.83  4.0  6.67 12 1.81 66.2 ?49.0       F  Wave      Nerve F min Ref.    ms ms   L Median - APB 26.0 ?31.0   L Ulnar - ADM 25.1 ?31.0       Sensory NCS      Nerve / Sites Rec. Site Peak Lat Ref. PP Amp Ref. Distance Vel.     ms ms ??V ??V mm m/s   L Median - Dig II (Antidromic)      Wrist Index 4.35 ?3.80 47.1 ?10.0 14 40   L Ulnar - Dig V (Antidromic)      Wrist Dig V 2.90 ?3.80 37.6 ?10.0 14 59       EMG Summary Table     Spontaneous MUP Recruitment Comment   Muscle Nerve Roots Fib PSW Fasc Other Amp Dur. PPP Pattern Other   L. First dorsal interosseous Ulnar C8-T1 None None None None N N N Reduced None   L. Pronator teres Median C6-C7 None None None None N N N Normal None   L. Extensor digitorum communis Radial C7-C8 None None None None N N N Normal None   L. Deltoid Axillary C5-C6 None None None None N N N Normal None   L. Cervical paraspinals Spinal C4-C8 None None None None            Summary:  After identifying the patient in the waiting room and reviewing all appropriately available medical records, the patient was taken back to the examination room where the procedure was explained, the sites of examination were noted, the patient???s questions were answered, and the patient???s verbal consent for the procedure was obtained. Studies were performed at [facility] using a radiant warmer and [employer] Synergy EMG system.    Left median motor NCS is normal.    Left ulnar motor NCS demonstrates normal latency, reduced amplitude and normal conduction velocity.    Left median sensory NCS demonstrates prolonged latency and normal amplitude  Left  ulnar sensory NCS is normal.    EMG was performed in the left upper limb.  No  muscle demonstrates increased abnormal spontaneous activity or evidence of chronic reinnervation.  The FDI does demonstrate reduced recruitment.    Sonographic evaluation of the left ulnar nerve was performed.  The ulnar nerve measures 77mm2 below and 23mm2 above the elbow (normal).   It is enlarged at the epicondyle at 36mm2 with partial subluxation on flexion.  Surrounding structures (vascularity, tendons, muscles and bones) are otherwise normal.    Conclusions:  This is an abnormal study.    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    --Left non-localizable ulnar mononeuropathy. Sonographic evaluation demonstrates focal swelling at the medial epicondyle.    There is NO electrophysiologic evidence of a cervical radiculopathy.    - - - - - - - - - - - - Tracy Jaquith, MD Attending Electromyographer

## 2021-06-20 NOTE — Unmapped (Unsigned)
INTERIM HISTORY:  Ms. Wanat returns today for evaluation of her left upper extremity. She is a 56 year old female who I saw for initial visit in December 2022. At that time, she has a left wrist mass as well as numbness and tingling in bilateral hands most notably in the radial 3 digits.  She was recommended US of the mass as well as electrical studies.  She had US performed on 04/09/2021 that confirmed ganglion cyst.  Electrical studies were performed on 06/14/2021. She presents today to review results.     PHYSICAL EXAMINATION:  EXTREMITIES:  - Examination of the left upper extremity shows she is neuovascularly intact with normal sensation and strength throughout.  She has full and painless range of motion with no instability.  Skin is intact.  Fingers are warm and well perfused with brisk capillary refill and she has a palpable radial pulse.  There is no tenderness to palpation throughout.    ASSESSMENT:   1.  EMG proven left carpal tunnel syndrome   2.  Left dorsal wrist ganglion     PLAN:  Ms. Noguchi ***.

## 2021-06-21 ENCOUNTER — Ambulatory Visit
Admit: 2021-06-21 | Discharge: 2021-06-22 | Payer: PRIVATE HEALTH INSURANCE | Attending: Orthopaedic Surgery | Primary: Orthopaedic Surgery

## 2021-07-01 ENCOUNTER — Ambulatory Visit
Admit: 2021-07-01 | Discharge: 2021-07-02 | Payer: PRIVATE HEALTH INSURANCE | Attending: Internal Medicine | Primary: Internal Medicine

## 2021-07-01 DIAGNOSIS — Z6841 Body Mass Index (BMI) 40.0 and over, adult: Principal | ICD-10-CM

## 2021-07-01 NOTE — Unmapped (Signed)
SPORTS MEDICINE RETURN VISIT    ASSESSMENT AND PLAN      Diagnosis ICD-10-CM Associated Orders   1. Wrist tendonitis  M77.8 triamcinolone acetonide (KENALOG) injection 10 mg           She has mixed symptoms including tendinitis at the left wrist fourth dorsal compartment.  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  Performed injection of this area.  She could consider a steroid injection of the carpal tunnel in the future as well if she is not inclined to pursue surgical intervention    Return if symptoms worsen or fail to improve.    Procedure(s):  Ultrasound-guided injection of Fourth dorsal compartment - left    Consent   After discussing the various treatment options for the condition, It was agreed that a corticosteroid injection would be the next step in treatment. The nature of and the indications for a corticosteroid and / or local anaesthetic injection were reviewed in detail with the patient today. The inherent risks of injection including infection, bleeding, allergic reaction, increased pain, incomplete relief or temporary relief of symptoms, alterations of blood glucose levels requiring careful monitoring and treatment as indicated, tendon, ligament or articular cartilage rupture or degeneration, nerve injury, skin depigmentation, and/or fatty atrophy were discussed.     Procedure   After the risks and benefits of the procedure were explained,verbal consent was given, and a procedural time-out was performed. The Fourth dorsal compartment and surrounding structures were visualized with ultrasound .  The site for the injection was properly marked and prepped with Chlorhexadine solution.  The injection site was anesthetized with ethyl chloride  . Using ultrasound guidance, the Fourth dorsal compartment was visualized and the tendon sheath injected with 10 mg milligrams of Kenalog mixed with 0.5 ml of 2% lidocaine, using a sterile technique and a 25 gauge 1.5 inch needle.     During the injection, there was unrestricted flow and care was taken not to inject corticosteroid into the skin or subcutaneous tissues. There were no complications during the procedure. Image(s) of the procedure were recorded and save for future reference.    NEED FOR SONOGRAPHIC GUIDANCE    Given the complexity of this problem, the anatomic location of this structure, sonographic guidance is recommended to prevent injury to neurovascular structures and confirm accuracy of injection. The accuracy of doing these injections blind is poor and the benefit to the patient by using ultrasound guidance is significant to avoid complications.     Reference:  American Medical Society for Sports Medicine (AMSSM) position statement: interventional musculoskeletal ultrasound in sports medicine.  Morey Hummingbird MM, Adams E, Berkoff D, Concoff AL, Ria Clock Sports Med. 2014 Oct 20. pii: [id number]. doi: 10.1136/bjsports-[id number]    Post procedure a sterile band-aide was applied. Post-injection instructions were given regarding post-procedure care, when to follow up in clinic and what to expect from the procedure. The patient tolerated the injection well and was discharged without complication.          SUBJECTIVE     Chief Complaint:   Chief Complaint   Patient presents with   ??? Left Hand - Pain       History of Present Illness: 56 y.o. female who presents for left wrist pain.  Referred by Dr. Jarold Motto.  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  There used to be swelling at the spot but that has  gone down since wearing her wrist brace more.    Past Medical History:   Past Medical History:   Diagnosis Date   ??? Arthritis 2011    Lower back   ??? BMI 60.0-69.9, adult (CMS-HCC) 05/29/2021   ??? Diabetes mellitus (CMS-HCC)    ??? Eczema    ??? High blood pressure          OBJECTIVE     Physical Exam:  Vitals:   Wt Readings from Last 3 Encounters:   05/28/21 (!) 150.7 kg (332 lb 5 oz)   05/21/21 (!) 147.4 kg (325 lb)   02/12/21 (!) 144.3 kg (318 lb 1.9 oz)     Estimated body mass index is 59.81 kg/m?? as calculated from the following:    Height as of 05/28/21: 158.8 cm (5' 2.5).    Weight as of 05/28/21: 150.7 kg (332 lb 5 oz).  Gen: Well-appearing female in no acute distress  MSK: Mild swelling at the dorsal wrist.  Pain with wrist extension.  Pain with wrist finger extension    Imaging/other tests: Dr. Norval Gable note, EMG, previous ultrasound      ADMINISTRATIVE     I have personally reviewed and interpreted the images (as available).  I have personally reviewed prior records and incorporated relevant information above (as available).    MEDICAL DECISION MAKING (level of service defined by 2/3 elements)     Number/Complexity of Problems Addressed 1 or more chronic illnesses with exacerbation, progression, or side effects of treatment (99204/99214)   Amount/Complexity of Data to be Reviewed/Analyzed 3 points: Review prior notes (1 point per unique source); Review test results (1 point per unique test); Order tests (1 point per unique test); Assessment requiring an independent historian (99204/99214)   Risk of Complications/Morbidity/Mortality of Management Decision for MINOR Surgery (Including Injection) WITHOUT Risk Factors (99203/99213)     TIME     Total Time for E/M Services on the Date of Encounter Time-based coding not utilized for this encounter     CONSULTATION     Consultation services provided No     MODIFIER -25     Significant, Separately Billable Evaluation and Management YES - The patient's condition required a significant, separately identifiable, medical necessary evaluation and management service by the provider in addition to the procedure performed on the same date of service. The evaluation and management service was above and beyond the usual preoperative and postoperative care associated with the procedure.       PROCEDURES     Procedures     DME     DME ORDER:  Dx: ,

## 2021-07-01 NOTE — Unmapped (Signed)
Plan: Continue to wear your brace after today's injection    You received a corticosteroid injection to reduce pain and inflammation.  Please note that it can take up to 2 weeks for this injection to fully work.  While many people will feel relief sooner, please be patient.     What are some of the possible side effects of a steroid injection?     Common side effects:  - temporarily elevated blood sugar (in diabetic patients) that can last a few days  - flushing of the skin, especially the face  - temporary rise in blood pressure  - discoloration or atrophy of the skin at the injection site     Call your doctor at once if you have:  - persistent worsening pain or swelling, fever;  - blurred vision, tunnel vision, eye pain, or seeing halos around lights;  - fast or slow heartbeats;  - increased blood pressure that is associated with severe headache, blurred vision, pounding in your neck or ears, anxiety, nosebleed;  - headaches, ringing in your ears, dizziness, nausea, vision problems, pain behind your eyes     This is not a complete list of side effects and others may occur. Call your doctor for medical advice about side effects.     Effect on vaccines:   - Current guidelines advise that you not receive a steroid injection two weeks before or after a COVID-19 vaccination dose.  It may reduce the immune response, and thus the protection, of the vaccine.     What other drugs may be affected after the injection?  Many drugs can interact with steroids. Not all possible interactions are listed here. Tell your doctor about all your current medicines and any you start or stop using, especially:  - an antibiotic or antifungal medication;  - birth control pills or hormone replacement therapy;  - a blood thinner (warfarin, Coumadin, and others);  - a diuretic or water pill;  - insulin or oral diabetes medicine;  - medicine to treat tuberculosis;  - a nonsteroidal anti-inflammatory drug or NSAID (aspirin, ibuprofen, naproxen, diclofenac, indomethacin, Advil, Aleve, Celebrex, and many others); or  - seizure medication.    Generally with one injection, changes to the above medications is not necessary.      You can resume your normal daily activities, but consider resting the injected area for the next few days.       Thank you for coming to [facility] and our clinic today!     We aim to provide you with the highest quality, individualized care.  If you have any unanswered questions after the visit, please do not hesitate to reach out to [place] on MyChart or leave a message for the nurse.  ?  MyChart messages: These messages can be sent to your provider and will be checked by their clinical support staff.? The messages are checked throughout the day during normal business hours from 8:30 am-4:00 pm Monday-Friday, however responses may take up to 48 hours.? Please use this method of communication for non-urgent and non-emergent concerns, questions, refill requests or inquiries only.? ?Our team will help respond to all of your questions.? Please note that you may be asked to see a provider by either a telehealth or in person visit if it is deemed your questions are best handled in the clinic setting in person.??  ?  Please keep in mind, these messages are not real time communications, so be patient when waiting for a  response.    If you do not have access to MyChart, do not know how to use MyChart or have an issue that may require more extensive discussion, please call the nurses' call line: [phone].? This line is checked throughout the day and will be responded to as time allows.? Please note that return calls could take up to 48 hours, depending on the nature of the need.?  ?  If you have an issue that requires emergent attention that cannot wait; either call the Orthopaedics resident on call at [phone], consider coming to our [facility] walk-in clinic, or go to the nearest Emergency Department.    If you need to schedule future appointments, please call [phone].     We look forward to seeing you again in the future and appreciate you choosing [place] for your care!    Thank you,                We provide innovative and comprehensive patient centered care that is supported by evidence-based research                                                                                                    RESEARCH PARTICIPATION    Please check out our current research studies to see if you or someone you know may qualify at:    [email or web address]

## 2021-07-01 NOTE — Unmapped (Signed)
Hillary does she need a referral to see Dr. Loa Socks?

## 2021-07-01 NOTE — Unmapped (Signed)
[facility] Clinical Assessment & Refill Coordination Note    Tracy Robles, Lyman: 14-Nov-1965  Phone: [phone] (home)     All above HIPAA information was verified with patient.     Was a Nurse, learning disability used for this call? No    Specialty Medication(s):   Inflammatory Disorders: Cosentyx     Current Outpatient Medications   Medication Sig Dispense Refill   ??? amLODIPine (NORVASC) 10 MG tablet Take 1 tablet (10 mg total) by mouth daily. 90 tablet 3   ??? biotin 1 mg cap Take by mouth.     ??? blood sugar diagnostic (GLUCOSE BLOOD) Strp Check blood sugar as directed once a day and for symptoms of high or low blood sugar. 100 strip 3   ??? blood-glucose meter kit Use as instructed 1 each 0   ??? cetirizine (ZYRTEC) 5 MG tablet Take 5 mg by mouth once as needed.     ??? clobetasoL (TEMOVATE) 0.05 % external solution Apply topically to scalp twice daily. 50 mL 6   ??? clobetasol (TEMOVATE) 0.05 % ointment Apply topically to rash on body twice daily. 60 g 6   ??? gabapentin (NEURONTIN) 300 MG capsule Take 2 capsules (600 mg total) by mouth two (2) times a day. 360 capsule 3   ??? losartan-hydrochlorothiazide (HYZAAR) 100-25 mg per tablet Take 1 tablet by mouth once daily 30 tablet 0   ??? metFORMIN (GLUCOPHAGE-XR) 500 MG 24 hr tablet Take 2 tablets (1,000 mg total) by mouth daily with evening meal. 270 tablet 3   ??? multivit with min-folic acid (MULTIVITAMIN GUMMIES) 200 mcg Chew Chew.     ??? ONETOUCH DELICA PLUS LANCET 33 gauge Misc CHECK BLOOD SUGAR AS DIRECTED ONCE A DAY AND FOR SYMPTOMS OF HIGH OR LOW BLOOD SUGAR     ??? secukinumab (COSENTYX PEN, 2 PENS,) 150 mg/mL PnIj injection Inject 2 mL (300 mg total) under the skin every twenty-eight (28) days. 2 mL 11   ??? simvastatin (ZOCOR) 20 MG tablet Take 1 tablet (20 mg total) by mouth nightly. 90 tablet 3   ??? spironolactone (ALDACTONE) 25 MG tablet Take 1 tablet (25 mg total) by mouth daily. 90 tablet 3   ??? tirzepatide (MOUNJARO) 2.5 mg/0.5 mL PnIj Inject 2.5 mg under the skin every seven (7) days. (Patient not taking: Reported on 05/28/2021) 2 mL 0   ??? tirzepatide (MOUNJARO) 5 mg/0.5 mL PnIj Inject 5 mg under the skin every seven (7) days. 2 mL 1   ??? triamcinolone (KENALOG) 0.1 % cream Apply topically twice daily as needed only. 454 g 1     No current facility-administered medications for this visit.        Changes to medications: Zaia reports starting the following medications: beet jelly, B12    Allergies   Allergen Reactions   ??? Lisinopril Cough   ??? Latex      Other reaction(s): UNKNOWN   ??? Penicillins      Other reaction(s): UNSPECIFIED       Changes to allergies: No    SPECIALTY MEDICATION ADHERENCE     Costentyx 150 mg/ml: 0 days of medicine on hand       Medication Adherence    Patient reported X missed doses in the last month: 0  Specialty Medication: Cosentyx          Specialty medication(s) dose(s) confirmed: Regimen is correct and unchanged.     Are there any concerns with adherence? No  Adherence counseling provided? Not needed    CLINICAL MANAGEMENT AND INTERVENTION      Clinical Benefit Assessment:    Do you feel the medicine is effective or helping your condition? Yes    Clinical Benefit counseling provided? Not needed    Adverse Effects Assessment:    Are you experiencing any side effects? No    Are you experiencing difficulty administering your medicine? No    Quality of Life Assessment:    Quality of Life    Rheumatology  Oncology  Dermatology  1. What impact has your specialty medication had on the symptoms of your skin condition (i.e. itchiness, soreness, stinging)?: Some  2. What impact has your specialty medication had on your comfort level with your skin?: Tremendous  Cystic Fibrosis               Have you discussed this with your provider? Not needed    Acute Infection Status:    Acute infections noted within Epic:  No active infections  Patient reported infection: None    Therapy Appropriateness:    Is therapy appropriate and patient progressing towards therapeutic goals? Yes, therapy is appropriate and should be continued    DISEASE/MEDICATION-SPECIFIC INFORMATION      For patients on injectable medications: Patient currently has 0 doses left.  Next injection is scheduled for 3/28.    PATIENT SPECIFIC NEEDS     - Does the patient have any physical, cognitive, or cultural barriers? No    - Is the patient high risk? No    - Does the patient require a Care Management Plan? No     SOCIAL DETERMINANTS OF HEALTH     At the [facility], we have learned that life circumstances - like trouble affording food, housing, utilities, or transportation can affect the health of many of our patients.   That is why we wanted to ask: are you currently experiencing any life circumstances that are negatively impacting your health and/or quality of life? Patient declined to answer    Social Determinants of Health     Food Insecurity: Not on file   Tobacco Use: Low Risk    ??? Smoking Tobacco Use: Never   ??? Smokeless Tobacco Use: Never   ??? Passive Exposure: Not on file   Transportation Needs: Not on file   Alcohol Use: Not on file   Housing/Utilities: Not on file   Substance Use: Not on file   Financial Resource Strain: Not on file   Physical Activity: Not on file   Health Literacy: Low Risk    ??? : Never   Stress: Not on file   Intimate Partner Violence: Not on file   Depression: Not at risk   ??? PHQ-2 Score: 0   Social Connections: Not on file       Would you be willing to receive help with any of the needs that you have identified today? Not applicable       SHIPPING     Specialty Medication(s) to be Shipped:   Inflammatory Disorders: Cosentyx    Other medication(s) to be shipped: No additional medications requested for fill at this time     Changes to insurance: No    Delivery Scheduled: Yes, Expected medication delivery date: 3/23.     Medication will be delivered via Same Day Courier to the confirmed prescription address in [facility]    The patient will receive a drug information handout for each medication shipped and additional FDA  Medication Guides as required.  Verified that patient has previously received a Conservation officer, historic buildings and a Surveyor, mining.    The patient or caregiver noted above participated in the development of this care plan and knows that they can request review of or adjustments to the care plan at any time.      All of the patient's questions and concerns have been addressed.    Clydell Hakim   [facility] Specialty Pharmacist

## 2021-07-02 MED ADMIN — triamcinolone acetonide (KENALOG) injection 10 mg: 10 mg | INTRA_ARTICULAR | @ 01:00:00 | Stop: 2021-07-01

## 2021-07-02 NOTE — Unmapped (Signed)
Addended by: Sonny Dandy A on: 07/02/2021 09:48 AM     Modules accepted: Orders

## 2021-07-04 MED FILL — COSENTYX PEN 300 MG/2 PENS (150 MG/ML) SUBCUTANEOUS: SUBCUTANEOUS | 28 days supply | Qty: 2 | Fill #4

## 2021-07-05 NOTE — Unmapped (Signed)
Weight Management Services Financial Information Form    Hospital NPI: [id number]   Physicians NPI: [id number]     MRN: [id number]  Patient: Tracy Robles    Insurance Company: [place]  Telephone: [phone]  Policy/Subscriber ID:     Bonnita Levan Information (if different from patient)  Name: n/a  Date of Birth:  n/a     Date Called: 07/05/21  Spoke with: Barron Alvine  Reference # (if given): [id number]    Is Bariatric Surgery Covered: Yes  Covered Surgeries:   ??? 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 [id number]: Laparoscopic gastric sleeve     **Completion of this form does not guarantee coverage, and is subject to change. The patient is instructed at the time of scheduling that they need to call their insurance company directly to confirm bariatric surgery coverage benefits**     Deductible Amount? 250 Out of Pocket Amount? 3000  Is deductible applied to out of pocket amount? Yes   Once out of pocket is meet, what is the benefit coverage?  100 %  What is the speciality copay? $0 ; deductible and coinsurance0    What requirements must be met for surgery to be approved:  [phone] [facility]    Physician Supervised Diet? No How Long? n/a  Does it have to be consecutive? No  Does it have to be done by PCP or can it be by a dietitian?  n/a  How recent does it have to be? (past , , etc.) n/a  Do I need a weight history? (34yr, 72yrs, 78yrs) n/a  Do I need a clearance/referral letter from a doctor? n/a   If yes, does it have to come from PCP? n/a  Are nutrition consultations a covered benefit? Yes  (List next to visit type max number of visits, if applicable)  ??? [id number]: 15, 30, 45, 60 min preventive   ??? [id number]: Initial 1:1 nutrition counseling   ??? [id number]: Follow up 1:1 nutrition counseling  ??? [id number]: Group MNT nutrition counseling   ??? G0447: IBT Individual   ??? G0473: IBT Group

## 2021-07-08 ENCOUNTER — Ambulatory Visit: Admit: 2021-07-08 | Payer: PRIVATE HEALTH INSURANCE

## 2021-07-08 NOTE — Unmapped (Unsigned)
OUTPATIENT OCCUPATIONAL THERAPY    UPPER EXTREMITY EVALUATION    Patient Name: Tracy Robles  Date of Birth:1965/04/16  Date: 07/08/2021  Visit #: 1  Plan of Care Certification Dates:     Encounter Diagnoses   Name Primary?   ??? Wrist tendonitis    ??? Left wrist pain Yes     Reason for referral: Reason for Exam    Comments: Range of motion and strengthening left wrist. ??Modalities as indicated.Marland Kitchen     Referring Provider: Theodora Blow Mackinno*  Onset of Symptoms: see below  Per Referring Provider's note:   ASSESSMENT:   1.  EMG-proven mild left carpal tunnel syndrome   2.  Left dorsal hand ganglion  3.  Left fourth dorsal compartment tendonitis   ??  PLAN:  Ms. Beachem and I discussed her symptoms and treatment options.  We reviewed her electrical studies that show mild left carpal tunnel syndrome. We discussed options including bracing, injections, or surgery.  She would like to continue with bracing.  We also discussed her ultrasound that confirmed dorsal wrist ganglion within the fourth extensor compartment.  She is also having symptoms of fourth dorsal compartment tendonitis.  We discussed monitoring, injection or surgery.  She would like to avoid surgery. I recommend she see our non operative sports colleagues for injection of the fourth dorsal compartment.  I have also placed a referral for hand therapy for range of motion and strengthening of the left wrist.  I will see her back on an as needed basis.     Communication preference: verbal, written, visual  Prognosis: good due to CLOF and attitude    OT ASSESSMENT:   56 y.o. year old individual with above diagnosis. Haniyyah is not interested in returning to hand therapy. She was given education today regarding her diagnoses and encouraged to return if anything gets worse. Pt able to progress and continue HEP independently outside of clinic. No further Hand OT indicated at this time, pt agrees with plan. Encouraged pt to reach out via MyChart if progress stagnates or worsens.     Previous Level of Function: Pt was previously independent with all ADLs and IADLs.       CURRENT LEVEL OF FUNCTION    Social and Occupational: [place] for [place], lives with mom in the home.   Current Level of Function: Pt is currently independent.    Low complexity: This patient demonstrates 1-3 performance deficits relating to physical, cognitive, and psychosocial skills (see Quick DASH assessment) resulting in activity limitations and/or participation restrictions.  This patient has no co-morbidities affecting occupational performance.  A problem focused assessment was performed.  Please refer to Current level of function section for further details.     Short Term Goals:  1. In 1 session, patient will perform home exercise program with need for cuing to max IND with ADLs and IADLs. (met)  2. In 1 session, patient will demonstrate understanding of her diagnoses and corresponding treatment plans.    Long Term Goals:   n/a    OT  PLAN OF CARE:  Pt will participate in:  Self Care/Hometraining  Orthotic Fit/Management   Therapeutic Exercise  Therapeutic Activity   Neuromuscular Re-education  Ultrasound  Hot/Cold Pack  Electrical Stimulation  Iontophoresis  Orthotic/Prosthetic Measure and Fit   Joint Mobilization  Physical Performance Measure   Manual Therapy    Planned frequency and duration of treatment: Pt able to progress and continue HEP independently outside of clinic. No further Hand  OT indicated at this time, pt agrees with plan. Encouraged pt to reach out via MyChart if progress stagnates or worsens.  Plan will be adjusted as necessary.     Patient in agreement with plan of care?: Yes    SUBJECTIVE:  Patient goals: I am not sure why I am here    PAST MEDICAL HISTORY:  Reviewed   Past Medical History:   Diagnosis Date   ??? Arthritis 2011    Lower back   ??? BMI 60.0-69.9, adult (CMS-HCC) 05/29/2021   ??? Diabetes mellitus (CMS-HCC)    ??? Eczema    ??? High blood pressure        Past Surgical History: Reviewed  No past surgical history on file.    Allergies: Reviewed  Lisinopril, Latex, and Penicillins    Medications: Reviewed    Current Outpatient Medications:   ???  amLODIPine (NORVASC) 10 MG tablet, Take 1 tablet (10 mg total) by mouth daily., Disp: 90 tablet, Rfl: 3  ???  biotin 1 mg cap, Take by mouth., Disp: , Rfl:   ???  blood sugar diagnostic (GLUCOSE BLOOD) Strp, Check blood sugar as directed once a day and for symptoms of high or low blood sugar., Disp: 100 strip, Rfl: 3  ???  blood-glucose meter kit, Use as instructed, Disp: 1 each, Rfl: 0  ???  cetirizine (ZYRTEC) 5 MG tablet, Take 5 mg by mouth once as needed., Disp: , Rfl:   ???  clobetasoL (TEMOVATE) 0.05 % external solution, Apply topically to scalp twice daily., Disp: 50 mL, Rfl: 6  ???  clobetasol (TEMOVATE) 0.05 % ointment, Apply topically to rash on body twice daily., Disp: 60 g, Rfl: 6  ???  gabapentin (NEURONTIN) 300 MG capsule, Take 2 capsules (600 mg total) by mouth two (2) times a day., Disp: 360 capsule, Rfl: 3  ???  losartan-hydrochlorothiazide (HYZAAR) 100-25 mg per tablet, Take 1 tablet by mouth once daily, Disp: 30 tablet, Rfl: 0  ???  metFORMIN (GLUCOPHAGE-XR) 500 MG 24 hr tablet, Take 2 tablets (1,000 mg total) by mouth daily with evening meal., Disp: 270 tablet, Rfl: 3  ???  multivit with min-folic acid (MULTIVITAMIN GUMMIES) 200 mcg Chew, Chew., Disp: , Rfl:   ???  ONETOUCH DELICA PLUS LANCET 33 gauge Misc, CHECK BLOOD SUGAR AS DIRECTED ONCE A DAY AND FOR SYMPTOMS OF HIGH OR LOW BLOOD SUGAR, Disp: , Rfl:   ???  secukinumab (COSENTYX PEN, 2 PENS,) 150 mg/mL PnIj injection, Inject 2 mL (300 mg total) under the skin every twenty-eight (28) days., Disp: 2 mL, Rfl: 11  ???  simvastatin (ZOCOR) 20 MG tablet, Take 1 tablet (20 mg total) by mouth nightly., Disp: 90 tablet, Rfl: 3  ???  spironolactone (ALDACTONE) 25 MG tablet, Take 1 tablet (25 mg total) by mouth daily., Disp: 90 tablet, Rfl: 3  ???  tirzepatide (MOUNJARO) 2.5 mg/0.5 mL PnIj, Inject 2.5 mg under the skin every seven (7) days. (Patient not taking: Reported on 05/28/2021), Disp: 2 mL, Rfl: 0  ???  tirzepatide (MOUNJARO) 5 mg/0.5 mL PnIj, Inject 5 mg under the skin every seven (7) days., Disp: 2 mL, Rfl: 1  ???  triamcinolone (KENALOG) 0.1 % cream, Apply topically twice daily as needed only., Disp: 454 g, Rfl: 1    Precautions: none    Prior OT Service: no    Pain: 5/10 shooting pain in [facility] area    OBJECTIVE    Sensation: bilaterally in both median and ulnar nerve distributions.  Upper Extremity Function:    Shoulder:  Both hurt when she lays on them,    Elbow:  WFL    Hand:   Pt is right hand dominant               AROM (degrees) Date:   Right Date:   Left   Wrist   extension/flexion     Supination/pronation     rad/uln deviation     Composite flex to [facility]   (cm lack) [facility] WFL   Index     Middle     Ring     Small     Digit Extension     Thumb  Opposition          Gross grip strength (pounds)    Position 2 3/27 R/L  45/43    Pinch strength (pounds)  Pincer  Tripod  Keypinch       Wound/Incision(s) or Scar: none    Edema: none.      Self Care/home training (30 min):  2-4 years of numbness and tingling in both ulnar and carpal tunnel locations.  Educated pt on reasoning behind conservative management of both cubital and carpal tunnel. Issued handouts on conservative treatment for each. Demo of towel wrap for elbow, as pt sleeps with arm bent and then wakes up with numbness/tingling in ulnar nerve dist.  Pt has been using neutral wrist orthoses in the night which have been helpful, I encouraged she continue this.    Home Program:   Apply low to moderate heat 10 min prior to exercises for improved tissue extensibility    AROM:  Tendon Glides      The patient and myself were wearing protective masks and I was wearing protective eyewear during the entirety of the session.     I reviewed the no-show/attendance policy with the patient and caregiver(s). The family is aware that they must call to cancel appointments more than 24 hours in advance. They are also aware that if they late cancel or no-show three times, we reserve the right to cancel their remaining appointments. This policy is in place to allow [place] to best serve the needs of our caseload.    Treatment Rendered:   Self Care/Home Training: 30 min    Total Evaluation Time: 15 mins    Patient Education:  Topics: home program, disease process  Education Provided to: patient  Education Type: education, demonstration, literature  Response to education/teachback: verbal understanding received, return demonstration    I attest that I have reviewed the above information.  SignedCarie Caddy, OT  07/08/2021 3:34 PM {PATIENT/FAMILY/CAREGIVER/SO:21563}  Education Type: education, demonstration, literature  Response to education/teachback: verbal understanding received, return demonstration    I attest that I have reviewed the above information.  SignedCarie Caddy, OT  07/08/2021 3:34 PM

## 2021-07-09 NOTE — Unmapped (Signed)
Addended by: Sonny Dandy A on: 07/08/2021 07:19 PM     Modules accepted: Orders

## 2021-07-10 ENCOUNTER — Ambulatory Visit: Admit: 2021-07-10 | Discharge: 2021-07-11 | Payer: PRIVATE HEALTH INSURANCE

## 2021-07-10 DIAGNOSIS — Z029 Encounter for administrative examinations, unspecified: Principal | ICD-10-CM

## 2021-07-10 NOTE — Unmapped (Unsigned)
Assessment and Plan:     Administrative encounter      Filled out paperwork for patient to request missing work due to taking care of patient's mother. She would like to be able to take her mom to appointments when needed. No past dates requiring coverage, filling forms out with patient's assistance.   Discussed coverage is estimated to cover for appointments every ~1 month and symptom flares may last 2 days.   Form copied and scanned into patient's chart. Will fax to number on form as there is a date typo in the form.     Barriers to recommended plan: None identified    No follow-ups on file.    Subjective:     HPI: Tracy Robles is a 56 y.o. female here for FMLA.    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 She provides medical, emotional, physical, care and transportation for her mom. Patient requesting time off to take her mother to doctor's appointments. Patient's mom has MRI appointment follow up. Does not follow with specialists.      ROS:   Review of Systems     Review of systems negative unless otherwise noted as per HPI.      The following portions of the patient's history were reviewed and updated as appropriate: allergies, current medications, past family history, past medical history, past social history, past surgical history and problem list.     Objective:     Vitals:    07/10/21 1608   BP: 137/84   Pulse: 105   Temp: 36.4 ??C (97.5 ??F)     There is no height or weight on file to calculate BMI.    Physical Exam  Vitals and nursing note reviewed.   Constitutional:       Appearance: Normal appearance.   HENT:      Head: Normocephalic and atraumatic.   Neurological:      Mental Status: She is alert and oriented to person, place, and time.   Psychiatric:         Mood and Affect: Mood normal.         Behavior: Behavior normal.        Allergies:     Lisinopril, Latex, and Penicillins    [facility]:     Medication adherence and barriers to the treatment plan have been addressed. Opportunities to optimize healthy behaviors have been discussed. Patient / caregiver voiced understanding.    I attest that I, Katheran Awe Dajon Rowe, personally documented this note while acting as scribe for Payton Mccallum, MD.      Katheran Awe Analycia Khokhar, Scribe.  07/10/2021     The documentation recorded by the scribe accurately reflects the service I personally performed and the decisions made by me.    Payton Mccallum, MD

## 2021-07-11 ENCOUNTER — Encounter: Payer: Self-pay | Admitting: Podiatry

## 2021-07-11 ENCOUNTER — Ambulatory Visit: Payer: BC Managed Care – PPO | Admitting: Podiatry

## 2021-07-11 ENCOUNTER — Encounter: Payer: Self-pay | Admitting: *Deleted

## 2021-07-11 DIAGNOSIS — M79674 Pain in right toe(s): Secondary | ICD-10-CM | POA: Diagnosis not present

## 2021-07-11 DIAGNOSIS — B351 Tinea unguium: Secondary | ICD-10-CM | POA: Diagnosis not present

## 2021-07-11 DIAGNOSIS — E1165 Type 2 diabetes mellitus with hyperglycemia: Secondary | ICD-10-CM | POA: Diagnosis not present

## 2021-07-11 DIAGNOSIS — M79675 Pain in left toe(s): Secondary | ICD-10-CM | POA: Diagnosis not present

## 2021-07-11 DIAGNOSIS — L608 Other nail disorders: Secondary | ICD-10-CM

## 2021-07-11 NOTE — Progress Notes (Addendum)
This patient returns to my office for at risk foot care.  This patient requires this care by a professional since this patient will be at risk due to having diabetes.  This patient is unable to cut nails herself since the patient cannot reach her nails.These nails are painful walking and wearing shoes. Previous surgery for ingrown toenails both big toes. This patient presents for at risk foot care today. ? ?General Appearance  Alert, conversant and in no acute stress. ? ?Vascular  Dorsalis pedis and posterior tibial  pulses are palpable  bilaterally.  Capillary return is within normal limits  bilaterally. Temperature is within normal limits  bilaterally. ? ?Neurologic  Senn-Weinstein monofilament wire test within normal limits  bilaterally. Muscle power within normal limits bilaterally. ? ?Nails Thick disfigured discolored nails with subungual debris  hallux toenailsbilaterally. No evidence of bacterial infection or drainage bilaterally. ? ?Orthopedic  No limitations of motion  feet .  No crepitus or effusions noted.  No bony pathology or digital deformities noted. ? ?Skin  normotropic skin with no porokeratosis noted bilaterally.  No signs of infections or ulcers noted.    ? ?Onychomycosis  Pain in right toes  Pain in left toes ? ?Consent was obtained for treatment procedures.   Mechanical debridement of nails 1-5  bilaterally performed with a nail nipper.  Filed with dremel without incident.  ? ? ?Return office visit    3 months                 Told patient to return for periodic foot care and evaluation due to potential at risk complications. ? ? ?Helane Gunther DPM   ?

## 2021-07-15 MED ORDER — MOUNJARO 2.5 MG/0.5 ML SUBCUTANEOUS PEN INJECTOR
SUBCUTANEOUS | 0 refills | 0 days | Status: CP
Start: 2021-07-15 — End: ?
  Filled 2021-07-22: qty 2, 28d supply, fill #0

## 2021-07-15 NOTE — Unmapped (Signed)
Tracy Robles looks like referral has been placed where can she call.

## 2021-07-30 NOTE — Unmapped (Signed)
Name:  Tracy Robles  DOB: 10-Mar-1966  Today's Date: 08/01/2021  Age:  56 y.o.    A/P:  Problem List Items Addressed This Visit          Respiratory    Sleep apnea - Primary     History of OSA, did not tolerate CPAP, patient needing to get study completed before June.   - Advised patient try to get on cancellation list with current referral.   - [facility] with [facility] device. Patient's work requiring patient to show she is successfully treating sleep apnea.          Relevant Orders    Polysomnography (with CPAP)       Cardiovascular and Mediastinum    Primary hypertension     BP is borderline controlled based on today's blood pressure in office: 145/87, at recheck 142/82. Known hx of OSA, does not tolerate CPAP. Plans to follow up for sleep study for dental device titration.   - Continue amlodipine (Norvasc) and losartan/hydrochlorothiazide (Hyzaar) and spironolactone 25 mg dailyas prescribed.   - Discussed goal is Goal is 130/80.   - Encouraged healthy, low-salt diet and regular exercise.     BP Readings from Last 3 Encounters:   08/01/21 142/82   07/10/21 137/84   05/28/21 123/75     Lab Results   Component Value Date    K 3.8 05/21/2021    K 4.2 09/06/2020    NA 142 05/21/2021    NA 141 09/06/2020    CREATININE 0.95 (H) 05/21/2021    CREATININE 0.94 (H) 09/06/2020                Other    Class 3 severe obesity with body mass index (BMI) of 50.0 to 59.9 in adult (CMS-HCC)     Congratulated pt on weight loss today. Started Mounjaro 2.5 mg weekly last week, will continue x 3 weeks then INCREASE to Mounjaro 5 mg weekly. Patient not currently interested in bariatric surgery.   - Reviewed risks and benefits associated with bariatric surgery.   - Meds tried: wegovy (success, lost insurance covered)  ozempic (didn't work)   - Patient will need to try Bydureon, Rybelsus, Trulicity, Victoza when coupon for [employer] no longer works (end of June).       Today???s BP: 142/82. Current BMI: (!) 59.36.  Wt Readings from Last 4 Encounters:   08/01/21 (!) 149.7 kg (330 lb)   05/28/21 (!) 150.7 kg (332 lb 5 oz)   05/21/21 (!) 147.4 kg (325 lb)   02/12/21 (!) 144.3 kg (318 lb 1.9 oz)   Goal: To lose 5-10 pounds in 3 months from the present weight: (!) 149.7 kg (330 lb). Patient agrees to follow up with me closely for weight check/ lifestyle check.   Diet: Stop drinking soda, sweet tea, juice, and other sugary drinks (if any). Significantly decrease processed carbohydrates, desserts, or unhealthy snacks. Advised that dietician can help make appropriate manageable recommendations.  Referrals - none today.  Exercise: Advised to exercise at least 30 minutes per day (walking, treadmill, bicycling, swimming, gym). Advised Pt to start exercising 10 minutes 3 times per week and increase by 5 minutes every 2 weeks until exercising 30 minutes 3 times per week, then add 1 day.          Medication adherence and barriers to the treatment plan have been addressed. Opportunities to optimize healthy behaviors have been discussed. Patient / caregiver voiced understanding.  Return in about 6 weeks (around 09/12/2021) for Recheck CDM.    S:  Tracy Robles is a 56 y.o. female who presents for CDM    Hypertension   Patient with history of hypertension manages with amlodipine 10 mg daily, losartan-hydrochlorothiazide 100-25 mg daily, and spironolactone 25 mg daily. Blood pressure at home not reported, at triage today 145/87, at recheck 142/82 but patient reports feeling stressed. Denies CP, SOB, L/D or peripheral edema.    Sleep apnea  History of sleep apnea, patient not wearing CPAP, would like sleep study. Has appointment in September, but patient requesting being seen before June.     Obesity  Patient is now taking Mounjaro 2.5 mg weekly, started Last Saturday morning. Patient has 3 more doses to go.  At this time, patient is not interested in Bariatric Surgery.     ROS:  A 12 point review of systems was negative except for pertinent items noted in the HPI.    Past medical history, family history, surgical history and social history personally reviewed and verified with patient.     PROBLEM LIST  Patient Active Problem List   Diagnosis    Class 3 severe obesity with body mass index (BMI) of 50.0 to 59.9 in adult (CMS-HCC)    Eczema    Primary hypertension    Psoriasis    Sleep apnea    Type 2 diabetes mellitus with hyperglycemia, without long-term current use of insulin (CMS-HCC)    Encounter to establish care    Back pain    Administrative encounter    Ingrown toenail of right foot    Left shoulder pain    Cramp of both lower extremities    Dermatophytosis of foot    Mass of left hand       O:  Vitals:    08/01/21 1210   BP: 142/82   Pulse:    Temp:    SpO2:      General appearance: No acute distress, well appearing and well nourished.   HEENT: Normocephalic, atraumatic.   NECK: Supple, symmetric, trachea midline, no masses. No lymphadenopathy. No thyromegaly or masses.  HEART: RRR w/ normal S1 and S2. No MRG. DP and PT pulses  2+ bilaterally. No peripheral edema or varicosities.  LUNGS: No signs of respiratory distress. CTA all fields. No wheezes, rales, rhonchi or crackles.   MSK: Normal ROM. Strength normal, equal bilaterally.   SKIN: Warm, dry, intact.+ Acanthosis nigricans   NEURO: Cranial nerves II-XII grossly intact.   PSYCH: A&O x 3. Affect normal. Judgment, insight normal. Makes good eye contact. Smiles appropriately. Well-kempt.    I attest that I, Katheran Awe Rey Dansby, personally documented this note while acting as scribe for [employer], PAC.      Kiondra Caicedo E Jessen Siegman, Scribe.  08/01/2021     The documentation recorded by the scribe accurately reflects the service I personally performed and the decisions made by me.    Mykenna Viele A Placida Cambre, PAC

## 2021-07-31 NOTE — Unmapped (Signed)
[facility] Refill Coordination Note    Specialty Medication(s) to be Shipped:   Inflammatory Disorders: Cosentyx    Other medication(s) to be shipped: No additional medications requested for fill at this time     Tracy Robles, DOB: 1966/01/27  Phone: [phone] (home)       All above HIPAA information was verified with patient.     Was a Nurse, learning disability used for this call? No    Completed refill call assessment today to schedule patient's medication shipment from the [facility] [phone]).  All relevant notes have been reviewed.     Specialty medication(s) and dose(s) confirmed: Regimen is correct and unchanged.   Changes to medications: [name] reports no changes at this time.  Changes to insurance: No  New side effects reported not previously addressed with a pharmacist or physician: None reported  Questions for the pharmacist: No    Confirmed patient received a Conservation officer, historic buildings and a Surveyor, mining with first shipment. The patient will receive a drug information handout for each medication shipped and additional FDA Medication Guides as required.       DISEASE/MEDICATION-SPECIFIC INFORMATION        For patients on injectable medications: Patient currently has 0 doses left.  Next injection is scheduled for 08/09/21.    SPECIALTY MEDICATION ADHERENCE     Medication Adherence    Patient reported X missed doses in the last month: 0  Specialty Medication: Cosentyx  Patient is on additional specialty medications: No  Informant: patient              Were doses missed due to medication being on hold? No    Cosentyx 150 mg/ml: 0 days of medicine on hand         REFERRAL TO PHARMACIST     Referral to the pharmacist: Not needed      [facility]     Shipping address confirmed in Epic.     Delivery Scheduled: Yes, Expected medication delivery date: 08/06/21.     Medication will be delivered via Same Day Courier to the prescription address in Epic WAM.    Jasper Loser   [facility] Specialty Technician

## 2021-08-01 ENCOUNTER — Ambulatory Visit: Admit: 2021-08-01 | Discharge: 2021-08-02 | Payer: PRIVATE HEALTH INSURANCE | Attending: Medical | Primary: Medical

## 2021-08-01 DIAGNOSIS — Z6841 Body Mass Index (BMI) 40.0 and over, adult: Principal | ICD-10-CM

## 2021-08-01 DIAGNOSIS — I10 Essential (primary) hypertension: Principal | ICD-10-CM

## 2021-08-01 DIAGNOSIS — G4730 Sleep apnea, unspecified: Principal | ICD-10-CM

## 2021-08-01 NOTE — Unmapped (Signed)
Congratulated pt on weight loss today. Started Mounjaro 2.5 mg weekly last week, will continue x 3 weeks then INCREASE to Mounjaro 5 mg weekly. Patient not currently interested in bariatric surgery.   - Reviewed risks and benefits associated with bariatric surgery.   - Meds tried: wegovy (success, lost insurance covered)  ozempic (didn't work)   - Patient will need to try Bydureon, Rybelsus, Trulicity, Victoza when coupon for [employer] no longer works (end of June).       Today???s BP: 142/82. Current BMI: (!) 59.36.  Wt Readings from Last 4 Encounters:   08/01/21 (!) 149.7 kg (330 lb)   05/28/21 (!) 150.7 kg (332 lb 5 oz)   05/21/21 (!) 147.4 kg (325 lb)   02/12/21 (!) 144.3 kg (318 lb 1.9 oz)     Goal: To lose 5-10 pounds in 3 months from the present weight: (!) 149.7 kg (330 lb). Patient agrees to follow up with me closely for weight check/ lifestyle check.   Diet: Stop drinking soda, sweet tea, juice, and other sugary drinks (if any). Significantly decrease processed carbohydrates, desserts, or unhealthy snacks. Advised that dietician can help make appropriate manageable recommendations.  Referrals - none today.  Exercise: Advised to exercise at least 30 minutes per day (walking, treadmill, bicycling, swimming, gym). Advised Pt to start exercising 10 minutes 3 times per week and increase by 5 minutes every 2 weeks until exercising 30 minutes 3 times per week, then add 1 day.

## 2021-08-01 NOTE — Unmapped (Signed)
BP is borderline controlled based on today's blood pressure in office: 145/87, at recheck 142/82. Known hx of OSA, does not tolerate CPAP. Plans to follow up for sleep study for dental device titration.   - Continue amlodipine (Norvasc) and losartan/hydrochlorothiazide (Hyzaar) and spironolactone 25 mg dailyas prescribed.   - Discussed goal is Goal is 130/80.   - Encouraged healthy, low-salt diet and regular exercise.     BP Readings from Last 3 Encounters:   08/01/21 142/82   07/10/21 137/84   05/28/21 123/75     Lab Results   Component Value Date    K 3.8 05/21/2021    K 4.2 09/06/2020    NA 142 05/21/2021    NA 141 09/06/2020    CREATININE 0.95 (H) 05/21/2021    CREATININE 0.94 (H) 09/06/2020

## 2021-08-01 NOTE — Unmapped (Signed)
History of OSA, did not tolerate CPAP, patient needing to get study completed before June.   - Advised patient try to get on cancellation list with current referral.   - [facility]erred to [facility] for [facility] study with dental device. Patient's work requiring patient to show she is successfully treating sleep apnea.

## 2021-08-06 MED FILL — COSENTYX PEN 300 MG/2 PENS (150 MG/ML) SUBCUTANEOUS: SUBCUTANEOUS | 28 days supply | Qty: 2 | Fill #5

## 2021-08-06 NOTE — Unmapped (Unsigned)
Name:  Tracy Robles  DOB: [birth year]  Today's Date: 08/05/2021  Age:  56 y.o.    A/P:  Problem List Items Addressed This Visit    None    Medication adherence and barriers to the treatment plan have been addressed. Opportunities to optimize healthy behaviors have been discussed. Patient / caregiver voiced understanding.    No follow-ups on file.    S:  Tracy Robles is a 56 y.o. female who presents for CDM. Last seen by this PCP 08/01/21 - [facility]erred [facility] for [facility]. Obesity - planned to increase to Mounjaro 5 mg weekly.     Hypertension   Patient with history of hypertension manages with amlodipine 10 mg daily and losartan-hydrochlorothiazide 100-25 mg daily, and spironolactone 25 mg daily. Blood pressure at home ***, at triage today ***. Denies CP, SOB, L/D or peripheral edema.    Obesity   Patient taking Mounjaro 5 mg weekly ***.      PHQ-2 Score:    PHQ-9 Score:    {select_status_or_delete_smartlist:64641}    ROS:  A 12 point review of systems was negative except for pertinent items noted in the HPI.    Past medical history, family history, surgical history and social history personally reviewed and verified with patient.     PROBLEM LIST  Patient Active Problem List   Diagnosis    Class 3 severe obesity with body mass index (BMI) of 50.0 to 59.9 in adult (CMS-HCC)    Eczema    Primary hypertension    Psoriasis    Sleep apnea    Type 2 diabetes mellitus with hyperglycemia, without long-term current use of insulin (CMS-HCC)    Encounter to establish care    Back pain    Administrative encounter    Ingrown toenail of right foot    Left shoulder pain    Cramp of both lower extremities    Dermatophytosis of foot    Mass of left hand       O:  There were no vitals filed for this visit.  General appearance: No acute distress, well appearing and well nourished.   HEENT: Normocephalic, atraumatic. Conjunctiva normal, lids w/o swelling, erythema or discharge. TMs gray with intact cone of light. Nasal mucosa, septum, and turbinates w/o edema or erythema. OP normal with no erythema, edema, exudate or lesions.   NECK: Supple, symmetric, trachea midline, no masses. No lymphadenopathy. No thyromegaly or masses.  HEART: RRR w/ normal S1 and S2. No MRG. DP and PT pulses  2+ bilaterally. No peripheral edema or varicosities.  LUNGS: No signs of respiratory distress. CTA all fields. No wheezes, rales, rhonchi or crackles.   ABDOMEN: Non-tender, no masses. Normoactive BS. No hernia appreciated.   MSK: Normal ROM. Strength normal, equal bilaterally.   SKIN: Warm, dry, intact. No suspicious lesions noted on face, trunk, or extremeties.    NEURO: Cranial nerves II-XII intact. Reflexes 2+ and symmetric. No sensory loss.   PSYCH: A&O x 3. Affect normal. Judgment, insight normal. Makes good eye contact. Smiles appropriately. Well-kempt.    I attest that I, Katheran Awe Nolyn Eilert, personally documented this note while acting as scribe for [employer], PAC.      Matteo Banke E Kennedee Kitzmiller, Scribe.  08/19/2021     The documentation recorded by the scribe accurately reflects the service I personally performed and the decisions made by me.    Hillary A Tester, PAC

## 2021-08-08 NOTE — Unmapped (Signed)
4/27-lvm/mychart message to r/s 8/09 appt.

## 2021-08-09 NOTE — Unmapped (Signed)
4/28-LVM/mychart message to r/s 08/9 appt.

## 2021-08-12 NOTE — Unmapped (Signed)
Tracy Robles, can you check on this referral?

## 2021-08-12 NOTE — Unmapped (Signed)
05/01-lvm/mychart message to r/s 08/9 appt.

## 2021-08-19 ENCOUNTER — Ambulatory Visit: Admit: 2021-08-19 | Discharge: 2021-08-20 | Payer: PRIVATE HEALTH INSURANCE | Attending: Medical | Primary: Medical

## 2021-08-19 DIAGNOSIS — E1165 Type 2 diabetes mellitus with hyperglycemia: Principal | ICD-10-CM

## 2021-08-19 DIAGNOSIS — I10 Essential (primary) hypertension: Principal | ICD-10-CM

## 2021-08-19 DIAGNOSIS — G4730 Sleep apnea, unspecified: Principal | ICD-10-CM

## 2021-08-19 DIAGNOSIS — L409 Psoriasis, unspecified: Principal | ICD-10-CM

## 2021-08-19 LAB — LIPID PANEL
CHOLESTEROL/HDL RATIO SCREEN: 3.1 (ref 1.0–4.5)
CHOLESTEROL: 141 mg/dL (ref <=200)
HDL CHOLESTEROL: 46 mg/dL (ref 40–60)
LDL CHOLESTEROL CALCULATED: 78 mg/dL (ref 40–99)
NON-HDL CHOLESTEROL: 95 mg/dL (ref 70–130)
TRIGLYCERIDES: 84 mg/dL (ref 0–150)
VLDL CHOLESTEROL CAL: 16.8 mg/dL (ref 11–40)

## 2021-08-19 LAB — MAGNESIUM: MAGNESIUM: 2.1 mg/dL (ref 1.6–2.6)

## 2021-08-19 LAB — HEPATIC FUNCTION PANEL
ALBUMIN: 4 g/dL (ref 3.4–5.0)
ALKALINE PHOSPHATASE: 62 U/L (ref 46–116)
ALT (SGPT): 17 U/L (ref 10–49)
AST (SGOT): 19 U/L (ref <=34)
BILIRUBIN DIRECT: 0.2 mg/dL (ref 0.00–0.30)
BILIRUBIN TOTAL: 0.4 mg/dL (ref 0.3–1.2)
PROTEIN TOTAL: 7.5 g/dL (ref 5.7–8.2)

## 2021-08-19 LAB — BASIC METABOLIC PANEL
ANION GAP: 5 mmol/L (ref 5–14)
BLOOD UREA NITROGEN: 12 mg/dL (ref 9–23)
BUN / CREAT RATIO: 13
CALCIUM: 10.2 mg/dL (ref 8.7–10.4)
CHLORIDE: 109 mmol/L — ABNORMAL HIGH (ref 98–107)
CO2: 30.2 mmol/L (ref 20.0–31.0)
CREATININE: 0.95 mg/dL — ABNORMAL HIGH
EGFR CKD-EPI (2021) FEMALE: 71 mL/min/{1.73_m2} (ref >=60)
GLUCOSE RANDOM: 95 mg/dL (ref 70–179)
POTASSIUM: 4.5 mmol/L (ref 3.4–4.8)
SODIUM: 144 mmol/L (ref 135–145)

## 2021-08-19 MED ORDER — MOUNJARO 5 MG/0.5 ML SUBCUTANEOUS PEN INJECTOR
SUBCUTANEOUS | 1 refills | 0 days | Status: CP
Start: 2021-08-19 — End: ?
  Filled 2021-08-22: qty 2, 28d supply, fill #0

## 2021-08-19 MED ORDER — MOUNJARO 2.5 MG/0.5 ML SUBCUTANEOUS PEN INJECTOR
SUBCUTANEOUS | 0 refills | 28 days | Status: CN
Start: 2021-08-19 — End: ?

## 2021-08-19 MED ORDER — TRIAMCINOLONE ACETONIDE 0.1 % TOPICAL CREAM
1 refills | 0 days | Status: CN
Start: 2021-08-19 — End: ?

## 2021-08-23 NOTE — Unmapped (Addendum)
Tolerating Mounajro. Increase 5 mg. She has refills available, but resent this for her. Recheck in 4-6 weeks.    Wt Readings from Last 6 Encounters:   08/19/21 (!) 147.9 kg (326 lb)   08/01/21 (!) 149.7 kg (330 lb)   05/28/21 (!) 150.7 kg (332 lb 5 oz)   05/21/21 (!) 147.4 kg (325 lb)   02/12/21 (!) 144.3 kg (318 lb 1.9 oz)   12/12/20 (!) 147.4 kg (325 lb)

## 2021-08-23 NOTE — Unmapped (Signed)
History of OSA, did not tolerate CPAP, patient needing to get study completed before June.   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

## 2021-08-23 NOTE — Unmapped (Signed)
BP is borderline controlled based on today's blood pressure in office: 136/82Known hx of OSA, does not tolerate CPAP. Plans to follow up for sleep study with [facility]  - Continue amlodipine (Norvasc) and losartan/hydrochlorothiazide (Hyzaar) and spironolactone 25 mg dailyas prescribed.   - Discussed goal is Goal is 130/80.   - Encouraged healthy, low-salt diet and regular exercise.     BP Readings from Last 3 Encounters:   08/19/21 136/82   08/01/21 142/82   07/10/21 137/84     Lab Results   Component Value Date    K 4.5 08/19/2021    K 3.8 05/21/2021    NA 144 08/19/2021    NA 142 05/21/2021    CREATININE 0.95 (H) 08/19/2021    CREATININE 0.95 (H) 05/21/2021

## 2021-08-30 DIAGNOSIS — L409 Psoriasis, unspecified: Principal | ICD-10-CM

## 2021-09-04 NOTE — Unmapped (Signed)
[facility] Refill Coordination Note    Specialty Medication(s) to be Shipped:   Inflammatory Disorders: Cosentyx    Other medication(s) to be shipped: No additional medications requested for fill at this time     Tracy Robles, DOB: 15-Jan-1966  Phone: [phone] (home)       All above HIPAA information was verified with patient.     Was a Nurse, learning disability used for this call? No    Completed refill call assessment today to schedule patient's medication shipment from the [facility] [phone]).  All relevant notes have been reviewed.     Specialty medication(s) and dose(s) confirmed: Regimen is correct and unchanged.   Changes to medications: Tracy Robles reports no changes at this time.  Changes to insurance: No  New side effects reported not previously addressed with a pharmacist or physician: None reported  Questions for the pharmacist: No    Confirmed patient received a Conservation officer, historic buildings and a Surveyor, mining with first shipment. The patient will receive a drug information handout for each medication shipped and additional FDA Medication Guides as required.       DISEASE/MEDICATION-SPECIFIC INFORMATION        For patients on injectable medications: Patient currently has 0 doses left.  Next injection is scheduled for 09/08/21 .    SPECIALTY MEDICATION ADHERENCE     Medication Adherence    Patient reported X missed doses in the last month: 0  Specialty Medication: COSENTYX PEN  Patient is on additional specialty medications: No  Patient is on more than two specialty medications: No  Any gaps in refill history greater than 2 weeks in the last 3 months: no  Demonstrates understanding of importance of adherence: yes              Were doses missed due to medication being on hold? No    cosentyx 150 mg/ml: 0 days of medicine on hand         REFERRAL TO PHARMACIST     Referral to the pharmacist: Not needed      [facility]     Shipping address confirmed in Epic.     Delivery Scheduled: Yes, Expected medication delivery date: 09/06/21.     Medication will be delivered via Same Day Courier to the prescription address in Epic WAM.    Tracy Robles   [facility] Specialty Technician

## 2021-09-06 MED FILL — COSENTYX PEN 300 MG/2 PENS (150 MG/ML) SUBCUTANEOUS: SUBCUTANEOUS | 28 days supply | Qty: 2 | Fill #6

## 2021-09-12 NOTE — Unmapped (Signed)
Name:  Tracy Robles  DOB: Aug 21, 1965  Today's Date: 09/26/2021  Age:  56 y.o.    A/P:  Problem List Items Addressed This Visit          Endocrine    Type 2 diabetes mellitus with hyperglycemia, without long-term current use of insulin (CMS-HCC)     Tolerating Mounajro 5 mg well, reports decreased appetite, and is eating less. Denies any weight loss.    - INCREASE Mounjaro 5 mg weekly --> 7.5 mg weekly. Standard side effects and precautions discussed. If after 4 weeks, patient does not see any weight loss, will increase again to 10 mg weekly.   - Patient will message in 4 weeks if she wishes to increase Mounjaro dose to 10 mg weekly    Wt Readings from Last 6 Encounters:   09/26/21 (!) 149.2 kg (329 lb)   08/19/21 (!) 147.9 kg (326 lb)   08/01/21 (!) 149.7 kg (330 lb)   05/28/21 (!) 150.7 kg (332 lb 5 oz)   05/21/21 (!) 147.4 kg (325 lb)   02/12/21 (!) 144.3 kg (318 lb 1.9 oz)               Cardiovascular and Mediastinum    Primary hypertension - Primary     BP is borderline controlled based on today's blood pressure in office: 145/85. Known hx of OSA, does not tolerate CPAP, but will start using dental device. Patient notes her leg cramps improve when holding losartan-hctz 100-25 mg daily, and she suspects 2/2 losartan as previously experienced cramps when on losartan.   - Continue amlodipine (Norvasc) and spironolactone 25 mg daily as prescribed.   - HOLD losartan-hydrochlorothiazide 100-25 mg daily. START hydrochlorothiazide 25 mg daily.   - If patient's leg cramps improve, and her BP is well controlled will continue this. IF patient continues to feel leg cramps, will stop hydrochlorothiazide and start losartan. Patient aware.   - Patient will message in 4-6 weeks with home BP  - Follow up in 3 months in person.   - Discussed goal is Goal is 130/80.   - Encouraged healthy, low-salt diet and regular exercise.     BP Readings from Last 3 Encounters:   09/26/21 145/85   08/19/21 136/82   08/01/21 142/82     Lab Results   Component Value Date    K 4.5 08/19/2021    K 3.8 05/21/2021    NA 144 08/19/2021    NA 142 05/21/2021    CREATININE 0.95 (H) 08/19/2021    CREATININE 0.95 (H) 05/21/2021             Relevant Medications    hydroCHLOROthiazide (HYDRODIURIL) 25 MG tablet     Medication adherence and barriers to the treatment plan have been addressed. Opportunities to optimize healthy behaviors have been discussed. Patient / caregiver voiced understanding.    Return in about 3 months (around 12/27/2021) for Recheck, htn.    S:  Tracy Robles is a 56 y.o. female who presents for CDM. Last seen 08/19/21 by this PCP. DM2 - increased Mounjaro to 5 mg weekly. She will be using nasal pillow.  Oct 2022 was last diabetic eye exam.     Hypertension   Patient with history of hypertension manages with amlodipine 10 mg daily and losartan-hydrochlorothiazide 100-25 mg daily, and spironolactone 25 mg daily. Patient reports her leg cramps improve when she holds losartan-hydrochlorothiazide 100-25 mg daily. Blood pressure at home 130/85, at triage today 145/89. Currently not taking  losartan/hydrochlorothiazide 100-25 mg. Denies CP, SOB, L/D or peripheral edema.       Obesity   Patient taking Mounjaro 5 mg weekly, increased at last OV. She is tolerating this well, notes she is having increased fullness sensation with Mounjaro. Decreased appetite.      ROS:  A 12 point review of systems was negative except for pertinent items noted in the HPI.    Past medical history, family history, surgical history and social history personally reviewed and verified with patient.     PROBLEM LIST  Patient Active Problem List   Diagnosis    Class 3 severe obesity with body mass index (BMI) of 50.0 to 59.9 in adult (CMS-HCC)    Eczema    Primary hypertension    Psoriasis    Sleep apnea    Type 2 diabetes mellitus with hyperglycemia, without long-term current use of insulin (CMS-HCC)    Encounter to establish care    Back pain    Administrative encounter Ingrown toenail of right foot    Left shoulder pain    Cramp of both lower extremities    Dermatophytosis of foot    Mass of left hand       O:  Vitals:    09/26/21 1454   BP: 145/85   Pulse: 85   Temp: 36.5 ??C (97.7 ??F)   SpO2: 98%     General:  Well appearing, well nourished in no distress.   Skin: no obvious rash or  prominent lesions    Neck: Supple, no obvious JVD  Cardiovascular:  rrr no mrg, no peripheral edema  Eyes:  conjunctiva clear, sclera non-icteric   Ears/nose/throat: no obvious external deformities  Respiratory: CTAB, breathing comfortably  Musculoskeletal:  Normal gait and station.  Neurologic: moves extremities symmetrically, cranial nerves grossly intact  Hematologic: no obvious ecchymosis     Psychiatric:  Oriented X3, intact judgement and insight, normal mood and affect.    I attest that I, Katheran Awe Rindoks, personally documented this note while acting as scribe for [employer], PAC.      Cassidy E Rindoks, Scribe.  09/26/2021     The documentation recorded by the scribe accurately reflects the service I personally performed and the decisions made by me.    Olinda Nola A Samira Acero, PAC

## 2021-09-16 ENCOUNTER — Ambulatory Visit: Admit: 2021-09-16 | Discharge: 2021-09-17 | Payer: PRIVATE HEALTH INSURANCE

## 2021-09-16 DIAGNOSIS — L409 Psoriasis, unspecified: Principal | ICD-10-CM

## 2021-09-16 DIAGNOSIS — M2559 Pain in other joint: Principal | ICD-10-CM

## 2021-09-16 MED ORDER — IXEKIZUMAB 80 MG/ML SUBCUTANEOUS AUTO-INJECTOR
SUBCUTANEOUS | 0 refills | 0.00000 days | Status: CP
Start: 2021-09-16 — End: ?

## 2021-09-16 MED ORDER — TALTZ AUTOINJECTOR 80 MG/ML SUBCUTANEOUS
SUBCUTANEOUS | 6 refills | 28 days | Status: CP
Start: 2021-09-16 — End: ?

## 2021-09-16 NOTE — Unmapped (Signed)
It was nice to see you today! Your resident physician was Dr. Chanette Demo    If any of your medications are too expensive, look for a coupon at GoodRx.com or reference the application on a smartphone  - Enter the medication name, size, and your zip code to find coupons for local pharmacies.   - Print a coupon and bring it to the pharmacy, or pull up the coupon on a smartphone.  - You can also call pharmacies to ask about the cost of your medication before you pick it up.  If you still cannot afford your medication, please let us know.     Please call our clinic at [phone] with any concerns. We look forward to seeing you again!    [facility] releases most results to you as soon as they are available. Therefore, you may see some results before we do. Please give us 2 business days to review the tests and contact you by phone or through MyChart. If you are concerned that some results may be upsetting or confusing, you may wish to wait until we contact you before looking at the report in MyChart.  If you have an urgent question, you can send us a message or call our clinic. Otherwise, we prefer that you wait 2 business days for us to contact you.

## 2021-09-16 NOTE — Unmapped (Signed)
Dermatology Note     Assessment and Plan:      Psoriasis, chronic, flaring and concern for psoriatic arthritis.  BSA 2%. Previously clear with excellent control with Cosentyx 300mg  monthly but now flaring with skin lesions and SI joint pain which is worse in the morning. Also using triamcinolone daily to lesions without improvement. Has failed clobetasol in the past.   Discussed augmenting Cosentyx with UV therapy but unable to make frequent appointments due to job limitations. Methotrexate is not a viable option due to daily alcohol use and baseline creatinine elevations.   - Continue triamcinolone (KENALOG) 0.1% cream. Apply topically to active, raised areas twice daily as needed only.   - referral to rheumatology for evaluation of joint pain  - patient opted to switch biologic medication. Will switch to Taltz 160mg  once then 80mq weeks 2,4,6,8,10,12, then 80mg  q4 weeks. She will continue Cosentyx 300mg  monthly until Altamease Oiler is available.     High risk medication use (Cosentyx, Taltz)  -TB test neg (05/2021)     The patient was advised to call for an appointment should any new, changing, or symptomatic lesions develop.     RTC: Return in about 3 months (around 12/17/2021). or sooner as needed   _________________________________________________________________      Chief Complaint     Chief Complaint   Patient presents with    Rash     Pt not sure if psoriasis or eczema but breaking out to legs, arms and breasts intermittently.  Has been using topical and injection but still breaks out       HPI     Tracy Robles. Leib is a 56 y.o. female who presents as a returning patient (last seen by Dr. Domenic Schwab on 02/12/2021) to [facility] for follow up of psoriasis. At last visit, patient was to continue cosentyx 300 mg every 4 weeks and continue triamcinolone 0.1% cream BID for psoriasis.    Today, she reports that despite continued use of medications and traditionally doing very well she has had flaring of her psoriasis on the arms and legs. She is using topical steroids daily without improvement. She also has worsening SI joint pain. It is variable when it flares but does tend to be worse in the mornings. Part of the SI joint pain is worse on the left side, which she attributes to known sciatica. She drinks alcohol 1-2 drinks daily.     The patient denies any other new or changing lesions or areas of concern.     Pertinent Past Medical History     No history of skin cancer  Diabetes  Psoriasis with consistent biopsy on 01/12/2018  Onychomycosis with clipping consistent from 11/09/2018    Social History:  She is a bus Hospital doctor for town [place]  Husband passed away suddenly in 12/29/19    Past Medical History, Family History, Social History, Medication List, Allergies, and Problem List were reviewed in the rooming section of Epic.     ROS: Other than symptoms mentioned in the HPI, no fevers, chills, or other skin complaints    Physical Examination     GENERAL: Well-appearing female in no acute distress, resting comfortably.  NEURO: Alert and oriented, answers questions appropriately  PSYCH: Normal mood and affect  SKIN: Examination of the back was performed  - scattered erythematous and scaly papules on the b/l legs and arms    All areas not commented on are within normal limits or unremarkable    (Approved Template 12/26/2019)

## 2021-09-23 MED FILL — MOUNJARO 5 MG/0.5 ML SUBCUTANEOUS PEN INJECTOR: SUBCUTANEOUS | 28 days supply | Qty: 2 | Fill #1

## 2021-09-26 ENCOUNTER — Ambulatory Visit: Admit: 2021-09-26 | Discharge: 2021-09-27 | Payer: PRIVATE HEALTH INSURANCE | Attending: Medical | Primary: Medical

## 2021-09-26 DIAGNOSIS — I10 Essential (primary) hypertension: Principal | ICD-10-CM

## 2021-09-26 DIAGNOSIS — E1165 Type 2 diabetes mellitus with hyperglycemia: Principal | ICD-10-CM

## 2021-09-26 MED ORDER — MOUNJARO 10 MG/0.5 ML SUBCUTANEOUS PEN INJECTOR
SUBCUTANEOUS | 2 refills | 0 days | Status: CP
Start: 2021-09-26 — End: 2021-09-26
  Filled 2021-10-02: qty 2, 28d supply, fill #0

## 2021-09-26 MED ORDER — HYDROCHLOROTHIAZIDE 25 MG TABLET
ORAL_TABLET | Freq: Every day | ORAL | 3 refills | 90 days | Status: CP
Start: 2021-09-26 — End: 2022-09-26
  Filled 2021-10-02: qty 90, 90d supply, fill #0

## 2021-09-26 MED ORDER — TIRZEPATIDE 7.5 MG/0.5 ML SUBCUTANEOUS PEN INJECTOR
SUBCUTANEOUS | 2 refills | 0 days | Status: CP
Start: 2021-09-26 — End: ?

## 2021-09-26 NOTE — Unmapped (Addendum)
BP is borderline controlled based on today's blood pressure in office: 145/85. Known hx of OSA, does not tolerate CPAP, but will start using dental device. Patient notes her leg cramps improve when holding losartan-hctz 100-25 mg daily, and she suspects 2/2 losartan as previously experienced cramps when on losartan.   - Continue amlodipine (Norvasc) and spironolactone 25 mg daily as prescribed.   - HOLD losartan-hydrochlorothiazide 100-25 mg daily. START hydrochlorothiazide 25 mg daily.   - If patient's leg cramps improve, and her BP is well controlled will continue this. IF patient continues to feel leg cramps, will stop hydrochlorothiazide and start losartan. Patient aware.   - Patient will message in 4-6 weeks with home BP  - Follow up in 3 months in person.   - Discussed goal is Goal is 130/80.   - Encouraged healthy, low-salt diet and regular exercise.     BP Readings from Last 3 Encounters:   09/26/21 145/85   08/19/21 136/82   08/01/21 142/82     Lab Results   Component Value Date    K 4.5 08/19/2021    K 3.8 05/21/2021    NA 144 08/19/2021    NA 142 05/21/2021    CREATININE 0.95 (H) 08/19/2021    CREATININE 0.95 (H) 05/21/2021

## 2021-09-26 NOTE — Unmapped (Addendum)
Tolerating Mounajro 5 mg well, reports decreased appetite, and is eating less. Denies any weight loss.    - INCREASE Mounjaro 5 mg weekly --> 7.5 mg weekly. Standard side effects and precautions discussed. If after 4 weeks, patient does not see any weight loss, will increase again to 10 mg weekly.   - Patient will message in 4 weeks if she wishes to increase Mounjaro dose to 10 mg weekly    Wt Readings from Last 6 Encounters:   09/26/21 (!) 149.2 kg (329 lb)   08/19/21 (!) 147.9 kg (326 lb)   08/01/21 (!) 149.7 kg (330 lb)   05/28/21 (!) 150.7 kg (332 lb 5 oz)   05/21/21 (!) 147.4 kg (325 lb)   02/12/21 (!) 144.3 kg (318 lb 1.9 oz)

## 2021-09-30 NOTE — Unmapped (Signed)
[facility] Refill Coordination Note    Patient is aware that Altamease Oiler PA was denied.    Specialty Medication(s) to be Shipped:   Inflammatory Disorders: Cosentyx    Other medication(s) to be shipped: HCTZ and Mounjaro 7.5mg /0.58ml     [employer], DOB: January 03, 1966  Phone: There are no phone numbers on file.      All above HIPAA information was verified with patient.     Was a Nurse, learning disability used for this call? No    Completed refill call assessment today to schedule patient's medication shipment from the [facility] [phone]).  All relevant notes have been reviewed.     Specialty medication(s) and dose(s) confirmed: Regimen is correct and unchanged.   Changes to medications: Sabra reports no changes at this time.  Changes to insurance: No  New side effects reported not previously addressed with a pharmacist or physician: None reported  Questions for the pharmacist: No    Confirmed patient received a Conservation officer, historic buildings and a Surveyor, mining with first shipment. The patient will receive a drug information handout for each medication shipped and additional FDA Medication Guides as required.       DISEASE/MEDICATION-SPECIFIC INFORMATION        For patients on injectable medications: Patient currently has 0 doses left.  Next injection is scheduled for 10/09/21 .    SPECIALTY MEDICATION ADHERENCE     Medication Adherence    Patient reported X missed doses in the last month: 0  Specialty Medication: Cosentyx  Patient is on additional specialty medications: No  Any gaps in refill history greater than 2 weeks in the last 3 months: no  Demonstrates understanding of importance of adherence: yes  Informant: patient  Reliability of informant: reliable  Confirmed plan for next specialty medication refill: delivery by pharmacy  Refills needed for supportive medications: not needed              Were doses missed due to medication being on hold? No    cosentyx 150 mg/ml: 0 days of medicine on hand         REFERRAL TO PHARMACIST     Referral to the pharmacist: Not needed      [facility]     Shipping address confirmed in Epic.     Delivery Scheduled: Yes, Expected medication delivery date: 10/02/2021.     Medication will be delivered via Same Day Courier to the prescription address in Epic WAM.    Geddy Boydstun D Cobin Cadavid   [facility] Specialty Technician

## 2021-10-02 MED FILL — COSENTYX PEN 300 MG/2 PENS (150 MG/ML) SUBCUTANEOUS: SUBCUTANEOUS | 28 days supply | Qty: 2 | Fill #7

## 2021-10-17 ENCOUNTER — Ambulatory Visit: Payer: BC Managed Care – PPO | Admitting: Podiatry

## 2021-11-06 NOTE — Unmapped (Signed)
[name] Refill Coordination Note    Patient is aware that Altamease Oiler appeal was denied.    Specialty Medication(s) to be Shipped:   Inflammatory Disorders: Cosentyx    Other medication(s) to be shipped: [facility] 7.5mg /0.1ml     Tracy Robles, DOB: 1965-06-09  Phone: There are no phone numbers on file.      All above HIPAA information was verified with patient.     Was a Nurse, learning disability used for this call? No    Completed refill call assessment today to schedule patient's medication shipment from the [facility] [phone]).  All relevant notes have been reviewed.     Specialty medication(s) and dose(s) confirmed: Regimen is correct and unchanged.   Changes to medications: [name] reports no changes at this time.  Changes to insurance: No  New side effects reported not previously addressed with a pharmacist or physician: None reported  Questions for the pharmacist: No    Confirmed patient received a Conservation officer, historic buildings and a Surveyor, mining with first shipment. The patient will receive a drug information handout for each medication shipped and additional FDA Medication Guides as required.       DISEASE/MEDICATION-SPECIFIC INFORMATION        For patients on injectable medications: Patient currently has 0 doses left.  Next injection is scheduled for 11/08/21 .    SPECIALTY MEDICATION ADHERENCE     Medication Adherence    Patient reported X missed doses in the last month: 0  Specialty Medication: COSENTYX  Patient is on additional specialty medications: No  Any gaps in refill history greater than 2 weeks in the last 3 months: no  Demonstrates understanding of importance of adherence: yes  Informant: patient  Reliability of informant: reliable  Confirmed plan for next specialty medication refill: delivery by pharmacy  Refills needed for supportive medications: not needed              Were doses missed due to medication being on hold? No    cosentyx 150 mg/ml: 0 days of medicine on hand         REFERRAL TO PHARMACIST     Referral to the pharmacist: Not needed      [facility]     Shipping address confirmed in Epic.     Delivery Scheduled: Yes, Expected medication delivery date: 11/08/2021.     Medication will be delivered via Same Day Courier to the prescription address in Epic WAM.    Tracy Robles   [facility] Specialty Technician

## 2021-11-08 MED FILL — COSENTYX PEN 300 MG/2 PENS (150 MG/ML) SUBCUTANEOUS: SUBCUTANEOUS | 28 days supply | Qty: 2 | Fill #8

## 2021-11-08 MED FILL — MOUNJARO 7.5 MG/0.5 ML SUBCUTANEOUS PEN INJECTOR: SUBCUTANEOUS | 28 days supply | Qty: 2 | Fill #1

## 2021-11-29 NOTE — Unmapped (Signed)
[facility] Pharm[facility] Refill Coordination Note    Specialty Medication(s) to be Shipped:   Inflammatory Disorders: Cosentyx    Other medication(s) to be shipped: [facility] 7.5mg /0.73ml     Tracy Robles, DOB: Dec 25, 1965  Phone: There are no phone numbers on file.      All above HIPAA information was verified with patient.     Was a Nurse, learning disability used for this call? No    Completed refill call assessment today to schedule patient's medication shipment from the [facility] [phone]).  All relevant notes have been reviewed.     Specialty medication(s) and dose(s) confirmed: Regimen is correct and unchanged.   Changes to medications: Andreina reports no changes at this time.  Changes to insurance: No  New side effects reported not previously addressed with a pharmacist or physician: None reported  Questions for the pharmacist: No    Confirmed patient received a Conservation officer, historic buildings and a Surveyor, mining with first shipment. The patient will receive a drug information handout for each medication shipped and additional FDA Medication Guides as required.       DISEASE/MEDICATION-SPECIFIC INFORMATION        For patients on injectable medications: Patient currently has 0 doses left.  Next injection is scheduled for 12/09/21.    SPECIALTY MEDICATION ADHERENCE     Medication Adherence    Patient reported X missed doses in the last month: 0  Specialty Medication: Cosentyx  Patient is on additional specialty medications: No  Informant: patient                          Were doses missed due to medication being on hold? No    Cosentyx 150 mg/ml: 0 days of medicine on hand     REFERRAL TO PHARMACIST     Referral to the pharmacist: Not needed      [facility]     Shipping address confirmed in Epic.     Delivery Scheduled: Yes, Expected medication delivery date: 12/03/21.     Medication will be delivered via Same Day Courier to the prescription address in Epic WAM.    Jasper Loser   [facility] Specialty Technician

## 2021-12-03 MED FILL — COSENTYX PEN 300 MG/2 PENS (150 MG/ML) SUBCUTANEOUS: SUBCUTANEOUS | 28 days supply | Qty: 2 | Fill #9

## 2021-12-03 MED FILL — MOUNJARO 7.5 MG/0.5 ML SUBCUTANEOUS PEN INJECTOR: SUBCUTANEOUS | 28 days supply | Qty: 2 | Fill #2

## 2021-12-12 NOTE — Unmapped (Signed)
Patient called in to provide a report after her visit with the sleep clinic. Patient reports that her weight is down to 308 and a  bp reading of 129/82 as of 12/12/21.    Patient requested to have the information forwarded to her pcp.

## 2021-12-30 ENCOUNTER — Ambulatory Visit: Admit: 2021-12-30 | Discharge: 2021-12-31 | Payer: PRIVATE HEALTH INSURANCE | Attending: Medical | Primary: Medical

## 2021-12-30 DIAGNOSIS — Z23 Encounter for immunization: Principal | ICD-10-CM

## 2021-12-30 DIAGNOSIS — E1165 Type 2 diabetes mellitus with hyperglycemia: Principal | ICD-10-CM

## 2021-12-30 DIAGNOSIS — I10 Essential (primary) hypertension: Principal | ICD-10-CM

## 2021-12-30 DIAGNOSIS — Z6841 Body Mass Index (BMI) 40.0 and over, adult: Principal | ICD-10-CM

## 2021-12-30 MED ORDER — TIRZEPATIDE 7.5 MG/0.5 ML SUBCUTANEOUS PEN INJECTOR
SUBCUTANEOUS | 2 refills | 28 days | Status: CP
Start: 2021-12-30 — End: ?
  Filled 2022-01-07: qty 2, 28d supply, fill #0

## 2021-12-30 MED ORDER — LOSARTAN 50 MG TABLET
ORAL_TABLET | Freq: Every day | ORAL | 3 refills | 90 days | Status: CP
Start: 2021-12-30 — End: 2022-12-30
  Filled 2022-01-07: qty 90, 90d supply, fill #0

## 2021-12-30 MED ORDER — MOUNJARO 7.5 MG/0.5 ML SUBCUTANEOUS PEN INJECTOR
SUBCUTANEOUS | 2 refills | 28 days | Status: CN
Start: 2021-12-30 — End: ?

## 2021-12-30 NOTE — Unmapped (Addendum)
BP is borderline controlled based on today's blood pressure in office: 133/82. No checking home BP. Known hx of OSA, retrying CPAP. Pt requested to stop losartan last OV 2/2 leg cramps she associated with losartan. Continues to have occasional leg cramps. Discussed restarting losartan or ARB given preferred HTN agent with comorbid DM2   - Continue amlodipine (Norvasc) 10 mg once daily, hydrochlorothiazide 25 mg once daily and spironolactone 25 mg daily as prescribed.   -Agreeable to retrying losartan at lower dose. START losartan 50 mg every day. Future order to recheck BMP in ~3 weeks placed  - Discussed goal is Goal is 130/80.   - Encouraged healthy, low-salt diet and regular exercise.     Meds tried- lisinopril    BP Readings from Last 3 Encounters:   12/30/21 133/83   09/26/21 145/85   08/19/21 136/82     Lab Results   Component Value Date    K 4.5 08/19/2021    K 3.8 05/21/2021    NA 144 08/19/2021    NA 142 05/21/2021    CREATININE 0.95 (H) 08/19/2021    CREATININE 0.95 (H) 05/21/2021

## 2021-12-30 NOTE — Unmapped (Addendum)
16 lb wt loss since last OV. Having some diarrhea associated with certain foods since starting mounjaro. Has not tried topiramate (no hx of nephrolithiasis). Appt with [facility] next week. Tolerating Mounjaro 7.5 mg weekly ok, some diarrhea and some days has very little appetite and won't eat much. Discussed smaller more frequent meals. Reports she has decreased processed snacks and sweets. Drinking most water, occasionally arnold palmer. Walking some, encouraged to try to walk more btw training- aiming 10 min 4 times and working up to longer periods.   -Will try to reconnect with RD- referral placed again  - Meds tried: wegovy (lost insurance covered)  ozempic (no weight loss), metformin (diarrhea even on lower doses affecting ability to do job)      Today???s BP: 133/83. Current BMI: (!) 56.3.  Wt Readings from Last 4 Encounters:   12/30/21 (!) 142 kg (313 lb)   09/26/21 (!) 149.2 kg (329 lb)   08/19/21 (!) 147.9 kg (326 lb)   08/01/21 (!) 149.7 kg (330 lb)

## 2021-12-30 NOTE — Unmapped (Signed)
Patient in clinic for office visit, FluLaval IIV4 Pres-Free vaccine administered per protocol,NCIR reviewed and vaccine accuracy verified with teammates: Valene Bors, CMA, patient eligible to receive vacstock: Private Vaccine, vaccine administered Right Deltoid, pt tolerated well, no s/s of a reaction noted, VIS given to patient

## 2021-12-30 NOTE — Unmapped (Addendum)
Diabetes is at goal. Today's A1C 5.8,  Continued on mounjaro 7.5 mg weekly. 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 Switched to [facility] to see if this helped with weight loss.   Lab Results   Component Value Date    Hemoglobin A1C 5.9 08/19/2021      DM Check list:  Foot Exam: 02/12/21  Retinal Exam: 01/30/20 per records. Pt reports eye exam at [facility] within last yaer  Flu shot: 12/30/21  Pneumovax: 11/18/18. Prevnar20: 02/12/21  Baseline EKG: none on file at [facility] [place] without acute ST changes noted in [facility] records 07/01/18  Microalbumin: losartan stopped last OV per pt preference 2/2 leg cramps  Lab Results   Component Value Date    Albumin/Creatinine Ratio  02/12/2021      Comment:      Unable to calculate.     LDL: on simvastatin 20 mg qd  Lab Results   Component Value Date    LDL Calculated 78 08/19/2021

## 2021-12-30 NOTE — Unmapped (Signed)
Name:  Tracy Robles  DOB: 04-09-1966  Today's Date: 12/30/2021  Age:  56 y.o.    A/P:  Problem List Items Addressed This Visit       Class 3 severe obesity with body mass index (BMI) of 50.0 to 59.9 in adult (CMS-HCC)     16 lb wt loss since last OV. Having some diarrhea associated with certain foods since starting mounjaro. Has not tried topiramate (no hx of nephrolithiasis). Appt with [facility] next week. Tolerating Mounjaro 7.5 mg weekly ok, some diarrhea and some days has very little appetite and won't eat much. Discussed smaller more frequent meals. Reports she has decreased processed snacks and sweets. Drinking most water, occasionally arnold palmer. Walking some, encouraged to try to walk more btw training- aiming 10 min 4 times and working up to longer periods.   -Will try to reconnect with RD- referral placed again  - Meds tried: wegovy (lost insurance covered)  ozempic (no weight loss), metformin (diarrhea even on lower doses affecting ability to do job)      Today???s BP: 133/83. Current BMI: (!) 56.3.  Wt Readings from Last 4 Encounters:   12/30/21 (!) 142 kg (313 lb)   09/26/21 (!) 149.2 kg (329 lb)   08/19/21 (!) 147.9 kg (326 lb)   08/01/21 (!) 149.7 kg (330 lb)            Relevant Orders    Amb Referral to Diabetes Ed/Med Nutr The    Primary hypertension     BP is borderline controlled based on today's blood pressure in office: 133/82. No checking home BP. Known hx of OSA, retrying CPAP. Pt requested to stop losartan last OV 2/2 leg cramps she associated with losartan. Continues to have occasional leg cramps. Discussed restarting losartan or ARB given preferred HTN agent with comorbid DM2   - Continue amlodipine (Norvasc) 10 mg once daily, hydrochlorothiazide 25 mg once daily and spironolactone 25 mg daily as prescribed.   -Agreeable to retrying losartan at lower dose. START losartan 50 mg every day. Future order to recheck BMP in ~3 weeks placed  - Discussed goal is Goal is 130/80.   - Encouraged healthy, low-salt diet and regular exercise.     Meds tried- lisinopril    BP Readings from Last 3 Encounters:   12/30/21 133/83   09/26/21 145/85   08/19/21 136/82     Lab Results   Component Value Date    K 4.5 08/19/2021    K 3.8 05/21/2021    NA 144 08/19/2021    NA 142 05/21/2021    CREATININE 0.95 (H) 08/19/2021    CREATININE 0.95 (H) 05/21/2021             Relevant Medications    losartan (COZAAR) 50 MG tablet    Other Relevant Orders    Basic Metabolic Panel    Type 2 diabetes mellitus with hyperglycemia, without long-term current use of insulin (CMS-HCC) - Primary     Diabetes is at goal. Today's A1C 5.8,  Continued on mounjaro 7.5 mg weekly. 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 Switched to [facility] to see if this helped with weight loss.   Lab Results   Component Value Date    Hemoglobin A1C  5.9 08/19/2021      DM Check list:  Foot Exam: 02/12/21  Retinal Exam: 01/30/20 per records. Pt reports eye exam at [facility] within last yaer  Flu shot: 12/30/21  Pneumovax: 11/18/18. Prevnar20: 02/12/21  Baseline EKG: none on file at [facility], [place] without acute ST changes noted in [facility] records 07/01/18  Microalbumin: losartan stopped last OV per pt preference 2/2 leg cramps  Lab Results   Component Value Date    Albumin/Creatinine Ratio  02/12/2021      Comment:      Unable to calculate.   LDL: on simvastatin 20 mg qd  Lab Results   Component Value Date    LDL Calculated 78 08/19/2021          Relevant Medications    tirzepatide 7.5 mg/0.5 mL PnIj    Other Relevant Orders    POCT glycosylated hemoglobin (Hb A1C) (Completed)     Other Visit Diagnoses       Flu vaccine need        Relevant Orders    INFLUENZA VACCINE (QUAD) IM - 6 MO-ADULT - PF (Completed)            Medication adherence and barriers to the treatment plan have been addressed. Opportunities to optimize healthy behaviors have been discussed. Patient / caregiver voiced understanding.      Return in about 3 months (around 03/31/2022) for Recheck. Prevnar 20    S:  Tracy Robles is a 56 y.o. female who presents for HTN and DM2 as well as class 3 obesity follow up    HTN  Stopped losartan 2/2 leg cramps per pt preference (reports previously caused leg cramps)  Continued on amlodipine 10 mg every day, spironolactone 25 mg every day       2. DM2  Managed with mounjaro 7.5 mg daily - overall tolerating ok  Some diarrhea with certain foods, hasn't eaten all day today  Lab Results   Component Value Date    A1C 5.8 12/30/2021                        ROS:  A 12 point review of systems was negative except for pertinent items noted in the HPI.    Past medical history, family history, surgical history and social history personally reviewed and verified with patient.     PROBLEM LIST  Patient Active Problem List   Diagnosis    Class 3 severe obesity with body mass index (BMI) of 50.0 to 59.9 in adult (CMS-HCC)    Eczema    Primary hypertension    Psoriasis    Sleep apnea    Type 2 diabetes mellitus with hyperglycemia, without long-term current use of insulin (CMS-HCC)    Encounter to establish care    Back pain    Administrative encounter    Ingrown toenail of right foot    Left shoulder pain    Cramp of both lower extremities    Dermatophytosis of foot    Mass of left hand       O:  Vitals:    12/30/21 1312   BP: 133/83   Pulse: 76   Temp: 36.7 ??C (98 ??F)     General:  Well appearing, well nourished in no distress.   Skin: no obvious rash or  prominent lesions    Neck: Supple, no obvious JVD  Cardiovascular:  rrr no mrg, no peripheral edema  Eyes:  conjunctiva clear, sclera  non-icteric   Ears/nose/throat: no obvious external deformities  Respiratory: CTAB, breathing comfortably  Musculoskeletal:  Normal gait and station.  Neurologic: moves extremities symmetrically, cranial nerves grossly intact  Hematologic: no obvious ecchymosis     Psychiatric:  Oriented X3, intact judgement and insight, normal mood and affect.      Edelyn Heidel A Anatasia Tino, PAC

## 2022-01-03 NOTE — Unmapped (Signed)
[facility] Refill Coordination Note    Specialty Medication(s) to be Shipped:   Inflammatory Disorders: Cosentyx    Other medication(s) to be shipped:  Mounjaro 7.5mg /0.26ml, hydrochlorothiazide and Losartan     Tracy Robles, DOB: [birth year]  Phone: There are no phone numbers on file.      All above HIPAA information was verified with patient.     Was a Nurse, learning disability used for this call? No    Completed refill call assessment today to schedule patient's medication shipment from the [facility] [phone]).  All relevant notes have been reviewed.     Specialty medication(s) and dose(s) confirmed: Regimen is correct and unchanged.   Changes to medications: Sidne reports stopping the following medications: Metformin  Changes to insurance: No  New side effects reported not previously addressed with a pharmacist or physician: None reported  Questions for the pharmacist: No    Confirmed patient received a Conservation officer, historic buildings and a Surveyor, mining with first shipment. The patient will receive a drug information handout for each medication shipped and additional FDA Medication Guides as required.       DISEASE/MEDICATION-SPECIFIC INFORMATION        For patients on injectable medications: Patient currently has 0 doses left.  Next injection is scheduled for 01/09/22.    SPECIALTY MEDICATION ADHERENCE     Medication Adherence    Patient reported X missed doses in the last month: 0  Specialty Medication: Cosentyx 150 mg/mL  Patient is on additional specialty medications: No  Any gaps in refill history greater than 2 weeks in the last 3 months: no  Demonstrates understanding of importance of adherence: yes  Informant: patient  Reliability of informant: reliable                  Confirmed plan for next specialty medication refill: delivery by pharmacy  Refills needed for supportive medications: not needed     Were doses missed due to medication being on hold? No    Cosentyx 150 mg/ml: 0 days of medicine on hand     REFERRAL TO PHARMACIST     Referral to the pharmacist: Not needed      [facility]     Shipping address confirmed in Epic.     Delivery Scheduled: Yes, Expected medication delivery date: 01/07/22.     Medication will be delivered via Same Day Courier to the prescription address in Epic [place].    Tracy Robles Tracy Robles   [facility] Specialty Technician

## 2022-01-07 MED FILL — HYDROCHLOROTHIAZIDE 25 MG TABLET: ORAL | 90 days supply | Qty: 90 | Fill #1

## 2022-01-07 MED FILL — COSENTYX PEN 300 MG/2 PENS (150 MG/ML) SUBCUTANEOUS: SUBCUTANEOUS | 28 days supply | Qty: 2 | Fill #10

## 2022-01-07 NOTE — Unmapped (Signed)
[facility]    Assessment/Plan:     Problem List Items Addressed This Visit          Unprioritized    Class 3 severe obesity with body mass index (BMI) of 50.0 to 59.9 in adult (CMS-HCC)     Tracy Robles is a 56 y.o. female with Class 3 Obesity, [place] Obesity Staging System (EOSS) Stage 2 obesity due to genetic predisposition due to family history, physical inactivity, and frequent consumption of ultraprocessed foods, sleep disruption (waking up at 3 am for work).     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    GOALS: 1) Start doing water aerobics with cousin, add walking and light dumbbells while working  2) Increase fiber through fruits and veggies at every meal  3) Increase Mounjaro  4) Swap yoplait yogurt to chobani  5) Decrease occasional sodas    I have reviewed the patient's medical history, lifestyle history and labs/tests.   My recommendations include the following:    Medication: Weight trending favorably on Mounjaro 7.5 mg. Pt has no copay. Averaging 1.2 lb loss per week (~19 lb loss in ~16 weeks.) Discussed and decided to increase current dose of Mounjaro. Rx sent to [facility]. Pt verbalized understanding and agreement with plan.    INCREASE Mounjaro 7.5 mg --> 10 mg once weekly injections  Current:  - Mounjaro  Tried:  - fen-phen  - Wegovy   - Ozempic  - Metformin  Contraindicated:  - phentermine: elevated BP      Eating Pattern: Pt has variable eating pattern that includes cooking at home and packing lunch as well as occasionally eating fast food. We discussed sources of added sugar or excess calories such as sodas and eating out. Also encouraged Pt to eat 2 nutrient dense meals per day (currently skipping meals and snacking.)    Physical Activity: No current routine. Pt walks to and from workplace daily. Pt plans to start doing water aerobics.     Stress: Pt has some good coping mechanisms to deal with stressors including doing puzzles, playing games on phone or making jewelry.     Sleep: Pt has OSA. Sleeping 4 hours with CPAP. Wakes up at 3 am. Takes sister to work at 10 pm then goes to to sleep.     Obesity Surgery: Pt adamantly declines surgery. Pt is not interested in surgery due to hearing about other people having difficulty post-surgery. Pt also expresses concern about caring for mother and that her sister will not assist. (Updated 01/08/22)    Medical conditions:  Glaucoma: no  Seizures: no  Medullary thyroid cancer (personal or family hx): no  Multiple Endocrine Neoplasia: no  Palpitations/Tachycardia: no  Chest Pain: no  Headaches/Migraines: no  Nephrolithiasis: no  H/o pancreatitis: no  GERD: no    Prior Surgeries:  Cholecystectomy: no    Birth Control Methods: Post menopause    Liver Health Screening:  Fib-4 Score: 1.00--Cirrhosis less likely.         Type 2 diabetes mellitus with hyperglycemia, without long-term current use of insulin (CMS-HCC) - Primary     Reviewed relevant medications and labs. Pt is following our [facility] Management Clinic. See Obesity tab for medication changes.  Relevant Medications    tirzepatide (MOUNJARO) 10 mg/0.5 mL PnIj        Return in about 2 months (around 03/10/2022) for Weight clinic F/U one slot. dietician, every two months with me..    Counseling     I spent a total of 60 minutes face-to-face with the patient, of which greater than 50% was spent counseling/coordinating care regarding obesity and management of above comorbidities. All questions answered. I have reviewed pt's lab results and medical records.    HPI:     Tracy Robles is a 56 y.o. female Body mass index is 55.42 kg/m??. Waist Circumference: 50 inches Neck Circumference: 16; seen at the request of Tester, [employer], *    Motivations and goals      07/10/2021 8:53 PM   Intake Questions   What is your main reason for obesity treatment? My doctor recommended weight loss prior to a procedure   Overall goal - A.) I would like to get down to _____ lbs. B.) I do not have a weight goal. I mainly want to feel better 328        Weight History:      07/10/2021     8:53 PM   Weight History   What was your highest adult weight? (lbs) 385   What was your lowest adult weight on a diet or weight loss program? (lbs) 318   What was your lowest usual lowest adult weight? (lbs) 318   Did you notice that you gained weight during any of the following circumstances: working 2nd or 3rd shift, when you quit smoking, using some medications No   How much total weight did you gain with all your pregnancies that you were unable to lose after giving birth? 0   How much weight did you gain around the time you went through menopause? 0          Weight Hx: Normal birth and childhood weight. Began gaining weight after puberty and has fluctuated a lot since then. Weighed >300 lbs in 30s and 40s. Weight gain has been gradual since childhood.    Family Hx: Strong family history on both sides (grandparents and uncles). Has 3 cousins that had bariatric surgery (some on both sides of family). Pt has 2 siblings who have ?overweight/obesity but not to the same severity of Pt.     Physical Activity: No formal routine. Pt walks between transit building and bus and car daily which is 10-20 mins.    Social: Widow, lost 2 children to incomplete pregnancies when weighing 385 lbs. Mother lives with her. 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 No cigarettes. Occ alcohol. Work hours vary, but currently working 5 am- 2pm (though sometimes gets off as late as 8 pm.) Works M-F. Sister lives in the house across from her.    Dietary:     Eating Pattern: Stops eating at 8 pm. Pt has been trying to pack a lunch for work (sandwich). She is limited by timing due to not being able to eat on the bus. Greggory Keen has made foods less palatable. Snacks would include beef jerky, chips, peanuts or sunflower seeds. Pt has an appt with our dietician scheduled for January 10th, 2024.    Pt doesn't like eggs and doesn't like many fruits. Says grapes and pineapple cause indigestion. Pt enjoys strawberry and cherry yogurt.    B: typically skips  L: sometimes packing a sandwich, chikfila and getting nuggets w/fries (1x/week), shared home  cooked by coworkers: hamburger (no bun), chicken wings  D: sometimes skips, sometimes getting chinese food (eating part of meal)    Beverages: Drinking 40 oz water per day. Getting soda (coke, pepsi or Mt. Dew) once a week.     Dieting History:      07/10/2021     8:53 PM   Diet History   Are you currently working with a Registered Dietitian? No   Dinner Chicken wings   Do you frequently feel hungry within 2 hours of having a regular size meal? No   Do you eat at times when you are not hungry? No   Do you eat for comfort when you are stressed or emotional? No   Are there times when you eat and it feels like you can't stop yourself from eating? No   Do you try to manage your weight by vomiting, using laxatives, diuretics, or excessive exercise? No   Do you sometimes find food on your bed which you do not remember eating? No   Do you eat late at night? No   Please list foods that you eat frequently Chicken,  Chinese   Do you exercise regularly? No        Physical Activity:         07/10/2021     8:53 PM   Physical Activity History   Do you exercise regularly? No      Medications:     Current medications with potential to cause weight gain:yes, Gabapentin (PRN)    Sleep:         07/10/2021     8:53 PM   Sleep History   Have you been diagnosed with Obstructive Sleep Apnea (OSA)? Yes   Do you currently use a CPAP or other device for OSA? No   STOP-BANG Assesment My age is >50   STOP-BANG Score 1        Stress:         07/10/2021     8:53 PM   Stress History   On a scale of 1 to 10, how stressed were you in the past year? 7   What do you do to manage your stress? Look at tv        Mental Health History(emotional health):     Any thoughts about harming yourself or wanting to die: no  Engaged in any self harming behaviors e.g. cutting yourself : no  Have you been to the ER or hospitalized for mental health reasons :no  Any alcohol or substance abuse, including prescription abuse : no    Past Medical and Surgical History:     Active Ambulatory Problems     Diagnosis Date Noted    Class 3 severe obesity with body mass index (BMI) of 50.0 to 59.9 in adult (CMS-HCC) 10/06/2016    Eczema 10/06/2016    Primary hypertension 09/30/2016    Psoriasis 10/06/2016    Sleep apnea 09/30/2016    Type 2 diabetes mellitus with hyperglycemia, without long-term current use of insulin (CMS-HCC) 02/04/2018    Encounter to establish care 11/17/2018    Back pain 11/18/2018    Administrative encounter 02/10/2019    Ingrown toenail of right foot 11/23/2019    Left shoulder pain 06/07/2020    Cramp of both lower extremities 09/06/2020    Dermatophytosis of foot 10/17/2010    Mass of left hand 02/12/2021     Resolved Ambulatory Problems     Diagnosis Date Noted  No Resolved Ambulatory Problems     Past Medical History:   Diagnosis Date    Allergic Don't know    Arthritis 2011    BMI 60.0-69.9, adult (CMS-HCC) 05/29/2021    Diabetes mellitus (CMS-HCC)     High blood pressure        No past surgical history on file.    Patients previous medical records from [facility] reviewed and summarized in the Obesity Medicine Evaluation Summary     Medication list:       Current Outpatient Medications:     amLODIPine (NORVASC) 10 MG tablet, Take 1 tablet (10 mg total) by mouth daily., Disp: 90 tablet, Rfl: 3    blood sugar diagnostic (GLUCOSE BLOOD) Strp, Check blood sugar as directed once a day and for symptoms of high or low blood sugar., Disp: 100 strip, Rfl: 3    blood-glucose meter kit, Use as instructed, Disp: 1 each, Rfl: 0    clobetasoL (TEMOVATE) 0.05 % external solution, Apply topically to scalp twice daily., Disp: 50 mL, Rfl: 6    clobetasol (TEMOVATE) 0.05 % ointment, Apply topically to rash on body twice daily., Disp: 60 g, Rfl: 6    gabapentin (NEURONTIN) 300 MG capsule, Take 2 capsules (600 mg total) by mouth two (2) times a day., Disp: 360 capsule, Rfl: 3    hydroCHLOROthiazide (HYDRODIURIL) 25 MG tablet, Take 1 tablet (25 mg total) by mouth daily., Disp: 90 tablet, Rfl: 3    ixekizumab (TALTZ AUTOINJECTOR) 80 mg/mL AtIn auto-injector, Inject the contents of 1 auto-injector (80 mg total) under the skin every twenty-eight (28) days., Disp: 1 mL, Rfl: 6    ixekizumab (TALTZ) 80 mg/mL AtIn auto-injector, Inject the contents of 2 auto-injectors (160 mg total) once, followed by injecting the contents of 1 auto-injector (80 mg total) at weeks 2, 4, 6, 8, 10 and 12 for loading dose. Then 80 mg every 4 weeks., Disp: 8 mL, Rfl: 0    loratadine (CLARITIN) 10 mg tablet, Take 1 tablet (10 mg total) by mouth daily as needed for allergies., Disp: , Rfl:     losartan (COZAAR) 50 MG tablet, Take 1 tablet (50 mg total) by mouth daily., Disp: 90 tablet, Rfl: 3    multivit with min-folic acid (MULTIVITAMIN GUMMIES) 200 mcg Chew, Chew., Disp: , Rfl:     ONETOUCH DELICA PLUS LANCET 33 gauge Misc, CHECK BLOOD SUGAR AS DIRECTED ONCE A DAY AND FOR SYMPTOMS OF HIGH OR LOW BLOOD SUGAR, Disp: , Rfl:     secukinumab (COSENTYX PEN, 2 PENS,) 150 mg/mL PnIj injection, Inject 2 mL (300 mg total) under the skin every twenty-eight (28) days., Disp: 2 mL, Rfl: 11    simvastatin (ZOCOR) 20 MG tablet, Take 1 tablet (20 mg total) by mouth nightly., Disp: 90 tablet, Rfl: 3    spironolactone (ALDACTONE) 25 MG tablet, Take 1 tablet (25 mg total) by mouth daily., Disp: 90 tablet, Rfl: 3    triamcinolone (KENALOG) 0.1 % cream, Apply topically twice daily as needed only., Disp: 454 g, Rfl: 1    biotin 1 mg cap, Take by mouth. (Patient not taking: Reported on 09/13/2021), Disp: , Rfl:     cetirizine (ZYRTEC) 5 MG tablet, Take 1 tablet (5 mg total) by mouth once as needed. (Patient not taking: Reported on 09/13/2021), Disp: , Rfl:     tirzepatide (MOUNJARO) 10 mg/0.5 mL PnIj, Inject 10 mg under the skin every seven (7) days., Disp: 2 mL, Rfl: 2  Above medication  list reviewed and updated.    Medications         07/10/2021     8:53 PM   Weight Loss Medication History   Have you ever used any of the medications from this list? Metformin    Ozempic (semaglutide)    Ozempic/Wegovy (semaglutide)        Relevant symptom review:     Glaucoma: no  Palpitations: no  Chest Pain: no  Headaches: no  Nephrolithiasis: no  Seizures: no  H/o pancreatitis: no  Personal or family history of medullary cancer of thyroid:no    Musculoskeletal pain no  Urinary incontinence no  Heartburn no     Symptoms of cushings disease: no  H/o head trauma: no  H/o radiation to the brain: no    Irregular menstrual cycles: N/A post menopausal   Excessive facial hair: N/A post menopausal     Female patient ROS:     Last menstrual period: No LMP recorded. Patient is postmenopausal.  Current contraceptive use: n/a.    All other systems reviewed: Yes.    Relevant Family History:       Family History   Problem Relation Age of Onset    Diabetes Father     Hypertension Father     Kidney disease Father     Clotting disorder Father         Blood clot    Asthma Father     Diabetes Sister     Miscarriages / Stillbirths Sister     Hypertension Mother     Arthritis Mother     Breast cancer Maternal Aunt     Stroke Maternal Grandmother     Miscarriages / Stillbirths Maternal Grandmother     Melanoma Neg Hx     Basal cell carcinoma Neg Hx     Squamous cell carcinoma Neg Hx     Ovarian cancer Neg Hx          Social History     Socioeconomic History    Marital status: Widowed     Spouse name: None    Number of children: None    Years of education: None    Highest education level: None   Tobacco Use    Smoking status: Never    Smokeless tobacco: Never   Vaping Use Vaping Use: Never used   Substance and Sexual Activity    Alcohol use: Yes     Alcohol/week: 1.0 standard drink of alcohol     Types: 1 Cans of beer per week     Comment: Only on Saturday    Drug use: Never    Sexual activity: Not Currently     Partners: Male     Birth control/protection: None   Other Topics Concern    Do you use sunscreen? No    Tanning bed use? No    Are you easily burned? No    Excessive sun exposure? No    Blistering sunburns? No     Social Determinants of Health     Food Insecurity: No Food Insecurity (08/01/2021)    Hunger Vital Sign     Worried About Running Out of Food in the Last Year: Never true     Ran Out of Food in the Last Year: Never true       Vital Signs:     Blood pressure 143/85, pulse 82, temperature 36.2 ??C (97.2 ??F), height 159.3 cm (5' 2.72), weight (!) 140.6 kg (310 lb 0.6 oz).  Body mass index is 55.42 kg/m??. Waist Circumference: 50 inches Neck Circumference: 16    Physical Exam:     General exam: Patient appears calm and alert and oriented. Body mass index is 55.42 kg/m??.   Body fat distribution Central adiposity, Gluteofemoral adiposity, and General adiposity. No supraclavicular adiposity. No dorsal adiposity. Waist Circumference: 50 inches Neck Circumference: 16  Thyroid exam: no thyromegaly, no bruit.   Lymphatics: no lymphadenopathy.  Skin exam: No lipomas, mild acanthosis nigricans.   Oral exam : Tongue moist, pink. moderate OP crowding. Good dental hygiene.  CVS: RRR nl. s1s2, Peripheral edema none  Respiratory: CTAB .  Gastrointestinal Abdomen soft. NT/ND.Large pannus. Intertrigo: no. Difficult to palpate for hepatomegaly due to excess adiposity.  Musculoskeletal: Patient ambulatory without help.    Labs:      Lab work reviewed in [facility] and is noted underneath    Previous studies were reviewed. Labs listed below.    Lab Results   Component Value Date    A1C 5.8 12/30/2021    A1C 5.9 08/19/2021    A1C 5.7 05/21/2021    GLU 95 08/19/2021    GLU 140 05/21/2021    GLU 126 (H) 09/06/2020     No results found for: TSH  Lab Results   Component Value Date    CHOL 141 08/19/2021    HDL 46 08/19/2021    LDL 78 08/19/2021    TRIG 84 08/19/2021     Lab Results   Component Value Date    NA 144 08/19/2021    K 4.5 08/19/2021    CL 109 (H) 08/19/2021    CO2 30.2 08/19/2021    BUN 12 08/19/2021    CREATININE 0.95 (H) 08/19/2021    GLU 95 08/19/2021    CALCIUM 10.2 08/19/2021    ALBUMIN 4.0 08/19/2021    AST 19 08/19/2021    ALT 17 08/19/2021    ALKPHOS 62 08/19/2021     No results found for: VITDTOTAL  No results found for: WBC, RBC, HGB, HCT, MCV, MCH, MCHC, RDW, PLT, MPV          I attest that I, Constance Goltz, personally documented this note while acting as scribe for Marshell Garfinkel, MD.      Constance Goltz, Scribe.  01/08/2022     The documentation recorded by the scribe accurately reflects the service I personally performed and the decisions made by me.    Marshell Garfinkel, MD

## 2022-01-08 ENCOUNTER — Ambulatory Visit
Admit: 2022-01-08 | Discharge: 2022-01-09 | Payer: PRIVATE HEALTH INSURANCE | Attending: Family Medicine | Primary: Family Medicine

## 2022-01-08 DIAGNOSIS — Z6841 Body Mass Index (BMI) 40.0 and over, adult: Principal | ICD-10-CM

## 2022-01-08 DIAGNOSIS — E1165 Type 2 diabetes mellitus with hyperglycemia: Principal | ICD-10-CM

## 2022-01-08 MED ORDER — MOUNJARO 10 MG/0.5 ML SUBCUTANEOUS PEN INJECTOR
SUBCUTANEOUS | 2 refills | 0 days | Status: CP
Start: 2022-01-08 — End: ?
  Filled 2022-01-22: qty 2, 28d supply, fill #0

## 2022-01-08 NOTE — Unmapped (Signed)
Reviewed relevant medications and labs. Pt is following our Weight Management Clinic. See Obesity tab for medication changes.

## 2022-01-08 NOTE — Unmapped (Addendum)
Tracy Robles is a 56 y.o. female with Class 3 Obesity, [place] Obesity Staging System (EOSS) Stage 2 obesity due to genetic predisposition due to family history, physical inactivity, and frequent consumption of ultraprocessed foods, sleep disruption (waking up at 3 am for work).     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    GOALS: 1) Start doing water aerobics with cousin, add walking and light dumbbells while working  2) Increase fiber through fruits and veggies at every meal  3) Increase Mounjaro  4) Swap yoplait yogurt to chobani  5) Decrease occasional sodas    I have reviewed the patient's medical history, lifestyle history and labs/tests.   My recommendations include the following:    Medication: Weight trending favorably on Mounjaro 7.5 mg. Pt has no copay. Averaging 1.2 lb loss per week (~19 lb loss in ~16 weeks.) Discussed and decided to increase current dose of Mounjaro. Rx sent to [facility]. Pt verbalized understanding and agreement with plan.    INCREASE Mounjaro 7.5 mg --> 10 mg once weekly injections  Current:  - Mounjaro  Tried:  - fen-phen  - Wegovy   - Ozempic  - Metformin  Contraindicated:  - phentermine: elevated BP      Eating Pattern: Pt has variable eating pattern that includes cooking at home and packing lunch as well as occasionally eating fast food. We discussed sources of added sugar or excess calories such as sodas and eating out. Also encouraged Pt to eat 2 nutrient dense meals per day (currently skipping meals and snacking.)    Physical Activity: No current routine. Pt walks to and from workplace daily. Pt plans to start doing water aerobics.     Stress: Pt has some good coping mechanisms to deal with stressors including doing puzzles, playing games on phone or making jewelry.     Sleep: Pt has OSA. Sleeping 4 hours with CPAP. Wakes up at 3 am. Takes sister to work at 10 pm then goes to to sleep.     Obesity Surgery: Pt adamantly declines surgery. Pt is not interested in surgery due to hearing about other people having difficulty post-surgery. Pt also expresses concern about caring for mother and that her sister will not assist. (Updated 01/08/22)    Medical conditions:  Glaucoma: no  Seizures: no  Medullary thyroid cancer (personal or family hx): no  Multiple Endocrine Neoplasia: no  Palpitations/Tachycardia: no  Chest Pain: no  Headaches/Migraines: no  Nephrolithiasis: no  H/o pancreatitis: no  GERD: no    Prior Surgeries:  Cholecystectomy: no    Birth Control Methods: Post menopause    Liver Health Screening:  Fib-4 Score: 1.00--Cirrhosis less likely.

## 2022-02-04 NOTE — Unmapped (Signed)
[facility] Refill Coordination Note    Specialty Medication(s) to be Shipped:   Inflammatory Disorders: Cosentyx    Other medication(s) to be shipped: MOUNJARO 10 mg/0.5 mL Pnij (tirzepatide)     [name], DOB: 02-27-1966  Phone: There are no phone numbers on file.      All above HIPAA information was verified with patient.     Was a Nurse, learning disability used for this call? No    Completed refill call assessment today to schedule patient's medication shipment from the [facility] [phone]).  All relevant notes have been reviewed.     Specialty medication(s) and dose(s) confirmed: Regimen is correct and unchanged.   Changes to medications: Larkin reports no changes at this time.  Changes to insurance: No  New side effects reported not previously addressed with a pharmacist or physician: None reported  Questions for the pharmacist: No    Confirmed patient received a Conservation officer, historic buildings and a Surveyor, mining with first shipment. The patient will receive a drug information handout for each medication shipped and additional FDA Medication Guides as required.       DISEASE/MEDICATION-SPECIFIC INFORMATION        For patients on injectable medications: Patient currently has 0 doses left.  Next injection is scheduled for 10/28.    SPECIALTY MEDICATION ADHERENCE     Medication Adherence    Patient reported X missed doses in the last month: 0  Specialty Medication: Cosentyx  Patient is on additional specialty medications: No  Any gaps in refill history greater than 2 weeks in the last 3 months: no  Demonstrates understanding of importance of adherence: yes  Informant: patient  Reliability of informant: reliable              Confirmed plan for next specialty medication refill: delivery by pharmacy  Refills needed for supportive medications: not needed              Were doses missed due to medication being on hold? No    Cosentyx 150 mg/ml: 0 days of medicine on hand     REFERRAL TO PHARMACIST     Referral to the pharmacist: Not needed      [facility]     Shipping address confirmed in Epic.     Delivery Scheduled: Yes, Expected medication delivery date: 10/26.     Medication will be delivered via Same Day Courier to the prescription address in Epic WAM.    [name]   [facility] Specialty Technician

## 2022-02-06 MED FILL — COSENTYX PEN 300 MG/2 PENS (150 MG/ML) SUBCUTANEOUS: SUBCUTANEOUS | 28 days supply | Qty: 2 | Fill #11

## 2022-02-07 DIAGNOSIS — L409 Psoriasis, unspecified: Principal | ICD-10-CM

## 2022-02-07 MED ORDER — COSENTYX PEN 300 MG/2 PENS (150 MG/ML) SUBCUTANEOUS
SUBCUTANEOUS | 11 refills | 28 days
Start: 2022-02-07 — End: ?

## 2022-02-07 NOTE — Unmapped (Signed)
Refill request for cosentyx 150 mg/ml    LOV: 09/16/21    Medication pended for your review.

## 2022-02-11 MED FILL — MOUNJARO 10 MG/0.5 ML SUBCUTANEOUS PEN INJECTOR: SUBCUTANEOUS | 28 days supply | Qty: 2 | Fill #1

## 2022-02-11 NOTE — Unmapped (Signed)
Dermatology Note     Assessment and Plan:      Psoriasis, chronic, flaring and concern for psoriatic arthritis, BSA <5%  - Previously clear with excellent control with Cosentyx 300mg  monthly but now flaring with skin lesions and SI joint pain which is worse in the morning  - Previously failed clobetasol  - Previously discussed augmenting Cosentyx with UV therapy but unable to make frequent appointments due to job limitations. Methotrexate is not a viable option due to daily alcohol use and baseline creatinine elevations.   Altamease Oiler was previously prescribed by Dr. Marvis Moeller and patient reported that Rx was denied by insurance, so she has been taking Cosentyx 300 mg every month  - Continue Cosentyx 300mg  monthly   - Proper administration, adverse effects and what to expect were reviewed with patient  - Continue triamcinolone (KENALOG) 0.1% cream. Apply topically to active, raised areas twice daily PRN  - Referral to rheumatology for evaluation of joint pain     High risk medication use (Taltz)  - TB test neg (05/2021)    Idiopathic guttate hypomelanosis  - Etiology, progression, exacerbating and alleviating factors were reviewed at length with patient  - Reassurance provided regarding benign appearance on exam today  - No further treatment indicated in the absence of symptoms    The patient was advised to call for an appointment should any new, changing, or symptomatic lesions develop.     RTC: Return in about 1 year (around 02/14/2023) for follow up of psorasis. or sooner as needed   _________________________________________________________________      Chief Complaint     Chief Complaint   Patient presents with    Psoriasis     Psorisia follow up patient reports slight improvemnet. Patient reports welp and itching after each injection       HPI     Tracy Robles is a 56 y.o. female who presents as a returning patient (last seen by Dr. Marvis Moeller and Dr. Orma Flaming on 09/16/2021) to Dermatology for follow up of psoriasis. At last visit, patient was to start Taltz 80 mg q4 weeks and was to continue Cosentyx 300 mg qmonth until Altamease Oiler was available and continue triamcinolone 0.1% cream for her psoriasis.    Psoriasis:  - Flaring today on lower back  - Never started Taltz due to denial from insurance  - Currently receiving Cosentyx 300 mg every month with improvement  - Notes discoloration on upper left thigh near last injection site  - Endorses pruritus of lower back  - Endorses tenderness and popping noise of back joints when walking    Patient also reports multiple discolored spots on her bilateral lower legs.    The patient denies any other new or changing lesions or areas of concern.     Pertinent Past Medical History     No history of skin cancer  Diabetes  Psoriasis with consistent biopsy on 01/12/2018  Onychomycosis with clipping consistent from 11/09/2018    Family History:   Negative for melanoma    Social History:  She is a bus Hospital doctor for town of [place]  Husband passed away suddenly in 01/03/20    Past Medical History, Family History, Social History, Medication List, Allergies, and Problem List were reviewed in the rooming section of Epic.     ROS: Other than symptoms mentioned in the HPI, no fevers, chills, or other skin complaints    Physical Examination     GENERAL: Well-appearing female in no acute distress, resting  comfortably.  NEURO: Alert and oriented, answers questions appropriately  PSYCH: Normal mood and affect  SKIN: Examination of the back and bilateral lower extremities was performed  - several hypopigmented confetti like macules on bilateral lower legs  - back with few scaly erythematous macules    All areas not commented on are within normal limits or unremarkable    Scribe's Attestation: Cruzita Lederer, MD obtained and performed the history, physical exam and medical decision making elements that were entered into the chart. Signed by Donnella Sham, Scribe, on February 13, 2022 at 3:15 PM.    ----------------------------------------------------------------------------------------------------------------------  February 18, 2022 1:17 PM. Documentation assistance provided by the Scribe. I was present during the time the encounter was recorded. The information recorded by the Scribe was done at my direction and has been reviewed and validated by me.  ----------------------------------------------------------------------------------------------------------------------      (Approved Template 12/26/2019)

## 2022-02-13 ENCOUNTER — Ambulatory Visit
Admit: 2022-02-13 | Discharge: 2022-02-14 | Payer: PRIVATE HEALTH INSURANCE | Attending: Student in an Organized Health Care Education/Training Program | Primary: Student in an Organized Health Care Education/Training Program

## 2022-02-13 DIAGNOSIS — L409 Psoriasis, unspecified: Principal | ICD-10-CM

## 2022-02-13 MED ORDER — COSENTYX PEN 300 MG/2 PENS (150 MG/ML) SUBCUTANEOUS
SUBCUTANEOUS | 11 refills | 28.00000 days | Status: CP
Start: 2022-02-13 — End: ?
  Filled 2022-02-28: qty 2, 28d supply, fill #0

## 2022-02-13 MED ORDER — TRIAMCINOLONE ACETONIDE 0.1 % TOPICAL CREAM
INTRAMUSCULAR | 3 refills | 0.00000 days | Status: CP
Start: 2022-02-13 — End: ?
  Filled 2022-02-14: qty 80, 30d supply, fill #0

## 2022-02-25 NOTE — Unmapped (Signed)
[facility] Refill Coordination Note    Specialty Medication(s) to be Shipped:   Inflammatory Disorders: Cosentyx    Other medication(s) to be shipped: No additional medications requested for fill at this time     Tracy Robles, DOB: 12-Aug-1965  Phone: There are no phone numbers on file.      All above HIPAA information was verified with patient.     Was a Nurse, learning disability used for this call? No    Completed refill call assessment today to schedule patient's medication shipment from the [facility] [phone]).  All relevant notes have been reviewed.     Specialty medication(s) and dose(s) confirmed: Regimen is correct and unchanged.   Changes to medications: [name] reports no changes at this time.  Changes to insurance: No  New side effects reported not previously addressed with a pharmacist or physician: None reported  Questions for the pharmacist: No    Confirmed patient received a Conservation officer, historic buildings and a Surveyor, mining with first shipment. The patient will receive a drug information handout for each medication shipped and additional FDA Medication Guides as required.       DISEASE/MEDICATION-SPECIFIC INFORMATION        For patients on injectable medications: Patient currently has 0 doses left.  Next injection is scheduled for 11/28.    SPECIALTY MEDICATION ADHERENCE     Medication Adherence    Patient reported X missed doses in the last month: 0  Specialty Medication: Cosentyx  Patient is on additional specialty medications: No  Any gaps in refill history greater than 2 weeks in the last 3 months: no  Demonstrates understanding of importance of adherence: yes  Informant: patient  Reliability of informant: reliable              Confirmed plan for next specialty medication refill: delivery by pharmacy  Refills needed for supportive medications: not needed              Were doses missed due to medication being on hold? No    Cosentyx 150 mg/ml: 0 days of medicine on hand REFERRAL TO PHARMACIST     Referral to the pharmacist: Not needed      [facility]     Shipping address confirmed in Epic.     Delivery Scheduled: Yes, Expected medication delivery date: 11/17.     Medication will be delivered via Same Day Courier to the prescription address in Epic [place].    [name]   [facility] Specialty Technician

## 2022-03-31 NOTE — Unmapped (Addendum)
BP is not controlled based on today's blood pressure in office: 150/84. Home BP last checked at 140/83. Known hx of OSA, patient using CPAP w nasal pillow nightly. Patient not taking HCTZ consistently but is taking other medications as prescribed.  - Discussed increasing losartan as patient has not been consistently taking HCTZ. Patient agreeable to INCREASE losartan to 100 mg daily. Standard SE and risks reviewed.  - Continue amlodipine (Norvasc) 10 mg once daily,  and spironolactone 25 mg daily. Refills as below  -For now will hold, hydrochlorothiazide 25 mg once daily given pt unable to take on takes she works 2/2 frequent urination   - Encouraged healthy, low-salt diet and regular exercise. Recommended patient check blood pressure at home or at local pharmacy with goal BP 130/80  - Rechecking BMP today  - Return for lab recheck in 2 weeks (off hydrochlorothiazide/inc'd losartan to 100 mg). BP recheck OV in 4-6 weeks    Meds tried- lisinopril (cough), hydrochlorothiazide (frequent urination)    BP Readings from Last 3 Encounters:   04/01/22 157/88   01/08/22 143/85   12/30/21 133/83     Lab Results   Component Value Date    K 4.5 08/19/2021    K 3.8 05/21/2021    NA 144 08/19/2021    NA 142 05/21/2021    CREATININE 0.95 (H) 08/19/2021    CREATININE 0.95 (H) 05/21/2021

## 2022-03-31 NOTE — Unmapped (Signed)
 Name:  Tracy Robles  DOB: 09-10-1965  Today's Date: 04/01/2022  Age:  56 y.o.    A/P:  Problem List Items Addressed This Visit       Class 3 severe obesity with body mass index (BMI) of 50.0 to 59.9 in adult (CMS-HCC)     3 lb wt loss since last OV. Followed by Dr. Loa Socks for weight management clinic. Has not tried topiramate (no hx of nephrolithiasis). Tolerating Mounjaro 10 mg weekly ok. Discussed smaller more frequent meals. Reports she has decreased processed snacks and sweets. Drinking most water, occasionally arnold palmer. Walking some, encouraged to try to walk more btw training- aiming 10 min 4 times and working up to longer periods.   - Meds tried: Reginal Lutes (lost insurance covered)  Ozempic (no weight loss), metformin (diarrhea even on lower doses affecting ability to do job)    Today???s BP: 150/84. Current BMI: (!) 55.41.  Wt Readings from Last 4 Encounters:   04/01/22 (!) 137.4 kg (303 lb)   02/13/22 (!) 138.8 kg (306 lb)   01/08/22 (!) 140.6 kg (310 lb 0.6 oz)   12/30/21 (!) 142 kg (313 lb)          Primary hypertension     BP is not controlled based on today's blood pressure in office: 150/84. Home BP last checked at 140/83. Known hx of OSA, patient using CPAP w nasal pillow nightly. Patient not taking HCTZ consistently but is taking other medications as prescribed.  - Discussed increasing losartan as patient has not been consistently taking HCTZ. Patient agreeable to INCREASE losartan to 100 mg daily. Standard SE and risks reviewed.  - Continue amlodipine (Norvasc) 10 mg once daily,  and spironolactone 25 mg daily. Refills as below  -For now will hold, hydrochlorothiazide 25 mg once daily given pt unable to take on takes she works 2/2 frequent urination   - Encouraged healthy, low-salt diet and regular exercise. Recommended patient check blood pressure at home or at local pharmacy with goal BP 130/80  - Rechecking BMP today  - Return for lab recheck in 2 weeks (off hydrochlorothiazide/inc'd losartan to 100 mg). BP recheck OV in 4-6 weeks    Meds tried- lisinopril (cough), hydrochlorothiazide (frequent urination)    BP Readings from Last 3 Encounters:   04/01/22 157/88   01/08/22 143/85   12/30/21 133/83     Lab Results   Component Value Date    K 4.5 08/19/2021    K 3.8 05/21/2021    NA 144 08/19/2021    NA 142 05/21/2021    CREATININE 0.95 (H) 08/19/2021    CREATININE 0.95 (H) 05/21/2021             Relevant Medications    amlodipine (NORVASC) 10 MG tablet    losartan (COZAAR) 100 MG tablet    spironolactone (ALDACTONE) 25 MG tablet    Other Relevant Orders    Basic Metabolic Panel (Completed)    Basic Metabolic Panel    Type 2 diabetes mellitus with hyperglycemia, without long-term current use of insulin (CMS-HCC) - Primary     Diabetes is at goal. Last A1C 5.8 12/30/2021. Followed by Dr. Loa Socks for weight management clinic, increased Mounjaro 7.5 --> 10 mg weekly by Dr. Loa Socks.    Medication:   - Continue current medications as prescribed.  - Refilled simvastatin today  - Continue following with Dr. Loa Socks    Meds Tried  Metformin- diarrhea  Ozempic - good glycemic control. Switched to [employer] to  see if this helped with weight loss.   Lab Results   Component Value Date    Hemoglobin A1C 5.8 12/30/2021      DM Check list:  Foot Exam: 04/01/2022  Retinal Exam: 01/30/20 per records. Pt has eye exam scheduled for later this week  Flu shot: 12/30/21  Pneumovax: 11/18/18. Prevnar20: 02/12/21  Baseline EKG: none on file at [facility], [place] without acute ST changes noted in [facility] records 07/01/18  Microalbumin: 04/01/2022 on losartan 50 mg daily  Lab Results   Component Value Date    Albumin/Creatinine Ratio  02/12/2021      Comment:      Unable to calculate.   LDL: on simvastatin 20 mg qd  Lab Results   Component Value Date    LDL Calculated 78 08/19/2021          Relevant Medications    simvastatin (ZOCOR) 20 MG tablet    Other Relevant Orders    Albumin/creatinine urine ratio       Medication adherence and barriers to the treatment plan have been addressed. Opportunities to optimize healthy behaviors have been discussed. Patient / caregiver voiced understanding.      Return in about 6 weeks (around 05/13/2022) for Recheck BP.    S:  Tracy Robles is a 56 y.o. female who presents for follow up. Last seen by this PCP 12/30/2021 for HTN, DM2, and obesity follow up. Restarted losartan 50 mg daily for HTN and placed referral to RD. Patient stopped metformin 2/2 SE diarrhea. Seen 01/08/2022 by Dr. Loa Socks for weight management clinic; increased Mounjaro to 10 mg weekly. Seen by Dermatology 02/13/2022 for psoriasis follow up; patient to continue Cosentyx 300 mg monthly and triamcinolone 0.1% cream. Referral to rheumatology placed. 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     Hypertension   Patient with history of hypertension manages with amlodipine 10 mg daily, hydrochlorothiazide 25 mg daily, spironolactone 25 mg daily, and losartan 50 mg daily (restarted at last OV). Today she reports she has been taking hydrochlorothiazide ~5 days a week, but has been taking everything else consistently. Patient took all medications prior to today's visit. Blood pressure at home last checked 140/83, at triage today 150/84. Rechecked at:  Denies CP, SOB, L/D or peripheral edema. Patient says both her mom and sister are staying at her house. Her sister has recently been diagnosed with vertigo and patient has been under increased stress lately.     Sleep apnea  Patient uses CPAP with nasal pillow nightly.     DM2/weight management  Patient taking Mounjaro 10 mg weekly and follows with Dr. Loa Socks for weight management. Last A1C 5.8 on 12/30/2021. Home BS not reported. Patient stopped metformin at last OV 2/2 SE diarrhea.    Back pain  History of low back pain with and without sciatica. Patient stopped taking gabapentin 600 mg by Dr. Loa Socks 2/2 risk of weight gain.       ROS:  A 12 point review of systems was negative except for pertinent items noted in the HPI.    Past medical history, family history, surgical history and social history personally reviewed and verified with patient.     PROBLEM LIST  Patient Active Problem List   Diagnosis    Class 3 severe obesity with body mass index (BMI) of 50.0 to 59.9 in adult (CMS-HCC)    Eczema    Primary hypertension    Psoriasis    Sleep apnea    Type  2 diabetes mellitus with hyperglycemia, without long-term current use of insulin (CMS-HCC)    Encounter to establish care    Back pain    Administrative encounter    Ingrown toenail of right foot    Left shoulder pain    Cramp of both lower extremities    Dermatophytosis of foot    Mass of left hand       O:  Vitals:    04/01/22 1333   BP: 157/88   Pulse:    Temp:    SpO2:      General:  Well appearing, well nourished in no distress.   Skin: no obvious rash or  prominent lesions    Lymphatic: no cervical LAD. No TM  Cardiovascular:  rrr no mrg, no peripheral edema  Eyes:  conjunctiva clear, sclera non-icteric   Ears/nose/throat: no obvious external deformities  Respiratory: CTAB, breathing comfortably  Musculoskeletal:  Normal gait and station.  Neurologic: moves extremities symmetrically, cranial nerves grossly intact  Hematologic: no obvious ecchymosis     Psychiatric:  Oriented X3, intact judgement and insight, normal mood and affect.      I attest that I, Ranee Gosselin, personally documented this note while acting as scribe for [employer], PAC.      [place]uling Geanie Kenning, Scribe.  04/01/2022     The documentation recorded by the scribe accurately reflects the service I personally performed and the decisions made by me.    Leocadio Heal A Gracin Soohoo, PAC

## 2022-03-31 NOTE — Unmapped (Addendum)
3 lb wt loss since last OV. Followed by Dr. Loa Socks for weight management clinic. Has not tried topiramate (no hx of nephrolithiasis). Tolerating Mounjaro 10 mg weekly ok. Discussed smaller more frequent meals. Reports she has decreased processed snacks and sweets. Drinking most water, occasionally arnold palmer. Walking some, encouraged to try to walk more btw training- aiming 10 min 4 times and working up to longer periods.   - Meds tried: Reginal Lutes (lost insurance covered)  Ozempic (no weight loss), metformin (diarrhea even on lower doses affecting ability to do job)    Today???s BP: 150/84. Current BMI: (!) 55.41.  Wt Readings from Last 4 Encounters:   04/01/22 (!) 137.4 kg (303 lb)   02/13/22 (!) 138.8 kg (306 lb)   01/08/22 (!) 140.6 kg (310 lb 0.6 oz)   12/30/21 (!) 142 kg (313 lb)

## 2022-03-31 NOTE — Unmapped (Signed)
[facility] Refill Coordination Note    Specialty Medication(s) to be Shipped:   Inflammatory Disorders: Cosentyx    Other medication(s) to be shipped:  Tracy Robles, DOB: 06-16-1965  Phone: There are no phone numbers on file.      All above HIPAA information was verified with patient.     Was a Nurse, learning disability used for this call? No    Completed refill call assessment today to schedule patient's medication shipment from the [facility] [phone]).  All relevant notes have been reviewed.     Specialty medication(s) and dose(s) confirmed: Regimen is correct and unchanged.   Changes to medications: Maali reports no changes at this time.  Changes to insurance: No  New side effects reported not previously addressed with a pharmacist or physician: None reported  Questions for the pharmacist: No    Confirmed patient received a Conservation officer, historic buildings and a Surveyor, mining with first shipment. The patient will receive a drug information handout for each medication shipped and additional FDA Medication Guides as required.       DISEASE/MEDICATION-SPECIFIC INFORMATION        For patients on injectable medications: Patient currently has 0 doses left.  Next injection is scheduled for 04/10/22.    SPECIALTY MEDICATION ADHERENCE     Medication Adherence    Patient reported X missed doses in the last month: 0  Specialty Medication: Cosentyx 150 mg/mL  Patient is on additional specialty medications: No  Informant: patient                       Were doses missed due to medication being on hold? No    Cosentyx 150 mg/ml: 0 days of medicine on hand     REFERRAL TO PHARMACIST     Referral to the pharmacist: Not needed      [facility]     Shipping address confirmed in Epic.     Delivery Scheduled: Yes, Expected medication delivery date: 04/04/22.     Medication will be delivered via Same Day Courier to the prescription address in Epic [place].    Tracy Robles   [facility] Specialty Technician

## 2022-03-31 NOTE — Unmapped (Addendum)
Diabetes is at goal. Last A1C 5.8 12/30/2021. Followed by Dr. Loa Socks for weight management clinic, increased Mounjaro 7.5 --> 10 mg weekly by Dr. Loa Socks.    Medication:   - Continue Mounjaro 10 mg weekly  - Continue following with Dr. Loa Socks    Meds Tried  Metformin- diarrhea  Ozempic - good glycemic control. Switched to [facility] to see if this helped with weight loss.   Lab Results   Component Value Date    Hemoglobin A1C 5.8 12/30/2021      DM Check list:  Foot Exam: 04/01/2022  Retinal Exam: 01/30/20 per records. Pt reports eye exam at [facility] within last year  Flu shot: 12/30/21  Pneumovax: 11/18/18. Prevnar20: 02/12/21  Baseline EKG: none on file at [facility] [place] without acute ST changes noted in [facility] records 07/01/18  Microalbumin: on losartan 50 mg daily  Lab Results   Component Value Date    Albumin/Creatinine Ratio  02/12/2021      Comment:      Unable to calculate.     LDL: on simvastatin 20 mg qd  Lab Results   Component Value Date    LDL Calculated 78 08/19/2021

## 2022-04-01 ENCOUNTER — Ambulatory Visit: Admit: 2022-04-01 | Discharge: 2022-04-02 | Payer: PRIVATE HEALTH INSURANCE | Attending: Medical | Primary: Medical

## 2022-04-01 DIAGNOSIS — E1165 Type 2 diabetes mellitus with hyperglycemia: Principal | ICD-10-CM

## 2022-04-01 DIAGNOSIS — I10 Essential (primary) hypertension: Principal | ICD-10-CM

## 2022-04-01 DIAGNOSIS — Z6841 Body Mass Index (BMI) 40.0 and over, adult: Principal | ICD-10-CM

## 2022-04-01 LAB — BASIC METABOLIC PANEL
ANION GAP: 7 mmol/L (ref 5–14)
BLOOD UREA NITROGEN: 7 mg/dL — ABNORMAL LOW (ref 9–23)
BUN / CREAT RATIO: 8
CALCIUM: 9.8 mg/dL (ref 8.7–10.4)
CHLORIDE: 105 mmol/L (ref 98–107)
CO2: 30.4 mmol/L (ref 20.0–31.0)
CREATININE: 0.87 mg/dL
EGFR CKD-EPI (2021) FEMALE: 79 mL/min/{1.73_m2} (ref >=60)
GLUCOSE RANDOM: 57 mg/dL — ABNORMAL LOW (ref 70–99)
POTASSIUM: 4.1 mmol/L (ref 3.4–4.8)
SODIUM: 142 mmol/L (ref 135–145)

## 2022-04-01 LAB — ALBUMIN / CREATININE URINE RATIO
ALBUMIN QUANT URINE: <0.3 mg/dL
CREATININE, URINE: 65.3 mg/dL

## 2022-04-01 MED ORDER — AMLODIPINE 10 MG TABLET
ORAL_TABLET | Freq: Every day | ORAL | 3 refills | 90 days | Status: CP
Start: 2022-04-01 — End: ?
  Filled 2022-05-05: qty 90, 90d supply, fill #0

## 2022-04-01 MED ORDER — LOSARTAN 100 MG TABLET
ORAL_TABLET | Freq: Every day | ORAL | 3 refills | 90 days | Status: CP
Start: 2022-04-01 — End: 2023-04-01
  Filled 2022-05-05: qty 90, 90d supply, fill #0

## 2022-04-01 MED ORDER — SPIRONOLACTONE 25 MG TABLET
ORAL_TABLET | Freq: Every day | ORAL | 3 refills | 90 days | Status: CP
Start: 2022-04-01 — End: 2023-04-01
  Filled 2022-05-05: qty 90, 90d supply, fill #0

## 2022-04-01 MED ORDER — SIMVASTATIN 20 MG TABLET
ORAL_TABLET | Freq: Every evening | ORAL | 3 refills | 90 days | Status: CP
Start: 2022-04-01 — End: ?
  Filled 2022-05-05: qty 90, 90d supply, fill #0

## 2022-04-01 NOTE — Unmapped (Signed)
Stay up to date with all of your appointments, request medication refills and communication with your care team all in one place by using My [place] Chart. If you need assistance signing up for  My [facility] Chart or password recovery please contact  [facility] at [phone] M-F 8am-7pm      Test Result Update!  You may notice that your labs and results are now available as soon as they are resulted--usually even within 24 hours! This means you may see your results even before I see them. Please wait for me to respond to your results before sending your questions. I usually respond to results within 3 business days, sooner if more urgent. If you have not heard from me and you feel the result is urgent, feel free to send me a message via My [facility] chart. Otherwise, please wait for me to review the lab result and respond appropriately.     Non Urgent Needs:  Please consider sending a non urgent patient advice request via your My [place] chart, these messages are reviewed by clinical staff and will be responded to within 24-48 business hours. If you have an urgent need please schedule an appointment online via your My [facility] chart or call the office directly to schedule.     Medication Refill Requests:  Please submit a refill request via your My [facility] chart or call your pharmacy to have the refill request sent electronically. Please allow 48-72 business hours for all refill requests. If a prior authorization is required, additional time may be needed to allow for medical review and communication to your insurance provider.     Form Request  All FMLA, Disability and School paperwork requires an appointment. Please schedule via your My [facility] chart or call the office to schedule. Please allow up to 7 business days for all other paperwork. We will contact you once completed for pick up.

## 2022-04-04 MED FILL — MOUNJARO 10 MG/0.5 ML SUBCUTANEOUS PEN INJECTOR: SUBCUTANEOUS | 28 days supply | Qty: 2 | Fill #2

## 2022-04-04 MED FILL — COSENTYX PEN 300 MG/2 PENS (150 MG/ML) SUBCUTANEOUS: SUBCUTANEOUS | 28 days supply | Qty: 2 | Fill #1

## 2022-04-15 ENCOUNTER — Other Ambulatory Visit: Admit: 2022-04-15 | Discharge: 2022-04-16 | Payer: PRIVATE HEALTH INSURANCE

## 2022-04-15 DIAGNOSIS — I10 Essential (primary) hypertension: Principal | ICD-10-CM

## 2022-04-15 LAB — BASIC METABOLIC PANEL
ANION GAP: 8 mmol/L (ref 5–14)
BLOOD UREA NITROGEN: 17 mg/dL (ref 9–23)
BUN / CREAT RATIO: 18
CALCIUM: 9.4 mg/dL (ref 8.7–10.4)
CHLORIDE: 105 mmol/L (ref 98–107)
CO2: 27.4 mmol/L (ref 20.0–31.0)
CREATININE: 0.95 mg/dL
EGFR CKD-EPI (2021) FEMALE: 71 mL/min/{1.73_m2} (ref >=60)
GLUCOSE RANDOM: 92 mg/dL (ref 70–179)
POTASSIUM: 4.2 mmol/L (ref 3.4–4.8)
SODIUM: 140 mmol/L (ref 135–145)

## 2022-04-15 NOTE — Unmapped (Signed)
Pt presenting for lab only visit, specimen collected with 2nd attempt, pt tolerated well

## 2022-04-25 DIAGNOSIS — E1165 Type 2 diabetes mellitus with hyperglycemia: Principal | ICD-10-CM

## 2022-04-25 MED ORDER — MOUNJARO 10 MG/0.5 ML SUBCUTANEOUS PEN INJECTOR
SUBCUTANEOUS | 2 refills | 28 days
Start: 2022-04-25 — End: ?

## 2022-04-29 NOTE — Unmapped (Signed)
Patient is requesting the following refill  Requested Prescriptions     Pending Prescriptions Disp Refills    tirzepatide (MOUNJARO) 10 mg/0.5 mL PnIj 2 mL 2     Sig: Inject the contents of 1 pen (10 mg) under the skin every seven (7) days.       Recent Visits  Date Type Provider Dept   04/01/22 Office Visit Tester, Vivian, [facility] [address] [facility]   01/08/22 Office Visit Ro, Honor Junes, MD [facility] [address]   12/30/21 Office Visit Tester, [place], [facility] [address] [place]   09/26/21 Office Visit Tester, [place], [facility] [address] [place]   08/19/21 Office Visit Tester, [place], [facility] [address] [place]   08/01/21 Office Visit Tester, [place], [facility] [address] [place]   07/10/21 Office Visit Payton Mccallum, MD [facility] [address]   05/21/21 Office Visit Tester, [place], [facility] [address] [place]   Showing recent visits within past 365 days with a meds authorizing provider and meeting all other requirements  Future Appointments  Date Type Provider Dept   04/30/22 Appointment Ro, Honor Junes, MD [facility] [address] [place]   05/29/22 Appointment Ro, Honor Junes, MD [facility] [address] [place]   06/12/22 Appointment Payton Mccallum, MD [facility] [address] [facility]   08/05/22 Appointment Ro, Honor Junes, MD [facility] [address]    Showing future appointments within next 365 days with a meds authorizing provider and meeting all other requirements       Labs: Not applicable this refill

## 2022-04-30 ENCOUNTER — Ambulatory Visit
Admit: 2022-04-30 | Discharge: 2022-05-01 | Payer: PRIVATE HEALTH INSURANCE | Attending: Family Medicine | Primary: Family Medicine

## 2022-04-30 DIAGNOSIS — Z6841 Body Mass Index (BMI) 40.0 and over, adult: Principal | ICD-10-CM

## 2022-04-30 DIAGNOSIS — E1165 Type 2 diabetes mellitus with hyperglycemia: Principal | ICD-10-CM

## 2022-04-30 MED ORDER — MOUNJARO 12.5 MG/0.5 ML SUBCUTANEOUS PEN INJECTOR
SUBCUTANEOUS | 3 refills | 0 days | Status: CP
Start: 2022-04-30 — End: ?

## 2022-04-30 MED ORDER — MOUNJARO 10 MG/0.5 ML SUBCUTANEOUS PEN INJECTOR
SUBCUTANEOUS | 2 refills | 28 days | Status: CP
Start: 2022-04-30 — End: 2022-04-30

## 2022-04-30 MED ORDER — TOPIRAMATE 25 MG TABLET
ORAL_TABLET | Freq: Every evening | ORAL | 1 refills | 60 days | Status: CP
Start: 2022-04-30 — End: ?
  Filled 2022-05-08: qty 60, 60d supply, fill #0

## 2022-04-30 NOTE — Unmapped (Addendum)
Tracy Robles is a 57 y.o. female with Class 3 Obesity, [place] Obesity Staging System (EOSS) Stage 2 obesity due to genetic predisposition due to family history, physical inactivity, and frequent consumption of ultraprocessed foods, sleep disruption (waking up at 3 am for work).     Pt is precontemplative to making lifestyle changes.    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   My recommendations include the following:    Medication: Weight trending favorably on Mounjaro 10 mg. Pt has $25. Discussed and decided to increase current dose of Mounjaro. Rx sent to [facility]. Also discussed and decided to trial low dose topiramate. Instruction given. Side effect profile discussed. Prescription sent. Pt verbalized understanding and agreement with plan.    START topiramate 25 mg once daily at bedtime, INCREASE Mounjaro 10 mg --> 12.5 once weekly injections  Current:  - Mounjaro  - topiramate  Tried:  - fen-phen  - Wegovy   - Ozempic  - Metformin  Contraindicated:  - phentermine: elevated BP      Eating Pattern: Continues to vary eating pattern between cooking at home and eating out. Encouraged Pt to try to limit eating out and reduce 1 soda per day and occasional sweet tea. Pt was precontemplative of changing behaviors.     Physical Activity: No current routine. Time and taking care of mother is a major barrier. Provided Pt with handout detailing at home workout videos. Goal to try some Walk at Home videos.     Stress: Pt has some good coping mechanisms to deal with stressors including doing puzzles, playing games on phone or making jewelry.     Sleep: Pt has OSA. Sleeping 4 hours with CPAP. Wakes up at 3 am. Takes sister to work at 10 pm then goes to to sleep.     Obesity Surgery: Pt adamantly declines surgery. Pt is not interested in surgery due to hearing about other people having difficulty post-surgery. Pt also expresses concern about caring for mother and that her sister will not assist. (Updated 01/08/22)    Continues to decline adamantly. (Updated 04/30/22)    Medical conditions:  Glaucoma: no  Seizures: no  Medullary thyroid cancer (personal or family hx): no  Multiple Endocrine Neoplasia: no  Palpitations/Tachycardia: no  Chest Pain: no  Headaches/Migraines: no  Nephrolithiasis: no  H/o pancreatitis: no  GERD: no    Prior Surgeries:  Cholecystectomy: no    Birth Control Methods: Post menopause    Liver Health Screening:  Fib-4 Score: 1.00--Cirrhosis less likely.

## 2022-04-30 NOTE — Unmapped (Signed)
Reviewed relevant medications and labs. Pt is following our Weight Management Clinic. See Obesity tab for medication changes.

## 2022-04-30 NOTE — Unmapped (Signed)
[facility] Follow Up    Assessment/Plan:     Chief Complaint   Patient presents with    Follow-up     Wt mngmt       Problem List Items Addressed This Visit          Unprioritized    Class 3 severe obesity with body mass index (BMI) of 50.0 to 59.9 in adult (CMS-HCC)     Tracy Robles is a 57 y.o. female with Class 3 Obesity, [place] Obesity Staging System (EOSS) Stage 2 obesity due to genetic predisposition due to family history, physical inactivity, and frequent consumption of ultraprocessed foods, sleep disruption (waking up at 3 am for work).     Pt is precontemplative to making lifestyle changes.    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   My recommendations include the following:    Medication: Weight trending favorably on Mounjaro 10 mg. Pt has $25. Discussed and decided to increase current dose of Mounjaro. Rx sent to [facility]. Also discussed and decided to trial low dose topiramate. Instruction given. Side effect profile discussed. Prescription sent. Pt verbalized understanding and agreement with plan.    START topiramate 25 mg once daily at bedtime, INCREASE Mounjaro 10 mg --> 12.5 once weekly injections  Current:  - Mounjaro  - topiramate  Tried:  - fen-phen  - Wegovy   - Ozempic  - Metformin  Contraindicated:  - phentermine: elevated BP      Eating Pattern: Continues to vary eating pattern between cooking at home and eating out. Encouraged Pt to try to limit eating out and reduce 1 soda per day and occasional sweet tea. Pt was precontemplative of changing behaviors.     Physical Activity: No current routine. Time and taking care of mother is a major barrier. Provided Pt with handout detailing at home workout videos. Goal to try some Walk at Home videos.     Stress: Pt has some good coping mechanisms to deal with stressors including doing puzzles, playing games on phone or making jewelry.     Sleep: Pt has OSA. Sleeping 4 hours with CPAP. Wakes up at 3 am. Takes sister to work at 10 pm then goes to to sleep.     Obesity Surgery: Pt adamantly declines surgery. Pt is not interested in surgery due to hearing about other people having difficulty post-surgery. Pt also expresses concern about caring for mother and that her sister will not assist. (Updated 01/08/22)    Continues to decline adamantly. (Updated 04/30/22)    Medical conditions:  Glaucoma: no  Seizures: no  Medullary thyroid cancer (personal or family hx): no  Multiple Endocrine Neoplasia: no  Palpitations/Tachycardia: no  Chest Pain: no  Headaches/Migraines: no  Nephrolithiasis: no  H/o pancreatitis: no  GERD: no    Prior Surgeries:  Cholecystectomy: no    Birth Control Methods: Post menopause    Liver Health Screening:  Fib-4 Score: 1.00--Cirrhosis less likely.         Type 2 diabetes mellitus with hyperglycemia, without long-term current use  of insulin (CMS-HCC) - Primary     Reviewed relevant medications and labs. Pt is following our [facility]. See Obesity tab for medication changes.          Relevant Medications    tirzepatide (MOUNJARO) 12.5 mg/0.5 mL PnIj        Return in about 2 months (around 06/29/2022) for Weight clinic F/U one slot..    I have reviewed and addressed the patient???s adherence and response to prescribed medications. I have identified patient barriers to following the proposed medication and treatment plan, and have noted opportunities to optimize healthy behaviors. I have answered the patient???s questions to satisfaction and the patient voices understanding.    Time: Greater than 50% of this encounter was spent in direct consultation with the patient in evaluation and discussing all of the above. Duration of encounter: 30 minutes.     HPI:     Tracy Robles is a 57 y.o. female who  has a past medical history of Allergic (Don't know), Arthritis (2011), BMI 60.0-69.9, adult (CMS-HCC) (05/29/2021), Diabetes mellitus (CMS-HCC), Eczema, High blood pressure, and Psoriasis. who presents today for [facility] follow up.    Weight Management History  Wt Readings from Last 6 Encounters:   04/30/22 (!) 137.4 kg (303 lb)   04/01/22 (!) 137.4 kg (303 lb)   02/13/22 (!) 138.8 kg (306 lb)   01/08/22 (!) 140.6 kg (310 lb 0.6 oz)   12/30/21 (!) 142 kg (313 lb)   09/26/21 (!) 149.2 kg (329 lb)         01/08/2022    10:46 AM 04/30/2022     1:54 PM   Waist Circumference   Waist Circumference 50 inches 50 inches         Update: 7 lb loss in 4 mos.   Medication: Taking Mounjaro 10 mg. Tolerating well. Denies s/e. Endorses appetite suppression.   Eating Pattern: Drinking 1 can of soda per day.   - Breakfast: Oatmeal, Reg yogurt, Other cook, and Fast food 2 times per week  - Lunch: Leftovers, Soup, Chicken, Frozen Meal, and Other cook  - Dinner: Soup, Chicken, Diplomatic Services operational officer, Fish, Veggies, and Other cook  - Drinks: Water and Sweet tea  - Snacks: Nuts, Chips, and Popcorn  Physical Activity: Pt has not started doing water aerobics.   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    Social History: Social History     Socioeconomic History    Marital status: Widowed     Spouse name: None    Number of children: None    Years of education: None    Highest education level: None   Tobacco Use  Smoking status: Never    Smokeless tobacco: Never   Vaping Use    Vaping Use: Never used   Substance and Sexual Activity    Alcohol use: Yes     Alcohol/week: 1.0 standard drink of alcohol     Types: 1 Cans of beer per week     Comment: Only on Saturday    Drug use: Never    Sexual activity: Not Currently     Partners: Male     Birth control/protection: None   Other Topics Concern    Do you use sunscreen? No    Tanning bed use? No    Are you easily burned? No    Excessive sun exposure? No    Blistering sunburns? No     Social Determinants of Health     Food Insecurity: No Food Insecurity (08/01/2021)    Hunger Vital Sign     Worried About Running Out of Food in the Last Year: Never true     Ran Out of Food in the Last Year: Never true       Family History:     Family History   Problem Relation Age of Onset    Diabetes Father     Hypertension Father     Kidney disease Father     Clotting disorder Father         Blood clot    Asthma Father     Diabetes Sister     Miscarriages / Stillbirths Sister     Hypertension Mother     Arthritis Mother     Breast cancer Maternal Aunt     Stroke Maternal Grandmother     Miscarriages / Stillbirths Maternal Grandmother     Melanoma Neg Hx     Basal cell carcinoma Neg Hx     Squamous cell carcinoma Neg Hx     Ovarian cancer Neg Hx        Allergies:     Lisinopril, Latex, and Penicillins    Current Medications:     Current Outpatient Medications   Medication Sig Dispense Refill    amlodipine (NORVASC) 10 MG tablet Take 1 tablet (10 mg total) by mouth daily. 90 tablet 3    biotin 1 mg cap Take by mouth.      blood sugar diagnostic (GLUCOSE BLOOD) Strp Check blood sugar as directed once a day and for symptoms of high or low blood sugar. 100 strip 3    cetirizine (ZYRTEC) 5 MG tablet Take 1 tablet (5 mg total) by mouth once as needed.      clobetasoL (TEMOVATE) 0.05 % external solution Apply topically to scalp twice daily. 50 mL 6    clobetasol (TEMOVATE) 0.05 % ointment Apply topically to rash on body twice daily. 60 g 6    hydroCHLOROthiazide (HYDRODIURIL) 25 MG tablet Take 1 tablet (25 mg total) by mouth daily. 90 tablet 3    loratadine (CLARITIN) 10 mg tablet Take 1 tablet (10 mg total) by mouth daily as needed for allergies.      losartan (COZAAR) 100 MG tablet Take 1 tablet (100 mg total) by mouth daily. 90 tablet 3    multivit with min-folic acid (MULTIVITAMIN GUMMIES) 200 mcg Chew Chew.      ONETOUCH DELICA PLUS LANCET 33 gauge Misc CHECK BLOOD SUGAR AS DIRECTED ONCE A DAY AND FOR SYMPTOMS OF HIGH OR LOW BLOOD SUGAR      secukinumab (COSENTYX PEN, 2 PENS,) 150 mg/mL PnIj injection Inject the  contents of 2 pens (300 mg total) under the skin every twenty-eight (28) days. 2 mL 11    simvastatin (ZOCOR) 20 MG tablet Take 1 tablet (20 mg total) by mouth nightly. 90 tablet 3    spironolactone (ALDACTONE) 25 MG tablet Take 1 tablet (25 mg total) by mouth daily. 90 tablet 3    triamcinolone (KENALOG) 0.1 % cream Apply topically to active, raised areas twice daily as needed. 80 g 3    blood-glucose meter kit Use as instructed 1 each 0    gabapentin (NEURONTIN) 300 MG capsule Take 2 capsules (600 mg total) by mouth two (2) times a day. 360 capsule 3    tirzepatide (MOUNJARO) 12.5 mg/0.5 mL PnIj Inject 12.5 mg under the skin every seven (7) days. 2 mL 3    topiramate (TOPAMAX) 25 MG tablet Take 1 tablet (25 mg total) by mouth nightly. 60 tablet 1     No current facility-administered medications for this visit.       I have reviewed and (if needed) updated the patient's problem list, medications, allergies, past medical and surgical history, social and family history.    ROS:     Unless otherwise stated in the HPI:  CONSTITUTIONAL: no fever chills.   HEENT: Eyes: No diplopia or blurred vision. ENT: No earache, sore throat or runny nose.   CARDIOVASCULAR: No pressure, squeezing, strangling, tightness, heaviness or aching about the chest, neck, axilla or epigastrium.   RESPIRATORY: No cough, shortness of breath, PND or orthopnea.   GASTROINTESTINAL: No nausea, vomiting or diarrhea.   GENITOURINARY: No dysuria, frequency or urgency.   MUSCULOSKELETAL: no new pains, no joint swelling or redness.   SKIN: No change in skin, hair or nails.   NEUROLOGIC: No paresthesias, fasciculations, seizures or weakness.   PSYCHIATRIC: No disorder of thought or mood.   ENDOCRINE: No heat or cold intolerance, polyuria or polydipsia.   HEMATOLOGICAL: No easy bruising or bleeding.    Vital Signs:     Body mass index is 55.42 kg/m??. Waist Circumference: 50 inches    Wt Readings from Last 3 Encounters:   04/30/22 (!) 137.4 kg (303 lb)   04/01/22 (!) 137.4 kg (303 lb)   02/13/22 (!) 138.8 kg (306 lb)     Temp Readings from Last 3 Encounters:   04/30/22 35.9 ??C (96.6 ??F)   04/01/22 36.6 ??C (97.8 ??F)   01/08/22 36.2 ??C (97.2 ??F)     BP Readings from Last 3 Encounters:   04/30/22 139/86   04/01/22 157/88   01/08/22 143/85     Pulse Readings from Last 3 Encounters:   04/30/22 81   04/01/22 80   01/08/22 82         01/08/2022    10:46 AM 04/30/2022     1:54 PM   Waist Circumference   Waist Circumference 50 inches 50 inches          Physical Exam:     General: well appearing, in NAD, Body mass index is 55.42 kg/m??. Ambulatory without help.  Body fat distribution: General adiposity. No supraclavicular adiposity. No dorsal adiposity. Waist Circumference: 50 inches     Head: normocephalic atraumatic.  Eyes: PERRLA, EOMI, Sclera WNL.   Oral pharynx: moist, no exudate, no erythema, not enlarged. Good dental hygiene. Moderate OP crowding.  Neck: supple, no LAD, no thyromegaly, no bruit.  CV: RRR no rubs or murmurs. Peripheral edema: none.  Lungs: clear bilaterally to auscultation. No wheezing.  Abdomen: soft, NT/ND.  No intertrigo. Moderate pannus. No hepatomegaly.   Extremities: no clubbing, cyanosis, No edema.  Skin: no concerning lesions, rashes observed. No lipomas, no acanthosis nigricans.  Neuro: Alert and oriented X 3.    Labs:     Abstract on 04/29/2022   Component Date Value Ref Range Status    HM Diabetic Eye Exam 04/19/2022 Negative Retinopathy - Both Eyes  Negative Retinopathy - Both Eyes, Negative Retinopathy - Right Eye, Negative Retinopathy - Left Eye Final   Lab on 04/15/2022   Component Date Value Ref Range Status    Sodium 04/15/2022 140  135 - 145 mmol/L Final    Potassium 04/15/2022 4.2  3.4 - 4.8 mmol/L Final    Chloride 04/15/2022 105  98 - 107 mmol/L Final    CO2 04/15/2022 27.4  20.0 - 31.0 mmol/L Final    Anion Gap 04/15/2022 8  5 - 14 mmol/L Final    BUN 04/15/2022 17  9 - 23 mg/dL Final    Creatinine 16/01/9603 0.95  0.55 - 1.02 mg/dL Final    BUN/Creatinine Ratio 04/15/2022 18   Final    eGFR CKD-EPI (2021) Female 04/15/2022 71  >=60 mL/min/1.42m2 Final    eGFR calculated with CKD-EPI 2021 equation in accordance with [employer] and [employer] of Nephrology Task Force recommendations.    Glucose 04/15/2022 92  70 - 179 mg/dL Final    Calcium 54/12/8117 9.4  8.7 - 10.4 mg/dL Final   Office Visit on 04/01/2022   Component Date Value Ref Range Status    Creat U 04/01/2022 65.3  Undefined mg/dL Final    Albumin Quantitative, Urine 04/01/2022 <0.3  Undefined mg/dL Final    Albumin/Creatinine Ratio 04/01/2022    Final    Unable to calculate.    Sodium 04/01/2022 142  135 - 145 mmol/L Final    Potassium 04/01/2022 4.1  3.4 - 4.8 mmol/L Final    Chloride 04/01/2022 105  98 - 107 mmol/L Final    CO2 04/01/2022 30.4  20.0 - 31.0 mmol/L Final    Anion Gap 04/01/2022 7  5 - 14 mmol/L Final    BUN 04/01/2022 7 (L)  9 - 23 mg/dL Final    Creatinine 14/78/2956 0.87  0.55 - 1.02 mg/dL Final    BUN/Creatinine Ratio 04/01/2022 8   Final    eGFR CKD-EPI (2021) Female 04/01/2022 79  >=60 mL/min/1.33m2 Final    eGFR calculated with CKD-EPI 2021 equation in accordance with [employer] and [employer] of Nephrology Task Force recommendations.    Glucose 04/01/2022 57 (L)  70 - 99 mg/dL Final    Calcium 21/30/8657 9.8  8.7 - 10.4 mg/dL Final       Follow-up:     Return in about 2 months (around 06/29/2022) for Weight clinic F/U one slot..    I attest that I, Constance Goltz, personally documented this note while acting as scribe for Marshell Garfinkel, MD.      Constance Goltz, Scribe.  04/30/2022     The documentation recorded by the scribe accurately reflects the service I personally performed and the decisions made by me.    Marshell Garfinkel, MD

## 2022-04-30 NOTE — Unmapped (Signed)
[facility] Refill Coordination Note    Specialty Medication(s) to be Shipped:   Inflammatory Disorders: Cosentyx    Other medication(s) to be shipped: amlodipine 10 MG tablet, losartan 100 MG tablet, MOUNJARO 12.5 mg/0.5 mL Pnij,simvastatin 20 MG tablet, spironolactone 25 MG tablet, topiramate 25 MG tablet      Tracy Robles, DOB: 10/10/1965  Phone: There are no phone numbers on file.      All above HIPAA information was verified with patient.     Was a Nurse, learning disability used for this call? No    Completed refill call assessment today to schedule patient's medication shipment from the [facility] [phone]).  All relevant notes have been reviewed.     Specialty medication(s) and dose(s) confirmed: Regimen is correct and unchanged.   Changes to medications: Darcey reports no changes at this time.  Changes to insurance: No  New side effects reported not previously addressed with a pharmacist or physician: None reported  Questions for the pharmacist: No    Confirmed patient received a Conservation officer, historic buildings and a Surveyor, mining with first shipment. The patient will receive a drug information handout for each medication shipped and additional FDA Medication Guides as required.       DISEASE/MEDICATION-SPECIFIC INFORMATION        For patients on injectable medications: Patient currently has 0 doses left.  Next injection is scheduled for 1/28.    SPECIALTY MEDICATION ADHERENCE     Medication Adherence    Patient reported X missed doses in the last month: 0  Specialty Medication: Cosentyx  Patient is on additional specialty medications: No                          Were doses missed due to medication being on hold? No    Cosentyx 150 mg/ml: 0 days of medicine on hand        REFERRAL TO PHARMACIST     Referral to the pharmacist: Not needed      [facility]     Shipping address confirmed in Epic.     Delivery Scheduled: Yes, Expected medication delivery date: 05/05/22.     Medication will be delivered via Same Day Courier to the prescription address in Epic WAM.    Willette Pa   [facility] Specialty Technician

## 2022-05-05 MED FILL — COSENTYX PEN 300 MG/2 PENS (150 MG/ML) SUBCUTANEOUS: SUBCUTANEOUS | 28 days supply | Qty: 2 | Fill #2

## 2022-05-16 ENCOUNTER — Ambulatory Visit: Admit: 2022-05-16 | Discharge: 2022-05-17 | Payer: PRIVATE HEALTH INSURANCE

## 2022-05-21 NOTE — Unmapped (Signed)
Assessment: Hypertension    Plan: Continue on current medications. Follow up with primary provider.     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 She has been on BP medications for awhile and here Losartan dose was increased in December.     Objective: BP 136/76

## 2022-05-27 NOTE — Unmapped (Signed)
[facility] Refill Coordination Note    Specialty Medication(s) to be Shipped:   Inflammatory Disorders: Cosentyx    Other medication(s) to be shipped: No additional medications requested for fill at this time     Tracy Robles, DOB: Mar 23, 1966  Phone: There are no phone numbers on file.      All above HIPAA information was verified with patient.     Was a Nurse, learning disability used for this call? No    Completed refill call assessment today to schedule patient's medication shipment from the [facility] [phone]).  All relevant notes have been reviewed.     Specialty medication(s) and dose(s) confirmed: Regimen is correct and unchanged.   Changes to medications: Danele reports no changes at this time.  Changes to insurance: No  New side effects reported not previously addressed with a pharmacist or physician: None reported  Questions for the pharmacist: No    Confirmed patient received a Conservation officer, historic buildings and a Surveyor, mining with first shipment. The patient will receive a drug information handout for each medication shipped and additional FDA Medication Guides as required.       DISEASE/MEDICATION-SPECIFIC INFORMATION        For patients on injectable medications: Patient currently has 0 doses left.  Next injection is scheduled for 06/11/22 .    SPECIALTY MEDICATION ADHERENCE     Medication Adherence    Patient reported X missed doses in the last month: 0  Specialty Medication: COSENTYX PEN (2 PENS) 150 mg/mL  Patient is on additional specialty medications: No  Patient is on more than two specialty medications: No  Any gaps in refill history greater than 2 weeks in the last 3 months: no  Demonstrates understanding of importance of adherence: yes              Were doses missed due to medication being on hold? No    Cosentyx 150 mg/ml: 0 days of medicine on hand        REFERRAL TO PHARMACIST     Referral to the pharmacist: Not needed      [facility]     Shipping address confirmed in Epic.     Delivery Scheduled: Yes, Expected medication delivery date:  06/04/22.     Medication will be delivered via Same Day Courier to the prescription address in Epic WAM.    [name]   [facility] Specialty Technician

## 2022-05-29 ENCOUNTER — Telehealth
Admit: 2022-05-29 | Discharge: 2022-05-30 | Payer: PRIVATE HEALTH INSURANCE | Attending: Family Medicine | Primary: Family Medicine

## 2022-05-29 ENCOUNTER — Ambulatory Visit: Admit: 2022-05-29 | Discharge: 2022-05-30 | Payer: PRIVATE HEALTH INSURANCE

## 2022-05-29 DIAGNOSIS — Z6841 Body Mass Index (BMI) 40.0 and over, adult: Principal | ICD-10-CM

## 2022-05-29 DIAGNOSIS — E1165 Type 2 diabetes mellitus with hyperglycemia: Principal | ICD-10-CM

## 2022-05-29 MED ORDER — TOPIRAMATE 25 MG TABLET
ORAL_TABLET | Freq: Every evening | ORAL | 2 refills | 60 days | Status: CP
Start: 2022-05-29 — End: 2022-07-28

## 2022-05-29 MED ORDER — MOUNJARO 10 MG/0.5 ML SUBCUTANEOUS PEN INJECTOR
SUBCUTANEOUS | 3 refills | 28 days | Status: CP
Start: 2022-05-29 — End: ?

## 2022-05-29 NOTE — Unmapped (Addendum)
Tracy Robles is a 57 y.o. female with Class 3 Obesity, [place] Obesity Staging System (EOSS) Stage 2 obesity due to genetic predisposition due to family history, physical inactivity, and frequent consumption of ultraprocessed foods, sleep disruption (waking up at 3 am for work).     Pt is precontemplative to making lifestyle changes.    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   My recommendations include the following:    Medication: Pt has been unable to get Greggory Keen recently d/t supply shortages. Sending current dose to a different pharmacy. Discussed and decided to increase current dose of topiramate. Prescription sent. Pt verbalized understanding and agreement with plan.      INCREASE topiramate 25 mg once --> twice daily, CONTINUE Mounjaro 10 mg once weekly injections  Current:  - Mounjaro  - topiramate  Tried:  - fen-phen  - Wegovy   - Ozempic  - Metformin  Contraindicated:  - phentermine: elevated BP      Eating Pattern: Continues to vary eating pattern between cooking at home and eating out. Encouraged Pt to try to limit eating out and reduce 1 soda per day and occasional sweet tea. Pt was precontemplative of changing behaviors.     Physical Activity: No current routine. Time and taking care of mother is a major barrier. Previously provided Pt with handout detailing at home workout videos. Goal to try some Walk at Home videos.     Stress: Pt has some good coping mechanisms to deal with stressors including doing puzzles, playing games on phone or making jewelry.     Sleep: Pt has OSA. Sleeping 4 hours with CPAP. Wakes up at 3 am. Takes sister to work at 10 pm then goes to to sleep.     Obesity Surgery: Pt adamantly declines surgery. Pt is not interested in surgery due to hearing about other people having difficulty post-surgery. Pt also expresses concern about caring for mother and that her sister will not assist. (Updated 01/08/22)    Continues to decline adamantly. (Updated 04/30/22)    Medical conditions:  Glaucoma: no  Seizures: no  Medullary thyroid cancer (personal or family hx): no  Multiple Endocrine Neoplasia: no  Palpitations/Tachycardia: no  Chest Pain: no  Headaches/Migraines: no  Nephrolithiasis: no  H/o pancreatitis: no  GERD: no    Prior Surgeries:  Cholecystectomy: no    Birth Control Methods: Post menopause    Liver Health Screening:  Fib-4 Score: 1.00--Cirrhosis less likely.

## 2022-05-29 NOTE — Unmapped (Signed)
[facility] Follow Up    TeleHealth Encounter  This medical encounter was conducted virtually using [email or web address]  TeleHealth protocols.    I have identified myself to the patient and conveyed my credentials to Tracy Robles  In case we get disconnected, patient's phone number is There are no phone numbers on file.   Patient has signed informed consent on file in medical record.  Is there someone else in the room? No.       Assessment/Plan:     No chief complaint on file.      Problem List Items Addressed This Visit          Unprioritized    Class 3 severe obesity with body mass index (BMI) of 50.0 to 59.9 in adult (CMS-HCC)     Tracy Robles is a 57 y.o. female with Class 3 Obesity, [place] Obesity Staging System (EOSS) Stage 2 obesity due to genetic predisposition due to family history, physical inactivity, and frequent consumption of ultraprocessed foods, sleep disruption (waking up at 3 am for work).     Pt is precontemplative to making lifestyle changes.    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   My recommendations include the following:    Medication: Pt has been unable to get Greggory Keen recently d/t supply shortages. Sending current dose to a different pharmacy. Discussed and decided to increase current dose of topiramate. Prescription sent. Pt verbalized understanding and agreement with plan.      INCREASE topiramate 25 mg once --> twice daily, CONTINUE Mounjaro 10 mg once weekly injections  Current:  - Mounjaro  - topiramate  Tried:  - fen-phen  - Wegovy   - Ozempic  - Metformin  Contraindicated:  - phentermine: elevated BP      Eating Pattern: Continues to vary eating pattern between cooking at home and eating out. Encouraged Pt to try to limit eating out and reduce 1 soda per day and occasional sweet tea. Pt was precontemplative of changing behaviors.     Physical Activity: No current routine. Time and taking care of mother is a major barrier. Previously provided Pt with handout detailing at home workout videos. Goal to try some Walk at Home videos.     Stress: Pt has some good coping mechanisms to deal with stressors including doing puzzles, playing games on phone or making jewelry.     Sleep: Pt has OSA. Sleeping 4 hours with CPAP. Wakes up at 3 am. Takes sister to work at 10 pm then goes to to sleep.     Obesity Surgery: Pt adamantly declines surgery. Pt is not interested in surgery due to hearing about other people having difficulty post-surgery. Pt also expresses concern about caring for mother and that her sister will not assist. (Updated 01/08/22)    Continues to decline adamantly. (Updated 04/30/22)    Medical conditions:  Glaucoma: no  Seizures: no  Medullary thyroid cancer (personal or family hx): no  Multiple Endocrine Neoplasia: no  Palpitations/Tachycardia: no  Chest Pain: no  Headaches/Migraines: no  Nephrolithiasis: no  H/o pancreatitis: no  GERD: no    Prior Surgeries:  Cholecystectomy: no    Birth Control Methods: Post menopause    Liver Health Screening:  Fib-4 Score: 1.00--Cirrhosis less likely.         Type 2 diabetes mellitus with hyperglycemia, without long-term current use of insulin (CMS-HCC) - Primary     Reviewed relevant medications and labs. Pt is following our Weight Management Clinic. See Obesity tab for medication changes.          Relevant Medications    tirzepatide (MOUNJARO) 10 mg/0.5 mL PnIj        Return in about 2 months (around 07/28/2022) for Weight clinic F/U one slot..      The patient reports they are physically located in [place] and is currently: at home. I conducted a audio/video visit. I spent 15 minutes on the video call with the patient. I spent an additional 10 minutes on pre- and post-visit activities on the date of service. We began that        HPI:     Tracy Robles is a 57 y.o. female who presents for a telehealth Encounter for [facility] follow up.    Weight Management History    Wt Readings from Last 6 Encounters:   04/30/22 (!) 137.4 kg (303 lb)   04/01/22 (!) 137.4 kg (303 lb)   02/13/22 (!) 138.8 kg (306 lb)   01/08/22 (!) 140.6 kg (310 lb 0.6 oz)   12/30/21 (!) 142 kg (313 lb)   09/26/21 (!) 149.2 kg (329 lb)     Update: Doing well overall.   Medication: Taking Mounjaro as directed. Pt hasn't started 12.5 mg d/t medication being on backorder. It has been almost a month since she took it. Pt taking topiramate as well. Denies s/e.   Eating Pattern: Pt is drinking 1 soda/day. Eating out once a week on average. Bringing frozen meals to work.  Physical Activity: Pt hurt her wrist but it has now improved. Pt doing walking 1x/week. Often quite tired after getting home from work.   Barrier: None.    Lab Results   Component Value Date    LDL Calculated 78 08/19/2021    Hemoglobin A1C 5.8 12/30/2021       Past Medical/Surgical History:     Past Medical History:   Diagnosis Date    Allergic Don't know    Penicillin    Arthritis 2011    Lower back    BMI 60.0-69.9, adult (CMS-HCC) 05/29/2021    Diabetes mellitus (CMS-HCC)     Eczema     High blood pressure     Psoriasis      No past surgical history on file.    Social History:     Social History     Socioeconomic History    Marital status: Widowed     Spouse name: None    Number of children: None    Years of education: None    Highest education level: None   Tobacco Use    Smoking status: Never    Smokeless tobacco: Never   Vaping Use    Vaping status: Never Used   Substance and Sexual Activity    Alcohol use: Yes     Alcohol/week: 1.0 standard drink of alcohol     Types: 1 Cans of beer per week     Comment: Only on Saturday    Drug use: Never    Sexual activity: Not Currently  Partners: Male     Birth control/protection: None   Other Topics Concern    Do you use sunscreen? No    Tanning bed use? No    Are you easily burned? No    Excessive sun exposure? No    Blistering sunburns? No     Social Determinants of Health     Food Insecurity: No Food Insecurity (08/01/2021)    Hunger Vital Sign     Worried About Running Out of Food in the Last Year: Never true     Ran Out of Food in the Last Year: Never true       Family History:     Family History   Problem Relation Age of Onset    Diabetes Father     Hypertension Father     Kidney disease Father     Clotting disorder Father         Blood clot    Asthma Father     Diabetes Sister     Miscarriages / Stillbirths Sister     Hypertension Mother     Arthritis Mother     Breast cancer Maternal Aunt     Stroke Maternal Grandmother     Miscarriages / Stillbirths Maternal Grandmother     Melanoma Neg Hx     Basal cell carcinoma Neg Hx     Squamous cell carcinoma Neg Hx     Ovarian cancer Neg Hx        Allergies:     Lisinopril, Latex, and Penicillins    Current Medications:     Current Outpatient Medications   Medication Sig Dispense Refill    amlodipine (NORVASC) 10 MG tablet Take 1 tablet (10 mg total) by mouth daily. 90 tablet 3    biotin 1 mg cap Take by mouth.      blood sugar diagnostic (GLUCOSE BLOOD) Strp Check blood sugar as directed once a day and for symptoms of high or low blood sugar. 100 strip 3    blood-glucose meter kit Use as instructed 1 each 0    cetirizine (ZYRTEC) 5 MG tablet Take 1 tablet (5 mg total) by mouth once as needed.      clobetasoL (TEMOVATE) 0.05 % external solution Apply topically to scalp twice daily. 50 mL 6    clobetasol (TEMOVATE) 0.05 % ointment Apply topically to rash on body twice daily. 60 g 6    gabapentin (NEURONTIN) 300 MG capsule Take 2 capsules (600 mg total) by mouth two (2) times a day. 360 capsule 3 hydroCHLOROthiazide (HYDRODIURIL) 25 MG tablet Take 1 tablet (25 mg total) by mouth daily. 90 tablet 3    loratadine (CLARITIN) 10 mg tablet Take 1 tablet (10 mg total) by mouth daily as needed for allergies.      losartan (COZAAR) 100 MG tablet Take 1 tablet (100 mg total) by mouth daily. 90 tablet 3    multivit with min-folic acid (MULTIVITAMIN GUMMIES) 200 mcg Chew Chew.      ONETOUCH DELICA PLUS LANCET 33 gauge Misc CHECK BLOOD SUGAR AS DIRECTED ONCE A DAY AND FOR SYMPTOMS OF HIGH OR LOW BLOOD SUGAR      secukinumab (COSENTYX PEN, 2 PENS,) 150 mg/mL PnIj injection Inject the contents of 2 pens (300 mg total) under the skin every twenty-eight (28) days. 2 mL 11    simvastatin (ZOCOR) 20 MG tablet Take 1 tablet (20 mg total) by mouth nightly. 90 tablet 3    spironolactone (ALDACTONE) 25 MG tablet  Take 1 tablet (25 mg total) by mouth daily. 90 tablet 3    tirzepatide (MOUNJARO) 10 mg/0.5 mL PnIj Inject 10 mg under the skin every seven (7) days. 2 mL 3    topiramate (TOPAMAX) 25 MG tablet Take 2 tablets (50 mg total) by mouth nightly. 120 tablet 2    triamcinolone (KENALOG) 0.1 % cream Apply topically to active, raised areas twice daily as needed. 80 g 3     No current facility-administered medications for this visit.       I have reviewed and (if needed) updated the patient's problem list, medications, allergies, past medical and surgical history, social and family history.    ROS:     Unless otherwise stated in the HPI:  CONSTITUTIONAL: no fever chills.   HEENT: Eyes: No diplopia or blurred vision. ENT: No earache, sore throat or runny nose.   CARDIOVASCULAR: No pressure, squeezing, strangling, tightness, heaviness or aching about the chest, neck, axilla or epigastrium.   RESPIRATORY: No cough, shortness of breath, PND or orthopnea.   GASTROINTESTINAL: No nausea, vomiting or diarrhea.   GENITOURINARY: No dysuria, frequency or urgency.   MUSCULOSKELETAL: no new pains, no joint swelling or redness.   SKIN: No change in skin, hair or nails.   NEUROLOGIC: No paresthesias, fasciculations, seizures or weakness.   PSYCHIATRIC: No disorder of thought or mood.   ENDOCRINE: No heat or cold intolerance, polyuria or polydipsia.   HEMATOLOGICAL: No easy bruising or bleeding.    Vital Signs:     Self reported vitals:      Physical Exam:     As part of this Virtual Visit, no in-person exam was conducted.  Video interaction permitted the following observations.    GEN: No acute distress.   PSYCH: Appropriate affect, normal mood.    Labs:     Abstract on 04/29/2022   Component Date Value Ref Range Status    HM Diabetic Eye Exam 04/19/2022 Negative Retinopathy - Both Eyes  Negative Retinopathy - Both Eyes, Negative Retinopathy - Right Eye, Negative Retinopathy - Left Eye Final       Follow-up:     Return in about 2 months (around 07/28/2022) for Weight clinic F/U one slot..    I attest that I, Constance Goltz, personally documented this note while acting as scribe for Marshell Garfinkel, MD.      Constance Goltz, Scribe.  05/29/2022     The documentation recorded by the scribe accurately reflects the service I personally performed and the decisions made by me.    Marshell Garfinkel, MD

## 2022-06-03 NOTE — Unmapped (Signed)
Reviewed relevant medications and labs. Pt is following our Weight Management Clinic. See Obesity tab for medication changes.

## 2022-06-04 MED FILL — COSENTYX PEN 300 MG/2 PENS (150 MG/ML) SUBCUTANEOUS: SUBCUTANEOUS | 28 days supply | Qty: 2 | Fill #3

## 2022-06-09 NOTE — Unmapped (Signed)
Assessment and Plan:     Type 2 diabetes mellitus with hyperglycemia, without long-term current use of insulin (CMS-HCC)  -     empagliflozin; Take 1 tablet (10 mg total) by mouth daily.    Primary hypertension    Class 3 severe obesity due to excess calories with serious comorbidity and body mass index (BMI) of 50.0 to 59.9 in adult (CMS-HCC)      Diagnosis above reviewed with patient  Discussed patient could try Jardiance if covered by her insurance instead of Mounjaro. Benefits vs risks reviewed. Patient agreeable to this plan  Suggested patient could check with [facility] in [place] or [facility] in [place] to see if they have Mounjaro  Reviewed phentermine contraindicated 2/2 elevated BP.   Advised patient to further consider surgery for weight loss. Discussed benefits vs risks of surgery. Patient hesitant given personal experience with prior surgeries  Counseled patient on genetic, environmental, and lifestyle factors influencing weight and multifaceted nature of obesity as a disease. Discussed weight reduction is more important for overall health and improving chronic conditions vs being small.  Continue current medication management  Rx as per orders; reviewed risks/benefits, possible side effects and patient verbalizes understanding  START Jardiance 10 mg daily   Recommend supportive treatment with lifestyle modifications with diet/exercise  Discussed exercise helps with boosting metabolism. Advised patient to exercise as tolerated even if it's just a little bit every day  Discussed certain foods indirectly help with boosting metabolism. Encouraged patient to increase protein and healthy fat intake  Follow up in 4 months or sooner prn      Barriers to recommended plan: None identified    I provided an intervention for the Physical Activity SDOH domain. The intervention was Counseling       I personally spent 30 minutes face-to-face and non-face-to-face in the care of this patient, which includes all pre, intra, and post visit time (reviewing, evaluating and discussing pertinent records for patient's care) on the date of service.     Return in about 4 months (around 10/11/2022) for 40 minute follow up CDM.    Subjective:     HPI: Tracy Robles is a 57 y.o. female here for BP recheck. Previously seeing PCP [facility]. Patient with h/o hypertension, obesity, T2DM.    Hypertension - Patient with history of hypertension manages with amlodipine 10 mg daily, losartan 100 mg daily, and spironolactone 25 mg daily. Blood pressure at triage today 129/82. Denies CP, SOB, L/D or peripheral edema.    BP Readings from Last 3 Encounters:  06/12/22 : 129/82  05/21/22 : 136/76  04/30/22 : 139/86    T2DM and weight management - Followed by Dr. Loa Socks for weight management and RD Leonette Most. Patient taking Mounjaro 10 mg weekly injection and topiramate 25 mg BID. Last A1C was 5.8 on 12/30/2021. Patient's ideal weight is 200 lbs. Per patient her highest weight was 384 lbs but started to have back pain when she was losing weight. She does not feel like she needs to gain the weight back to stop her back pain.     Patient reports she gained 7 lbs since she has not been taking Mounjaro 2/2 supply chain issues. She states she has tried calling around to different pharmacies but none of them have Mounjaro. Per pt Dr. Loa Socks recommended she restart metformin but she cannot take this since she does not have access to a bathroom and metformin causes her to have a sudden BM. Patient  also reports SE GI upset with topiramate and states she will have to use the bathroom at least twice every morning before she leaves her house. Patient works every day except for Thursdays and Sundays. 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 Patient reports she was told she has a slow metabolism and is wondering how to boost this.       ROS:   Review of Systems     Review of systems negative unless otherwise noted as per HPI.      The following portions of the patient's history were reviewed and updated as appropriate: allergies, current medications, past family history, past medical history, past social history, past surgical history and problem list.     Objective:     Vitals:    06/12/22 1104   BP: 129/82   Pulse: 81   Temp: 36.5 ??C (97.7 ??F)     Body mass index is 56.7 kg/m??.    Physical Exam  Vitals and nursing note reviewed.   Constitutional:       General: She is not in acute distress.     Appearance: Normal appearance. She is not ill-appearing or toxic-appearing.   HENT:      Head: Normocephalic and atraumatic.   Cardiovascular:      Rate and Rhythm: Normal rate and regular rhythm.      Heart sounds: Normal heart sounds.   Pulmonary:      Effort: Pulmonary effort is normal. No respiratory distress.      Breath sounds: Normal breath sounds.   Musculoskeletal:      Cervical back: Neck supple.   Neurological:      Mental Status: She is alert.        Allergies:     Lisinopril, Latex, and Penicillins    [facility]:     Medication adherence and barriers to the treatment plan have been addressed. Opportunities to optimize healthy behaviors have been discussed. Patient / caregiver voiced understanding.      I attest that I, Zuling F Quade, personally documented this note while acting as scribe for Payton Mccallum, MD.      Ranee Gosselin, Scribe.  06/12/2022     The documentation recorded by the scribe accurately reflects the service I personally performed and the decisions made by me.    Payton Mccallum, MD

## 2022-06-12 ENCOUNTER — Ambulatory Visit: Admit: 2022-06-12 | Discharge: 2022-06-13 | Payer: PRIVATE HEALTH INSURANCE

## 2022-06-12 DIAGNOSIS — E1165 Type 2 diabetes mellitus with hyperglycemia: Principal | ICD-10-CM

## 2022-06-12 DIAGNOSIS — I10 Essential (primary) hypertension: Principal | ICD-10-CM

## 2022-06-12 DIAGNOSIS — Z6841 Body Mass Index (BMI) 40.0 and over, adult: Principal | ICD-10-CM

## 2022-06-12 MED ORDER — EMPAGLIFLOZIN 10 MG TABLET
ORAL_TABLET | Freq: Every day | ORAL | 3 refills | 90 days | Status: CP
Start: 2022-06-12 — End: ?
  Filled 2022-07-14: qty 90, 90d supply, fill #0

## 2022-06-24 DIAGNOSIS — L409 Psoriasis, unspecified: Principal | ICD-10-CM

## 2022-06-24 NOTE — Unmapped (Signed)
Tracy Robles reports overall her psoriasis remains controlled - she sometimes has minor flares after each injection but this quickly resolves. She isn't using topical steroids - those haven't been helpful for her in the past.      She reports she hasn't heard about her referral to rheum - I'll see if derm clinic can re-place this?    Ragad reports some injection site reaction. We reviewed treatment if desired.      [facility] Clinical Assessment & Refill Coordination Note    Tracy Robles, [place]: 19-Dec-1965  Phone: There are no phone numbers on file.    All above HIPAA information was verified with patient.     Was a Nurse, learning disability used for this call? No    Specialty Medication(s):   Inflammatory Disorders: Cosentyx     Current Outpatient Medications   Medication Sig Dispense Refill    amlodipine (NORVASC) 10 MG tablet Take 1 tablet (10 mg total) by mouth daily. 90 tablet 3    biotin 1 mg cap Take by mouth.      blood sugar diagnostic (GLUCOSE BLOOD) Strp Check blood sugar as directed once a day and for symptoms of high or low blood sugar. 100 strip 3    blood-glucose meter kit Use as instructed 1 each 0    cetirizine (ZYRTEC) 5 MG tablet Take 1 tablet (5 mg total) by mouth once as needed.      clobetasoL (TEMOVATE) 0.05 % external solution Apply topically to scalp twice daily. 50 mL 6    clobetasol (TEMOVATE) 0.05 % ointment Apply topically to rash on body twice daily. 60 g 6    empagliflozin (JARDIANCE) 10 mg tablet Take 1 tablet (10 mg total) by mouth daily. 90 tablet 3    gabapentin (NEURONTIN) 300 MG capsule Take 2 capsules (600 mg total) by mouth two (2) times a day. 360 capsule 3    hydroCHLOROthiazide (HYDRODIURIL) 25 MG tablet Take 1 tablet (25 mg total) by mouth daily. 90 tablet 3    loratadine (CLARITIN) 10 mg tablet Take 1 tablet (10 mg total) by mouth daily as needed for allergies.      losartan (COZAAR) 100 MG tablet Take 1 tablet (100 mg total) by mouth daily. 90 tablet 3 multivit with min-folic acid (MULTIVITAMIN GUMMIES) 200 mcg Chew Chew.      ONETOUCH DELICA PLUS LANCET 33 gauge Misc CHECK BLOOD SUGAR AS DIRECTED ONCE A DAY AND FOR SYMPTOMS OF HIGH OR LOW BLOOD SUGAR      secukinumab (COSENTYX PEN, 2 PENS,) 150 mg/mL PnIj injection Inject the contents of 2 pens (300 mg total) under the skin every twenty-eight (28) days. 2 mL 11    simvastatin (ZOCOR) 20 MG tablet Take 1 tablet (20 mg total) by mouth nightly. 90 tablet 3    spironolactone (ALDACTONE) 25 MG tablet Take 1 tablet (25 mg total) by mouth daily. 90 tablet 3    tirzepatide (MOUNJARO) 10 mg/0.5 mL PnIj Inject 10 mg under the skin every seven (7) days. 2 mL 3    topiramate (TOPAMAX) 25 MG tablet Take 2 tablets (50 mg total) by mouth nightly. 120 tablet 2    triamcinolone (KENALOG) 0.1 % cream Apply topically to active, raised areas twice daily as needed. 80 g 3     No current facility-administered medications for this visit.        Changes to medications: Bridgit reports no changes at this time.    Allergies  Allergen Reactions    Lisinopril Cough    Latex      Other reaction(s): UNKNOWN    Penicillins      Other reaction(s): UNSPECIFIED       Changes to allergies: No    SPECIALTY MEDICATION ADHERENCE     Cosentyx - 0 left  Medication Adherence    Patient reported X missed doses in the last month: 0  Specialty Medication: Cosentyx          Specialty medication(s) dose(s) confirmed: Regimen is correct and unchanged.     Are there any concerns with adherence? No    Adherence counseling provided? Not needed    CLINICAL MANAGEMENT AND INTERVENTION      Clinical Benefit Assessment:    Do you feel the medicine is effective or helping your condition? Yes    Clinical Benefit counseling provided? Not needed    Adverse Effects Assessment:    Are you experiencing any side effects? Yes, patient reports experiencing minor injection site reaction. Side effect counseling provided: discussed use of steroid cream, using antihistamine as pre-medication 30 mins prior if desired    Are you experiencing difficulty administering your medicine? No    Quality of Life Assessment:    Quality of Life    Rheumatology  Oncology  Dermatology  1. What impact has your specialty medication had on the symptoms of your skin condition (i.e. itchiness, soreness, stinging)?: Tremendous  2. What impact has your specialty medication had on your comfort level with your skin?: Tremendous  Cystic Fibrosis          How many days over the past month did your psoriasis  keep you from your normal activities? For example, brushing your teeth or getting up in the morning. 0    Have you discussed this with your provider? Not needed    Acute Infection Status:    Acute infections noted within Epic:  No active infections  Patient reported infection: None    Therapy Appropriateness:    Is therapy appropriate and patient progressing towards therapeutic goals? Yes, therapy is appropriate and should be continued    DISEASE/MEDICATION-SPECIFIC INFORMATION      For patients on injectable medications: Patient currently has 0 doses left.  Next injection is scheduled for 3/28 (takes around 28th each month).    Chronic Inflammatory Diseases: Have you experienced any flares in the last month? No  Has this been reported to your provider? No    PATIENT SPECIFIC NEEDS     Does the patient have any physical, cognitive, or cultural barriers? No    Is the patient high risk? No    Did the patient require a clinical intervention? No    Does the patient require physician intervention or other additional services (i.e., nutrition, smoking cessation, social work)? No    SOCIAL DETERMINANTS OF HEALTH     At the [facility], we have learned that life circumstances - like trouble affording food, housing, utilities, or transportation can affect the health of many of our patients.   That is why we wanted to ask: are you currently experiencing any life circumstances that are negatively impacting your health and/or quality of life? Patient declined to answer    Social Determinants of Health     Financial Resource Strain: Not on file   Internet Connectivity: Not on file   Food Insecurity: No Food Insecurity (08/01/2021)    Hunger Vital Sign     Worried About Running Out of Food  in the Last Year: Never true     Ran Out of Food in the Last Year: Never true   Tobacco Use: Low Risk  (06/12/2022)    Patient History     Smoking Tobacco Use: Never     Smokeless Tobacco Use: Never     Passive Exposure: Not on file   Housing/Utilities: Not on file   Alcohol Use: Not on file   Transportation Needs: Not on file   Substance Use: Not on file   Health Literacy: Low Risk  (12/12/2020)    Health Literacy     : Never   Physical Activity: Not on file   Interpersonal Safety: Not on file   Stress: Not on file   Intimate Partner Violence: Not on file   Depression: Not at risk (09/26/2021)    PHQ-2     PHQ-2 Score: 0   Social Connections: Not on file       Would you be willing to receive help with any of the needs that you have identified today? Not applicable       SHIPPING     Specialty Medication(s) to be Shipped:   Inflammatory Disorders: Cosentyx    Other medication(s) to be shipped: No additional medications requested for fill at this time     Changes to insurance: No    Patient was informed of new phone menu: No    Delivery Scheduled: Yes, Expected medication delivery date: 3/22.     Medication will be delivered via Same Day Courier to the confirmed prescription address in [facility].    The patient will receive a drug information handout for each medication shipped and additional FDA Medication Guides as required.  Verified that patient has previously received a Conservation officer, historic buildings and a Surveyor, mining.    The patient or caregiver noted above participated in the development of this care plan and knows that they can request review of or adjustments to the care plan at any time.      All of the patient's questions and concerns have been addressed.    Lanney Gins   [facility] Specialty Pharmacist

## 2022-07-04 MED FILL — COSENTYX PEN 300 MG/2 PENS (150 MG/ML) SUBCUTANEOUS: SUBCUTANEOUS | 28 days supply | Qty: 2 | Fill #4

## 2022-07-10 ENCOUNTER — Institutional Professional Consult (permissible substitution): Admit: 2022-07-10 | Discharge: 2022-07-11 | Payer: PRIVATE HEALTH INSURANCE

## 2022-07-10 DIAGNOSIS — Z6841 Body Mass Index (BMI) 40.0 and over, adult: Principal | ICD-10-CM

## 2022-07-10 NOTE — Unmapped (Signed)
Nutrition Consult Note    Referring Provider:  Payton Mccallum, MD    Reason for Referral:  Diabetes    Medical History:  Patient Active Problem List   Diagnosis    Class 3 severe obesity with body mass index (BMI) of 50.0 to 59.9 in adult (CMS-HCC)    Eczema    Primary hypertension    Psoriasis    Sleep apnea    Type 2 diabetes mellitus with hyperglycemia, without long-term current use of insulin (CMS-HCC)    Encounter to establish care    Back pain    Administrative encounter    Ingrown toenail of right foot    Left shoulder pain    Cramp of both lower extremities    Dermatophytosis of foot    Mass of left hand     Present height 157.5 cm (5' 2)  Present weight (!) 140.6 kg (310 lb) (verbal per patient)  Current BMI (!) 56.69    Weight Status Categories:  Underweight:  BMI < 18.5  Normal Weight:  BMI 18.5 - 24.9  Overweight:  BMI 25 - 29.9  Obesity Class I:  BMI 30 - 34.9  Obesity Class II:  BMI 35 - 39.9  Obesity Class III:  BMI ? 40    Weight History:  Wt Readings from Last 6 Encounters:   07/10/22 (!) 140.6 kg (310 lb)   06/12/22 (!) 140.6 kg (310 lb)   04/30/22 (!) 137.4 kg (303 lb)   04/01/22 (!) 137.4 kg (303 lb)   02/13/22 (!) 138.8 kg (306 lb)   01/08/22 (!) 140.6 kg (310 lb 0.6 oz)     Patient weight is rather stable per EMR. Noted that patient has gradually been losing weight and currently has been in a weight plateau.     Allergies:   Allergies   Allergen Reactions    Lisinopril Cough    Latex      Other reaction(s): UNKNOWN    Penicillins      Other reaction(s): UNSPECIFIED     Relevant Medications, Herbs, Supplements:    Current Outpatient Medications:     amlodipine (NORVASC) 10 MG tablet, Take 1 tablet (10 mg total) by mouth daily., Disp: 90 tablet, Rfl: 3    biotin 1 mg cap, Take by mouth., Disp: , Rfl:     blood sugar diagnostic (GLUCOSE BLOOD) Strp, Check blood sugar as directed once a day and for symptoms of high or low blood sugar., Disp: 100 strip, Rfl: 3    blood-glucose meter kit, Use as instructed, Disp: 1 each, Rfl: 0    cetirizine (ZYRTEC) 5 MG tablet, Take 1 tablet (5 mg total) by mouth once as needed., Disp: , Rfl:     clobetasoL (TEMOVATE) 0.05 % external solution, Apply topically to scalp twice daily., Disp: 50 mL, Rfl: 6    clobetasol (TEMOVATE) 0.05 % ointment, Apply topically to rash on body twice daily., Disp: 60 g, Rfl: 6    empagliflozin (JARDIANCE) 10 mg tablet, Take 1 tablet (10 mg total) by mouth daily., Disp: 90 tablet, Rfl: 3    gabapentin (NEURONTIN) 300 MG capsule, Take 2 capsules (600 mg total) by mouth two (2) times a day., Disp: 360 capsule, Rfl: 3    hydroCHLOROthiazide (HYDRODIURIL) 25 MG tablet, Take 1 tablet (25 mg total) by mouth daily., Disp: 90 tablet, Rfl: 3    loratadine (CLARITIN) 10 mg tablet, Take 1 tablet (10 mg total) by mouth daily as needed for allergies., Disp: ,  Rfl:     losartan (COZAAR) 100 MG tablet, Take 1 tablet (100 mg total) by mouth daily., Disp: 90 tablet, Rfl: 3    multivit with min-folic acid (MULTIVITAMIN GUMMIES) 200 mcg Chew, Chew., Disp: , Rfl:     ONETOUCH DELICA PLUS LANCET 33 gauge Misc, CHECK BLOOD SUGAR AS DIRECTED ONCE A DAY AND FOR SYMPTOMS OF HIGH OR LOW BLOOD SUGAR, Disp: , Rfl:     secukinumab (COSENTYX PEN, 2 PENS,) 150 mg/mL PnIj injection, Inject the contents of 2 pens (300 mg total) under the skin every twenty-eight (28) days., Disp: 2 mL, Rfl: 11    simvastatin (ZOCOR) 20 MG tablet, Take 1 tablet (20 mg total) by mouth nightly., Disp: 90 tablet, Rfl: 3    spironolactone (ALDACTONE) 25 MG tablet, Take 1 tablet (25 mg total) by mouth daily., Disp: 90 tablet, Rfl: 3    tirzepatide (MOUNJARO) 10 mg/0.5 mL PnIj, Inject 10 mg under the skin every seven (7) days., Disp: 2 mL, Rfl: 3    topiramate (TOPAMAX) 25 MG tablet, Take 2 tablets (50 mg total) by mouth nightly., Disp: 120 tablet, Rfl: 2    triamcinolone (KENALOG) 0.1 % cream, Apply topically to active, raised areas twice daily as needed., Disp: 80 g, Rfl: 3    Do you have any concerns about taking or affording your medications?  None at this time.     Relevant Labs:  Hemoglobin A1C (%)   Date Value   12/30/2021 5.8   08/19/2021 5.9   05/21/2021 5.7      No components found for: LDLCALC   BP Readings from Last 3 Encounters:   06/12/22 129/82   05/21/22 136/76   04/30/22 139/86     Lab Results   Component Value Date    CHOL 141 08/19/2021    CHOL 116 06/07/2020    CHOL 145 06/21/2019     Lab Results   Component Value Date    HDL 46 08/19/2021    HDL 39 (L) 06/07/2020    HDL 57 06/21/2019     Lab Results   Component Value Date    LDL 78 08/19/2021    LDL 63 06/07/2020    LDL 75 06/21/2019     Lab Results   Component Value Date    VLDL 16.8 08/19/2021    VLDL 14.4 06/07/2020    VLDL 13.4 06/21/2019     Lab Results   Component Value Date    CHOLHDLRATIO 3.1 08/19/2021    CHOLHDLRATIO 3.0 06/07/2020    CHOLHDLRATIO 2.5 06/21/2019     Lab Results   Component Value Date    TRIG 84 08/19/2021    TRIG 72 06/07/2020    TRIG 67 06/21/2019     No results found for: VITDTOTAL  No results found for: VITAMINB12    Blood Glucose Readings  Patient noted that she does not currently take her blood sugar unless she feels that she needs too. Patient spike that her blood sugar tends to be rather well controlled and that the highest she has seen in has been 120.     Physical Activity:  Patient noted that she does not currently engage in physical activity that is regimented and regular. Patient noted that she has a gym at work but that she does not always desire to go before or after work and that she does not care of the environment. Patient also noted that she was interested in swimming based exercises but that she first  needs to learn to swim. RD encouraged patient to look into swimming seriously if she thinks it would be a good/ fun fit for her. 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 24-Hour recall/usual intake:    Time Intake   Breakfast skips sometimes, if she does eat it often is something small - sausage+ grits (10 AM)    Lunch hamburger or skips (12 PM)    Dinner (6 PM) bbq sandwich      Other Nutrition Information:  RD called patient to complete her nutrition appointment. During the call patient was agreeable to Belenda Cruise, RD to listen in on appointment as Belenda Cruise RD will be taking over patient nutritional care needs going forward.   RD and patient discussed the importance of eating what patient is craving but in a portion controlled and more mindful manner. RD noted that patient could try split a dessert with a family member or friend, choose the kids size, find out another way to indulge (healthier snack alternative or another activity to engage in), etc. RD reminded patient to consider the why behind a snack - hunger, bored, sad, tired, pleasure, etc. RD also reminded patient that anything can fit into a healthy diet - even cookies and cakes - when they are eaten along side other more nutrient dense foods, they are eaten mindfully/ with intent, and a limited (not restricted) fashion. Patient expressed understanding and noted that she agrees.   Patient noted that she tries not to eat past 8 PM.   Patient noted that she would like to learn how to lose weight, and keep her blood sugars well controlled and her heart healthy. Patient noted that her goal weight would be 201 or 200#. RD suggested that patient try not to overly focus on the number on the scale alone as a measure of success. RD noted that how patients clothes feel, her level of energy, and/or measurements are all valid and helpful ways to determine progress. Patient was agreeable and noted that she believes this to be true as well. She is proud of the progress that she has made overall. RD celebrated patient successes with patient.   Patient has found that the [facility] shot has been a very effective tool for her on this health journey. This medication really helps with the feelings of fullness. However patient noted that she soon will not be able to afford this medication (her insurance is not covering it). Patient also noted that even when her insurance was covering it that she dealt with supply chain issues. Patient disappointment but remains determined to continue her weight loss journey.   Patient noted that she would not be interested in bariatric surgery at this time. RD expressed understanding.     RD reviewed some basic label reading tips with patient.   Total Carbohydrate: 45 grams per meal, 15 to 20 grams per snack   Fiber:  4 grams or more = fiber rich  Added Sugar: less than 5 grams   Protein: 20 to 30 grams per meal  Saturated Fat: limit   Trans Fat: avoid    Health Maintenance Due   Topic Date Due    Pneumococcal Vaccine 0-64 (2 of 2 - PCV) 11/18/2019    COVID-19 Vaccine (4 - 2023-24 season) 12/13/2021    Hemoglobin A1c  06/30/2022     RD reminded patient that  she was due for a HgA1c. Patient expressed understanding.     # of meals/day: 3  # of snacks/day: 0 to 1   Portions: Small to fair to moderate  Dining Out: maybe weekly, not frequently, patient tries to cook mostly at home ([place] food is a favorite choice though)     Beverages: water (40 to 64 oz), soda (not frequently), half sweet tea + half lemonade (only sometimes)   -- Patient noted that she only tends to get sodas when she is out to eat at a place like [employer]. Patient noted that even when/if she does drink a soda she splits it with another person.   -- Patient does not drink coffee.     Dislikes: cottage cheese, bananas (unless on a sandwich), eggs (at them too much as a child), tuna     Fruits: cherries, grapes, pineapple, apples     Starch: white pasta, brown rice, popcorn, chips, grits    Vegetables: green pepper, onions, mushrooms, broccoli,   -- Patient noted that she does not mind vegetables but that some of them cause her indigestion.     Protein: nuts, chicken   -- Patient noted that she likes seafood but the rest of there family does not so she rarely get to indulge.   -- RD noted that patient could use protein supplements like Boost or Ensure to help fill protein gaps in diet.     Sweets: cookies (tends to have a sweet craving vs savory)      Prior nutrition education? has not seen an RD in the past      Who prepares the food at home?  Patient   -- Patient noted that she enjoys cooking.     Who does the grocery shopping? Patient      Stress: very high  --RD encouraged patient to engage in self care as able. Patient noted that she likes to play games on her phone to help her unwind. Patient is not a stress eater generally.   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  RD encouraged patient to start to carry snacks that are healthy and high in protein to help to cover these instances. RD reviewed some ideas with patient. RD also sent patient a list of frozen meals that would be helpful for patient to use as well.     Sleep: poor to okay   Patient noted that she has sleep apnea. Patient tries to sleep for around 8 hours/night but that she likely get closer to 6 or 7.      Eating Pace: average     Appetite: normal to low     Estimated Daily Needs:  1900 calories (Mifflin-St Jeor equation for BMI > 25 using actual body weight, adjusted for weight loss)  110 to 140 g protein  45 g carbohydrate grams per meal, 15 to 20 grams carbohydrate per snack     Nutrition Diagnosis:  Overweight/obesity related to previous limited physical activity and previous consumption of unbalanced meals as evidenced by BMI >30.    Nutrition Intervention:  Nutrition Education  Nutrition Counseling    Materials Provided:  List of recommendations    Tips and recipes    Social Determinants of Health interventions provided: N/A - no concerns identified.     Patient Stated Nutrition Goals:  -- Aim to eat consistent amounts of protein throughout the day. Aim  to 20 to 30 grams per meal.  -- Aim to engage in physical activity as able. RD encouraged patient to engage in seated exercise as able. RD also noted that patient could look into a peddler or a local wellness center.   -- Engage in mindful eating. Aim to eat at similar times of the day and in similar amounts.     Goals Added to Visit Navigator?  Yes    Monitoring/Evaluation:  Progress towards goals:  baseline    Follow-up:  Patient and RD Belenda Cruise will follow up in 4 or so weeks. RD encouraged patient to reach out to this worker with any nutritional questions, concerns, or struggles.     PCP: Payton Mccallum, MD  Address on File: [address], [place] [address], [facility]      Verified as Current Location: Yes  Extended Emergency Contact Information  Primary Emergency Contact: Miles,Jennifer  Address: [address]           [place], [place] [address] [place] Phone: [phone]  Relation: Sister  Secondary Emergency Contact: Stephannie Li, [place]  Mobile Phone: [phone]  Relation: Relative     Verified Behavioral Health Emergency Contact: N/A      The patient reports they are physically located in [place] and is currently: at home. I conducted a phone visit.  I spent 45 minutes on the phone call with the patient on the date of service .     Leonette Most, RD LDN    Length of visit was 45 minutes    3 unit(s) billed per insurance     Leonette Most, RD LDN

## 2022-07-15 NOTE — Unmapped (Addendum)
--   Aim to eat consistent amounts of protein throughout the day. Aim to 20 to 30 grams per meal.  -- Aim to engage in physical activity as able. RD encouraged patient to engage in seated exercise as able. RD also noted that patient could look into a peddler or a local wellness center.   -- Engage in mindful eating. Aim to eat at similar times of the day and in similar amounts.

## 2022-07-17 NOTE — Unmapped (Signed)
RD called patient back. RD was not able to connect with patient at the return call so RD let a vm. RD confirmed that patient can have jerky as a snack but reviewed some ways to ensure that she selects a healthier option.     Leonette Most, RD LDN

## 2022-07-25 NOTE — Unmapped (Signed)
Nutrition Consult Note    Referring Provider:  Self, Referred    Reason for Referral:  Nutrition Counseling    Medical History:  Patient Active Problem List   Diagnosis    Class 3 severe obesity with body mass index (BMI) of 50.0 to 59.9 in adult (CMS-HCC)    Eczema    Primary hypertension    Psoriasis    Sleep apnea    Type 2 diabetes mellitus with hyperglycemia, without long-term current use of insulin (CMS-HCC)    Encounter to establish care    Back pain    Administrative encounter    Ingrown toenail of right foot    Left shoulder pain    Cramp of both lower extremities    Dermatophytosis of foot    Mass of left hand     Present height 157.5 cm (5' 2.01)  Present weight (!) 142.9 kg (315 lb 0.6 oz)   Current BMI (!) 57.61    Weight Status Categories:  Underweight:  BMI < 18.5  Normal Weight:  BMI 18.5 - 24.9  Overweight:  BMI 25 - 29.9  Obesity Class I:  BMI 30 - 34.9  Obesity Class II:  BMI 35 - 39.9  Obesity Class III:  BMI ? 40    Weight History:  Wt Readings from Last 6 Encounters:   08/05/22 (!) 142.9 kg (315 lb 0.6 oz)   08/05/22 (!) 142.9 kg (315 lb)   07/10/22 (!) 140.6 kg (310 lb)   06/12/22 (!) 140.6 kg (310 lb)   04/30/22 (!) 137.4 kg (303 lb)   04/01/22 (!) 137.4 kg (303 lb)   Noted, patient weight trending upwards x 4 months per EMR.     Allergies:   Allergies   Allergen Reactions    Lisinopril Cough    Latex      Other reaction(s): UNKNOWN    Penicillins      Other reaction(s): UNSPECIFIED     Relevant Medications, Herbs, Supplements:    Current Outpatient Medications:     amlodipine (NORVASC) 10 MG tablet, Take 1 tablet (10 mg total) by mouth daily., Disp: 90 tablet, Rfl: 3    biotin 1 mg cap, Take by mouth., Disp: , Rfl:     blood sugar diagnostic (GLUCOSE BLOOD) Strp, Check blood sugar as directed once a day and for symptoms of high or low blood sugar., Disp: 100 strip, Rfl: 3    blood-glucose meter kit, Use as instructed, Disp: 1 each, Rfl: 0    cetirizine (ZYRTEC) 5 MG tablet, Take 1 tablet (5 mg total) by mouth once as needed., Disp: , Rfl:     clobetasoL (TEMOVATE) 0.05 % external solution, Apply topically to scalp twice daily., Disp: 50 mL, Rfl: 6    clobetasol (TEMOVATE) 0.05 % ointment, Apply topically to rash on body twice daily., Disp: 60 g, Rfl: 6    empagliflozin (JARDIANCE) 10 mg tablet, Take 1 tablet (10 mg total) by mouth daily., Disp: 90 tablet, Rfl: 3    gabapentin (NEURONTIN) 300 MG capsule, Take 2 capsules (600 mg total) by mouth two (2) times a day., Disp: 360 capsule, Rfl: 3    hydroCHLOROthiazide (HYDRODIURIL) 25 MG tablet, Take 1 tablet (25 mg total) by mouth daily., Disp: 90 tablet, Rfl: 3    loratadine (CLARITIN) 10 mg tablet, Take 1 tablet (10 mg total) by mouth daily as needed for allergies., Disp: , Rfl:     losartan (COZAAR) 100 MG tablet, Take 1 tablet (100 mg  total) by mouth daily., Disp: 90 tablet, Rfl: 3    multivit with min-folic acid (MULTIVITAMIN GUMMIES) 200 mcg Chew, Chew., Disp: , Rfl:     ONETOUCH DELICA PLUS LANCET 33 gauge Misc, CHECK BLOOD SUGAR AS DIRECTED ONCE A DAY AND FOR SYMPTOMS OF HIGH OR LOW BLOOD SUGAR, Disp: , Rfl:     secukinumab (COSENTYX PEN, 2 PENS,) 150 mg/mL PnIj injection, Inject the contents of 2 pens (300 mg total) under the skin every twenty-eight (28) days., Disp: 2 mL, Rfl: 11    simvastatin (ZOCOR) 20 MG tablet, Take 1 tablet (20 mg total) by mouth nightly., Disp: 90 tablet, Rfl: 3    spironolactone (ALDACTONE) 25 MG tablet, Take 1 tablet (25 mg total) by mouth daily., Disp: 90 tablet, Rfl: 3    tirzepatide (MOUNJARO) 10 mg/0.5 mL PnIj, Inject 10 mg under the skin every seven (7) days., Disp: 2 mL, Rfl: 3    topiramate (TOPAMAX) 25 MG tablet, Take 2 tablets (50 mg total) by mouth nightly., Disp: 120 tablet, Rfl: 2    triamcinolone (KENALOG) 0.1 % cream, Apply topically to active, raised areas twice daily as needed., Disp: 80 g, Rfl: 3    Do you have any concerns about taking or affording your medications? None reported     Relevant Labs:  Hemoglobin A1C (%)   Date Value   12/30/2021 5.8   08/19/2021 5.9   05/21/2021 5.7      No components found for: LDLCALC   BP Readings from Last 3 Encounters:   08/05/22 146/82   06/12/22 129/82   05/21/22 136/76     Lab Results   Component Value Date    CHOL 141 08/19/2021    CHOL 116 06/07/2020    CHOL 145 06/21/2019     Lab Results   Component Value Date    HDL 46 08/19/2021    HDL 39 (L) 06/07/2020    HDL 57 06/21/2019     Lab Results   Component Value Date    LDL 78 08/19/2021    LDL 63 06/07/2020    LDL 75 06/21/2019     Lab Results   Component Value Date    VLDL 16.8 08/19/2021    VLDL 14.4 06/07/2020    VLDL 13.4 06/21/2019     Lab Results   Component Value Date    CHOLHDLRATIO 3.1 08/19/2021    CHOLHDLRATIO 3.0 06/07/2020    CHOLHDLRATIO 2.5 06/21/2019     Lab Results   Component Value Date    TRIG 84 08/19/2021    TRIG 72 06/07/2020    TRIG 67 06/21/2019     No results found for: VITDTOTAL  No results found for: VITAMINB12    Blood Glucose Readings  Patient noted that she does not currently check her blood sugar unless she feels that she needs too. Patient spoke that her blood sugar tends to be rather well controlled and that the highest she has seen in has been 120.     Physical Activity:  Patient does not currently engage in physical activity regularly. She is the primary caregiver to her mother so she is constantly doing things around the home and helping her mother. She is very interested in swimming based exercise for both her and her mother. RD encouraged patient to look into swimming lessons. We also discussed stationary peddlers as well as resistance bands to get more activity in during the day and evenings.  24-Hour recall/usual intake:   Time Intake   Breakfast Sometimes skips. Yesterday had an ultimate bacon breakfast buscuit with arnola palmer tea. Today had bacon sandwich. Snack (AM) Cashews yesterday. Has also been having cheese as a snack while working.     Lunch  (12PM) Sometimes skips. Has not eaten lunch yet today. Yesterday had [employer] - chicken sandwich, fries, sweet tea and lemonade     Snack (PM)    Dinner  (6PM) Did not have dinner yesterday. Got home late from work.    Snack (HS) Had 3 strips of bacon before bed. Wanting to work on not eating past 8pm.       Other Nutrition Information:  RD met with Ms. Smotherman in clinic. She has been working on increasing protein intake, mindful eating, and reducing her intake of sweets.     Portions: Small to fair to moderate    Dining Out:   1-2x weekly (Chinese food is a favorite). She tries to cook most meals at home, does some meal preparation on Sundays.     Beverages:   Water - 40 ounces daily. 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   Soda - not often, typically when going out to eat and tends to not drink it all.  Half sweet tea with half lemonade - discussed trying diet lemonade and only getting a small.  Monster zero sugar energy drink daily at work    Dislikes:   [employer], banana (unless on a sandwich), eggs, tuna    Fruits + Vegetables:   Enjoys cherries, grapes, pineapple, apples, green peppers, onion, mushrooms, broccoli, cucumber, broccoli  She does not mind vegetables, but can cause some indigestion    Starch:   Enjoys white pasta, brown rice, popcorn, chips, grits    Protein:   Chicken, nuts, seafood (not often as family does not like)  - RD previously suggested protein supplements (Boost or Ensure) to help fill protein gaps in diet    Sweets:   Tends to have a sweet craving (vs savory) such as cookies or ice cream. She is actively working on reducing the frequency that she has sweets. We also discussed working on portion control. Discussed more balanced alternatives such as greek yogurt with nut butter and berries frozen as an evening sweet treat.     Who does the grocery shopping and prepares the food at home?    Patient - she enjoys cooking     Stress:   Very high - patient is a main caregiver to her mother and her schedule is unpredictable (bus driver). RD encouraged self care. She enjoys playing games on her phone to help unwind. RD encouraged patient to start to carry snacks that are healthy and high in protein to help to cover these instances. RD reviewed some ideas with patient.     Sleep:   Poor to okay - patient has sleep apnea. Aims for 8 hours/night, but gets closer to 6-7 hours      HISTORY -   RD and patient discussed the importance of eating what patient is craving but in a portion controlled and more mindful manner. RD noted that patient could try split a dessert with a family member or friend, choose the kids size, find out another way to indulge (healthier snack alternative or another activity to engage in), etc.  RD reminded patient to consider the why behind a snack - hunger, bored, sad, tired, pleasure, etc. RD also reminded patient that anything can fit into a healthy diet - even cookies and cakes - when they are eaten along side other more nutrient dense foods, they are eaten mindfully/ with intent, and a limited (not restricted) fashion.     Patient noted that she would like to learn how to lose weight, and keep her blood sugars well controlled and her heart healthy. Patient noted that her goal weight would be 201 or 200#. RD suggested that patient try not to overly focus on the number on the scale alone as a measure of success. RD noted that how patients clothes feel, her level of energy, and/or measurements are all valid and helpful ways to determine progress. She is proud of the progress that she has made overall. RD celebrated patient successes with patient. Patient has found that the [facility] shot has been a very effective tool for her on this health journey by helping with the feeling of fullness. However patient noted that she soon will not be able to afford this medication (her insurance is not covering it). Patient also noted that even when her insurance was covering it that she dealt with supply chain issues. Patient disappointment but remains determined to continue her weight loss journey.   Patient previously noted that she would not be interested in bariatric surgery at this time, RD expressed understanding.     RD reviewed some basic label reading tips with patient.   Total Carbohydrate: 45 grams per meal, 15 to 20 grams per snack   Fiber:  4 grams or more = fiber rich  Added Sugar: less than 5 grams   Protein: 20 to 30 grams per meal  Saturated Fat: limit   Trans Fat: avoid    Health Maintenance Due   Topic Date Due    Pneumococcal Vaccine 0-64 (2 of 2 - PCV) 11/18/2019    COVID-19 Vaccine (4 - 2023-24 season) 12/13/2021    Hemoglobin A1c  06/30/2022    HPV Cotest with Pap Smear (21-65)  10/20/2022    Pap Smear with Cotest HPV (21-65)  10/22/2022   RD reminded patient that she was due for a HgA1c. Patient expressed understanding.     Estimated Daily Needs:  1900 calories (Mifflin-St Jeor equation for BMI > 25 using actual body weight, adjusted for weight loss)  110 to 140 g protein  45 g carbohydrate grams per meal, 15 to 20 grams carbohydrate per snack     Nutrition Diagnosis:  Overweight/obesity related to previous limited physical activity and previous consumption of unbalanced meals as evidenced by BMI >30.    Nutrition Intervention:  Nutrition Education  Nutrition Counseling    Materials Provided:  Sheet pan meal ideas  High protein snack ideas    Social Determinants of Health interventions provided: unable to address at current visit, will re-assess at follow-up.    Patient Stated Nutrition Goals:  (Continue) Aim to eat consistent amounts of protein throughout the day, 20-30 grams per meal - discussed unsalted nuts, cheese sticks    (Continue) Engaging in mindful eating - read labels and be mindful of portion sizes    (New) Add vegetables to our snacks, lunch, and dinner - Discussed packing raw veggies with unsalted nuts. Preparing a sheet pan of vegetables on Sunday to have the the week with dinner meals.    (New) Limit sugar sweetened beverage intake to once weekly -  Try diet lemonade with sweet tea, only get a small size    (New) Work on engaging in physical activity, aim for 30 minutes daily - Discussed at home peddler, resistance bands at bus stops. Look into swim lessons.     -----------------------------    Reminder: Due for Hemoglobin A1C    Goals Added to Visit Navigator?  Yes    Monitoring/Evaluation:  Progress towards goals:   continue  modify    Follow-up:  4 weeks     Length of visit was 63 minutes  4 unit(s) billed per insurance     [name] MS, RD, LDN

## 2022-08-05 ENCOUNTER — Ambulatory Visit
Admit: 2022-08-05 | Discharge: 2022-08-05 | Payer: PRIVATE HEALTH INSURANCE | Attending: Family Medicine | Primary: Family Medicine

## 2022-08-05 ENCOUNTER — Ambulatory Visit: Admit: 2022-08-05 | Discharge: 2022-08-05 | Payer: PRIVATE HEALTH INSURANCE

## 2022-08-05 DIAGNOSIS — Z6841 Body Mass Index (BMI) 40.0 and over, adult: Principal | ICD-10-CM

## 2022-08-05 DIAGNOSIS — E1165 Type 2 diabetes mellitus with hyperglycemia: Principal | ICD-10-CM

## 2022-08-05 MED ORDER — MOUNJARO 10 MG/0.5 ML SUBCUTANEOUS PEN INJECTOR
SUBCUTANEOUS | 3 refills | 28 days | Status: CP
Start: 2022-08-05 — End: ?
  Filled 2022-08-07: qty 2, 28d supply, fill #0

## 2022-08-05 NOTE — Unmapped (Addendum)
(  Continue) Aim to eat consistent amounts of protein throughout the day, 20-30 grams per meal - discussed unsalted nuts, cheese sticks    (Continue) Engaging in mindful eating - read labels and be mindful of portion sizes    (New) Add vegetables to our snacks, lunch, and dinner - Discussed packing raw veggies with unsalted nuts. Preparing a sheet pan of vegetables on Sunday to have the the week with dinner meals.    (New) Limit sugar sweetened beverage intake to once weekly - Try half diet lemonade with half sweet tea, only get a small size    (New) Work on engaging in physical activity, aim for 30 minutes daily - Discussed at home peddler, resistance bands at bus stops. Look into swim lessons.     ---------------------------------------    Reminder : Due for Hemoglobin A1C

## 2022-08-05 NOTE — Unmapped (Signed)
[facility] Follow Up    Assessment/Plan:     Chief Complaint   Patient presents with    Follow-up     Wt mngmt       Problem List Items Addressed This Visit          Unprioritized    Class 3 severe obesity with body mass index (BMI) of 50.0 to 59.9 in adult (CMS-HCC)     Tracy Robles is a 57 y.o. female with Class 3 Obesity, [place] Obesity Staging System (EOSS) Stage 2 obesity due to genetic predisposition due to family history, physical inactivity, and frequent consumption of ultraprocessed foods, sleep disruption (waking up at 3 am for work).     Pt is precontemplative to making lifestyle changes.    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 2) decrease fast foods 3) find mounjaro.  4) walking 15 min/ once a week--pt wishes not to do any more.     I have reviewed the patient's medical history, lifestyle history and labs/tests.   My recommendations include the following:    Medication: mounjaro 10 (back order so off for 3 months). Wishes another script. Cont jardiance.     Current:  - Mounjaro  - topiramate  Tried:  - fen-phen  - Wegovy   - Ozempic  - Metformin  Contraindicated:  - phentermine: elevated BP      Lifestyle Pattern Summary:   - See GOALS above.     Stress: Pt has some good coping mechanisms to deal with stressors including doing puzzles, playing games on phone or making jewelry.     Sleep: Pt has OSA. Sleeping 4 hours with CPAP. Wakes up at 3 am. Takes sister to work at 10 pm then goes to to sleep.     Obesity Surgery: Pt adamantly declines surgery. Pt is not interested in surgery due to hearing about other people having difficulty post-surgery. Pt also expresses concern about caring for mother and that her sister will not assist. (Updated 01/08/22)    Continues to decline adamantly. no  Nephrolithiasis: no  H/o pancreatitis: no  GERD: no    Prior Surgeries:  Cholecystectomy: no    Birth Control Methods: Post menopause    Liver Health Screening:  Fib-4 Score: 1.00--Cirrhosis less likely.         Type 2 diabetes mellitus with hyperglycemia, without long-term current use of insulin (CMS-HCC) - Primary     Reviewed relevant medications and labs. Pt is following our [facility]. See Obesity tab for medication changes.              No follow-ups on file.    I have reviewed and addressed the patient???s adherence and response to prescribed medications. I have identified patient barriers to following the proposed medication and treatment plan, and have noted opportunities to optimize healthy behaviors. I have answered the patient???s questions to satisfaction and the patient voices understanding.    Time: Greater than 50% of this encounter was spent in direct consultation with the patient in evaluation and discussing all of the above. Duration of encounter: 30 minutes.     HPI:     Tracy Robles is a 57 y.o. female who  has a past medical history of Allergic (Don't know),  Arthritis (2011), BMI 60.0-69.9, adult (CMS-HCC) (05/29/2021), Diabetes mellitus (CMS-HCC), Eczema, High blood pressure, and Psoriasis. who presents today for [facility] follow up.    Weight Management History  Wt Readings from Last 6 Encounters:   08/05/22 (!) 142.9 kg (315 lb)   07/10/22 (!) 140.6 kg (310 lb)   06/12/22 (!) 140.6 kg (310 lb)   04/30/22 (!) 137.4 kg (303 lb)   04/01/22 (!) 137.4 kg (303 lb)   02/13/22 (!) 138.8 kg (306 lb)         01/08/2022    10:46 AM 04/30/2022     1:54 PM 08/05/2022     2:48 PM   Waist Circumference   Waist Circumference 50 inches 50 inches 49.8 inches         Update: doing okay. Close to 400lbs 2008.   Medication: jardiance. Mounjaro back order. Doing well. Off mounajro 3 months.   Eating pattern: working with nutritionist. Eating every 5 hours. Not often. Cooking more. Breakfast: bacon sandwich lunch: pack left over, chick fil A nuggets, sunjoy--tea lemonade. Occ soda.   Physical Activity: walk 1 x week.   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     Lab Results   Component Value Date    LDL 78 08/19/2021    HDL 46 08/19/2021    A1C 5.8 12/30/2021    GLU 92 04/15/2022    AST 19 08/19/2021    ALT 17 08/19/2021     Past Medical/Surgical History:     Past Medical History:   Diagnosis Date    Allergic Don't know    Penicillin    Arthritis 2011    Lower back    BMI 60.0-69.9, adult (CMS-HCC) 05/29/2021    Diabetes mellitus (CMS-HCC)     Eczema     High blood pressure     Psoriasis      No past surgical history on file.    Social History:     Social History     Socioeconomic History    Marital status: Widowed     Spouse name: None    Number of children: None    Years of education: None    Highest education level: None   Tobacco Use    Smoking status: Never    Smokeless tobacco: Never   Vaping Use    Vaping status: Never Used   Substance and Sexual Activity    Alcohol use: Yes     Alcohol/week: 1.0 standard drink of alcohol     Types: 1 Cans of beer per week     Comment: Only on Saturday    Drug use: Never    Sexual activity: Not Currently     Partners: Male     Birth control/protection: None   Other Topics Concern    Do you use sunscreen? No    Tanning bed use? No    Are you easily burned? No    Excessive sun exposure? No    Blistering sunburns? No     Social Determinants of Health     Food Insecurity: No Food Insecurity (08/01/2021)    Hunger Vital Sign     Worried About Running Out of Food in the Last Year: Never true     Ran Out of Food in the Last Year: Never true       Family History:     Family History   Problem Relation Age of Onset    Diabetes Father  Hypertension Father     Kidney disease Father     Clotting disorder Father         Blood clot    Asthma Father     Diabetes Sister     Miscarriages / Stillbirths Sister     Hypertension Mother Arthritis Mother     Breast cancer Maternal Aunt     Stroke Maternal Grandmother     Miscarriages / Stillbirths Maternal Grandmother     Melanoma Neg Hx     Basal cell carcinoma Neg Hx     Squamous cell carcinoma Neg Hx     Ovarian cancer Neg Hx        Allergies:     Lisinopril, Latex, and Penicillins    Current Medications:     Current Outpatient Medications   Medication Sig Dispense Refill    amlodipine (NORVASC) 10 MG tablet Take 1 tablet (10 mg total) by mouth daily. 90 tablet 3    biotin 1 mg cap Take by mouth.      blood sugar diagnostic (GLUCOSE BLOOD) Strp Check blood sugar as directed once a day and for symptoms of high or low blood sugar. 100 strip 3    cetirizine (ZYRTEC) 5 MG tablet Take 1 tablet (5 mg total) by mouth once as needed.      clobetasoL (TEMOVATE) 0.05 % external solution Apply topically to scalp twice daily. 50 mL 6    clobetasol (TEMOVATE) 0.05 % ointment Apply topically to rash on body twice daily. 60 g 6    empagliflozin (JARDIANCE) 10 mg tablet Take 1 tablet (10 mg total) by mouth daily. 90 tablet 3    hydroCHLOROthiazide (HYDRODIURIL) 25 MG tablet Take 1 tablet (25 mg total) by mouth daily. 90 tablet 3    loratadine (CLARITIN) 10 mg tablet Take 1 tablet (10 mg total) by mouth daily as needed for allergies.      losartan (COZAAR) 100 MG tablet Take 1 tablet (100 mg total) by mouth daily. 90 tablet 3    multivit with min-folic acid (MULTIVITAMIN GUMMIES) 200 mcg Chew Chew.      ONETOUCH DELICA PLUS LANCET 33 gauge Misc CHECK BLOOD SUGAR AS DIRECTED ONCE A DAY AND FOR SYMPTOMS OF HIGH OR LOW BLOOD SUGAR      secukinumab (COSENTYX PEN, 2 PENS,) 150 mg/mL PnIj injection Inject the contents of 2 pens (300 mg total) under the skin every twenty-eight (28) days. 2 mL 11    simvastatin (ZOCOR) 20 MG tablet Take 1 tablet (20 mg total) by mouth nightly. 90 tablet 3    spironolactone (ALDACTONE) 25 MG tablet Take 1 tablet (25 mg total) by mouth daily. 90 tablet 3    triamcinolone (KENALOG) 0.1 % cream Apply topically to active, raised areas twice daily as needed. 80 g 3    blood-glucose meter kit Use as instructed 1 each 0    gabapentin (NEURONTIN) 300 MG capsule Take 2 capsules (600 mg total) by mouth two (2) times a day. 360 capsule 3    tirzepatide (MOUNJARO) 10 mg/0.5 mL PnIj Inject 10 mg under the skin every seven (7) days. (Patient not taking: Reported on 08/05/2022) 2 mL 3    topiramate (TOPAMAX) 25 MG tablet Take 2 tablets (50 mg total) by mouth nightly. 120 tablet 2     No current facility-administered medications for this visit.       I have reviewed and (if needed) updated the patient's problem list, medications, allergies, past medical and surgical  history, social and family history.    ROS:     Unless otherwise stated in the HPI:  CONSTITUTIONAL: no fever chills.   HEENT: Eyes: No diplopia or blurred vision. ENT: No earache, sore throat or runny nose.   CARDIOVASCULAR: No pressure, squeezing, strangling, tightness, heaviness or aching about the chest, neck, axilla or epigastrium.   RESPIRATORY: No cough, shortness of breath, PND or orthopnea.   GASTROINTESTINAL: No nausea, vomiting or diarrhea.   GENITOURINARY: No dysuria, frequency or urgency.   MUSCULOSKELETAL: no new pains, no joint swelling or redness.   SKIN: No change in skin, hair or nails.   NEUROLOGIC: No paresthesias, fasciculations, seizures or weakness.   PSYCHIATRIC: No disorder of thought or mood.   ENDOCRINE: No heat or cold intolerance, polyuria or polydipsia.   HEMATOLOGICAL: No easy bruising or bleeding.    Vital Signs:     Body mass index is 57.61 kg/m??. Waist Circumference: 49.8 inches    Wt Readings from Last 3 Encounters:   08/05/22 (!) 142.9 kg (315 lb)   07/10/22 (!) 140.6 kg (310 lb)   06/12/22 (!) 140.6 kg (310 lb)     Temp Readings from Last 3 Encounters:   08/05/22 36.3 ??C (97.3 ??F)   06/12/22 36.5 ??C (97.7 ??F)   04/30/22 35.9 ??C (96.6 ??F)     BP Readings from Last 3 Encounters:   08/05/22 146/82   06/12/22 129/82 05/21/22 136/76     Pulse Readings from Last 3 Encounters:   08/05/22 77   06/12/22 81   04/30/22 81         01/08/2022    10:46 AM 04/30/2022     1:54 PM 08/05/2022     2:48 PM   Waist Circumference   Waist Circumference 50 inches 50 inches 49.8 inches          Physical Exam:     General: well appearing, in NAD, Body mass index is 57.61 kg/m??. Ambulatory without help.  Body fat distribution: General adiposity. No supraclavicular adiposity. No dorsal adiposity. Waist Circumference: 49.8 inches     Head: normocephalic atraumatic.  Eyes: PERRLA, EOMI, Sclera WNL.   Oral pharynx: moist, no exudate, no erythema, not enlarged. Good dental hygiene. Moderate OP crowding.  Neck: supple, no LAD, no thyromegaly, no bruit.  CV: RRR no rubs or murmurs. Peripheral edema: none.  Lungs: clear bilaterally to auscultation. No wheezing.  Abdomen: soft, NT/ND. No intertrigo. Moderate pannus. No hepatomegaly.   Extremities: no clubbing, cyanosis, No edema.  Skin: no concerning lesions, rashes observed. No lipomas, no acanthosis nigricans.  Neuro: Alert and oriented X 3.    Labs:     No visits with results within 1 Month(s) from this visit.   Latest known visit with results is:   Abstract on 04/29/2022   Component Date Value Ref Range Status    HM Diabetic Eye Exam 04/19/2022 Negative Retinopathy - Both Eyes  Negative Retinopathy - Both Eyes, Negative Retinopathy - Right Eye, Negative Retinopathy - Left Eye Final       Follow-up:     No follow-ups on file.    I attest that I, Constance Goltz, personally documented this note while acting as scribe for Marshell Garfinkel, MD.      Constance Goltz, Scribe.  08/05/2022     The documentation recorded by the scribe accurately reflects the service I personally performed and the decisions made by me.    Marshell Garfinkel, MD [facility] Mae  Arlana Pouch, Neurosurgeon.  08/05/2022     The documentation recorded by the scribe accurately reflects the service I personally performed and the decisions made by me.    Marshell Garfinkel, MD

## 2022-08-05 NOTE — Unmapped (Signed)
Reviewed relevant medications and labs. Pt is following our Weight Management Clinic. See Obesity tab for medication changes.

## 2022-08-05 NOTE — Unmapped (Addendum)
Tracy Robles is a 58 y.o. female with Class 3 Obesity, [place] Obesity Staging System (EOSS) Stage 2 obesity due to genetic predisposition due to family history, physical inactivity, and frequent consumption of ultraprocessed foods, sleep disruption (waking up at 3 am for work).     Pt is precontemplative to making lifestyle changes.    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 2) decrease fast foods 3) find mounjaro.  4) walking 15 min/ once a week--pt wishes not to do any more.     I have reviewed the patient's medical history, lifestyle history and labs/tests.   My recommendations include the following:    Medication: mounjaro 10 (back order so off for 3 months). Wishes another script. Cont jardiance.     Current:  - Mounjaro  - topiramate  Tried:  - fen-phen  - Wegovy   - Ozempic  - Metformin  Contraindicated:  - phentermine: elevated BP      Lifestyle Pattern Summary:   - See GOALS above.     Stress: Pt has some good coping mechanisms to deal with stressors including doing puzzles, playing games on phone or making jewelry.     Sleep: Pt has OSA. Sleeping 4 hours with CPAP. Wakes up at 3 am. Takes sister to work at 10 pm then goes to to sleep.     Obesity Surgery: Pt adamantly declines surgery. Pt is not interested in surgery due to hearing about other people having difficulty post-surgery. Pt also expresses concern about caring for mother and that her sister will not assist. (Updated 01/08/22)    Continues to decline adamantly. (Updated 04/30/22)    Medical conditions:  Glaucoma: no  Seizures: no  Medullary thyroid cancer (personal or family hx): no  Multiple Endocrine Neoplasia: no  Palpitations/Tachycardia: no  Chest Pain: no  Headaches/Migraines: no  Nephrolithiasis: no  H/o pancreatitis: no  GERD: no    Prior Surgeries:  Cholecystectomy: no    Birth Control Methods: Post menopause    Liver Health Screening:  Fib-4 Score: 1.00--Cirrhosis less likely.

## 2022-08-06 NOTE — Unmapped (Signed)
[facility] Refill Coordination Note    Specialty Medication(s) to be Shipped:   Inflammatory Disorders: Cosentyx    Other medication(s) to be shipped: No additional medications requested for fill at this time     Laniah Burum, DOB: 12-Apr-1966  Phone: There are no phone numbers on file.      All above HIPAA information was verified with patient.     Was a Nurse, learning disability used for this call? No    Completed refill call assessment today to schedule patient's medication shipment from the [facility] [phone]).  All relevant notes have been reviewed.     Specialty medication(s) and dose(s) confirmed: Regimen is correct and unchanged.   Changes to medications: Raahi reports no changes at this time.  Changes to insurance: No  New side effects reported not previously addressed with a pharmacist or physician: None reported  Questions for the pharmacist: No    Confirmed patient received a Conservation officer, historic buildings and a Surveyor, mining with first shipment. The patient will receive a drug information handout for each medication shipped and additional FDA Medication Guides as required.       DISEASE/MEDICATION-SPECIFIC INFORMATION        For patients on injectable medications: Patient currently has 0 doses left.  Next injection is scheduled for 08/10/22.    SPECIALTY MEDICATION ADHERENCE              Were doses missed due to medication being on hold? No    Cosentyx 150 mg/ml: 4 days of medicine on hand       REFERRAL TO PHARMACIST     Referral to the pharmacist: Not needed      [facility]     Shipping address confirmed in Epic.       Delivery Scheduled: Yes, Expected medication delivery date: 08/07/22.     Medication will be delivered via Same Day Courier to the prescription address in Epic WAM.    Sherral Hammers, PharmD   [facility] Specialty Pharmacist

## 2022-08-07 MED FILL — COSENTYX PEN 300 MG/2 PENS (150 MG/ML) SUBCUTANEOUS: SUBCUTANEOUS | 28 days supply | Qty: 2 | Fill #5

## 2022-08-29 NOTE — Unmapped (Signed)
[facility] Refill Coordination Note    Specialty Medication(s) to be Shipped:   Inflammatory Disorders: Cosentyx    Other medication(s) to be shipped:  amlodipine, spironolactone, simvastatin, losartan, [employer], DOB: Sep 08, 1965  Phone: There are no phone numbers on file.      All above HIPAA information was verified with patient.     Was a Nurse, learning disability used for this call? No    Completed refill call assessment today to schedule patient's medication shipment from the [facility] [phone]).  All relevant notes have been reviewed.     Specialty medication(s) and dose(s) confirmed: Regimen is correct and unchanged.   Changes to medications: Ellieanna reports no changes at this time.  Changes to insurance: No  New side effects reported not previously addressed with a pharmacist or physician: None reported  Questions for the pharmacist: No    Confirmed patient received a Conservation officer, historic buildings and a Surveyor, mining with first shipment. The patient will receive a drug information handout for each medication shipped and additional FDA Medication Guides as required.       DISEASE/MEDICATION-SPECIFIC INFORMATION        N/A    SPECIALTY MEDICATION ADHERENCE     Medication Adherence    Patient reported X missed doses in the last month: 0  Specialty Medication: secukinumab (COSENTYX PEN, 2 PENS,) 150 mg/mL PnIj injection  Patient is on additional specialty medications: No  Patient is on more than two specialty medications: No  Any gaps in refill history greater than 2 weeks in the last 3 months: no  Demonstrates understanding of importance of adherence: yes  Informant: patient  Reliability of informant: reliable  Provider-estimated medication adherence level: good  Patient is at risk for Non-Adherence: No  Reasons for non-adherence: no problems identified              Were doses missed due to medication being on hold? No    COSENTYX PEN (2 PENS) 150 mg/mL Pnij injection (secukinumab)  : 0 days of medicine on hand       REFERRAL TO PHARMACIST     Referral to the pharmacist: Not needed      [facility]     Shipping address confirmed in Epic.       Delivery Scheduled: Yes, Expected medication delivery date: 09/03/22.     Medication will be delivered via Same Day Courier to the prescription address in Epic WAM.    Annalissa Murphey' W Dana[facility] Specialty Technician

## 2022-09-02 DIAGNOSIS — L409 Psoriasis, unspecified: Principal | ICD-10-CM

## 2022-09-03 ENCOUNTER — Ambulatory Visit: Admit: 2022-09-03 | Discharge: 2022-09-04 | Payer: PRIVATE HEALTH INSURANCE

## 2022-09-03 DIAGNOSIS — Z6841 Body Mass Index (BMI) 40.0 and over, adult: Principal | ICD-10-CM

## 2022-09-03 MED FILL — MOUNJARO 10 MG/0.5 ML SUBCUTANEOUS PEN INJECTOR: SUBCUTANEOUS | 28 days supply | Qty: 2 | Fill #1

## 2022-09-03 MED FILL — SPIRONOLACTONE 25 MG TABLET: ORAL | 90 days supply | Qty: 90 | Fill #1

## 2022-09-03 MED FILL — SIMVASTATIN 20 MG TABLET: ORAL | 90 days supply | Qty: 90 | Fill #1

## 2022-09-03 MED FILL — AMLODIPINE 10 MG TABLET: ORAL | 90 days supply | Qty: 90 | Fill #1

## 2022-09-03 MED FILL — LOSARTAN 100 MG TABLET: ORAL | 90 days supply | Qty: 90 | Fill #1

## 2022-09-03 NOTE — Unmapped (Signed)
Nutrition Consult Note    Referring Provider:  Self, Referred    Reason for Referral:  Nutrition Counseling    Medical History:  Patient Active Problem List   Diagnosis    Class 3 severe obesity with body mass index (BMI) of 50.0 to 59.9 in adult (CMS-HCC)    Eczema    Primary hypertension    Psoriasis    Sleep apnea    Type 2 diabetes mellitus with hyperglycemia, without long-term current use of insulin (CMS-HCC)    Encounter to establish care    Back pain    Administrative encounter    Ingrown toenail of right foot    Left shoulder pain    Cramp of both lower extremities    Dermatophytosis of foot    Mass of left hand     Present height 157.5 cm (5' 2.01) (taken from chart 4/23)   Present weight (!) 138.8 kg (306 lb)   Current BMI (!) 55.95    Weight Status Categories:  Underweight:  BMI < 18.5  Normal Weight:  BMI 18.5 - 24.9  Overweight:  BMI 25 - 29.9  Obesity Class I:  BMI 30 - 34.9  Obesity Class II:  BMI 35 - 39.9  Obesity Class III:  BMI ? 40    Weight History:  Wt Readings from Last 6 Encounters:   09/03/22 (!) 138.8 kg (306 lb)   08/05/22 (!) 142.9 kg (315 lb 0.6 oz)   08/05/22 (!) 142.9 kg (315 lb)   07/10/22 (!) 140.6 kg (310 lb)   06/12/22 (!) 140.6 kg (310 lb)   04/30/22 (!) 137.4 kg (303 lb)   Noted 9 lbs wt loss x 1 month     Allergies:   Allergies   Allergen Reactions    Lisinopril Cough    Latex      Other reaction(s): UNKNOWN    Penicillins      Other reaction(s): UNSPECIFIED     Relevant Medications, Herbs, Supplements:    Current Outpatient Medications:     amlodipine (NORVASC) 10 MG tablet, Take 1 tablet (10 mg total) by mouth daily., Disp: 90 tablet, Rfl: 3    biotin 1 mg cap, Take by mouth., Disp: , Rfl:     blood sugar diagnostic (GLUCOSE BLOOD) Strp, Check blood sugar as directed once a day and for symptoms of high or low blood sugar., Disp: 100 strip, Rfl: 3    blood-glucose meter kit, Use as instructed, Disp: 1 each, Rfl: 0    cetirizine (ZYRTEC) 5 MG tablet, Take 1 tablet (5 mg total) by mouth once as needed., Disp: , Rfl:     clobetasoL (TEMOVATE) 0.05 % external solution, Apply topically to scalp twice daily., Disp: 50 mL, Rfl: 6    clobetasol (TEMOVATE) 0.05 % ointment, Apply topically to rash on body twice daily., Disp: 60 g, Rfl: 6    empagliflozin (JARDIANCE) 10 mg tablet, Take 1 tablet (10 mg total) by mouth daily., Disp: 90 tablet, Rfl: 3    gabapentin (NEURONTIN) 300 MG capsule, Take 2 capsules (600 mg total) by mouth two (2) times a day., Disp: 360 capsule, Rfl: 3    hydroCHLOROthiazide (HYDRODIURIL) 25 MG tablet, Take 1 tablet (25 mg total) by mouth daily., Disp: 90 tablet, Rfl: 3    loratadine (CLARITIN) 10 mg tablet, Take 1 tablet (10 mg total) by mouth daily as needed for allergies., Disp: , Rfl:     losartan (COZAAR) 100 MG tablet, Take 1 tablet (100  mg total) by mouth daily., Disp: 90 tablet, Rfl: 3    multivit with min-folic acid (MULTIVITAMIN GUMMIES) 200 mcg Chew, Chew., Disp: , Rfl:     ONETOUCH DELICA PLUS LANCET 33 gauge Misc, CHECK BLOOD SUGAR AS DIRECTED ONCE A DAY AND FOR SYMPTOMS OF HIGH OR LOW BLOOD SUGAR, Disp: , Rfl:     secukinumab (COSENTYX PEN, 2 PENS,) 150 mg/mL PnIj injection, Inject the contents of 2 pens (300 mg total) under the skin every twenty-eight (28) days., Disp: 2 mL, Rfl: 11    simvastatin (ZOCOR) 20 MG tablet, Take 1 tablet (20 mg total) by mouth nightly., Disp: 90 tablet, Rfl: 3    spironolactone (ALDACTONE) 25 MG tablet, Take 1 tablet (25 mg total) by mouth daily., Disp: 90 tablet, Rfl: 3    tirzepatide (MOUNJARO) 10 mg/0.5 mL PnIj, Inject the contents of 1 pen (10 mg) under the skin every seven (7) days., Disp: 2 mL, Rfl: 3    topiramate (TOPAMAX) 25 MG tablet, Take 2 tablets (50 mg total) by mouth nightly., Disp: 120 tablet, Rfl: 2    triamcinolone (KENALOG) 0.1 % cream, Apply topically to active, raised areas twice daily as needed., Disp: 80 g, Rfl: 3    Do you have any concerns about taking or affording your medications? None reported Relevant Labs:  Hemoglobin A1C (%)   Date Value   12/30/2021 5.8   08/19/2021 5.9   05/21/2021 5.7      No components found for: LDLCALC   BP Readings from Last 3 Encounters:   08/05/22 146/82   06/12/22 129/82   05/21/22 136/76     Lab Results   Component Value Date    CHOL 141 08/19/2021    CHOL 116 06/07/2020    CHOL 145 06/21/2019     Lab Results   Component Value Date    HDL 46 08/19/2021    HDL 39 (L) 06/07/2020    HDL 57 06/21/2019     Lab Results   Component Value Date    LDL 78 08/19/2021    LDL 63 06/07/2020    LDL 75 06/21/2019     Lab Results   Component Value Date    VLDL 16.8 08/19/2021    VLDL 14.4 06/07/2020    VLDL 13.4 06/21/2019     Lab Results   Component Value Date    CHOLHDLRATIO 3.1 08/19/2021    CHOLHDLRATIO 3.0 06/07/2020    CHOLHDLRATIO 2.5 06/21/2019     Lab Results   Component Value Date    TRIG 84 08/19/2021    TRIG 72 06/07/2020    TRIG 67 06/21/2019     No results found for: VITDTOTAL  No results found for: VITAMINB12    Blood Glucose Readings  Patient does not check her blood sugars, she  noted that when she used to they were well controlled (under 120 mg/dL).     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 She is very interested in swimming based exercise for both her and her mother. RD encouraged patient to look into swimming lessons, patient stated that she will call the [facility] after our visit today. We also discussed stationary peddlers as well as resistance bands to get more activity in during the day and evenings.  24-Hour recall/usual intake:   Time Intake   Breakfast Sometimes skips  Oatmeal (strawberry and cream)  Rice cakes this aM     Snack (AM) Cashews, grapes, cheese - while at work     Lunch  (12PM) Sometimes skips. Patient noted limited appetite recently.      Snack (PM)    Dinner  (6PM) Yesterday Advice worker can of soda     Snack (HS)      Other Nutrition Information:  RD met with Ms. Tolan in clinic for nutrition visit follow-up. She has lost 9 lbs since our last visit (1 month), RD congratulated patient. She has been focusing on reducing overall intake and reducing her intake of sweets. She noted that she has had decreased appetite with some taste alterations and may only eat 1-2x throughout the day. RD explained how it is very difficult to get in our nutrient needs in just 1-2 meals and encouraged patient to have at least 2 balanced meals and a snack. RD educated patient on the importance of protein as we are losing weight to reduce loss of muscle mass. RD suggested greek yogurt, Kodiak waffles/pancakes/oatmeal, Chobani complete yogurt, popcorn, cheese sticks, unsalted nuts.    Portions: small     Dining Out:   1-2x weekly (Chinese food is a favorite). RD suggested adding vegetables to meals when getting take-out to increase fiber content.     Water:   40 ounces daily. Patient noted recent leg cramping she thinks is related to her medications. RD encouraged her to focus on water intake as cramping can be a sign of dehydration. 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 RD brainstormed some possible solutions with patient.     Beverages:  Soda - typically when going out to eat. Encouraged patient to try sparkling water (no added sugar) as an alternative to soda (Sp[employer]rift, Bubbly, [employer]).  Half sweet tea with half lemonade - discussed trying diet lemonade and only getting a small.  Monster zero sugar energy drink daily at work     Alcohol:  Not often, but may occasionally have beer or a mimosa. RD encouraged patient to consume in moderation and to always pair with a meal.    Dislikes: cottage cheese, banana (unless on a sandwich), eggs, tuna, watermelon, peanut butter (recent dislike related to taste changes)    Fruits + Vegetables: cherries, grapes, pineapple, apples, green peppers, onion, mushrooms, cucumber, broccoli, collard greens, turnip greens. Patient notes that some vegetables can cause indigestion.     Starch: white pasta, brown rice, popcorn, chips, grits, rice cakes    Protein: chicken, nuts, seafood (not often as family does not like). RD previously suggested protein supplements (Boost or Ensure) to help fill protein gaps in diet    Sweets:   Tends to have a sweet craving (vs savory). She is actively working on reducing the frequency that she has sweets. Discussed more balanced alternatives such as greek yogurt with nut butter and berries frozen as an evening sweet treat.     Who does the grocery shopping and prepares the food at home? 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 RD encouraged self care. She enjoys playing  games on her phone to help unwind. RD encouraged patient to start to carry snacks that are healthy and high in protein. RD reviewed some ideas with patient.     Sleep:   Poor to okay - patient has sleep apnea. Aims for 8 hours/night, but gets closer to 6-7 hours      HISTORY -   RD and patient discussed the importance of eating what patient is craving but in a portion controlled and more mindful manner. RD noted that patient could try split a dessert with a family member or friend, choose the kids size, find out another way to indulge (healthier snack alternative or another activity to engage in), etc.     RD reminded patient to consider the why behind a snack - hunger, bored, sad, tired, pleasure, etc. RD also reminded patient that anything can fit into a healthy diet - even cookies and cakes - when they are eaten along side other more nutrient dense foods, they are eaten mindfully/ with intent, and a limited (not restricted) fashion.     Patient noted that she would like to learn how to lose weight, and keep her blood sugars well controlled and her heart healthy. Patient noted that her goal weight would be 201 or 200#. RD suggested that patient try not to overly focus on the number on the scale alone as a measure of success. RD noted that how patients clothes feel, her level of energy, and/or measurements are all valid and helpful ways to determine progress. She is proud of the progress that she has made overall. RD celebrated patient successes with patient. Patient has found that the [facility] shot has been a very effective tool for her on this health journey by helping with the feeling of fullness. However patient noted that she soon will not be able to afford this medication (her insurance is not covering it). Patient also noted that even when her insurance was covering it that she dealt with supply chain issues. Patient disappointment but remains determined to continue her weight loss journey.   Patient previously noted that she would not be interested in bariatric surgery at this time, RD expressed understanding.     RD reviewed some basic label reading tips with patient.   Total Carbohydrate: 45 grams per meal, 15 to 20 grams per snack   Fiber:  4 grams or more = fiber rich  Added Sugar: less than 5 grams   Protein: 20 to 30 grams per meal  Saturated Fat: limit   Trans Fat: avoid    Health Maintenance Due   Topic Date Due    Pneumococcal Vaccine 0-64 (2 of 2 - PCV) 11/18/2019    COVID-19 Vaccine (4 - 2023-24 season) 12/13/2021    Hemoglobin A1c  06/30/2022    HPV Cotest with Pap Smear (21-65)  10/20/2022    Pap Smear with Cotest HPV (21-65)  10/22/2022   RD reminded patient that she was due for a HgA1c.     Estimated Daily Needs:  1900 calories (Mifflin-St Jeor equation for BMI > 25 using actual body weight, adjusted for weight loss)  110 g protein (0.8 g/kg)  45 g carbohydrate grams per meal, 15 to 20 grams carbohydrate per snack     Nutrition Diagnosis:  Overweight/obesity related to previous limited physical activity and previous consumption of unbalanced meals as evidenced by BMI >30.    Nutrition Intervention:    Nutrition Counseling    Materials Provided:  No new materials provided  at this time. Supportive listening provided and motivational interviewing conducted.      Social Determinants of Health interventions provided: Unable to address SDOH questions at current visit, however no concerns arose during our visit. Will plan to re-assess at follow-up.    Patient Stated Nutrition Goals:    (Continue) Aim to eat consistent amounts of protein throughout the day, 25-30 grams per meal - discussed unsalted nuts, cheese sticks, Kodiak pancake/waffle/oats, oatmeal, plant-based pasta (Banza, Goodle)    (Continue) Add vegetables to our snacks, lunch, and dinner - Discussed packing raw veggies with unsalted nuts. Preparing a sheet pan of vegetables on Sunday to have the the week with dinner meals.    (Continue) Limit sugar sweetened beverage intake to once weekly - Try diet lemonade with sweet tea, only get a small size. Try sparkling water (no sugar added)     (Continue) Work on engaging in physical activity, aim for 30 minutes daily - Plan to join [employer] to start swim lessons. Discussed at home peddler, resistance bands at bus stops.     -----------------------------  Reminder: Due for Hemoglobin A1C    Goals Added to Visit Navigator?  Yes    Monitoring/Evaluation:   Progress towards goals:   continue  on track  not on track    Follow-up:  6 weeks     Length of visit was 34 minutes  2 unit(s) billed per insurance     [name] MS, RD, LDN

## 2022-09-03 NOTE — Unmapped (Signed)
Tracy Robles 's COSENTYX PEN (2 PENS) 150 mg/mL Pnij injection (secukinumab) shipment will be delayed as a result of prior authorization being required by the patient's insurance.     I have reached out to the patient  at [phone] and left a voicemail message.  We will call the patient back to reschedule the delivery upon resolution. We have not confirmed the new delivery date.

## 2022-09-03 NOTE — Unmapped (Addendum)
(  Continue) Aim to eat consistent amounts of protein throughout the day, 25-30 grams per meal - discussed unsalted nuts, cheese sticks, Kodiak pancake/waffle/oats, oatmeal, plant-based pasta (Banza, Goodle)    (Continue) Add vegetables to our snacks, lunch, and dinner - Discussed packing raw veggies with unsalted nuts. Preparing a sheet pan of vegetables on Sunday to have the the week with dinner meals.    (Continue) Limit sugar sweetened beverage intake to once weekly - Try diet lemonade with sweet tea, only get a small size. Try sparkling water (no sugar added)     (Continue) Work on engaging in physical activity, aim for 30 minutes daily - Plan to join [employer] to start swim lessons. Discussed at home peddler, resistance bands at bus stops.

## 2022-09-04 NOTE — Unmapped (Signed)
Tracy Robles 's Cosentyx shipment will be sent out  as a result of prior authorization now approved.     I have reached out to the patient  at [phone] and communicated the delivery change. We will reschedule the medication for the delivery date that the patient agreed upon.  We have confirmed the delivery date as 09/04/22

## 2022-09-05 MED FILL — COSENTYX PEN 300 MG/2 PENS (150 MG/ML) SUBCUTANEOUS: SUBCUTANEOUS | 28 days supply | Qty: 2 | Fill #6

## 2022-09-11 NOTE — Unmapped (Signed)
Initialed PA on CMM; KEY: [id number] Pending: Approval/Denial     Will make patient aware of PA status via MyChart message.

## 2022-09-11 NOTE — Unmapped (Signed)
Initialed PA for Cosentyx auto-injectors awaiting for insurance to respond with next step.

## 2022-10-01 MED FILL — MOUNJARO 10 MG/0.5 ML SUBCUTANEOUS PEN INJECTOR: SUBCUTANEOUS | 28 days supply | Qty: 2 | Fill #2

## 2022-10-01 MED FILL — COSENTYX PEN 300 MG/2 PENS (150 MG/ML) SUBCUTANEOUS: SUBCUTANEOUS | 28 days supply | Qty: 2 | Fill #7

## 2022-10-06 NOTE — Unmapped (Unsigned)
Assessment and Plan:     There are no diagnoses linked to this encounter.  Multiple diagnoses above reviewed with patient  Continue current medication management  Rx as per orders; reviewed risks/benefits, possible side effects and patient verbalizes understanding  Referrals/labs as per orders above  Recommend supportive treatment with lifestyle modifications with diet/exercise  Follow up in 3-4 months or sooner prn      Barriers to recommended plan: {barrierstocare:74100}    I provided an intervention for the {SDOH OZHYQM:57846} SDOH domain. The intervention was {SDOH INTERVENTION:69735}     PHQ-2 Score:      PHQ-9 Score:      Inocente Salles Score:      {select_status_or_delete_smartlist:64641}    I personally spent *** minutes face-to-face and non-face-to-face in the care of this patient, which includes all pre, intra, and post visit time (reviewing, evaluating and discussing pertinent records for patient's care) on the date of service.     No follow-ups on file.    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 Last seen by me 06/12/2022 for CDM. Pt with h/o HTN, DM2, chronic back pain, psoriasis, obesity.    HPI       ROS:   Review of Systems     Review of systems negative unless otherwise noted as per HPI.      The following portions of the patient's history were reviewed and updated as appropriate: allergies, current medications, past family history, past medical history, past social history, past surgical history and problem list.     Objective:     There were no vitals filed for this visit.  There is no height or weight on file to calculate BMI.    Physical Exam     Allergies:     Lisinopril, Latex, and Penicillins    [facility]:     Medication adherence and barriers to the treatment plan have been addressed. Opportunities to optimize healthy behaviors have been discussed. Patient / caregiver voiced understanding.      I attest that I, Eligah Anello F Makel Mcmann, personally documented this note while acting as scribe for Payton Mccallum, MD.      Ranee Gosselin, Scribe.  10/14/2022     The documentation recorded by the scribe accurately reflects the service I personally performed and the decisions made by me.    Payton Mccallum, MD

## 2022-10-14 NOTE — Unmapped (Unsigned)
Nutrition Consult Note    Referring Provider:  Self, Referred    Reason for Referral:  No chief complaint on file.    Medical History:  Patient Active Problem List   Diagnosis    Class 3 severe obesity with body mass index (BMI) of 50.0 to 59.9 in adult (CMS-HCC)    Eczema    Primary hypertension    Psoriasis    Sleep apnea    Type 2 diabetes mellitus with hyperglycemia, without long-term current use of insulin (CMS-HCC)    Encounter to establish care    Back pain    Administrative encounter    Ingrown toenail of right foot    Left shoulder pain    Cramp of both lower extremities    Dermatophytosis of foot    Mass of left hand     Present height     Present weight     Current BMI      Weight Status Categories:  Underweight:  BMI < 18.5  Normal Weight:  BMI 18.5 - 24.9  Overweight:  BMI 25 - 29.9  Obesity Class I:  BMI 30 - 34.9  Obesity Class II:  BMI 35 - 39.9  Obesity Class III:  BMI ? 40    Weight History:  Wt Readings from Last 6 Encounters:   09/03/22 (!) 138.8 kg (306 lb)   08/05/22 (!) 142.9 kg (315 lb 0.6 oz)   08/05/22 (!) 142.9 kg (315 lb)   07/10/22 (!) 140.6 kg (310 lb)   06/12/22 (!) 140.6 kg (310 lb)   04/30/22 (!) 137.4 kg (303 lb)   Noted 9 lbs wt loss x 1 month     Allergies:   Allergies   Allergen Reactions    Lisinopril Cough    Latex      Other reaction(s): UNKNOWN    Penicillins      Other reaction(s): UNSPECIFIED     Relevant Medications, Herbs, Supplements:    Current Outpatient Medications:     amlodipine (NORVASC) 10 MG tablet, Take 1 tablet (10 mg total) by mouth daily., Disp: 90 tablet, Rfl: 3    biotin 1 mg cap, Take by mouth., Disp: , Rfl:     blood sugar diagnostic (GLUCOSE BLOOD) Strp, Check blood sugar as directed once a day and for symptoms of high or low blood sugar., Disp: 100 strip, Rfl: 3    blood-glucose meter kit, Use as instructed, Disp: 1 each, Rfl: 0    cetirizine (ZYRTEC) 5 MG tablet, Take 1 tablet (5 mg total) by mouth once as needed., Disp: , Rfl:     clobetasoL (TEMOVATE) 0.05 % external solution, Apply topically to scalp twice daily., Disp: 50 mL, Rfl: 6    clobetasol (TEMOVATE) 0.05 % ointment, Apply topically to rash on body twice daily., Disp: 60 g, Rfl: 6    empagliflozin (JARDIANCE) 10 mg tablet, Take 1 tablet (10 mg total) by mouth daily., Disp: 90 tablet, Rfl: 3    gabapentin (NEURONTIN) 300 MG capsule, Take 2 capsules (600 mg total) by mouth two (2) times a day., Disp: 360 capsule, Rfl: 3    hydroCHLOROthiazide (HYDRODIURIL) 25 MG tablet, Take 1 tablet (25 mg total) by mouth daily., Disp: 90 tablet, Rfl: 3    loratadine (CLARITIN) 10 mg tablet, Take 1 tablet (10 mg total) by mouth daily as needed for allergies., Disp: , Rfl:     losartan (COZAAR) 100 MG tablet, Take 1 tablet (100 mg total) by mouth daily., Disp:  90 tablet, Rfl: 3    multivit with min-folic acid (MULTIVITAMIN GUMMIES) 200 mcg Chew, Chew., Disp: , Rfl:     ONETOUCH DELICA PLUS LANCET 33 gauge Misc, CHECK BLOOD SUGAR AS DIRECTED ONCE A DAY AND FOR SYMPTOMS OF HIGH OR LOW BLOOD SUGAR, Disp: , Rfl:     secukinumab (COSENTYX PEN, 2 PENS,) 150 mg/mL PnIj injection, Inject the contents of 2 pens (300 mg total) under the skin every twenty-eight (28) days., Disp: 2 mL, Rfl: 11    simvastatin (ZOCOR) 20 MG tablet, Take 1 tablet (20 mg total) by mouth nightly., Disp: 90 tablet, Rfl: 3    spironolactone (ALDACTONE) 25 MG tablet, Take 1 tablet (25 mg total) by mouth daily., Disp: 90 tablet, Rfl: 3    tirzepatide (MOUNJARO) 10 mg/0.5 mL PnIj, Inject the contents of 1 pen (10 mg) under the skin every seven (7) days., Disp: 2 mL, Rfl: 3    topiramate (TOPAMAX) 25 MG tablet, Take 2 tablets (50 mg total) by mouth nightly., Disp: 120 tablet, Rfl: 2    triamcinolone (KENALOG) 0.1 % cream, Apply topically to active, raised areas twice daily as needed., Disp: 80 g, Rfl: 3    Do you have any concerns about taking or affording your medications? None reported     Relevant Labs:  Hemoglobin A1C (%)   Date Value   12/30/2021 5.8   08/19/2021 5.9   05/21/2021 5.7      No components found for: LDLCALC   BP Readings from Last 3 Encounters:   08/05/22 146/82   06/12/22 129/82   05/21/22 136/76     Lab Results   Component Value Date    CHOL 141 08/19/2021    CHOL 116 06/07/2020    CHOL 145 06/21/2019     Lab Results   Component Value Date    HDL 46 08/19/2021    HDL 39 (L) 06/07/2020    HDL 57 06/21/2019     Lab Results   Component Value Date    LDL 78 08/19/2021    LDL 63 06/07/2020    LDL 75 06/21/2019     Lab Results   Component Value Date    VLDL 16.8 08/19/2021    VLDL 14.4 06/07/2020    VLDL 13.4 06/21/2019     Lab Results   Component Value Date    CHOLHDLRATIO 3.1 08/19/2021    CHOLHDLRATIO 3.0 06/07/2020    CHOLHDLRATIO 2.5 06/21/2019     Lab Results   Component Value Date    TRIG 84 08/19/2021    TRIG 72 06/07/2020    TRIG 67 06/21/2019     No results found for: VITDTOTAL  No results found for: VITAMINB12    Blood Glucose Readings  Patient does not check her blood sugars, she noted that when she used to they were well controlled (under 120 mg/dL).    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 She is very interested in swimming based exercise for both her and her mother. RD encouraged patient to look into swimming lessons, patient stated that she will call the [facility] after our visit today. We also discussed stationary peddlers as well as resistance bands to get more activity in during the day and evenings.   *** where you able to look into swimming aerobics or lessons?  *** try stationary peddlers or resistance bands?  24-Hour recall/usual intake:   Time Intake   Breakfast Sometimes skips  Oatmeal (strawberry and cream)  Rice cakes this AM    ***      Snack (AM) Cashews, grapes, cheese - while at work  [employer]  (12PM) Sometimes skips. Patient noted limited appetite recently.   ***      Snack (PM)    Dinner  (6PM) Yesterday chicken nuggets  Small can of soda  ***      Snack (HS)      Other Nutrition Information:  RD met with Ms. Clendenin in clinic for nutrition visit follow-up. She has lost 9 lbs since our last visit (1 month), RD congratulated patient. She has been focusing on reducing overall intake and reducing her intake of sweets. She noted that she has had decreased appetite with some taste alterations and may only eat 1-2x throughout the day. RD explained how it is very difficult to get in our nutrient needs in just 1-2 meals and encouraged patient to have at least 2 balanced meals and a snack. RD educated patient on the importance of protein as we are losing weight to reduce loss of muscle mass. RD suggested greek yogurt, Kodiak waffles/pancakes/oatmeal, Chobani complete yogurt, popcorn, cheese sticks, unsalted nuts.  *** continued reducing sweets intake?  *** appetite changed?  *** try protein: greek yogurt, kodiak waffle/pancake/oatmeal, chobani, popcorn, cheese stick, nuts?    Portions: small     Dining Out:   1-2x weekly (Chinese food is a favorite). RD suggested adding vegetables to meals when getting take-out to increase fiber content.   ***     Water:   40 ounces daily. Patient noted recent leg cramping she thinks is related to her medications. RD encouraged her to focus on water intake as cramping can be a sign of dehydration. 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 RD brainstormed some possible solutions with patient.   ***     Beverages:  Soda - typically when going out to eat. Encouraged patient to try sparkling water (no added sugar) as an alternative to soda (Sp[employer]rift, Bubbly, [employer]).  Half sweet tea with half lemonade - discussed trying diet lemonade and only getting a small.  Monster zero sugar energy drink daily at work   *** try sparkling water?  *** try diet lemonade? Getting small?    Alcohol:  Not often, but may occasionally have beer or a mimosa. RD encouraged patient to consume in moderation and to always pair with a meal.    Dislikes: cottage cheese, banana (unless on a sandwich), eggs, tuna, watermelon, peanut butter (recent dislike related to taste changes)    Fruits + Vegetables: cherries, grapes, pineapple, apples, green peppers, onion, mushrooms, cucumber, broccoli, collard greens, turnip greens. Patient notes that some vegetables can cause indigestion.     Starch: white pasta, brown rice, popcorn, chips, grits, rice cakes    Protein: chicken, nuts, seafood (not often as family does not like). RD previously suggested protein supplements (Boost or Ensure) to help fill protein gaps in diet    Sweets:   Tends to have a sweet craving (vs savory). She is actively working on reducing the frequency that she has sweets. Discussed more balanced alternatives such as greek yogurt with nut butter and berries frozen as  an evening sweet treat.   *** [employer] Just Fruit, DeeBees    Who does the grocery shopping and prepares the food at home? 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 RD encouraged self care. She enjoys playing games on her phone to help unwind. RD encouraged patient to start to carry snacks that are healthy and high in protein. RD reviewed some ideas with patient.     Sleep:   Poor to okay - patient has sleep apnea. Aims for 8 hours/night, but gets closer to 6-7 hours      HISTORY -   RD and patient discussed the importance of eating what patient is craving but in a portion controlled and more mindful manner. RD noted that patient could try split a dessert with a family member or friend, choose the kids size, find out another way to indulge (healthier snack alternative or another activity to engage in), etc.     RD reminded patient to consider the why behind a snack - hunger, bored, sad, tired, pleasure, etc. RD also reminded patient that anything can fit into a healthy diet - even cookies and cakes - when they are eaten along side other more nutrient dense foods, they are eaten mindfully/ with intent, and a limited (not restricted) fashion.     Patient noted that she would like to learn how to lose weight, and keep her blood sugars well controlled and her heart healthy. Patient noted that her goal weight would be 201 or 200#. RD suggested that patient try not to overly focus on the number on the scale alone as a measure of success. RD noted that how patients clothes feel, her level of energy, and/or measurements are all valid and helpful ways to determine progress. She is proud of the progress that she has made overall. RD celebrated patient successes with patient. Patient has found that the [facility] shot has been a very effective tool for her on this health journey by helping with the feeling of fullness. However patient noted that she soon will not be able to afford this medication (her insurance is not covering it). Patient also noted that even when her insurance was covering it that she dealt with supply chain issues. Patient disappointment but remains determined to continue her weight loss journey.   Patient previously noted that she would not be interested in bariatric surgery at this time, RD expressed understanding.     RD reviewed some basic label reading tips with patient.   Total Carbohydrate: 45 grams per meal, 15 to 20 grams per snack   Fiber:  4 grams or more = fiber rich  Added Sugar: less than 5 grams   Protein: 20 to 30 grams per meal  Saturated Fat: limit   Trans Fat: avoid    Health Maintenance Due   Topic Date Due    Pneumococcal Vaccine 0-64 (2 of 2 - PCV) 11/18/2019    COVID-19 Vaccine (4 - 2023-24 season) 12/13/2021    Hemoglobin A1c  06/30/2022    HPV Cotest with Pap Smear (21-65)  10/20/2022    Pap Smear with Cotest HPV (21-65)  10/22/2022   RD reminded patient that she was due for a HgA1c.     Estimated Daily Needs:  1900 calories (Mifflin-St Jeor equation for BMI > 25 using actual body weight, adjusted for weight loss)  110 g protein (  0.8 g/kg)  45 g carbohydrate grams per meal, 15 to 20 grams carbohydrate per snack     Nutrition Diagnosis:  Overweight/obesity related to previous limited physical activity and previous consumption of unbalanced meals as evidenced by BMI >30.    Nutrition Intervention:  ***   Nutrition Counseling    Materials Provided:  No new materials provided at this time. Supportive listening provided and motivational interviewing conducted.    ***     Social Determinants of Health interventions provided: Unable to address SDOH questions at current visit, however no concerns arose during our visit. Will plan to re-assess at follow-up.  ***     Patient Stated Nutrition Goals:    (Continue) Aim to eat consistent amounts of protein throughout the day, 25-30 grams per meal - discussed unsalted nuts, cheese sticks, Kodiak pancake/waffle/oats, oatmeal, plant-based pasta (Banza, Goodle)  ***     (Continue) Add vegetables to our snacks, lunch, and dinner - Discussed packing raw veggies with unsalted nuts. Preparing a sheet pan of vegetables on Sunday to have the the week with dinner meals.  ***     (Continue) Limit sugar sweetened beverage intake to once weekly - Try diet lemonade with sweet tea, only get a small size. Try sparkling water (no sugar added)   ***     (Continue) Work on engaging in physical activity, aim for 30 minutes daily - Plan to join [employer] to start swim lessons. Discussed at home peddler, resistance bands at bus stops.   ***     -----------------------------  Reminder: Due for Hemoglobin A1C    Goals Added to Visit Navigator?  Yes    Monitoring/Evaluation:   Progress towards goals: ***   continue  on track  not on track    Follow-up:  *** weeks Length of visit was *** minutes  *** unit(s) billed per insurance     [name] MS, RD, LDN

## 2022-10-14 NOTE — Unmapped (Signed)
Called pt as she had not yet presented for scheduled nutrition counseling visit on July 2nd, 2024 at 4pm.  Left voicemail with clinic call back number [phone]) if she would like to reschedule this appointment.    [name]  MS, RD, LDN

## 2022-10-29 NOTE — Unmapped (Signed)
[facility] Refill Coordination Note    Specialty Medication(s) to be Shipped:   Inflammatory Disorders: Cosentyx and Specialty Lite: Mounjaro    Other medication(s) to be shipped: No additional medications requested for fill at this time     Tracy Robles, DOB: Jan 14, 1966  Phone: There are no phone numbers on file.      All above HIPAA information was verified with patient.     Was a Nurse, learning disability used for this call? No    Completed refill call assessment today to schedule patient's medication shipment from the [facility] [phone]).  All relevant notes have been reviewed.     Specialty medication(s) and dose(s) confirmed: Regimen is correct and unchanged.   Changes to medications: Tracy Robles reports no changes at this time.  Changes to insurance: No  New side effects reported not previously addressed with a pharmacist or physician: None reported  Questions for the pharmacist: No    Confirmed patient received a Conservation officer, historic buildings and a Surveyor, mining with first shipment. The patient will receive a drug information handout for each medication shipped and additional FDA Medication Guides as required.       DISEASE/MEDICATION-SPECIFIC INFORMATION        For patients on injectable medications: Patient currently has 1 doses left.  Next injection is scheduled for 7/28.    SPECIALTY MEDICATION ADHERENCE     Medication Adherence    Patient reported X missed doses in the last month: 0  Specialty Medication: COSENTYX PEN (2 PENS) 150 mg/mL  Patient is on additional specialty medications: Yes  Additional Specialty Medications: MOUNJARO 10 mg/0.5 mL   Patient Reported Additional Medication X Missed Doses in the Last Month: 0  Patient is on more than two specialty medications: No              Were doses missed due to medication being on hold? No    Cosentyx 150 mg/ml: 0 days of medicine on hand   Mounjaro 10/0.5 mg/ml: 7 days of medicine on hand        REFERRAL TO PHARMACIST Referral to the pharmacist: Not needed      [facility]     Shipping address confirmed in Epic.       Delivery Scheduled: Yes, Expected medication delivery date: 11/04/22.     Medication will be delivered via Same Day Courier to the prescription address in Epic WAM.    Willette Pa   [facility] Specialty Technician

## 2022-10-31 NOTE — Unmapped (Unsigned)
[facility] Follow Up    Assessment/Plan:     No chief complaint on file.      Problem List Items Addressed This Visit       Class 3 severe obesity with body mass index (BMI) of 50.0 to 59.9 in adult (CMS-HCC)     Komalpreet Blouin is a 57 y.o. female with Class 3 Obesity, [place] Obesity Staging System (EOSS) Stage 2 obesity due to genetic predisposition due to family history, physical inactivity, and frequent consumption of ultraprocessed foods, sleep disruption (waking up at 3 am for work).     Patient is pre contemplative to making lifestyle changes.    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   My recommendations include the following:    Lifestyle Pattern Summary: See GOALS above.      Medication:      CONTINUE: Mounjaro 10 mg daily CONTINUE: Jardiance 10 mg daily    Current:  - Mounjaro  - topiramate  Tried:  - fen-phen  - Wegovy   - Ozempic  - Metformin  Contraindicated:  - phentermine: elevated BP    Stress: Patient has some good coping mechanisms to deal with stressors including doing puzzles, playing games on phone or making jewelry.     Sleep: Patient has OSA. Sleeping 4 hours with CPAP. Wakes up at 3 am. Takes sister to work at 10 pm then goes to sleep.     Obesity Surgery: Patient adamantly declines surgery. Patient is not interested in surgery due to hearing about other people having difficulty post-surgery. Patient also expresses concern about caring for mother and that her sister will not assist. Continues to decline adamantly. (Updated 04/30/22)    MASLD/NAFLD Screening:  Points <1.45: Cirrhosis less likely  Computed FIB-4 Calculation 1.00    Medical conditions:  Glaucoma: no  Seizures: no  Medullary thyroid cancer (personal or family history): no  Multiple Endocrine Neoplasia: no  Palpitations/Tachycardia: no  Chest Pain: no  Headaches/Migraines: no  Nephrolithiasis: no  H/o pancreatitis: no  GERD: no    Prior Surgeries:  Cholecystectomy: no    Birth Control Methods: Post menopause         Primary hypertension     BP is slightly elevated. Follow closely with PCP. We will continue to work on [employer].  Reviewed relevant medications and labs. Patient is following our [facility]. See Obesity tab for medication changes.    BP Readings from Last 3 Encounters:   08/05/22 146/82   06/12/22 129/82   05/21/22 136/76              Sleep apnea     History of OSA, did not tolerate CPAP. Follow up sleep study on 09/11/22 confirmed OSA. Recommended patient to continue to use of CPAP. Reviewed relevant medications and labs. Patient is following our [facility] Clinic. See Obesity tab for medication changes.          Type 2 diabetes mellitus with hyperglycemia, without long-term current use of insulin (CMS-HCC) - Primary     Previous A1C of 5.8%. No acanthosis nigricans on PE. Reviewed relevant medications and labs. Patient is following our [facility] Clinic. See Obesity tab for medication changes.    Lab Results   Component  Value Date    A1C 5.8 12/30/2021    A1C 5.9 08/19/2021    A1C 5.7 05/21/2021                     No follow-ups on file.    I have reviewed and addressed the patient???s adherence and response to prescribed medications. I have identified patient barriers to following the proposed medication and treatment plan, and have noted opportunities to optimize healthy behaviors. I have answered the patient???s questions to satisfaction and the patient voices understanding.    Time: Greater than 50% of this encounter was spent in direct consultation with the patient in evaluation and discussing all of the above. Duration of encounter: 30 minutes.     HPI:     Ismelda Crupi is a 56 y.o. female who  has a past medical history of Allergic (Don't know), Arthritis (2011), BMI 60.0-69.9, adult (CMS-HCC) (05/29/2021), Diabetes mellitus (CMS-HCC), Eczema, High blood pressure, and Psoriasis. who presents today for [facility] follow up.    Weight Management History  Wt Readings from Last 6 Encounters:   09/03/22 (!) 138.8 kg (306 lb)   08/05/22 (!) 142.9 kg (315 lb 0.6 oz)   08/05/22 (!) 142.9 kg (315 lb)   07/10/22 (!) 140.6 kg (310 lb)   06/12/22 (!) 140.6 kg (310 lb)   04/30/22 (!) 137.4 kg (303 lb)         01/08/2022    10:46 AM 04/30/2022     1:54 PM 08/05/2022     2:48 PM   Waist Circumference   Waist Circumference 50 inches 50 inches 49.8 inches       *** INSERT WEIGHT GRAPH    Update:   Medication: Patient is tolerating Mounjaro 10 mg daily and Jardiance 10 mg daily well with no s/e.   Eating Pattern:  - Breakfast: {BREAKFAST GNFA:213086}  - Lunch: {LUNCH VHQI:696295}  - Dinner: {DINNER MWUX:324401}  - Drinks: {DRINK LIST:108030}  - Snacks: {SNACK UUVO:536644}  Physical Activity:  {PHYSICALACTIVITY:108174} for {TIMES:108175} {FREQUENCY:108176}x per week.   Barrier:     Lab Results   Component Value Date    LDL 78 08/19/2021    HDL 46 08/19/2021    A1C 5.8 12/30/2021    GLU 92 04/15/2022    AST 19 08/19/2021    ALT 17 08/19/2021     Past Medical/Surgical History:     Past Medical History:   Diagnosis Date    Allergic Don't know    Penicillin    Arthritis 2011    Lower back    BMI 60.0-69.9, adult (CMS-HCC) 05/29/2021    Diabetes mellitus (CMS-HCC)     Eczema     High blood pressure     Psoriasis      No past surgical history on file.    Social History:     Social History     Socioeconomic History    Marital status: Widowed   Tobacco Use    Smoking status: Never    Smokeless tobacco: Never   Vaping Use    Vaping status: Never Used   Substance and Sexual Activity    Alcohol use: Yes     Alcohol/week: 1.0 standard drink of alcohol     Types: 1 Cans of beer per week     Comment: Only on Saturday    Drug use: Never    Sexual activity: Not Currently     Partners: Male  Birth control/protection: None   Other Topics Concern    Do you use sunscreen? No    Tanning bed use? No    Are you easily burned? No    Excessive sun exposure? No    Blistering sunburns? No     Social Determinants of Health     Food Insecurity: No Food Insecurity (08/01/2021)    Hunger Vital Sign     Worried About Running Out of Food in the Last Year: Never true     Ran Out of Food in the Last Year: Never true       Family History:     Family History   Problem Relation Age of Onset    Diabetes Father     Hypertension Father     Kidney disease Father     Clotting disorder Father         Blood clot    Asthma Father     Diabetes Sister     Miscarriages / Stillbirths Sister     Hypertension Mother     Arthritis Mother     Breast cancer Maternal Aunt     Stroke Maternal Grandmother     Miscarriages / Stillbirths Maternal Grandmother     Melanoma Neg Hx     Basal cell carcinoma Neg Hx     Squamous cell carcinoma Neg Hx     Ovarian cancer Neg Hx        Allergies:     Lisinopril, Latex, and Penicillins    Current Medications:     Current Outpatient Medications   Medication Sig Dispense Refill    amlodipine (NORVASC) 10 MG tablet Take 1 tablet (10 mg total) by mouth daily. 90 tablet 3    biotin 1 mg cap Take by mouth.      blood sugar diagnostic (GLUCOSE BLOOD) Strp Check blood sugar as directed once a day and for symptoms of high or low blood sugar. 100 strip 3    blood-glucose meter kit Use as instructed 1 each 0    cetirizine (ZYRTEC) 5 MG tablet Take 1 tablet (5 mg total) by mouth once as needed.      clobetasoL (TEMOVATE) 0.05 % external solution Apply topically to scalp twice daily. 50 mL 6    clobetasol (TEMOVATE) 0.05 % ointment Apply topically to rash on body twice daily. 60 g 6    empagliflozin (JARDIANCE) 10 mg tablet Take 1 tablet (10 mg total) by mouth daily. 90 tablet 3    gabapentin (NEURONTIN) 300 MG capsule Take 2 capsules (600 mg total) by mouth two (2) times a day. 360 capsule 3 hydroCHLOROthiazide (HYDRODIURIL) 25 MG tablet Take 1 tablet (25 mg total) by mouth daily. 90 tablet 3    loratadine (CLARITIN) 10 mg tablet Take 1 tablet (10 mg total) by mouth daily as needed for allergies.      losartan (COZAAR) 100 MG tablet Take 1 tablet (100 mg total) by mouth daily. 90 tablet 3    multivit with min-folic acid (MULTIVITAMIN GUMMIES) 200 mcg Chew Chew.      ONETOUCH DELICA PLUS LANCET 33 gauge Misc CHECK BLOOD SUGAR AS DIRECTED ONCE A DAY AND FOR SYMPTOMS OF HIGH OR LOW BLOOD SUGAR      secukinumab (COSENTYX PEN, 2 PENS,) 150 mg/mL PnIj injection Inject the contents of 2 pens (300 mg total) under the skin every twenty-eight (28) days. 2 mL 11    simvastatin (ZOCOR) 20 MG tablet Take 1 tablet (20 mg total)  by mouth nightly. 90 tablet 3    spironolactone (ALDACTONE) 25 MG tablet Take 1 tablet (25 mg total) by mouth daily. 90 tablet 3    tirzepatide (MOUNJARO) 10 mg/0.5 mL PnIj Inject the contents of 1 pen (10 mg) under the skin every seven (7) days. 2 mL 3    topiramate (TOPAMAX) 25 MG tablet Take 2 tablets (50 mg total) by mouth nightly. 120 tablet 2    triamcinolone (KENALOG) 0.1 % cream Apply topically to active, raised areas twice daily as needed. 80 g 3     No current facility-administered medications for this visit.       I have reviewed and (if needed) updated the patient's problem list, medications, allergies, past medical and surgical history, social and family history.    ROS:     A 12 point review of systems was negative except for pertinent items noted in the HPI     Vital Signs:     There is no height or weight on file to calculate BMI.      Wt Readings from Last 3 Encounters:   09/03/22 (!) 138.8 kg (306 lb)   08/05/22 (!) 142.9 kg (315 lb 0.6 oz)   08/05/22 (!) 142.9 kg (315 lb)     Temp Readings from Last 3 Encounters:   08/05/22 36.3 ??C (97.3 ??F)   06/12/22 36.5 ??C (97.7 ??F)   04/30/22 35.9 ??C (96.6 ??F)     BP Readings from Last 3 Encounters:   08/05/22 146/82   06/12/22 129/82 05/21/22 136/76     Pulse Readings from Last 3 Encounters:   08/05/22 77   06/12/22 81   04/30/22 81         01/08/2022    10:46 AM 04/30/2022     1:54 PM 08/05/2022     2:48 PM   Waist Circumference   Waist Circumference 50 inches 50 inches 49.8 inches          Physical Exam:     General: well appearing, in NAD, There is no height or weight on file to calculate BMI. Ambulatory without help.  Body fat distribution: General adiposity. No supraclavicular adiposity. No dorsal adiposity.       Head: normocephalic atraumatic.  Eyes: PERRLA, EOMI, Sclera WNL.   Neck: supple, no LAD, no thyromegaly.  CV: RRR no rubs or murmurs.  Lungs: clear bilaterally to auscultation. No wheezing.  Extremities: no clubbing, cyanosis, No edema.  Skin: no concerning lesions, rashes observed. No lipomas, {acanthosis dropdown:108024} acanthosis nigricans.  Neuro: Alert and oriented X 3.    Labs:     No visits with results within 1 Month(s) from this visit.   Latest known visit with results is:   Abstract on 04/29/2022   Component Date Value Ref Range Status    HM Diabetic Eye Exam 04/19/2022 Negative Retinopathy - Both Eyes  Negative Retinopathy - Both Eyes, Negative Retinopathy - Right Eye, Negative Retinopathy - Left Eye Final       Follow-up:     No follow-ups on file.    I attest that I, Donzetta Matters, personally documented this note for [facility], MD.      Donzetta Matters  11/05/2022     I attest that I have reviewed the note and that the components of the history of the present illness, the physical exam, and the assessment and plan documented were performed by me or were performed in my presence where I verified the  documentation and performed (or re-performed) the exam and medical decision making.     Marshell Garfinkel, MD

## 2022-10-31 NOTE — Unmapped (Signed)
Previous A1C of 5.8%. No acanthosis nigricans on PE. Reviewed relevant medications and labs. Patient is following our Weight Management Clinic. See Obesity tab for medication changes.    Lab Results   Component Value Date    A1C 5.8 12/30/2021    A1C 5.9 08/19/2021    A1C 5.7 05/21/2021

## 2022-10-31 NOTE — Unmapped (Signed)
History of OSA, did not tolerate CPAP. Follow up sleep study on 09/11/22 confirmed OSA. Recommended patient to continue to use of CPAP. Reviewed relevant medications and labs. Patient is following our Weight Management Clinic. See Obesity tab for medication changes.

## 2022-10-31 NOTE — Unmapped (Addendum)
Tracy Robles is a 57 y.o. female with Class 3 Obesity, [place] Obesity Staging System (EOSS) Stage 2 obesity due to genetic predisposition due to family history, physical inactivity, and frequent consumption of ultraprocessed foods, sleep disruption (waking up at 3 am for work).     Patient is pre contemplative to making lifestyle changes.    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   My recommendations include the following:    Lifestyle Pattern Summary: See GOALS above.      Medication:      CONTINUE: Mounjaro 10 mg daily CONTINUE: Jardiance 10 mg daily    Current:  - Mounjaro  - topiramate  Tried:  - fen-phen  - Wegovy   - Ozempic  - Metformin  Contraindicated:  - phentermine: elevated BP    Stress: Patient has some good coping mechanisms to deal with stressors including doing puzzles, playing games on phone or making jewelry.     Sleep: Patient has OSA. Sleeping 4 hours with CPAP. Wakes up at 3 am. Takes sister to work at 10 pm then goes to sleep.     Obesity Surgery: Patient adamantly declines surgery. Patient is not interested in surgery due to hearing about other people having difficulty post-surgery. Patient also expresses concern about caring for mother and that her sister will not assist. Continues to decline adamantly. (Updated 04/30/22)    MASLD/NAFLD Screening:  Points <1.45: Cirrhosis less likely  Computed FIB-4 Calculation 1.00    Medical conditions:  Glaucoma: no  Seizures: no  Medullary thyroid cancer (personal or family history): no  Multiple Endocrine Neoplasia: no  Palpitations/Tachycardia: no  Chest Pain: no  Headaches/Migraines: no  Nephrolithiasis: no  H/o pancreatitis: no  GERD: no    Prior Surgeries:  Cholecystectomy: no    Birth Control Methods: Post menopause

## 2022-10-31 NOTE — Unmapped (Signed)
BP is slightly elevated. Follow closely with PCP. We will continue to work on [employer].  Reviewed relevant medications and labs. Patient is following our Weight Management Clinic. See Obesity tab for medication changes.    BP Readings from Last 3 Encounters:   08/05/22 146/82   06/12/22 129/82   05/21/22 136/76

## 2022-11-04 MED FILL — COSENTYX PEN 300 MG/2 PENS (150 MG/ML) SUBCUTANEOUS: SUBCUTANEOUS | 28 days supply | Qty: 2 | Fill #8

## 2022-11-04 MED FILL — MOUNJARO 10 MG/0.5 ML SUBCUTANEOUS PEN INJECTOR: SUBCUTANEOUS | 28 days supply | Qty: 2 | Fill #3

## 2022-11-05 ENCOUNTER — Ambulatory Visit: Admit: 2022-11-05 | Payer: PRIVATE HEALTH INSURANCE | Attending: Family Medicine | Primary: Family Medicine

## 2022-11-26 DIAGNOSIS — E1165 Type 2 diabetes mellitus with hyperglycemia: Principal | ICD-10-CM

## 2022-11-26 MED ORDER — MOUNJARO 10 MG/0.5 ML SUBCUTANEOUS PEN INJECTOR
SUBCUTANEOUS | 3 refills | 28 days
Start: 2022-11-26 — End: ?

## 2022-11-26 NOTE — Unmapped (Signed)
Unable to complete refill request, medication is not included in the refill protocol.  and please see pharmacy comment below.  Please review below request.       Pharmacy comment: Please send new rx to [facility] pharmacy. Medication is scheduled to be filled on 8/21. Thank you       Patient is requesting the following refill  Requested Prescriptions     Pending Prescriptions Disp Refills    tirzepatide (MOUNJARO) 10 mg/0.5 mL PnIj 2 mL 3     Sig: Inject the contents of 1 pen (10 mg) under the skin every seven (7) days.       Recent Visits  Date Type Provider Dept   09/03/22 Office Visit [name], RD/LDN [facility] [address]   08/05/22 Office Visit [name], RD/LDN [facility] [address] [facility]   08/05/22 Office Visit Ro, Honor Junes, MD [facility] [address] [place]   06/12/22 Office Visit Payton Mccallum, MD [facility] [address] [facility]   05/29/22 Telemedicine Ro, Honor Junes, MD [facility] [address] [employer]   04/30/22 Office Visit Ro, Honor Junes, MD [facility] [address]   04/01/22 Office Visit Tester, [place], [place] [facility] [address]   01/08/22 Office Visit Ro, Honor Junes, MD [facility] [address] [place] [address]   12/30/21 Office Visit Tester, [place], [place] [facility] [address] [place]   Showing recent visits within past 365 days and meeting all other requirements  Future Appointments  Date Type Provider Dept   12/05/22 Appointment Payton Mccallum, MD  Family Medicine [address] [place]   Showing future appointments within next 365 days and meeting all other requirements       Labs: Not applicable this refill

## 2022-11-26 NOTE — Unmapped (Signed)
[facility] Refill Coordination Note    Specialty Medication(s) to be Shipped:   Inflammatory Disorders: Cosentyx and Specialty Lite: Mounjaro    Other medication(s) to be shipped:  amlodipine 10, Jardiance 10, losarton 100mg , simvastatin 20mg , spironolactone 25mg , topiramate 25mg      Tracy Robles, DOB: 05/03/1965  Phone: There are no phone numbers on file.      All above HIPAA information was verified with patient.     Was a Nurse, learning disability used for this call? No    Completed refill call assessment today to schedule patient's medication shipment from the [facility] [phone]).  All relevant notes have been reviewed.     Specialty medication(s) and dose(s) confirmed: Regimen is correct and unchanged.   Changes to medications: Tamber reports no changes at this time.  Changes to insurance: No  New side effects reported not previously addressed with a pharmacist or physician: None reported  Questions for the pharmacist: No    Confirmed patient received a Conservation officer, historic buildings and a Surveyor, mining with first shipment. The patient will receive a drug information handout for each medication shipped and additional FDA Medication Guides as required.       DISEASE/MEDICATION-SPECIFIC INFORMATION        For patients on injectable medications: Patient currently has 0 doses left.  Next injection is scheduled for 8/28. Patient states she takes injections on the 28 th of each month to stay adherent.    SPECIALTY MEDICATION ADHERENCE     Medication Adherence    Patient reported X missed doses in the last month: 0  Specialty Medication: secukinumab (COSENTYX PEN, 2 PENS,) 150 mg/mL PnIj injection  Patient is on additional specialty medications: No  Informant: patient              Were doses missed due to medication being on hold? No    Cosentyx 150 mg/ml: 0 days of medicine on hand   Mounjaro 10/0.5 mg/ml: 7 days of medicine on hand        REFERRAL TO PHARMACIST     Referral to the pharmacist: Not needed      [facility]     Shipping address confirmed in Epic.       Delivery Scheduled: Yes, Expected medication delivery date: 12/03/22.     Medication will be delivered via Same Day Courier to the prescription address in Epic WAM.    Tracy Robles   [facility] Specialty Technician

## 2022-11-27 MED ORDER — MOUNJARO 10 MG/0.5 ML SUBCUTANEOUS PEN INJECTOR
SUBCUTANEOUS | 3 refills | 28 days | Status: CP
Start: 2022-11-27 — End: ?
  Filled 2022-12-03: qty 2, 28d supply, fill #0

## 2022-12-02 NOTE — Unmapped (Unsigned)
Assessment and Plan:     There are no diagnoses linked to this encounter.  Multiple diagnoses above reviewed with patient  Continue current medication management  Rx as per orders; reviewed risks/benefits, possible side effects and patient verbalizes understanding  Referrals/labs as per orders above  Recommend supportive treatment with lifestyle modifications with diet/exercise  Follow up in 3-4 months or sooner prn      Barriers to recommended plan: {barrierstocare:74100}    I provided an intervention for the {SDOH ZOXWRU:04540} SDOH domain. The intervention was {SDOH INTERVENTION:69735}     PHQ-2 Score:      PHQ-9 Score:      {select_status_or_delete_smartlist:64641}      I personally spent *** minutes face-to-face and non-face-to-face in the care of this patient, which includes all pre, intra, and post visit time (reviewing, evaluating and discussing pertinent records for patient's care) on the date of service.     No follow-ups on file.    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 Last seen by me 06/12/2022 for BP recheck and establishing care. DM2 - started Jardiance 10 mg daily. Patient with h/o HTN, psoriasis, b/l back pain, DM2, obesity.     HPI   DM2  Patient taking Mounjaro 10 mg weekly and Jardiance 10 mg daily for DM2 management. Also taking topiramate 50 mg for weight loss and simvastatin 20 mg nightly as statin therapy. She is following with Dr. Paulla Dolly weight management clinic, last seen 08/05/2022. Last A1c was 5.8 (12/30/21).    Wt Readings from Last 3 Encounters:   09/03/22 (!) 138.8 kg (306 lb)   08/05/22 (!) 142.9 kg (315 lb 0.6 oz)   08/05/22 (!) 142.9 kg (315 lb)     Hypertension  Patient with history of hypertension manages with amlodipine 10 mg daily, HCTZ 25 mg daily, losartan 100 mg daily, and spironolactone 25 mg daily. Blood pressure at triage today ***. Denies CP, SOB, L/D or peripheral edema.    BP Readings from Last 3 Encounters:   08/05/22 146/82   06/12/22 129/82   05/21/22 136/76             ROS:   Review of Systems     Review of systems negative unless otherwise noted as per HPI.      The following portions of the patient's history were reviewed and updated as appropriate: allergies, current medications, past family history, past medical history, past social history, past surgical history and problem list.     Objective:     There were no vitals filed for this visit.  There is no height or weight on file to calculate BMI.    Physical Exam     Allergies:     Lisinopril, Latex, and Penicillins    [facility]:     Medication adherence and barriers to the treatment plan have been addressed. Opportunities to optimize healthy behaviors have been discussed. Patient / caregiver voiced understanding.      I attest that I, Vickie Melnik F Doreather Hoxworth, personally documented this note while acting as scribe for Payton Mccallum, MD.      Ranee Gosselin, Scribe.  12/05/2022     The documentation recorded by the scribe accurately reflects the service I personally performed and the decisions made by me.    Payton Mccallum, MD

## 2022-12-03 MED FILL — COSENTYX PEN 300 MG/2 PENS (150 MG/ML) SUBCUTANEOUS: SUBCUTANEOUS | 28 days supply | Qty: 2 | Fill #9

## 2022-12-03 MED FILL — SIMVASTATIN 20 MG TABLET: ORAL | 90 days supply | Qty: 90 | Fill #2

## 2022-12-03 MED FILL — TOPIRAMATE 25 MG TABLET: ORAL | 60 days supply | Qty: 120 | Fill #0

## 2022-12-03 MED FILL — SPIRONOLACTONE 25 MG TABLET: ORAL | 90 days supply | Qty: 90 | Fill #2

## 2022-12-03 MED FILL — JARDIANCE 10 MG TABLET: ORAL | 90 days supply | Qty: 90 | Fill #1

## 2022-12-03 MED FILL — AMLODIPINE 10 MG TABLET: ORAL | 90 days supply | Qty: 90 | Fill #2

## 2022-12-03 MED FILL — LOSARTAN 100 MG TABLET: ORAL | 90 days supply | Qty: 90 | Fill #2

## 2022-12-05 ENCOUNTER — Ambulatory Visit: Admit: 2022-12-05 | Payer: PRIVATE HEALTH INSURANCE

## 2022-12-08 ENCOUNTER — Ambulatory Visit: Admit: 2022-12-08 | Discharge: 2022-12-09 | Payer: PRIVATE HEALTH INSURANCE

## 2022-12-08 ENCOUNTER — Ambulatory Visit
Admit: 2022-12-08 | Discharge: 2022-12-09 | Payer: PRIVATE HEALTH INSURANCE | Attending: Student in an Organized Health Care Education/Training Program | Primary: Student in an Organized Health Care Education/Training Program

## 2022-12-08 DIAGNOSIS — G8929 Other chronic pain: Principal | ICD-10-CM

## 2022-12-08 DIAGNOSIS — M5442 Lumbago with sciatica, left side: Principal | ICD-10-CM

## 2022-12-08 DIAGNOSIS — M1990 Unspecified osteoarthritis, unspecified site: Principal | ICD-10-CM

## 2022-12-08 DIAGNOSIS — L409 Psoriasis, unspecified: Principal | ICD-10-CM

## 2022-12-08 NOTE — Unmapped (Signed)
Dear [name]    It was nice to see you!     Today in clinic we discussed the following:     We will get an xray of your sacroiliac joint to evaluate you for inflammation of the joint, which can be seen in psoriatic arthritis    I will refer you to physical therapy and the spine center for degenerative joint disease     If you have non-urgent questions, the best way to contact us is to send a MyChart message by visiting [email or web address].  You can also use MyChart to request refills and access test results.  I will do my best to respond within 2 business days, however occasionally it may take longer. If you have immediate concerns, please contact our clinic by phone [phone].     It was a pleasure taking care of you!     Kathlene Cote, MD   [facility]  [address]  [place], [place] [address]  Phone: [phone]  Fax: [phone]

## 2022-12-08 NOTE — Unmapped (Signed)
Rheumatology Clinic Initial Visit Note     Reason for consult: Tracy Robles is seen in consultation at the request of Dr. Cruzita Lederer for evaluation of back pain.     Assessment and Plan:   Tracy Robles is a 57 y.o.  female with DM2, HTN, psoriasis on cosentyx here for evaluation of low back pain.     Her description of low back pain and review of prior imaging is more suggestive of DJD with sciatica rather than sacroiliitis. Her history and physical exam are also not suggestive of a peripheral inflammatory arthritis/psoriatic arthritis. Will obtain sacroiliac xray for further evaluation. If xray is concerning for sacroiliitis, will arrange for rheumatology follow up. If not, will refer to PT and re-refer to spine center for management of DJD. Biologics for psoriasis per dermatology.     Recommendations:   - Obtain sacroiliac xray  - Re-refer to spine center for DJD  - Refer to PT for low back pain and DJD   - Consider MRI lumbar spine if her symptoms worsen     Patient was seen and discussed with Dr. Tomasa Rand.     Kathlene Cote, MD  Rheumatology Fellow PGY5  Pager [phone]  ------------------------------------------------------------------------------------------------------------------    HPI: Tracy Robles is a 57 y.o.  female with DM2, HTN, psoriasis on cosentyx here for evaluation of low back pain.     Reports she has had psoriasis since 2008. It was particularly bad at her lower back. She was previously treated with topical steroids. She has been on cosentyx in recent years with improvement, but has flares in between the doses. Dermatology has tried switching to taltz but running into issues with insurance.     She reports 10 years of low back pain and sciatica in her right leg. She is starting to have sciatica in her left leg in the last 1-2 years. She reports her low back pain is worse with activity and at the end of the day. She notices a lot of popping in her back. She denies low back stiffness. Prior imaging has demonstrated multilevel degenerative changes with grade II anterolisthesis L4 on L5. She saw spine center 11/2018 and they recommended PT, gabapentin, and weight loss. Not a surgical candidate due to BMI.     Does not have pain in the knees, hands, elbows, right ankle, feet. She twisted her left ankles years ago and occasionally has pain related to this. 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 She has carpal tunnel bilaterally associated with numbness of her first three digits. She has tried sleeping with wrist brace on but does not like wearing it.     She does not have swelling, warmth, or stiffness of her joints. No history of inflammatory eye disease or IBD. Denies recent fever, chills, infection.     Reports she tries to stay active - dances regularly. She has lost 100 lbs intentionally over the last 10 years. She is on mounjaro.     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 Denies drug use.   - Reports 2-4 drinks/week    Family history:   - Psoriasis in several cousins  - No other family history of autoimmune disease     Allergies: Lisinopril, Latex, and Penicillins  Immunizations:   Immunization History   Administered Date(s) Administered    COVID-19 VAC,BIVALENT,MODERNA(BLUE CAP) 05/18/2021  COVID-19 VACCINE,MRNA(MODERNA)(PF) 07/30/2019, 08/27/2019    INFLUENZA INJ MDCK PF, QUAD,(FLUCELVAX)(10MO AND UP EGG FREE) 01/23/2020, 02/12/2021    Influenza Vaccine Quad(IM)6 MO-Adult(PF) 02/10/2019, 12/30/2021    PNEUMOCOCCAL POLYSACCHARIDE 23-VALENT 11/18/2018    PPD Test 06/21/2019    SHINGRIX-ZOSTER VACCINE (HZV),RECOMBINANT,ADJUVANTED(IM) 11/18/2018, 02/10/2019    Td (adult) unspecified formulation 07/14/2017    TdaP 06/07/2020      PMHx:   Past Medical History:   Diagnosis Date    Allergic Don't know    Penicillin    Arthritis 2011    Lower back    BMI 60.0-69.9, adult (CMS-HCC) 05/29/2021 Diabetes mellitus (CMS-HCC)     Eczema     High blood pressure     Psoriasis      PSHx: No past surgical history on file.  Family Hx:   Family History   Problem Relation Age of Onset    Diabetes Father     Hypertension Father     Kidney disease Father     Clotting disorder Father         Blood clot    Asthma Father     Diabetes Sister     Miscarriages / Stillbirths Sister     Hypertension Mother     Arthritis Mother     Breast cancer Maternal Aunt     Stroke Maternal Grandmother     Miscarriages / Stillbirths Maternal Grandmother     Melanoma Neg Hx     Basal cell carcinoma Neg Hx     Squamous cell carcinoma Neg Hx     Ovarian cancer Neg Hx      Social Hx:   Social History     Socioeconomic History    Marital status: Widowed   Tobacco Use    Smoking status: Never    Smokeless tobacco: Never   Vaping Use    Vaping status: Never Used   Substance and Sexual Activity    Alcohol use: Yes     Alcohol/week: 1.0 standard drink of alcohol     Types: 1 Cans of beer per week     Comment: Only on Saturday    Drug use: Never    Sexual activity: Not Currently     Partners: Male     Birth control/protection: None   Other Topics Concern    Do you use sunscreen? No    Tanning bed use? No    Are you easily burned? No    Excessive sun exposure? No    Blistering sunburns? No     Social Determinants of Health     Food Insecurity: No Food Insecurity (08/01/2021)    Hunger Vital Sign     Worried About Running Out of Food in the Last Year: Never true     Ran Out of Food in the Last Year: Never true       Medications:   Current Outpatient Medications   Medication Sig Dispense Refill    amlodipine (NORVASC) 10 MG tablet Take 1 tablet (10 mg total) by mouth daily. 90 tablet 3    biotin 1 mg cap Take by mouth.      blood sugar diagnostic (GLUCOSE BLOOD) Strp Check blood sugar as directed once a day and for symptoms of high or low blood sugar. 100 strip 3    cetirizine (ZYRTEC) 5 MG tablet Take 1 tablet (5 mg total) by mouth once as needed. clobetasoL (TEMOVATE) 0.05 % external solution Apply topically to scalp twice daily. 50 mL 6  clobetasol (TEMOVATE) 0.05 % ointment Apply topically to rash on body twice daily. 60 g 6    empagliflozin (JARDIANCE) 10 mg tablet Take 1 tablet (10 mg total) by mouth daily. 90 tablet 3    loratadine (CLARITIN) 10 mg tablet Take 1 tablet (10 mg total) by mouth daily as needed for allergies.      losartan (COZAAR) 100 MG tablet Take 1 tablet (100 mg total) by mouth daily. 90 tablet 3    multivit with min-folic acid (MULTIVITAMIN GUMMIES) 200 mcg Chew Chew.      ONETOUCH DELICA PLUS LANCET 33 gauge Misc CHECK BLOOD SUGAR AS DIRECTED ONCE A DAY AND FOR SYMPTOMS OF HIGH OR LOW BLOOD SUGAR      secukinumab (COSENTYX PEN, 2 PENS,) 150 mg/mL PnIj injection Inject the contents of 2 pens (300 mg total) under the skin every twenty-eight (28) days. 2 mL 11    simvastatin (ZOCOR) 20 MG tablet Take 1 tablet (20 mg total) by mouth nightly. 90 tablet 3    spironolactone (ALDACTONE) 25 MG tablet Take 1 tablet (25 mg total) by mouth daily. 90 tablet 3    tirzepatide (MOUNJARO) 10 mg/0.5 mL PnIj Inject the contents of 1 pen (10 mg) under the skin every seven (7) days. 2 mL 3    topiramate (TOPAMAX) 25 MG tablet Take 2 tablets (50 mg total) by mouth nightly. 120 tablet 2    triamcinolone (KENALOG) 0.1 % cream Apply topically to active, raised areas twice daily as needed. 80 g 3    blood-glucose meter kit Use as instructed 1 each 0    gabapentin (NEURONTIN) 300 MG capsule Take 2 capsules (600 mg total) by mouth two (2) times a day. 360 capsule 3    hydroCHLOROthiazide (HYDRODIURIL) 25 MG tablet Take 1 tablet (25 mg total) by mouth daily. 90 tablet 3     No current facility-administered medications for this visit.       Physical Exam:    Vitals:    12/08/22 1320   BP: 144/86   BP Site: L Arm   BP Position: Sitting   BP Cuff Size: Medium   Pulse: 68   Temp: 36.2 ??C (97.2 ??F)   TempSrc: Temporal   Weight: (!) 136.6 kg (301 lb 3.2 oz)     GEN: In no acute distress.  Well nourished and well kempt.  Head: No alopecia. [place]/AT.  Eyes: EOMI. No conjunctival injection.  Ears: Normal external exam.   Oral:  Clear oropharynx. No oral ulcers.   Lymph: No lymphadenopathy of cervical areas.  Chest: Good respiratory effort with symmetrical chest expansion.  CTAB.  Cardiovascular: RRR, normal s1/s2, no murmurs appreciated.  2+ distal pulses equal bilaterally.  MSK:  No synovitis of hands, wrists, elbows, shoulders, knees, ankles, feet. Full ROM throughout. Thoracic spine tender to midline palpation. Non-tender to palpation of SI joints. Occiput to wall 0 cm. Schobers 10--> 14 cm. Lateral arm flexion 21 cm left, 20 cm right   Neuro:  AAO x 3.  No gross neurologic deficits.  Psych: Normal mood and affect. Normal thought process.    Labs:    Imaging:

## 2022-12-26 NOTE — Unmapped (Signed)
In follow up to patient's 2024 Health Risk Assessment and self reported hx of pre-diabetes or diabetes a chart review was completed by me.      Last A1C- 5.8 on 12/30/21. Followed by [facility] PCP and dietician (last seen 08/05/22) for DM care.      Pt is aware of how to reach [place] at [facility] if they have any health concerns.

## 2023-01-02 NOTE — Unmapped (Signed)
The [facility] has made a second and final attempt to reach this patient to refill the following medication:Cosentyx.      We have left voicemails on the following phone numbers: [phone] and have sent a text message to the following phone numbers: [phone] .    Dates contacted: 12/25/2022   01/01/2023  Last scheduled delivery: 12/03/2022    The patient may be at risk of non-compliance with this medication. The patient should call the [facility] at [phone]  Option 4, then Option 2: Dermatology, Gastroenterology, Rheumatology to refill medication.    Orlean Patten

## 2023-01-06 NOTE — Unmapped (Signed)
Called, left voicemail    Thanks,  [employer]

## 2023-01-09 NOTE — Unmapped (Signed)
[facility] Refill Coordination Note    Specialty Medication(s) to be Shipped:   Inflammatory Disorders: Cosentyx    Other medication(s) to be shipped: No additional medications requested for fill at this time     Tracy Robles, DOB: [birth year]  Phone: There are no phone numbers on file.      All above HIPAA information was verified with patient.     Was a Nurse, learning disability used for this call? No    Completed refill call assessment today to schedule patient's medication shipment from the [facility] [phone]).  All relevant notes have been reviewed.     Specialty medication(s) and dose(s) confirmed: Regimen is correct and unchanged.   Changes to medications: Weltha reports no changes at this time.  Changes to insurance: No  New side effects reported not previously addressed with a pharmacist or physician: None reported Patient states she has had more flare-ups on her legs recently and thinks injections are not as effective. Patient will follow-up with Provider at next appointment on 10/31.  Questions for the pharmacist: No    Confirmed patient received a Conservation officer, historic buildings and a Surveyor, mining with first shipment. The patient will receive a drug information handout for each medication shipped and additional FDA Medication Guides as required.       DISEASE/MEDICATION-SPECIFIC INFORMATION        For patients on injectable medications: Patient currently has 0 doses left.  Next injection is scheduled for 9/28. Patient states she takes injections on the 28 th of each month to stay adherent.    SPECIALTY MEDICATION ADHERENCE     Medication Adherence    Patient reported X missed doses in the last month: 0  Specialty Medication: secukinumab (COSENTYX PEN, 2 PENS,) 150 mg/mL PnIj injection  Patient is on additional specialty medications: No  Informant: patient              Were doses missed due to medication being on hold? No    Cosentyx 150 mg/ml: 0 days of medicine on hand REFERRAL TO PHARMACIST     Referral to the pharmacist: Not needed      [facility]     Shipping address confirmed in Epic.       Delivery Scheduled: Yes, Expected medication delivery date: 01/12/23.     Medication will be delivered via Same Day Courier to the prescription address in Epic WAM.    [name]   [facility] Specialty Technician

## 2023-01-12 MED FILL — COSENTYX PEN 300 MG/2 PENS (150 MG/ML) SUBCUTANEOUS: SUBCUTANEOUS | 28 days supply | Qty: 2 | Fill #10

## 2023-01-12 NOTE — Unmapped (Signed)
Appointment on 10/01 with Dr. Odis Luster    Thanks  Toni Amend

## 2023-01-12 NOTE — Unmapped (Signed)
Called, left voicemail    Thanks,  [employer]

## 2023-01-13 ENCOUNTER — Other Ambulatory Visit: Admit: 2023-01-13 | Discharge: 2023-01-13 | Payer: PRIVATE HEALTH INSURANCE

## 2023-01-13 ENCOUNTER — Ambulatory Visit
Admit: 2023-01-13 | Discharge: 2023-01-13 | Payer: PRIVATE HEALTH INSURANCE | Attending: Dermatology | Primary: Dermatology

## 2023-01-13 DIAGNOSIS — L409 Psoriasis, unspecified: Principal | ICD-10-CM

## 2023-01-13 DIAGNOSIS — B354 Tinea corporis: Principal | ICD-10-CM

## 2023-01-13 DIAGNOSIS — Z79899 Other long term (current) drug therapy: Principal | ICD-10-CM

## 2023-01-13 MED ORDER — KETOCONAZOLE 2 % TOPICAL CREAM
TOPICAL | 11 refills | 0.00000 days | Status: CP
Start: 2023-01-13 — End: ?

## 2023-01-13 NOTE — Unmapped (Signed)
Dermatology Note     Assessment and Plan:    Psoriasis, chronic, well controlled on Cosentyx, BSA <5%  - Psoriasis previously flaring but now well controlled  - Previously discussed augmenting Cosentyx with UV therapy but unable to make frequent appointments due to job limitations. Methotrexate is not a viable option due to daily alcohol use and baseline creatinine elevations.   Altamease Oiler was previously prescribed by Dr. Marvis Moeller and patient reported that Rx was denied by insurance, so she has been taking Cosentyx 300 mg every month  - Continue Cosentyx 300mg  monthly   - Proper administration, adverse effects and what to expect were reviewed with patient  - Continue triamcinolone (KENALOG) 0.1% cream. Apply topically to active, raised areas twice daily PRN  - Evaluated for joint/back pain by Rheumatology 12/09/2022- no concern for psoriatic arthritis at that time    Rash on the arm, worsened with triamcinolone ointment  - Consistent with tinea incognito today- no scale so unable to peform KOH prep  - Less likely psoriasis   - Start ketoconazole (NIZORAL) 2 % cream; Apply twice daily to itchy red area until the rash is gone  Dispense: 30 g; Refill: 11    High risk medication use (Cosentyx)  - TB test neg (05/2021)-repeat today    The patient was advised to call for an appointment should any new, changing, or symptomatic lesions develop.     RTC: Return in about 6 months (around 07/14/2023). or sooner as needed   _________________________________________________________________      Chief Complaint     Chief Complaint   Patient presents with    Psoriasis     Pt brought in cosentyx med she received yesterday but was suppose to take shot on 28th and want to know if she should still take it . Places on Right and left arm and place on both thighs       HPI     Tracy Robles is a 57 y.o. female who presents as a returning patient (last seen 02/13/2022) to Dermatology for follow up of psoriasis.     - Cosentyx shot arrived a couple days late so she wanted to make sure she can still take it  - Has an itchy spot on her right arm that she used triamcinolone  on- made it much worse   - R big toe has been hurting- has been treated with oral terbinafine in the past. Has an appointment with podiatrist soon  - Psoriasis has been well controlled on the Cosentyx     The patient denies any other new or changing lesions or areas of concern.     Pertinent Past Medical History   No history of skin cancer  Diabetes  Psoriasis with consistent biopsy on 01/12/2018  Onychomycosis with clipping consistent from 11/09/2018     Family History:   Negative for melanoma     Social History:  She is a bus Hospital doctor for town of [place]  Husband passed away suddenly in 12/09/2019    Past Medical History, Family History, Social History, Medication List, Allergies, and Problem List were reviewed in the rooming section of Epic.     ROS: Other than symptoms mentioned in the HPI, no fevers, chills, or other skin complaints    Physical Examination     GENERAL: Well-appearing female in no acute distress, resting comfortably.  NEURO: Alert and oriented, answers questions appropriately  PSYCH: Normal mood and affect  RESP: No increased work of breathing  SKIN (Sun exposed Exam): Per patient request, examination of the face, neck, scalp, bilateral upper extremities, palms, and fingernails was performed as well as distal lower extremities bilaterally   - Red indurated patch with no scale on the R upper arm  - Skin otherwise clear  - R big toe with inflammation at the medial nail   All areas not commented on are within normal limits or unremarkable    (Approved Template 12/26/2019)

## 2023-01-15 LAB — QUANTIFERON TB GOLD PLUS
QUANTIFERON ANTIGEN 1 MINUS NIL: 0.02 [IU]/mL
QUANTIFERON ANTIGEN 2 MINUS NIL: 0.01 [IU]/mL
QUANTIFERON MITOGEN: 9.98 [IU]/mL
QUANTIFERON TB GOLD PLUS: NEGATIVE
QUANTIFERON TB NIL VALUE: 0.02 [IU]/mL

## 2023-01-15 LAB — TB AG2: TB AG2 VALUE: 0.03

## 2023-01-15 LAB — TB NIL: TB NIL VALUE: 0.02

## 2023-01-15 LAB — TB AG1: TB AG1 VALUE: 0.04

## 2023-01-15 LAB — TB MITOGEN: TB MITOGEN VALUE: 10

## 2023-01-19 NOTE — Unmapped (Signed)
Patient notified of labs results via MyChart message

## 2023-01-22 ENCOUNTER — Ambulatory Visit: Admit: 2023-01-22 | Discharge: 2023-01-23 | Payer: PRIVATE HEALTH INSURANCE

## 2023-01-22 ENCOUNTER — Encounter: Payer: Self-pay | Admitting: Podiatry

## 2023-01-22 ENCOUNTER — Ambulatory Visit (INDEPENDENT_AMBULATORY_CARE_PROVIDER_SITE_OTHER): Payer: BC Managed Care – PPO | Admitting: Podiatry

## 2023-01-22 VITALS — BP 141/72 | HR 77

## 2023-01-22 DIAGNOSIS — M4316 Spondylolisthesis, lumbar region: Principal | ICD-10-CM

## 2023-01-22 DIAGNOSIS — Z23 Encounter for immunization: Principal | ICD-10-CM

## 2023-01-22 DIAGNOSIS — G8929 Other chronic pain: Principal | ICD-10-CM

## 2023-01-22 DIAGNOSIS — M545 Chronic bilateral low back pain without sciatica: Principal | ICD-10-CM

## 2023-01-22 DIAGNOSIS — L600 Ingrowing nail: Secondary | ICD-10-CM | POA: Diagnosis not present

## 2023-01-22 MED ORDER — MELOXICAM 15 MG TABLET
ORAL_TABLET | Freq: Every day | ORAL | 1 refills | 30 days | Status: CP | PRN
Start: 2023-01-22 — End: 2023-03-23

## 2023-01-22 MED ORDER — CICLOPIROX 8 % EX SOLN: Freq: Every day | CUTANEOUS | 2 refills | Status: AC

## 2023-01-22 NOTE — Unmapped (Signed)
ORTHOPAEDIC SPINE CLINIC NOTE       Bertram Millard. Tracy Picket, DNP  Nurse Practitioner  [email or web address]  [phone]        Patient Name:Tracy Robles  MRN: [id number]  DOB: Apr 20, 1965    Date: 01/23/2023    PCP: Payton Mccallum, MD    ASSESSMENT:     1. Chronic bilateral low back pain without sciatica    2. Spondylolisthesis at L4-L5 level        Tracy Robles is a 57 y.o. female with chronic R>L axial low back pain.  Known L4-L5 spondylolisthesis.  Physical exam suggestive of primarily right lumbar facet joint pain (+ right facet loading test).    PROM:  None     PLAN and RECOMMENDATIONS:     Options reviewed with patient.  Recommended a trial of spine physical therapy.  Will prescribe meloxicam to take as needed.  McKenzie lumbar roll demonstrated today.    Future Considerations:  -Lumbar MRI    Patient Instructions   Medications: Take over-the-counter medications as needed and as tolerated  If you don't have liver disease, the safest medication for pain is acetaminophen Extended Release (Tylenol Arthritis).  Take 650mg  (1 extended release tab) at a time for pain relief every 8 hours.  Do not take more than 3000mg  per day.  We have prescribed Meloxicam 15mg .  Take as needed as prescribed..  Do not take with other NSAIDs (ie Motrin, Aleve, etc).  You may take with Acetaminophen as listed above.   Activity: Activities as tolerated.  We recommend a McKenzie Lumbar Roll when driving/sitting.  This can be found on [employer] at: [email or web address]  Conservative Care: We referred you to Physical Therapy.  Surgical Care: No surgical indications at this time.  Imaging studies: Lumbar AP, lateral, and Ferguson views (today on the way out).  Labs: None  Return in about 3 months (around 04/24/2023).     Contact our nursing team via MyChart or telephone with any clinical questions/concerns.  Our scheduling team and support staff can be reached at [phone].     Orders Placed This Encounter   Procedures    XR Lumbar Spine AP, Lateral, Ferguson    Ambulatory referral to Physical Therapy     Medications Prescribed Today               meloxicam (MOBIC) 15 MG tablet Take 1 tablet (15 mg total) by mouth daily as needed.             SUBJECTIVE:     Chief Complaint:  Chief Complaint   Patient presents with    Back Pain       History of Present Illness:        01/22/23 1254   PainSc: 6      Tracy Robles is a 57 y.o. female with a relevant PMH of DM, HTN, psoriasis seen in consultation at the request of Arlana Pouch* for evaluation of low back pain. Date of onset: since chronic.  Frequency: Constant and Getting Worse  Modifying factors: worse with sitting  Associated symptoms: stiffness    Long history low back pain.  Right side worse than left.  Shooting pain down left leg over thigh occurring intermittent.  Primary pain right-sided axial low back pain.  Does note intermittent buttock pain.  Pain consistently worse with sitting for extended periods.  Denies any current radicular pain/paresthesia.    Functional limitations include: pain with sitting for prolonged periods  Current Treatments Previous Treatments   Current Relevant Pain Medications:  NSAID: Aleve   AED: topiramate 50mg  nightly    Current Physical Therapy: Current Home Exercise Program and No recent PT    Adjunct Treatments: Activity Modification    In a Pain Clinic: No Prior Relevant Pain Medications:  AED: Gabapentin 600mg  BID    Prior Physical Therapy: Yes.  - PT 03/2021 for low back    Injections:None.    Prior Relevant Surgeries: None.        Medical History   She  has a past medical history of Allergic (Don't know), Arthritis (2011), BMI 60.0-69.9, adult (CMS-HCC) (05/29/2021), Diabetes mellitus (CMS-HCC), Eczema, High blood pressure, and Psoriasis.     Surgical History   She  has no past surgical history on file.     Allergies   Lisinopril, Latex, and Penicillins Medications   She has a current medication list which includes the following prescription(s): amlodipine, biotin, glucose blood, blood-glucose meter, cetirizine, clobetasol, clobetasol, empagliflozin, gabapentin, hydrochlorothiazide, ketoconazole, loratadine, losartan, multivitamin gummies, onetouch delica plus lancet, cosentyx pen (2 pens), simvastatin, spironolactone, mounjaro, topiramate, triamcinolone, and meloxicam.   Review of Systems A 10-system review was performed by questionnaire and noted in the electronic chart.  Positives noted/discussed.  Balance of systems was negative.    Fever/chills: denies  Recent unexplained weight loss: denies  Bowel/bladder symptoms: denies   Family History Her family history includes Arthritis in her mother; Asthma in her father; Breast cancer in her maternal aunt; Clotting disorder in her father; Diabetes in her father and sister; Hypertension in her father and mother; Kidney disease in her father; Miscarriages / Stillbirths in her maternal grandmother and sister; Stroke in her maternal grandmother.     Social History She  reports that she has never smoked. She has never used smokeless tobacco. She reports current alcohol use of about 1.0 standard drink of alcohol per week. She reports that she does not use drugs.        Occupational History    Not on file        OBJECTIVE:     PHYSICAL EXAM:  Vitals: Temp 36.7 ??C (98.1 ??F)  - Wt (!) 134.7 kg (296 lb 14.4 oz)  - BMI 54.29 kg/m??   Appearance: well-nourished and no acute distress   Skin: No cyanosis or clubbing in bilat hands. and No edema in the feet.  Pulses: 2+ DP pulses bilaterally     Neurologic exam:   Mental Status: Tracy Robles is oriented to time, place, and person & is alert and cooperative  Motor: Normal bulk and tone without evidence of pronator drift or fasciculations. No abnormal movements.  Gait: normal.    Cerebellum/Coordination: Deferred    Motor R L  Reflexes R L   IP 5 5  Patellar 1+ 1+   Quad 5 5 Achilles 1+ 1+       Pathologic R L   TA 5 5  Hoffmann's Deferred Deferred   EHL 5 5  Clonus neg. neg.   GS 5 5  Babinski neg. neg.     Sensory: Sensation to light touch intact throughout.    Thoracolumbar Spine Exam:  Inspection: No swelling, erythema, deformity, atrophy or hypertrophy noted  Lumbar Spine ROM: mildly restricted  Spine Palpation: normal skin, diffusely painful to palpation L4-S1 bilateral, R>L    Facet Loading Test: Positive Right  Nerve Tension Signs:  Deferred    Sacroiliac Joint:   Inspection: Normal alignment. No erythema,  discoloration, or asymmetry.  Fortin Finger's Test: Deferred  Palpation: No tenderness at PSIS  Thigh Thrust Test: RLE Deferred, LLE Deferred  FABER: RLE - Negative, LLE - Negative  Distraction: RLE Deferred, LLE Deferred  Active SLR: RLE Deferred, LLE Deferred    Right Hip/Knee:   No pain with hip internal rotation/loading. Normal internal rotation ROM.   No knee pain with flexion/extension. Normal ROM and stability.  Left Hip/Knee:   No pain with hip internal rotation/loading. Normal internal rotation ROM.   No knee pain with flexion/extension. Normal ROM and stability.         MEDICAL DECISION MAKING    Test Results:  Imaging:   L-spine XR. [place]. Date:Today.  Impression: Grade 2 anterolisthesis L4 on L5.  Grade 1 anterolisthesis of L3 on L4. Mild left lateral listhesis of L3 on L4.    SI Joint XR.  [place]. Date:11/2022.  Impression: Mild bilateral SI joint OA.    L-spine XR. . Date:11/2018.  Impression: Grade 2 anterolisthesis L4-L5      I, Duke Salvia, NP, personally interpreted the images. Images were reviewed with the patient on the PACS monitor. The available reports were reviewed. Results were emailed to patient via MyChart.    Labs:  Lab Results   Component Value Date    A1C 5.8 12/30/2021       Discussion:  Clinical findings, diagnostic/treatment options, and plan were discussed with the patient.  Activities - Advised activities as tolerated, using pain as a guide.  Conservative Care - Options discussed.  Medications - risks/benefits of meloxicam were discussed.      E&M Coding:    MEDICAL DECISION MAKING (level of service defined by 2/3 elements)     Number/Complexity of Problems Addressed 1 or more chronic illnesses with exacerbation, progression, or side effects of treatment (99204/99214)   Amount/Complexity of Data to be Reviewed/Analyzed Independent interpretation of a test performed by another physician/other qualified health care professional (99204/99214)   Risk of Complications/Morbidity/Mortality of Management Prescription Medication (99204/99214)     Or TIME     Total Time for E/M Services on the Date of Encounter N/A          cc: Barnett Hatter, MD

## 2023-01-22 NOTE — Unmapped (Addendum)
Medications: Take over-the-counter medications as needed and as tolerated  If you don't have liver disease, the safest medication for pain is acetaminophen Extended Release (Tylenol Arthritis).  Take 650mg  (1 extended release tab) at a time for pain relief every 8 hours.  Do not take more than 3000mg  per day.  We have prescribed Meloxicam 15mg .  Take as needed as prescribed..  Do not take with other NSAIDs (ie Motrin, Aleve, etc).  You may take with Acetaminophen as listed above.   Activity: Activities as tolerated.  We recommend a McKenzie Lumbar Roll when driving/sitting.  This can be found on [employer] at: [email or web address]  Conservative Care: We referred you to Physical Therapy.  Surgical Care: No surgical indications at this time.  Imaging studies: Lumbar AP, lateral, and Ferguson views (today on the way out).  Labs: None  Return in about 3 months (around 04/24/2023).     Contact our nursing team via MyChart or telephone with any clinical questions/concerns.  Our scheduling team and support staff can be reached at [phone].

## 2023-01-22 NOTE — Unmapped (Signed)
Call from patient on nurse line.  LVM stating medication can not be filled by pharmacy. Asked they be called  by office.    [facility] pharmacy I spoke with  pharmacist and she states [employer] requires specific grams and location of body before they will let them fill this medication.  (Ketoconazole cream)  Per pharmacist, since to be applied to a rash on arm , will use 1g for day's supply.      Will notify patient when medication is ready for pick up.    Sent patient a Clinical cytogeneticist message as well.

## 2023-01-22 NOTE — Progress Notes (Signed)
Subjective:  Patient ID: Julia Snow, female    DOB: Apr 22, 1965,  MRN: [id number]  Chief Complaint  Patient presents with   Nail Problem    "The left one has started hurting me again."    57 y.o. female presents with the above complaint.  Patient presents with left medial ingrown nail border.  She states it started to hurt again.  She was doing good for many years denies any other acute complaints   Review of Systems: Negative except as noted in the HPI. Denies N/V/F/Ch.  Past Medical History:  Diagnosis Date   Allergy    Hypertension    Miscarriage 2005   Sleep apnea    does not wear CPAP    Current Outpatient Medications:    amLODipine (NORVASC) 10 MG tablet, Take 1 tablet (10 mg total) by mouth daily., Disp: 90 tablet, Rfl: 1   Biotin 42595 MCG TABS, Take by mouth., Disp: , Rfl:    cetirizine (ZYRTEC) 5 MG tablet, Take by mouth., Disp: , Rfl:    clobetasol (TEMOVATE) 0.05 % external solution, Apply topically to scalp twice daily., Disp: , Rfl:    ferrous sulfate 325 (65 FE) MG EC tablet, Take 325 mg by mouth 3 (three) times daily with meals., Disp: , Rfl:    fexofenadine (ALLEGRA) 60 MG tablet, Take by mouth., Disp: , Rfl:    gabapentin (NEURONTIN) 300 MG capsule, Take 2 capsules (600 mg total) by mouth daily as needed (shingles pain)., Disp: 30 capsule, Rfl: 1   hydrochlorothiazide (HYDRODIURIL) 25 MG tablet, Take 1 tablet (25 mg total) by mouth daily., Disp: 90 tablet, Rfl: 1   losartan (COZAAR) 100 MG tablet, Take 1 tablet (100 mg total) by mouth daily., Disp: 90 tablet, Rfl: 1   losartan-hydrochlorothiazide (HYZAAR) 100-25 MG tablet, Take 1 tablet by mouth daily., Disp: , Rfl:    metFORMIN (GLUCOPHAGE-XR) 500 MG 24 hr tablet, Take 500 mg by mouth 3 (three) times daily., Disp: , Rfl:    Multiple Vitamin (MULTIVITAMIN) tablet, Take 1 tablet by mouth daily., Disp: , Rfl:    mupirocin ointment (BACTROBAN) 2 %, APPLY OINTMENT TOPICALLY TO AFFECTED AREA TWICE DAILY FOR 7 DAYS,  Disp: , Rfl:    Secukinumab 150 MG/ML SOAJ, INJECT THE CONTENTS OF 2 PENS (300 MG) ONCE WEEKLY AT WEEKS 0, 1, 2, 3, AND 4. THEN INJECT 2 PENS (300 MG) EVERY 4 WEEKS, Disp: , Rfl:    Semaglutide,0.25 or 0.5MG /DOS, (OZEMPIC, 0.25 OR 0.5 MG/DOSE,) 2 MG/1.5ML SOPN, , Disp: , Rfl:    Semaglutide-Weight Management (WEGOVY) 1 MG/0.5ML SOAJ, Inject into the skin., Disp: , Rfl:    simvastatin (ZOCOR) 20 MG tablet, Take 1 tablet (20 mg total) by mouth at bedtime., Disp: 90 tablet, Rfl: 1   triamcinolone cream (KENALOG) 0.1 %, Apply topically 2 (two) times daily as needed., Disp: , Rfl:    triamcinolone cream (KENALOG) 0.5 %, Apply 1 application topically 3 (three) times daily., Disp: 454 g, Rfl: 5   vitamin E 1000 UNIT capsule, Take 1,000 Units by mouth daily., Disp: , Rfl:    ciclopirox (PENLAC) 8 % solution, Apply topically at bedtime. Apply over nail and surrounding skin. Apply daily over previous coat. After seven (7) days, may remove with alcohol and continue cycle., Disp: 6.6 mL, Rfl: 2  Social History   Tobacco Use  Smoking Status Never  Smokeless Tobacco Never    Allergies  Allergen Reactions   Lisinopril Cough   Penicillins    Latex  Rash    Other reaction(s): UNKNOWN Other reaction(s): UNKNOWN Other reaction(s): UNKNOWN    Objective:   Vitals:   01/22/23 1553  BP: (!) 141/72  Pulse: 77   There is no height or weight on file to calculate BMI. Constitutional Well developed. Well nourished.  Vascular Dorsalis pedis pulses palpable bilaterally. Posterior tibial pulses palpable bilaterally. Capillary refill normal to all digits.  No cyanosis or clubbing noted. Pedal hair growth normal.  Neurologic Normal speech. Oriented to person, place, and time. Epicritic sensation to light touch grossly present bilaterally.  Dermatologic Painful ingrowing nail at medial nail borders of the hallux nail left. No other open wounds. No skin lesions.  Orthopedic: Normal joint ROM without pain  or crepitus bilaterally. No visible deformities. No bony tenderness.   Radiographs: None Assessment:   1. Ingrown left big toenail     Plan:  Patient was evaluated and treated and all questions answered.  Ingrown Nail, left -I discussed with the patient on removing the ingrown nail.  Versus doing a slant back procedure at this time is given that this is causing her some discomfort I believe patient will benefit from slant back procedure she does not want undergo ingrown nail removal if she can avoid it.  If it starts bothering her again we will plan on doing official ingrown nail removal.  She states understanding.  No follow-ups on file.

## 2023-02-03 NOTE — Unmapped (Signed)
[facility] Refill Coordination Note    Specialty Medication(s) to be Shipped:   Inflammatory Disorders: Cosentyx and Specialty Lite: Mounjaro    Other medication(s) to be shipped: No additional medications requested for fill at this time     Tracy Robles, DOB: 12/03/1965  Phone: There are no phone numbers on file.      All above HIPAA information was verified with patient.     Was a Nurse, learning disability used for this call? No    Completed refill call assessment today to schedule patient's medication shipment from the [facility]  [phone]).  All relevant notes have been reviewed.     Specialty medication(s) and dose(s) confirmed: Regimen is correct and unchanged.   Changes to medications: Zykeriah reports no changes at this time.  Changes to insurance: No  New side effects reported not previously addressed with a pharmacist or physician: None reported  Questions for the pharmacist: No    Confirmed patient received a Conservation officer, historic buildings and a Surveyor, mining with first shipment. The patient will receive a drug information handout for each medication shipped and additional FDA Medication Guides as required.       DISEASE/MEDICATION-SPECIFIC INFORMATION        For patients on injectable medications: Patient currently has 0 Cosentyx and 1 Mounjaro doses left.  Next injection is scheduled for 10/28 (Cosentyx) and 10/22 [facility]).    SPECIALTY MEDICATION ADHERENCE     Medication Adherence    Patient reported X missed doses in the last month: 0  Specialty Medication: secukinumab (COSENTYX PEN, 2 PENS,) 150 mg/mL PnIj injection  Patient is on additional specialty medications: Yes  Additional Specialty Medications: MOUNJARO 10 mg/0.5 mL Pnij (tirzepatide)  Patient Reported Additional Medication X Missed Doses in the Last Month: 0              Were doses missed due to medication being on hold? No    Cosentyx 150 mg/ml: 0 doses of medicine on hand   Mounjaro 10 mg/0.22ml : 0 doses of medicine on hand       REFERRAL TO PHARMACIST     Referral to the pharmacist: Not needed      SHIPPING     Shipping address confirmed in Epic.       Delivery Scheduled: Yes, Expected medication delivery date: 10/25.     Medication will be delivered via Same Day Courier to the prescription address in Epic WAM.    Clydell Hakim, PharmD   [facility]  Specialty Pharmacist

## 2023-02-06 ENCOUNTER — Ambulatory Visit: Admit: 2023-02-06 | Discharge: 2023-02-07 | Payer: PRIVATE HEALTH INSURANCE

## 2023-02-06 DIAGNOSIS — E66813 Class 3 severe obesity with body mass index (BMI) of 50.0 to 59.9 in adult, unspecified obesity type, unspecified whether serious comorbidity present (CMS-HCC): Principal | ICD-10-CM

## 2023-02-06 DIAGNOSIS — Z6841 Body Mass Index (BMI) 40.0 and over, adult: Principal | ICD-10-CM

## 2023-02-06 DIAGNOSIS — E1165 Type 2 diabetes mellitus with hyperglycemia: Principal | ICD-10-CM

## 2023-02-06 DIAGNOSIS — I10 Essential (primary) hypertension: Principal | ICD-10-CM

## 2023-02-06 MED FILL — COSENTYX PEN 300 MG/2 PENS (150 MG/ML) SUBCUTANEOUS: SUBCUTANEOUS | 28 days supply | Qty: 2 | Fill #11

## 2023-02-06 MED FILL — MOUNJARO 10 MG/0.5 ML SUBCUTANEOUS PEN INJECTOR: SUBCUTANEOUS | 28 days supply | Qty: 2 | Fill #1

## 2023-02-06 NOTE — Unmapped (Signed)
Assessment: Hypertension, Type 2 Diabetes, Obesity    Plan: Continue work with dietitian and primary provider. Return to [facility] for weigh ins. At next visit will explore self-care resources to manage her life stressors.     Subjective: Tracy Robles feels good about the work she is doing with her primary provider and dietitian. 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 Admits to lots of stress and little time to take care of herself. She knows this is interfering with her ability to lose weight.     Objective: BP 158/86 and weight 294lb

## 2023-02-24 ENCOUNTER — Other Ambulatory Visit: Admit: 2023-02-24 | Discharge: 2023-02-25 | Payer: BLUE CROSS/BLUE SHIELD

## 2023-02-24 DIAGNOSIS — E1165 Type 2 diabetes mellitus with hyperglycemia: Principal | ICD-10-CM

## 2023-02-24 NOTE — Unmapped (Signed)
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

## 2023-02-25 ENCOUNTER — Ambulatory Visit: Admit: 2023-02-25 | Discharge: 2023-02-26 | Payer: BLUE CROSS/BLUE SHIELD

## 2023-02-25 DIAGNOSIS — M25531 Pain in right wrist: Principal | ICD-10-CM

## 2023-02-25 DIAGNOSIS — M778 Other enthesopathies, not elsewhere classified: Principal | ICD-10-CM

## 2023-02-25 DIAGNOSIS — L409 Psoriasis, unspecified: Principal | ICD-10-CM

## 2023-02-25 MED ORDER — COSENTYX PEN 300 MG/2 PENS (150 MG/ML) SUBCUTANEOUS
SUBCUTANEOUS | 11 refills | 28.00000 days | Status: CP
Start: 2023-02-25 — End: ?
  Filled 2023-03-10: qty 2, 28d supply, fill #0

## 2023-02-25 NOTE — Unmapped (Signed)
Take mobic once daily as needed for pain. Ice. Rest.  Wear brace for one week and reevaluate. Follow up with orthopedic for continued pain.     Follow up with your primary care physician this week as needed. Return to Urgent care as needed. Proceed directly to emergency room, for worsening complaints.

## 2023-02-25 NOTE — Unmapped (Signed)
Name:  Tracy Robles  DOB: 22-Dec-1965  Date: 02/25/2023    ASSESSMENT/PLAN:  Hero was seen today for wrist pain.    Diagnoses and all orders for this visit:    Right wrist pain  -     XR Wrist 3 Or More Views Right (Clinic Performed); Future    Right wrist tendinitis      Tracy Robles is a 57 y.o. female is a pleasant well-appearing patient.  Presenting for evaluation of radial wrist pain at the base of thumb medial aspect no trauma.  Repetitive movements throughout the week.  Exam consistent with tendinitis.  X-ray evaluated, no fracture.  Counseled regarding supportive care.  Discussed meloxicam use, patient states she just received this prescription for back pain and has not in room.  Discussed taking this daily for 1 week and reevaluate as well as with thumb spica brace.  Follow-up with orthopedic for continued pain.Discussed indication, risks and benefits of medications with patient.     Discussed follow up with Primary care physician this week as needed. Follow-up as Needed   Discussed follow up and return parameters including no resolution or any worsening concerns. Patient verbalized understanding and agreed to plan.     Pertinent labs & imaging results that were available during my care of the patient were reviewed by me and considered in my medical decision making (see chart for details).     ------------------------------------------------------------------------------    Chief Complaint   Patient presents with    Wrist Pain     Woke one morning with right wrist pain, two weeks ago.  +pain, swelling and popping with movement.        HPI: Tracy Robles is a 57 y.o. female past medical history arthritis, diabetes, psoriasis, hypertension, presenting for evaluation of right wrist pain present for the last 2 weeks.  States pain has been intermittent, more with certain movements of thumb.  Pain is medial wrist along the thumb side.  Denies any fall, direct injury or direct trauma.  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  No sudden change.  Reports she does have carpal tunnel history to her right wrist and a previous thumb sprain injury.  No new injury.  Denies atypical paresthesias to the right hand, states she sometimes gets it with the carpal tunnel at baseline.  No current paresthesias.  States still has good strength for the most part.  Denies further pain radiation.    ROS:  Constitutional: No fever/chills  Eyes: No visual changes.  ENT: No sore throat.  Cardiovascular: Denies chest pain.  Respiratory: Denies shortness of breath.  Gastrointestinal: No abdominal pain.  No nausea, no vomiting.  No diarrhea.  No constipation.  Genitourinary: Negative for dysuria.  Musculoskeletal: As above  Skin: Negative for rash.  Neurological: Negative for headaches, focal weakness or numbness.    I have reviewed past medical, surgical, medications, allergies, social and family histories today and updated them in Epic where appropriate.    PMH:  Past Medical History:   Diagnosis Date    Allergic Don't know    Penicillin    Arthritis 2011    Lower back    BMI 60.0-69.9, adult (CMS-HCC) 05/29/2021    Diabetes mellitus (CMS-HCC)     Eczema     High blood pressure     Psoriasis        SURGICAL HX:  No past surgical history on file.    MEDS:  Current Outpatient Medications:     amlodipine (NORVASC) 10 MG tablet, Take 1 tablet (10 mg total) by mouth daily., Disp: 90 tablet, Rfl: 3    cetirizine (ZYRTEC) 5 MG tablet, Take 1 tablet (5 mg total) by mouth once as needed., Disp: , Rfl:     empagliflozin (JARDIANCE) 10 mg tablet, Take 1 tablet (10 mg total) by mouth daily., Disp: 90 tablet, Rfl: 3    losartan (COZAAR) 100 MG tablet, Take 1 tablet (100 mg total) by mouth daily., Disp: 90 tablet, Rfl: 3    multivit with min-folic acid (MULTIVITAMIN GUMMIES) 200 mcg Chew, Chew., Disp: , Rfl:     secukinumab (COSENTYX PEN, 2 PENS,) 150 mg/mL PnIj injection, Inject the contents of 2 pens (300 mg total) under the skin every twenty-eight (28) days., Disp: 2 mL, Rfl: 11    simvastatin (ZOCOR) 20 MG tablet, Take 1 tablet (20 mg total) by mouth nightly., Disp: 90 tablet, Rfl: 3    spironolactone (ALDACTONE) 25 MG tablet, Take 1 tablet (25 mg total) by mouth daily., Disp: 90 tablet, Rfl: 3    tirzepatide (MOUNJARO) 10 mg/0.5 mL PnIj, Inject the contents of 1 pen (10 mg) under the skin every seven (7) days., Disp: 2 mL, Rfl: 3    topiramate (TOPAMAX) 25 MG tablet, Take 2 tablets (50 mg total) by mouth nightly., Disp: 120 tablet, Rfl: 2    biotin 1 mg cap, Take by mouth., Disp: , Rfl:     blood sugar diagnostic (GLUCOSE BLOOD) Strp, Check blood sugar as directed once a day and for symptoms of high or low blood sugar., Disp: 100 strip, Rfl: 3    blood-glucose meter kit, Use as instructed, Disp: 1 each, Rfl: 0    clobetasoL (TEMOVATE) 0.05 % external solution, Apply topically to scalp twice daily., Disp: 50 mL, Rfl: 6    clobetasol (TEMOVATE) 0.05 % ointment, Apply topically to rash on body twice daily., Disp: 60 g, Rfl: 6    gabapentin (NEURONTIN) 300 MG capsule, Take 2 capsules (600 mg total) by mouth two (2) times a day. (Patient not taking: Reported on 02/25/2023), Disp: 360 capsule, Rfl: 3    hydroCHLOROthiazide (HYDRODIURIL) 25 MG tablet, Take 1 tablet (25 mg total) by mouth daily., Disp: 90 tablet, Rfl: 3    ketoconazole (NIZORAL) 2 % cream, Apply twice daily to itchy red area until the rash is gone, Disp: 30 g, Rfl: 11    loratadine (CLARITIN) 10 mg tablet, Take 1 tablet (10 mg total) by mouth daily as needed for allergies., Disp: , Rfl:     meloxicam (MOBIC) 15 MG tablet, Take 1 tablet (15 mg total) by mouth daily as needed., Disp: 30 tablet, Rfl: 1    ONETOUCH DELICA PLUS LANCET 33 gauge Misc, CHECK BLOOD SUGAR AS DIRECTED ONCE A DAY AND FOR SYMPTOMS OF HIGH OR LOW BLOOD SUGAR, Disp: , Rfl:     triamcinolone (KENALOG) 0.1 % cream, Apply topically to active, raised areas twice daily as needed., Disp: 80 g, Rfl: 3    ALL:  Allergies   Allergen Reactions    Lisinopril Cough    Latex      Other reaction(s): UNKNOWN    Penicillins      Other reaction(s): UNSPECIFIED       SH:  Social History     Tobacco Use    Smoking status: Never     Passive exposure: Current    Smokeless tobacco: Never   Vaping Use  Vaping status: Never Used   Substance Use Topics    Alcohol use: Yes     Alcohol/week: 1.0 standard drink of alcohol     Types: 1 Cans of beer per week     Comment: Only on Saturday    Drug use: Never       FH:  Family History   Problem Relation Age of Onset    Diabetes Father     Hypertension Father     Kidney disease Father     Clotting disorder Father         Blood clot    Asthma Father     Diabetes Sister     Miscarriages / Stillbirths Sister     Hypertension Mother     Arthritis Mother     Breast cancer Maternal Aunt     Stroke Maternal Grandmother     Miscarriages / Stillbirths Maternal Grandmother     Melanoma Neg Hx     Basal cell carcinoma Neg Hx     Squamous cell carcinoma Neg Hx     Ovarian cancer Neg Hx        VITALS:  Vitals:    02/25/23 1051   BP: 150/88   Pulse: 76   Resp: 14   Temp: 36.7 ??C (98 ??F)   SpO2: 96%     Body mass index is 53.77 kg/m??.    Physical Exam  Constitutional: Alert and oriented. Well appearing and in no acute distress.  Eyes: Conjunctivae are normal.   ENT       Head: Normocephalic and atraumatic.  Respiratory: Normal respiratory effort without tachypnea nor retractions.   Musculoskeletal:  Ambulatory with steady gait.  Bilateral handgrip strong.  Bilateral distal radial pulses equal.  Right hand able to make full fist and extend all fingers with normal distal resisted flexion extension of all fingers.  Pain with right thumb extension.  No snuffbox tenderness.  Right wrist tenderness radial pulse at base of thumb minimalize localized swelling, no erythema, no break in skin.  Right upper extremity otherwise nontender.  Neurologic:  Normal speech and language.   Skin:  Skin is warm, dry and intact. No rash noted.  Psychiatric: Mood and affect are normal. Speech and behavior are normal. Patient exhibits appropriate insight and judgment    TEST  RESULTS:    No results found for this visit on 02/25/23.    SCREENINGS:  SDOH:   Social History     Tobacco Use    Smoking status: Never     Passive exposure: Current    Smokeless tobacco: Never   Vaping Use    Vaping status: Never Used   Substance Use Topics    Alcohol use: Yes     Alcohol/week: 1.0 standard drink of alcohol     Types: 1 Cans of beer per week     Comment: Only on Saturday    Drug use: Never     I provided an intervention for the Tobacco Use SDOH domain. The intervention was Education       Radiology  X-ray independently reviewed, no acute fracture or dislocation noted.  Reading Physician Reading Date Result Priority   Lindie Spruce, MD  [phone] 02/25/2023      Narrative & Impression  EXAM: XR WRIST 3 OR MORE VIEWS RIGHT  DATE: 02/25/2023 11:19 AM  ACCESSION: [id number]  DICTATED: 02/25/2023 11:19 AM  INTERPRETATION LOCATION: [facility]     CLINICAL INDICATION: 57 years old Female  with right wrist pain x 2 weeks, NO INJURY  - M25.531 - Right wrist pain        COMPARISON: None.     TECHNIQUE: PA, lateral, and oblique views of the right wrist.     FINDINGS:  No acute fracture. Joint alignment is normal. Thumb MCP narrowing with degenerative sclerosis. No erosions or focal soft tissue swelling.     IMPRESSION:  Thumb MCP osteoarthrosis. No acute osseous abnormality.            Exam Ended: 02/25/23 11:19 Last Resulted: 02/25/23 11:20            Renford Dills, NP-C  [facility]  ----------------------------------------------------------------  Note - This record has been created using [employer]. Chart creation errors have been sought, but may not always have been located. Such creation errors do not reflect on the standard of medical care.

## 2023-03-07 DIAGNOSIS — I10 Essential (primary) hypertension: Principal | ICD-10-CM

## 2023-03-07 MED ORDER — HYDROCHLOROTHIAZIDE 25 MG TABLET
ORAL_TABLET | Freq: Every day | ORAL | 3 refills | 90 days
Start: 2023-03-07 — End: 2024-03-06

## 2023-03-09 MED ORDER — HYDROCHLOROTHIAZIDE 25 MG TABLET
ORAL_TABLET | Freq: Every day | ORAL | 1 refills | 90 days | Status: CP
Start: 2023-03-09 — End: 2024-03-08
  Filled 2023-03-10: qty 90, 90d supply, fill #0

## 2023-03-09 NOTE — Unmapped (Signed)
[facility] Refill Coordination Note    Specialty Medication(s) to be Shipped:   Inflammatory Disorders: Cosentyx and Specialty Lite: Mounjaro    Other medication(s) to be shipped:  amlodipine, hydrochlorothiazide , jardiance, simvastatin , spironolactone and topriamate      [employer], DOB: December 22, 1965  Phone: There are no phone numbers on file.      All above HIPAA information was verified with patient.     Was a Nurse, learning disability used for this call? No    Completed refill call assessment today to schedule patient's medication shipment from the [facility]  [phone]).  All relevant notes have been reviewed.     Specialty medication(s) and dose(s) confirmed: Regimen is correct and unchanged.   Changes to medications: Radiya reports no changes at this time.  Changes to insurance: No  New side effects reported not previously addressed with a pharmacist or physician: None reported  Questions for the pharmacist: No    Confirmed patient received a Conservation officer, historic buildings and a Surveyor, mining with first shipment. The patient will receive a drug information handout for each medication shipped and additional FDA Medication Guides as required.       DISEASE/MEDICATION-SPECIFIC INFORMATION        For patients on injectable medications: Patient currently has 0 doses left.  Next injection is scheduled for 03/11/23.    SPECIALTY MEDICATION ADHERENCE     Medication Adherence    Patient reported X missed doses in the last month: 0  Specialty Medication: secukinumab: COSENTYX PEN (2 PENS) 150 mg/mL Pnij injection  Patient is on additional specialty medications: Yes  Additional Specialty Medications: tirzepatide: MOUNJARO 10 mg/0.5 mL Pnij  Patient Reported Additional Medication X Missed Doses in the Last Month: 0  Patient is on more than two specialty medications: No              Were doses missed due to medication being on hold? No    Cosentyx 150 mg/ml: 0 days of medicine on hand       REFERRAL TO PHARMACIST     Referral to the pharmacist: Not needed      [facility]     Shipping address confirmed in Epic.       Delivery Scheduled: Yes, Expected medication delivery date: 03/10/23.     Medication will be delivered via Same Day Courier to the prescription address in Epic WAM.    Quintella Reichert   [facility]  Specialty Technician
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
## 2023-03-10 MED FILL — SIMVASTATIN 20 MG TABLET: ORAL | 90 days supply | Qty: 90 | Fill #3

## 2023-03-10 MED FILL — LOSARTAN 100 MG TABLET: ORAL | 90 days supply | Qty: 90 | Fill #3

## 2023-03-10 MED FILL — MOUNJARO 10 MG/0.5 ML SUBCUTANEOUS PEN INJECTOR: SUBCUTANEOUS | 28 days supply | Qty: 2 | Fill #2

## 2023-03-10 MED FILL — SPIRONOLACTONE 25 MG TABLET: ORAL | 90 days supply | Qty: 90 | Fill #3

## 2023-03-10 MED FILL — TOPIRAMATE 25 MG TABLET: ORAL | 60 days supply | Qty: 120 | Fill #1

## 2023-03-10 MED FILL — JARDIANCE 10 MG TABLET: ORAL | 90 days supply | Qty: 90 | Fill #2

## 2023-03-10 MED FILL — AMLODIPINE 10 MG TABLET: ORAL | 90 days supply | Qty: 90 | Fill #3

## 2023-03-21 DIAGNOSIS — M545 Chronic bilateral low back pain without sciatica: Principal | ICD-10-CM

## 2023-03-21 DIAGNOSIS — G8929 Other chronic pain: Principal | ICD-10-CM

## 2023-03-21 DIAGNOSIS — M4316 Spondylolisthesis, lumbar region: Principal | ICD-10-CM

## 2023-03-21 MED ORDER — MELOXICAM 15 MG TABLET
ORAL_TABLET | Freq: Every day | ORAL | 0 refills | 0 days
Start: 2023-03-21 — End: ?

## 2023-03-23 MED ORDER — MELOXICAM 15 MG TABLET
ORAL_TABLET | Freq: Every day | ORAL | 0 refills | 30.00 days | Status: CP
Start: 2023-03-23 — End: ?

## 2023-03-24 NOTE — Unmapped (Signed)
Requested Prescriptions     Pending Prescriptions Disp Refills    meloxicam (MOBIC) 15 MG tablet [Pharmacy Med Name: Meloxicam 15 MG Oral Tablet] 30 tablet 0     Sig: TAKE 1 TABLET BY MOUTH ONCE DAILY AS NEEDED     Refill provided.

## 2023-03-26 ENCOUNTER — Ambulatory Visit
Admit: 2023-03-26 | Payer: BLUE CROSS/BLUE SHIELD | Attending: Rehabilitative and Restorative Service Providers" | Primary: Rehabilitative and Restorative Service Providers"

## 2023-03-26 ENCOUNTER — Ambulatory Visit: Admit: 2023-03-26 | Payer: BLUE CROSS/BLUE SHIELD

## 2023-03-26 NOTE — Unmapped (Addendum)
[facility]  OUTPATIENT PHYSICAL THERAPY  03/26/2023  Note Type: Evaluation       Patient Name: Tracy Robles  Date of Birth:07-26-1965  Diagnosis:   Encounter Diagnoses   Name Primary?    Chronic bilateral low back pain without sciatica Yes    Spondylolisthesis at L4-L5 level      Referring Provider: Birdie Sons    Date of Onset of Impairment: No date available  Date PT Care Plan Established or Reviewed: 03/26/2023  Date PT Treatment Started: 03/26/2023     Plan of Care Effective Date:   Session Number:  1     ASSESSMENT & PLAN   Assessment  Assessment details:    Tracy Robles is a 57 y.o. female presents with bilateral low back dysfunction which is limiting her ability to perform the functional activities listed below.  Based on patient's presentation in clinic today, signs and symptoms appear to be consistent with low back pain with movement coordination impairments with general deconditioning.  The patient presents with associated impairments outlined below impacting her current functional status as outlined below.  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  I anticipate the patient will require the specified duration and frequency of care to achieve her goals for rehab to return this more desirable level of function.          Impairments: pain, decreased strength, decreased range of motion, decreased knowledge of self-care and impaired ADLs    Other impairment: limited understanding of the rehabilitation process, exercise progression, and self-care    Personal Factors/Comorbidities: 1-2    Specific Comorbidities: HTN, DM-II, Obesity      Clinical Presentation: stable    Clinical Decision Making: low    Prognosis: excellent prognosis    Positive Prognosis Rationale: motivated for treatment and strength.  Negative Prognosis Rationale: caregiver/family support, Pain Status and chronicity of condition.      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  2) Pt will improve to >80 related norms to demonstrate improved LE endurance and gait speed.   3) Pt will demonstrate full, pain free ROM of lumbar extension to allow for improved walking tolerance with rotational movement.  4) Pt will be able to stand for >45 min to demonstrate improved activity tolerance.       Plan    Therapy options: will be seen for skilled physical therapy services    Planned therapy interventions: 97110-Therapeutic Exercises, 97112-Neuromuscular Re-education, 97140-Manual Therapy, 97164-Re-evaluation, 97530-Therapeutic Activities, 97535-Self-Care/Home Training and 97116-Gait Training    Other planned therapy interventions: Therapeutic Activity, Self-Care/Home Management Training, and all PT modalities as indicated      Frequency: 1x week (tapering frequency towards independence)    Duration in weeks: 10    Education provided to: patient.    Education provided: Treatment options and plan and HEP    Education results: needs reinforcement and needs further instruction.        Total Session Time: equates to total treatment time unless otherwise noted.          SUBJECTIVE         History  of Present Condition     History of Present Condition/Chief Complaint:  M54.50,G89.29 (ICD-10-CM) - Chronic bilateral low back pain without sciatica  M43.16 (ICD-10-CM) - Spondylolisthesis at L4-L5 level    Long history low back pain.  Right side worse than left.  Shooting pain down left leg over thigh occurring intermittent.  Primary pain right-sided axial low back pain.  Does note intermittent buttock pain.  Pain consistently worse with sitting for extended periods.  Denies any current radicular pain/paresthesia. Functional limitations include: pain with sitting for prolonged periods    Subjective:  My back is shifted forward and pops when I walk that is painful. That is the only time it hurts and its as soon as I start. If I stand too (30-40) long it starts to hurt or walk too long (2-3 hr), sitting bothers hips and back that varies in duration. Stretching backwards help and sideways.  The pain travels down to the lateral side of L leg. My R side was worst but now the L bothers.   I take of my 26 y.o mom. She takes care of herself but I have to watch her.    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)  Pain:     Current pain rating:  0    At best pain rating:  0    At worst pain rating:  5  Location:  Lower part of back is sharp, hip is vibrating and sharp    Quality:  Aching, tingling, sharp and stabbing    Relieving factors:  Rest and medications (Stretching)    Aggravating factors:  Standing, walking, stairs, sitting and lying  Precautions/Equipment   Precautions:  None    Current Braces/Orthoses:  None    Equipment Currently Used:  None  Prior Functional Status     No physical limitations    Current Functional Status:    disturbed sleep, limited lifting, limited standing tolerance, limited walking tolerance, limited sitting tolerance, limited bending, 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    Grade 1 anterolisthesis of L3 on L4. Mild left lateral listhesis of L3 on L4.    Mild to moderate multilevel degenerative changes. Findings are slightly progressed since 11/18/2018      Treatments:     Previous treatment:  Physical therapy    Current treatment: physical therapy    Patient Goals:     Patient/Family goals for therapy:  Increased strength, improved standing tolerance, improved sleep, improved sitting tolerance, improved ambulation and decreased pain    Other patient goals:  Increase driving time, cooking, standing 60+        OBJECTIVE     Outcome Measure: Revised Oswestry: NT    Posture/Observation:   Standing: unremarkable  Gait: non-antalgic  BIL hip drop L>R    Lumbar Spine ROM  Motion No ROM Loss  (0%) Min ROM Loss  (1-33%) Mod ROM Loss  (34-65%) Major ROM Loss  (66-100%) Symptoms    Flexion  X   Pain lower lumbar   Extension   x  Pain lower lumbar   Sidebend Right  X   Pain with overpressure   Side bend Left  X      Rotation R  X   No pain; OP not tested   Rotation L  X   No pain; OP not tested       Function:  30s STS (mat table 17 inches, no UE assist):   10 reps (rocking noted)  9 reps (without rocking, some LBP with rise and lower) with increased forward flexion  2 minute walk test: 366 ft  With no assistive device, 66% of norm 1.5 min in with pain    2 Minute Walk Test (2 MWT) Normative Data     Age (yr) Female   Mean Distance (ft)   20-29 636   30-39 593   40-49 590   50-59 554   Measure of self-paced walking ability and functional capacity  Bohannon RW. Normative reference values for the two-minute walk test derived by meta-analysis. J Phys Ther Sci. 2017 Dec; 29(12): 2224-2227.       TREATMENT RENDERED     Interpreter Use: Not applicable    Therapeutic Exercise:  10 Minutes   Performed with direct PT demonstration, instruction, supervision, and guidance.   - Education on condition, prognosis, and PT POC  - HEP review as below   -walking  -seated isometric hip flex    Manual Therapy:  Minutes     Next Visit Plan: core stabilization exercises not in supine or hooklying (plank, pallof), glute strengthening, quad strengthening, ODI, generals strength/conditioning      Total Treatment Time: 45 Minutes  PT Evaluation Charges  $$ PT Evaluation - Low Complexity [mins]-97001: 30     Therapeutic Interventions Charges  $$ Therapeutic Exercise [mins]: 10                 I attest that I have reviewed the above information.  Signed: Anabel Bene, PT, DPT  04/02/2023 1:28 PM        I reviewed the no-show/attendance policy with the patient and caregiver(s). The patient is aware that they must call to cancel appointments more than 24 hours in advance. They are also aware that if they late cancel or no-show three times, we reserve the right to cancel their remaining appointments. This policy is in place to allow [place] to best serve the needs of our caseload.    If patient returns to clinic with variance in plan of care, then it may be attributable to one or more of the following factors: preferred clinician availability, appointment time request availability, therapy pool appointment availability, major holiday with clinic closure, caregiver availability, patient transportation, conflicting medical appointment, inclement weather, and/or patient illness.    If patient does not return for follow up visit(s) related to this episode of care, this note will serve as their discharge note from Physical Therapy.    This care was provided during physical therapy residency mentored training.  I was present throughout and participated in the examination, assessment, treatment and plan of care.  Anabel Bene, PT, PT  March 26, 2023 2:54 PM

## 2023-04-01 NOTE — Unmapped (Signed)
[facility]  OUTPATIENT PHYSICAL THERAPY  04/01/2023  Note Type: Treatment Note       Patient Name: Tracy Robles  Date of Birth:Feb 05, 1966  Diagnosis:   Encounter Diagnoses   Name Primary?    Chronic bilateral low back pain without sciatica Yes    Spondylolisthesis at L4-L5 level      Referring Provider: Birdie Sons    Date of Onset of Impairment: No date available  Date PT Care Plan Established or Reviewed: 03/26/2023  Date PT Treatment Started: 03/26/2023     Plan of Care Effective Date: (1/14 auth until 06/30/23)  Session Number:  2     ASSESSMENT & PLAN   Assessment  Assessment details:    Today: Tracy Robles is a 57 y.o. female presents with bilateral low back dysfunction which is limiting her ability to perform the functional activities listed below. Patient able to progress with hip strengthening without reproduction of symptoms though fatigued requiring frequent rest breaks to complete further repetitions. The patient presents with associated impairments outlined below impacting her current functional status as outlined below.  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     Evaluation: Tracy Robles is a 57 y.o. female presents with bilateral low back dysfunction which is limiting her ability to perform the functional activities listed below.  Based on patient's presentation in clinic today, signs and symptoms appear to be consistent with low back pain with movement coordination impairments with general deconditioning.  The patient presents with associated impairments outlined below impacting her current functional status as outlined below.  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  I anticipate the patient will require the specified duration and frequency of care to achieve her goals for rehab to return this more desirable level of function.          Impairments: pain, decreased strength, decreased range of motion, decreased knowledge of self-care and impaired ADLs    Other impairment: limited understanding of the rehabilitation process, exercise progression, and self-care    Personal Factors/Comorbidities: 1-2    Specific Comorbidities: HTN, DM-II, Obesity      Clinical Presentation: stable    Clinical Decision Making: low    Prognosis: excellent prognosis    Positive Prognosis Rationale: motivated for treatment and strength.  Negative Prognosis Rationale: caregiver/family support, Pain Status and chronicity of condition.      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  2) Pt will improve to >80 related norms to demonstrate improved LE endurance and gait speed.   3) Pt will demonstrate full, pain free ROM of lumbar extension to allow for improved walking tolerance with rotational movement.  4) Pt will be able to stand for >45 min to demonstrate improved activity tolerance.       Plan    Therapy options: will be seen for skilled physical therapy services    Planned therapy interventions: 97110-Therapeutic Exercises, 97112-Neuromuscular Re-education, 97140-Manual Therapy,  97164-Re-evaluation, 97530-Therapeutic Activities, 97535-Self-Care/Home Training and 97116-Gait Training    Other planned therapy interventions: Therapeutic Activity, Self-Care/Home Management Training, and all PT modalities as indicated      Frequency: 1x week (tapering frequency towards independence)    Duration in weeks: 10    Education provided to: patient.    Education provided: Treatment options and plan and HEP    Education results: needs reinforcement and needs further instruction.        Total Session Time: equates to total treatment time unless otherwise noted.          SUBJECTIVE         History of Present Condition     History of Present Condition/Chief Complaint:  M54.50,G89.29 (ICD-10-CM) - Chronic bilateral low back pain without sciatica  M43.16 (ICD-10-CM) - Spondylolisthesis at L4-L5 level    Long history low back pain.  Right side worse than left.  Shooting pain down left leg over thigh occurring intermittent.  Primary pain right-sided axial low back pain.  Does note intermittent buttock pain.  Pain consistently worse with sitting for extended periods.  Denies any current radicular pain/paresthesia.     Functional limitations include: pain with sitting for prolonged periods    Subjective:  Today: Doing okay. Doing the exercises.     Evaluation: My back is shifted forward and pops when I walk that is painful. That is the only time it hurts and its as soon as I start. If I stand too (30-40) long it starts to hurt or walk too long (2-3 hr), sitting bothers hips and back that varies in duration. Stretching backwards help and sideways.  The pain travels down to the lateral side of L leg. My R side was worst but now the L bothers.   I take of my 47 y.o mom. She takes care of herself but I have to watch her.    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)  Pain:     Current pain rating:  0    At best pain rating:  0    At worst pain rating:  5  Location:  Lower part of back is sharp, hip is vibrating and sharp    Quality:  Aching, tingling, sharp and stabbing    Relieving factors:  Rest and medications (Stretching)    Aggravating factors:  Standing, walking, stairs, sitting and lying  Precautions/Equipment   Precautions:  None    Current Braces/Orthoses:  None    Equipment Currently Used:  None  Prior Functional Status     No physical limitations    Current Functional Status:    disturbed sleep, limited lifting, limited standing tolerance, limited walking tolerance, limited sitting tolerance, limited bending, 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    Grade 1 anterolisthesis of L3 on L4. Mild left lateral listhesis of L3 on L4.    Mild to moderate multilevel degenerative changes. Findings are slightly progressed since 11/18/2018      Treatments:     Previous treatment:  Physical therapy    Current treatment: physical therapy    Patient Goals:     Patient/Family goals for therapy:  Increased strength, improved standing tolerance, improved sleep, improved sitting tolerance, improved ambulation and decreased pain    Other patient goals:  Increase driving  time, cooking, standing 60+        OBJECTIVE     Outcome Measure: Revised Oswestry: Oswestry Score: 21 / 50 or 42 %     Posture/Observation:   Standing: unremarkable  Gait: non-antalgic  BIL hip drop L>R    Lumbar Spine ROM  Motion No ROM Loss  (0%) Min ROM Loss  (1-33%) Mod ROM Loss  (34-65%) Major ROM Loss  (66-100%) Symptoms    Flexion  X   Pain lower lumbar   Extension   x  Pain lower lumbar   Sidebend Right  X   Pain with overpressure   Side bend Left  X      Rotation R  X   No pain; OP not tested   Rotation L   X   No pain; OP not tested       Function:  30s STS (mat table 17 inches, no UE assist):   10 reps (rocking noted)  9 reps (without rocking, some LBP with rise and lower) with increased forward flexion  2 minute walk test: 366 ft  With no assistive device, 66% of norm 1.5 min in with pain    2 Minute Walk Test (2 MWT) Normative Data     Age (yr) Female   Mean Distance (ft)   20-29 636   30-39 593   40-49 590   50-59 554   Measure of self-paced walking ability and functional capacity  Bohannon RW. Normative reference values for the two-minute walk test derived by meta-analysis. J Phys Ther Sci. 2017 Dec; 29(12): 2224-2227.       TREATMENT RENDERED     Interpreter Use: Not applicable    Therapeutic Exercise:  40 Minutes   Performed with direct PT demonstration, instruction, supervision, and guidance.   - Education on condition, prognosis, and PT POC  - HEP review as below   - Standing hip abduction/extension GTB latex free  - Seated trunk rotation  - Lateral band walk GTB/Monster walk  - Seated deadbug    Manual Therapy: 0  Minutes     Next Visit Plan: core stabilization exercises not in supine or hooklying (plank, pallof), glute strengthening, quad strengthening, generals strength/conditioning      Total Treatment Time: 40 Minutes        Therapeutic Interventions Charges  $$ Therapeutic Exercise [mins]: 40                 I attest that I have reviewed the above information.  Signed: Army Chaco, PT, DPT  04/01/2023 1:29 PM        I reviewed the no-show/attendance policy with the patient and caregiver(s). The patient is aware that they must call to cancel appointments more than 24 hours in advance. They are also aware that if they late cancel or no-show three times, we reserve the right to cancel their remaining appointments. This policy is in place to allow [place] to best serve the needs of our caseload.    If patient returns to clinic with variance in plan of care, then it may be attributable to one or more of the following factors: preferred clinician availability, appointment time request availability, therapy pool appointment availability, major holiday with clinic closure, caregiver availability, patient transportation, conflicting medical appointment, inclement weather, and/or patient illness.    If patient does not return for follow up visit(s) related to this episode of care, this note will serve as their discharge note from Physical Therapy.  This care was provided during physical therapy residency mentored training.  I was present throughout and participated in the examination, assessment, treatment and plan of care.  Army Chaco, PT, PT  March 26, 2023 2:54 PM

## 2023-04-03 NOTE — Unmapped (Unsigned)
[facility] Specialty and Home Delivery Pharmacy Refill Coordination Note    Specialty Medication(s) to be Shipped:   Inflammatory Disorders: Cosentyx and Specialty Lite: Mounjaro    Other medication(s) to be shipped: {Blank:19197::***,No additional medications requested for fill at this time}     [employer], DOB: 1966-02-27  Phone: There are no phone numbers on file.      All above HIPAA information was verified with {Blank:19197::patient.,patient's caregiver, ***,patient's family member, ***.}     Was a Nurse, learning disability used for this call? {Blank single:19197::Yes, ***. Patient language is appropriate in WAM,No}    Completed refill call assessment today to schedule patient's medication shipment from the [facility] Delivery Pharm[facility]  [phone]).  All relevant notes have been reviewed.     Specialty medication(s) and dose(s) confirmed: {Blank:19197::Regimen is correct and unchanged.,Patient reports changes to the regimen as follows: ***}   Changes to medications: {Blank:19197::Skyley reports starting the following medications: ***,Adaysha reports stopping the following medications: ***,Rosalynd reports no changes at this time.}  Changes to insurance: {Blank:19197::Yes: ***,No}  New side effects reported not previously addressed with a pharmacist or physician: {sscrefillsideeffects:78475}  Questions for the pharmacist: {Blank:19197::Yes: ***,No}    Confirmed patient received a Conservation officer, historic buildings and a Surveyor, mining with first shipment. The patient will receive a drug information handout for each medication shipped and additional FDA Medication Guides as required.       DISEASE/MEDICATION-SPECIFIC INFORMATION        {clinicspecificinstructions:59274}    SPECIALTY MEDICATION ADHERENCE              Were doses missed due to medication being on hold? {Blank:19197::No,Yes - ***}    Cosentyx 150 mg/ml: *** {Blank:19197::days,doses} of medicine on hand   Mounjaro 10 mg/0.51ml : *** {Blank:19197::days,doses} of medicine on hand       REFERRAL TO PHARMACIST     Referral to the pharmacist: {SSCRefertoRPH:77899}      SHIPPING     Shipping address confirmed in Epic.       Delivery Scheduled: {Blank:19197::Yes, Expected medication delivery date: ***.,Yes, Expected medication delivery date: ***.  However, Rx request for refills was sent to the provider as there are none remaining.,Patient declined refill at this time due to ***.,No, cannot schedule delivery at this time as there are outstanding items that need addressed.  This note has been handed off to the provider for follow up.,Due to patient insurance changes, unable to fill at [facility], please route Rx to *** specialty pharmacy}     Medication will be delivered via {Blank:19197::UPS,Next Day Courier,Same Day Courier,Clinic Courier - *** clinic,***} to the {Blank:19197::prescription,temporary} address in Epic WAM.    Norva Karvonen   [facility]  Specialty {Blank:19197::Pharmacist,Technician}

## 2023-04-07 NOTE — Unmapped (Signed)
[facility] Delivery Pharm[facility] Refill Coordination Note    Specialty Medication(s) to be Shipped:   Inflammatory Disorders: Cosentyx  ** declined mounjaro states has (4) doses still   Other medication(s) to be shipped: No additional medications requested for fill at this time     Mardel Marsico, DOB: 25-Feb-1966  Phone: There are no phone numbers on file.      All above HIPAA information was verified with patient.     Was a Nurse, learning disability used for this call? No    Completed refill call assessment today to schedule patient's medication shipment from the [facility] and Home Delivery Pharmacy  [phone]).  All relevant notes have been reviewed.     Specialty medication(s) and dose(s) confirmed: Regimen is correct and unchanged.   Changes to medications: Nil reports starting the following medications: meloxicam   Changes to insurance: No  New side effects reported not previously addressed with a pharmacist or physician: None reported  Questions for the pharmacist: No    Confirmed patient received a Conservation officer, historic buildings and a Surveyor, mining with first shipment. The patient will receive a drug information handout for each medication shipped and additional FDA Medication Guides as required.       DISEASE/MEDICATION-SPECIFIC INFORMATION        For patients on injectable medications: Patient currently has 0 doses left.  Next injection is scheduled for 12/28.    SPECIALTY MEDICATION ADHERENCE     Medication Adherence    Patient reported X missed doses in the last month: 0  Specialty Medication: cosentyx  Patient is on additional specialty medications: No  Patient is on more than two specialty medications: No  Any gaps in refill history greater than 2 weeks in the last 3 months: no  Demonstrates understanding of importance of adherence: yes  Informant: patient  Reliability of informant: reliable  Provider-estimated medication adherence level: good  Patient is at risk for Non-Adherence: No  Reasons for non-adherence: no problems identified  Confirmed plan for next specialty medication refill: delivery by pharmacy  Refills needed for supportive medications: not needed          Refill Coordination    Has the Patients' Contact Information Changed: No  Is the Shipping Address Different: No         Were doses missed due to medication being on hold? No    cosentyx 150 mg/ml: 0 days of medicine on hand       REFERRAL TO PHARMACIST     Referral to the pharmacist: Not needed      [facility]     Shipping address confirmed in Epic.       Delivery Scheduled: Yes, Expected medication delivery date: 12/27.     Medication will be delivered via Same Day Courier to the prescription address in Epic WAM.    Antonietta Barcelona   [facility] Delivery [facility]  Specialty Technician

## 2023-04-07 NOTE — Unmapped (Signed)
Assessment and Plan:     Annual physical exam  -     PNEUMOCOCCAL CONJUGATE VACCINE 20-VALENT  -     Comprehensive Metabolic Panel  -     Lipid Panel  -     TSH    Colon cancer screening  -     Colorectal Cancer DNA + FIT    Type 2 diabetes mellitus with hyperglycemia, without long-term current use of insulin (CMS-HCC)  -     Albumin/creatinine urine ratio  -     TSH    Tenosynovitis of thumb  -     Ambulatory Referral to Orthopedics; Future    Encounter for screening for malignant neoplasm of breast, unspecified screening modality  -     Mammo Digital Screening Bilateral; Future      Multiple diagnoses above reviewed with patient  XR wrist from 02/25/23 reviewed and discussed with patient to the best of my ability. Relevant urgent care note reviewed.  R wrist pain c/w tendinitis. Relevant anatomy, definition, and treatment reviewed.  Congratulated patient on weight loss; continue this.   Discussed patient can schedule with me for [facility] if she wishes to switch providers.   Continue current medication management  Referrals/labs as per orders above  Checking UAC, lipid panel, CMP, TSH today.   Cologuard ordered per patient preference. Mammo ordered.   Referral to Ortho placed today for further management of right wrist tendinitis.   Recommend supportive treatment with lifestyle modifications with diet/exercise  Follow up in 2 weeks or sooner prn      Barriers to recommended plan: None identified       Return in about 2 weeks (around 04/24/2023) for for Pap smear/ gynecological examination.    Subjective:     HPI: Tracy Robles is a 57 y.o. female here for annual physical exam. Last seen by me 06/12/2022 for blood pressure recheck. Patient with h/o HTN, DM2, HLD, obesity, chronic back pain, psoriasis.    HM  Due for pneumo vaccine and COVID booster. Patient agreeable to updating Prevnar 20 vaccine today. CRC screening due, Cologuard last completed 01/29/20 with repeat in 3 years. Pap due, last completed 10/19/17 with repeat in 3 years. Mammogram due.     DM2 / weight  Managing with Jardiance 10mg  daily and Mounjaro 10mg  weekly injection. Patient also taking topiramate 50mg  nightly for weight loss. Last A1c was 5.5 (02/24/23). Patient continues to work on healthy diet and weight loss. She is following with Dr. Loa Socks for [facility] and RD [name].            04/10/23 (!) 130.9 kg (288 lb 9.6 oz)   02/25/23 (!) 133.4 kg (294 lb)   02/06/23 (!) 133.4 kg (294 lb)     Hyperlipidemia  On simvastatin 10mg  daily and tolerating well.    Hypertension  Patient with history of hypertension manages with amlodipine 10mg  daily, spironolactone 25mg  daily, losartan 100mg  daily, and HCTZ 25mg  daily. Blood pressure at triage today 145/85. Denies CP, SOB, L/D or peripheral edema.    BP Readings from Last 3 Encounters:   04/10/23 145/85   02/25/23 150/88   02/06/23 158/86     R wrist pain  Patient was seen in urgent care for right wrist pain 02/25/23 and had imaging done. XR Wrist 02/25/23 IMPRESSION: Thumb MCP osteoarthrosis. No acute osseous abnormality. She is wearing a wrist brace today. Reports the thumb spica splint she was given hurts her wrist. Patient did not feel meloxicam helped  for pain control. 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     HPI     ROS:   Review of Systems     Review of systems negative unless otherwise noted as per HPI.      The following portions of the patient's history were reviewed and updated as appropriate: allergies, current medications, past family history, past medical history, past social history, past surgical history and problem list.     Objective:     Vitals:    04/10/23 0854   BP: 145/85   Pulse: 74   Temp: 36.7 ??C (98 ??F)     Body mass index is 52.77 kg/m??.    Physical Exam  Vitals and nursing note reviewed.   Constitutional:       General: She is not in acute distress.     Appearance: Normal appearance. She is not ill-appearing or toxic-appearing.   HENT:      Head: Normocephalic and atraumatic.      Mouth/Throat:      Mouth: Mucous membranes are moist.      Pharynx: Oropharynx is clear.   Neck:      Thyroid: No thyroid mass, thyromegaly or thyroid tenderness.   Cardiovascular:      Rate and Rhythm: Normal rate and regular rhythm.      Heart sounds: Normal heart sounds.   Pulmonary:      Effort: Pulmonary effort is normal. No respiratory distress.      Breath sounds: Normal breath sounds.   Abdominal:      General: Abdomen is flat. Bowel sounds are normal.      Palpations: Abdomen is soft.   Musculoskeletal:      Right wrist: Tenderness present. Decreased range of motion.      Cervical back: Neck supple.   Neurological:      General: No focal deficit present.      Mental Status: She is alert and oriented to person, place, and time.          Allergies:     Lisinopril, Latex, and Penicillins    [facility]:     Medication adherence and barriers to the treatment plan have been addressed. Opportunities to optimize healthy behaviors have been discussed. Patient / caregiver voiced understanding.      I attest that I, Zuling F Quade, personally documented this note while acting as scribe for Payton Mccallum, MD.      Ranee Gosselin, Scribe.  04/10/2023     The documentation recorded by the scribe accurately reflects the service I personally performed and the decisions made by me.    Payton Mccallum, MD

## 2023-04-10 ENCOUNTER — Ambulatory Visit: Admit: 2023-04-10 | Discharge: 2023-04-11 | Payer: BLUE CROSS/BLUE SHIELD

## 2023-04-10 DIAGNOSIS — Z0000 Encounter for general adult medical examination without abnormal findings: Principal | ICD-10-CM

## 2023-04-10 DIAGNOSIS — Z1239 Encounter for other screening for malignant neoplasm of breast: Principal | ICD-10-CM

## 2023-04-10 DIAGNOSIS — M65949 Tenosynovitis of thumb: Principal | ICD-10-CM

## 2023-04-10 DIAGNOSIS — E1165 Type 2 diabetes mellitus with hyperglycemia: Principal | ICD-10-CM

## 2023-04-10 DIAGNOSIS — Z1211 Encounter for screening for malignant neoplasm of colon: Principal | ICD-10-CM

## 2023-04-10 DIAGNOSIS — E870 Hyperosmolality and hypernatremia: Principal | ICD-10-CM

## 2023-04-10 LAB — COMPREHENSIVE METABOLIC PANEL
ALBUMIN: 3.7 g/dL (ref 3.4–5.0)
ALKALINE PHOSPHATASE: 63 U/L (ref 46–116)
ALT (SGPT): 13 U/L (ref 10–49)
ANION GAP: 11 mmol/L (ref 5–14)
AST (SGOT): 15 U/L (ref <=34)
BILIRUBIN TOTAL: 0.6 mg/dL (ref 0.3–1.2)
BLOOD UREA NITROGEN: 17 mg/dL (ref 9–23)
BUN / CREAT RATIO: 19
CALCIUM: 9.7 mg/dL (ref 8.7–10.4)
CHLORIDE: 109 mmol/L — ABNORMAL HIGH (ref 98–107)
CO2: 27.3 mmol/L (ref 20.0–31.0)
CREATININE: 0.88 mg/dL (ref 0.55–1.02)
EGFR CKD-EPI (2021) FEMALE: 77 mL/min/{1.73_m2} (ref >=60)
GLUCOSE RANDOM: 100 mg/dL — ABNORMAL HIGH (ref 70–99)
POTASSIUM: 4.7 mmol/L (ref 3.4–4.8)
PROTEIN TOTAL: 7.1 g/dL (ref 5.7–8.2)
SODIUM: 147 mmol/L — ABNORMAL HIGH (ref 135–145)

## 2023-04-10 LAB — ALBUMIN / CREATININE URINE RATIO
ALBUMIN QUANT URINE: <0.3 mg/dL
CREATININE, URINE: 160.9 mg/dL

## 2023-04-10 LAB — LIPID PANEL
CHOLESTEROL/HDL RATIO SCREEN: 2.8 (ref 1.0–4.5)
CHOLESTEROL: 142 mg/dL (ref <=200)
HDL CHOLESTEROL: 50 mg/dL (ref 40–60)
LDL CHOLESTEROL CALCULATED: 79 mg/dL (ref 40–99)
NON-HDL CHOLESTEROL: 92 mg/dL (ref 70–130)
TRIGLYCERIDES: 63 mg/dL (ref 0–150)
VLDL CHOLESTEROL CAL: 12.6 mg/dL (ref 11–40)

## 2023-04-10 LAB — TSH: THYROID STIMULATING HORMONE: 1.875 u[IU]/mL (ref 0.550–4.780)

## 2023-04-10 MED FILL — COSENTYX PEN 300 MG/2 PENS (150 MG/ML) SUBCUTANEOUS: SUBCUTANEOUS | 28 days supply | Qty: 2 | Fill #1

## 2023-04-10 NOTE — Unmapped (Signed)
Patient in clinic for office visit, Prevnar 20 - Pneumococcal 20valent vaccine administered per protocol,NCIR and/or EPIC chart reviewed and vaccine accuracy verified with teammates: Renold Genta, CMA, patient eligible to receive vacstock: Private Vaccine, vaccine administered Left Deltoid, pt tolerated well, no s/s of a reaction noted, VIS given to patient

## 2023-04-21 ENCOUNTER — Ambulatory Visit: Admit: 2023-04-21 | Discharge: 2023-04-22 | Payer: BLUE CROSS/BLUE SHIELD

## 2023-04-21 DIAGNOSIS — M4316 Spondylolisthesis, lumbar region: Principal | ICD-10-CM

## 2023-04-21 DIAGNOSIS — M545 Chronic bilateral low back pain without sciatica: Principal | ICD-10-CM

## 2023-04-21 DIAGNOSIS — G8929 Other chronic pain: Principal | ICD-10-CM

## 2023-04-21 MED ORDER — MELOXICAM 15 MG TABLET
ORAL_TABLET | Freq: Every day | ORAL | 1 refills | 30.00 days | Status: CP | PRN
Start: 2023-04-21 — End: ?

## 2023-04-21 NOTE — Unmapped (Addendum)
ORTHOPAEDIC SPINE CLINIC NOTE       Tracy Robles. Tracy Picket, DNP  Nurse Practitioner  [email or web address]  [phone]        Patient Name:Tracy Robles  MRN: [id number]  DOB: 1965-05-31    Date: 04/21/2023    PCP: Payton Mccallum, MD    ASSESSMENT:     1. Chronic bilateral low back pain without sciatica    2. Spondylolisthesis at L4-L5 level      Tracy Robles is a 58 y.o. female with chronic R>L axial low back pain.  Known L4-L5 spondylolisthesis.  Physical exam suggestive of primarily right lumbar facet joint pain (+ right facet loading test).    PROM:  None     PLAN and RECOMMENDATIONS:     Options reviewed with patient.  Patient would like to continue physical therapy and continue meloxicam for symptom management.  We discussed obtaining a lumbar MRI, discussing both spine injections and surgical options.  Given BMI, would recommend reserving surgery as a last resort.  We also discussed a daily walking program with a goal of walk at least 30 minutes a day.  If symptoms do not improve, we will obtain a lumbar MRI to further evaluate.    Future Considerations:  -Lumbar MRI    Patient Instructions   Medications: Take over-the-counter medications as needed and as tolerated  If you don't have liver disease, the safest medication for pain is acetaminophen Extended Release (Tylenol Arthritis).  Take 650mg  (1 extended release tab) at a time for pain relief every 8 hours.  Do not take more than 3000mg  per day.  Continue Meloxicam 15mg .  Take as needed as prescribed..  Do not take with other NSAIDs (ie Motrin, Aleve, etc).  You may take with Acetaminophen as listed above.   Activity: Activities as tolerated.  Recommend progressive/daily walking.  The goal is to walk 30 minutes per day as able.   Conservative Care: Continue Physical Therapy and/or Home Exercises as doing.  Surgical Care: Let's continue to reserve surgical care as a last resort.  Imaging studies: none  Labs: None  Return for re-evaluation with Ortho Spine as needed.   If symptoms do not improve with physical therapy or if symptoms worsen, you can contact me and I'll order a lumbar MRI.    Contact our nursing team via MyChart or telephone with any clinical questions/concerns.  Our scheduling team and support staff can be reached at [phone].     No orders of the defined types were placed in this encounter.    Medications Prescribed Today               meloxicam (MOBIC) 15 MG tablet Take 1 tablet (15 mg total) by mouth daily as needed for pain.             SUBJECTIVE:     Chief Complaint:  Chief Complaint   Patient presents with    Back Pain       History of Present Illness:        04/21/23 1020   PainSc: 3        Tracy Robles is a 58 y.o. female with a relevant PMH of DM, HTN, psoriasis seen in consultation at the request of Arlana Pouch* for evaluation of low back pain. Date of onset: since chronic.  Frequency: Constant and Getting Worse  Modifying factors: worse with sitting  Associated symptoms: stiffness    Established Patient Interval History (04/21/2023):  Patient last seen  01/22/2023.  Today, patient reports symptoms are gradually improving.  Has completed 2 sessions of physical therapy at the [facility].  She does feel like she has made some small improvements in her low back pain.  She has found the past 2 sessions of physical therapy helpful.  She is taking meloxicam 15 mg.  Continued axial low back pain , right worse than left.     Recent accidents/injuries/falls? no  Recent Relevant ED/Urgent Care Encounters? no  New or worsening neurologic symptoms?  No    Functional limitations include: pain with sitting for prolonged periods    Current Treatments Previous Treatments   Current Relevant Pain Medications:  NSAID: Meloxicam 15mg    AED: topiramate 50mg  nightly    Current Physical Therapy: Current Home Exercise Program and PT Recently completed    Adjunct Treatments: Activity Modification    In a Pain Clinic: No Prior Relevant Pain Medications:  NSAID: Aleve  AED: Gabapentin 600mg  BID    Prior Physical Therapy: Yes.  - PT 03/2023  - PT 03/2021 for low back    Injections:None.    Prior Relevant Surgeries: None.        Medical History   She  has a past medical history of Allergic (Don't know), Arthritis (2011), BMI 60.0-69.9, adult (CMS-HCC) (05/29/2021), Diabetes mellitus (CMS-HCC), Eczema, High blood pressure, and Psoriasis.     Surgical History   She  has no past surgical history on file.     Allergies   Lisinopril, Latex, and Penicillins   Medications   She has a current medication list which includes the following prescription(s): amlodipine, biotin, glucose blood, blood-glucose meter, cetirizine, clobetasol, clobetasol, empagliflozin, hydrochlorothiazide, ketoconazole, loratadine, losartan, multivitamin gummies, onetouch delica plus lancet, cosentyx pen (2 pens), simvastatin, spironolactone, mounjaro, topiramate, triamcinolone, gabapentin, and meloxicam.   Review of Systems A 10-system review was performed by questionnaire and noted in the electronic chart.  Positives noted/discussed.  Balance of systems was negative.    Fever/chills: denies  Recent unexplained weight loss: denies  Bowel/bladder symptoms: denies   Family History Her family history includes Arthritis in her mother; Asthma in her father; Breast cancer in her maternal aunt; Clotting disorder in her father; Diabetes in her father and sister; Hypertension in her father and mother; Kidney disease in her father; Miscarriages / Stillbirths in her maternal grandmother and sister; Stroke in her maternal grandmother.     Social History She  reports that she has never smoked. She has been exposed to tobacco smoke. She has never used smokeless tobacco. She reports current alcohol use of about 1.0 standard drink of alcohol per week. She reports that she does not use drugs.        Occupational History    Not on file        OBJECTIVE:     PHYSICAL EXAM:  Vitals: Temp 36.7 ??C (98 ??F)  - Wt (!) 130.1 kg (286 lb 12.8 oz)  - BMI 52.44 kg/m??   Appearance: well-nourished and no acute distress   Skin: No cyanosis or clubbing in bilat hands.  Mental Status: Gayleen Blankley is oriented to time, place, and person & is alert and cooperative  Motor: Normal bulk and tone without evidence of pronator drift or fasciculations. No abnormal movements.  Gait: normal.    Strength: Grossly intact bilateral lower extremities  Sensory: Sensation to light touch intact throughout.         MEDICAL DECISION MAKING    Test Results:  Imaging:   L-spine  XR. [place]. Date:01/2023.  Impression: Grade 2 anterolisthesis L4 on L5.  Grade 1 anterolisthesis of L3 on L4. Mild left lateral listhesis of L3 on L4.    SI Joint XR.  [place]. Date:11/2022.  Impression: Mild bilateral SI joint OA.    L-spine XR. . Date:11/2018.  Impression: Grade 2 anterolisthesis L4-L5      I, Duke Salvia, NP, personally interpreted the images. Images were reviewed with the patient on the PACS monitor. The available reports were reviewed.    Labs:  Lab Results   Component Value Date    A1C 5.5 02/24/2023       Discussion:  Clinical findings, diagnostic/treatment options, and plan were discussed with the patient.  Activities - Advised activities as tolerated, using pain as a guide.  Conservative Care - Options discussed.  Imaging - Discussed pros/cons of advanced imaging options.  Medications - risks/benefits of meloxicam were discussed.      E&M Coding:    MEDICAL DECISION MAKING (level of service defined by 2/3 elements)     Number/Complexity of Problems Addressed 1 stable chronic illness (99203/99213)   Amount/Complexity of Data to be Reviewed/Analyzed 2 points: Review prior notes (1 point per unique source); Review test results (1 point per unique test); Order tests (1 point per unique test) (99203/99213)   Risk of Complications/Morbidity/Mortality of Management Prescription Medication (99204/99214)     Or TIME     Total Time for E/M Services on the Date of Encounter N/A          cc: Barnett Hatter, MD

## 2023-04-21 NOTE — Unmapped (Addendum)
Medications: Take over-the-counter medications as needed and as tolerated  If you don't have liver disease, the safest medication for pain is acetaminophen Extended Release (Tylenol Arthritis).  Take 650mg  (1 extended release tab) at a time for pain relief every 8 hours.  Do not take more than 3000mg  per day.  Continue Meloxicam 15mg .  Take as needed as prescribed..  Do not take with other NSAIDs (ie Motrin, Aleve, etc).  You may take with Acetaminophen as listed above.   Activity: Activities as tolerated.  Recommend progressive/daily walking.  The goal is to walk 30 minutes per day as able.   Conservative Care: Continue Physical Therapy and/or Home Exercises as doing.  Surgical Care: Let's continue to reserve surgical care as a last resort.  Imaging studies: none  Labs: None  Return for re-evaluation with Ortho Spine as needed.   If symptoms do not improve with physical therapy or if symptoms worsen, you can contact me and I'll order a lumbar MRI.    Contact our nursing team via MyChart or telephone with any clinical questions/concerns.  Our scheduling team and support staff can be reached at [phone].

## 2023-04-23 LAB — COLOGUARD: COLOGUARD: NEGATIVE

## 2023-04-23 LAB — EXTERNAL GENERIC LAB PROCEDURE: COLOGUARD: NEGATIVE

## 2023-04-24 ENCOUNTER — Inpatient Hospital Stay: Admit: 2023-04-24 | Discharge: 2023-04-25 | Payer: BLUE CROSS/BLUE SHIELD

## 2023-04-24 NOTE — Unmapped (Unsigned)
INTERIM HISTORY:  Tracy Robles returns today for evaluation of her right thumb tenosynovitis. She was last seen March 2023 for left wrist mass as well as numbness and tingling in bilateral hands most notably in the radial 3 digits.  ***    PHYSICAL EXAMINATION:  EXTREMITIES:    - Examination of the right upper extremity shows she is neuovascularly intact with normal sensation and strength throughout.  She has full and painless range of motion with no instability.  Skin is intact.  Fingers are warm and well perfused with brisk capillary refill and she has a palpable radial pulse.  There is no tenderness to palpation throughout. ***    IMAGING:  Images were personally reviewed and interpreted by Dr. Jarold Motto.    Xrays of right wrist taken 02/25/2023 showed thumb MCP osteoarthrosis. No acute osseous abnormality.  ***    ASSESSMENT:   1.  EMG-proven mild left carpal tunnel syndrome   2.  Left dorsal hand ganglion  3.  Left fourth dorsal compartment tendonitis     PLAN:  Tracy Robles and I discussed her symptoms and treatment options. ***

## 2023-04-27 NOTE — Unmapped (Unsigned)
Assessment and Plan:     There are no diagnoses linked to this encounter.  Multiple diagnoses above reviewed with patient  Continue current medication management  Rx as per orders; reviewed risks/benefits, possible side effects and patient verbalizes understanding  Referrals/labs as per orders above  Recommend supportive treatment with lifestyle modifications with diet/exercise  Follow up in 3-4 months or sooner prn      Barriers to recommended plan: {barrierstocare:74100}    I provided an intervention for the {SDOH ACZYSA:63016} SDOH domain. The intervention was {SDOH INTERVENTION:69735}     PHQ-2 Score:      PHQ-9 Score:      {select_status_or_delete_smartlist:64641}      I personally spent *** minutes face-to-face and non-face-to-face in the care of this patient, which includes all pre, intra, and post visit time (reviewing, evaluating and discussing pertinent records for patient's care) on the date of service.     No follow-ups on file.    Subjective:     HPI: Tracy Robles is a 58 y.o. female here for pap smear.    HM  Due for pap smear; agreeable to updating today. Pap last completed 10/19/2017 and normal with repeat in 3 years.    HPI       ROS:   Review of Systems     Review of systems negative unless otherwise noted as per HPI.      The following portions of the patient's history were reviewed and updated as appropriate: allergies, current medications, past family history, past medical history, past social history, past surgical history and problem list.     Objective:     There were no vitals filed for this visit.  There is no height or weight on file to calculate BMI.    Physical Exam     Allergies:     Lisinopril, Latex, and Penicillins    [facility]:     Medication adherence and barriers to the treatment plan have been addressed. Opportunities to optimize healthy behaviors have been discussed. Patient / caregiver voiced understanding.      I attest that I, Letha Mirabal F Fannie Gathright, personally documented this note while acting as scribe for Payton Mccallum, MD.      Ranee Gosselin, Scribe.  04/30/2023     The documentation recorded by the scribe accurately reflects the service I personally performed and the decisions made by me.    Payton Mccallum, MD

## 2023-05-01 ENCOUNTER — Ambulatory Visit: Admit: 2023-05-01 | Discharge: 2023-05-02 | Payer: BLUE CROSS/BLUE SHIELD

## 2023-05-01 DIAGNOSIS — Z01419 Encounter for gynecological examination (general) (routine) without abnormal findings: Principal | ICD-10-CM

## 2023-05-01 NOTE — Unmapped (Signed)
Assessment and Plan:     Visit for gynecologic examination  -     Pap Test  -     Ambulatory Referral to Ob-Gyn; Future      diagnoses above reviewed with patient  Continue current medication management  Pap sample unable to be obtained. Unable to visualize patient's cervix; patient opted to discontinue gynecologic exam without obtaining sample.   Referral to gynecology placed today for gynecologic exam and pap.   Recommend supportive treatment with lifestyle modifications with diet/exercise      Barriers to recommended plan: None identified    I personally spent 20 minutes face-to-face and non-face-to-face in the care of this patient, which includes all pre, intra, and post visit time (reviewing, evaluating and discussing pertinent records for patient's care) on the date of service.     No follow-ups on file.    Subjective:     HPI: Tracy Robles is a 58 y.o. female here for pap smear. Doing well overall today.     HM  Due for pap smear; agreeable to updating today. Pap last completed 10/19/2017 and normal with repeat in 3 years. Patient reports she likely had her last pap done at a gynecology office.     HPI     ROS:   Review of Systems     Review of systems negative unless otherwise noted as per HPI.      The following portions of the patient's history were reviewed and updated as appropriate: allergies, current medications, past family history, past medical history, past social history, past surgical history and problem list.     Objective:     Vitals:    05/01/23 1557   BP: 147/73   Pulse: 76   Temp: 36.6 ??C (97.9 ??F)     Body mass index is 53.06 kg/m??.    Physical Exam  Vitals and nursing note reviewed. Exam conducted with a chaperone present.   Constitutional:       General: She is not in acute distress.     Appearance: Normal appearance. She is not ill-appearing or toxic-appearing.   Cardiovascular:      Rate and Rhythm: Normal rate and regular rhythm.      Heart sounds: Normal heart sounds.   Pulmonary: Effort: Pulmonary effort is normal. No respiratory distress.      Breath sounds: Normal breath sounds.   Genitourinary:     General: Normal vulva.      Vagina: Normal.   Musculoskeletal:      Cervical back: Neck supple.   Neurological:      Mental Status: She is alert.          Allergies:     Lisinopril, Latex, and Penicillins    [facility]:     Medication adherence and barriers to the treatment plan have been addressed. Opportunities to optimize healthy behaviors have been discussed. Patient / caregiver voiced understanding.      I attest that I, Zuling F Quade, personally documented this note while acting as scribe for Payton Mccallum, MD.      Ranee Gosselin, Scribe.  05/01/2023     The documentation recorded by the scribe accurately reflects the service I personally performed and the decisions made by me.    Payton Mccallum, MD

## 2023-05-04 NOTE — Unmapped (Signed)
[facility] Refill Coordination Note    Specialty Medication(s) to be Shipped:   Inflammatory Disorders: Cosentyx  ** declined mounjaro states has (4) doses still   Other medication(s) to be shipped: No additional medications requested for fill at this time     Tracy Robles, DOB: 1965-10-22  Phone: There are no phone numbers on file.      All above HIPAA information was verified with patient.     Was a Nurse, learning disability used for this call? No    Completed refill call assessment today to schedule patient's medication shipment from the [facility]  [phone]).  All relevant notes have been reviewed.     Specialty medication(s) and dose(s) confirmed: Regimen is correct and unchanged.   Changes to medications: Tracy Robles reports starting the following medications: meloxicam   Changes to insurance: No  New side effects reported not previously addressed with a pharmacist or physician: None reported  Questions for the pharmacist: No    Confirmed patient received a Conservation officer, historic buildings and a Surveyor, mining with first shipment. The patient will receive a drug information handout for each medication shipped and additional FDA Medication Guides as required.       DISEASE/MEDICATION-SPECIFIC INFORMATION        For patients on injectable medications: Patient currently has 0 doses left.  Next injection is scheduled for 1/25. Patient states she takes on the 28th of every month to remember    SPECIALTY MEDICATION ADHERENCE     Medication Adherence    Patient reported X missed doses in the last month: 0  Specialty Medication: secukinumab (COSENTYX PEN, 2 PENS,) 150 mg/mL PnIj injection  Patient is on additional specialty medications: No  Informant: patient                Were doses missed due to medication being on hold? No    cosentyx 150 mg/ml: 0 days of medicine on hand       REFERRAL TO PHARMACIST     Referral to the pharmacist: Not needed      [facility]     Shipping address confirmed in Epic.       Delivery Scheduled: Yes, Expected medication delivery date: 1/24.     Medication will be delivered via Same Day Courier to the prescription address in Epic WAM.    [facility]les [facility]

## 2023-05-07 ENCOUNTER — Ambulatory Visit: Admit: 2023-05-07 | Discharge: 2023-05-08 | Payer: BLUE CROSS/BLUE SHIELD

## 2023-05-07 DIAGNOSIS — Z6841 Body Mass Index (BMI) 40.0 and over, adult: Principal | ICD-10-CM

## 2023-05-07 DIAGNOSIS — E66813 Class 3 severe obesity with body mass index (BMI) of 50.0 to 59.9 in adult, unspecified obesity type, unspecified whether serious comorbidity present (CMS-HCC): Principal | ICD-10-CM

## 2023-05-07 DIAGNOSIS — I10 Essential (primary) hypertension: Principal | ICD-10-CM

## 2023-05-07 NOTE — Unmapped (Signed)
Tracy Robles stops by the [facility] for a weigh in and BP check. She is working with her provider on both and is hopeful that new medication regimen will assist both.     Today's BP is 142/86 and her weight is 286lb.

## 2023-05-08 MED FILL — COSENTYX PEN 300 MG/2 PENS (150 MG/ML) SUBCUTANEOUS: SUBCUTANEOUS | 28 days supply | Qty: 2 | Fill #2

## 2023-05-11 NOTE — Unmapped (Signed)
INTERIM HISTORY:  [name] returns today for evaluation of her right wrist.  I have previously treated her for a left upper extremity symptoms.  She had left fourth dorsal compartment tendinitis which was treated with an injection by Dr. Enis Slipper in March 2023.  Her left side is doing well.  Unfortunately she has developed pain at the dorsal radial aspect of the right wrist.  She denies any trauma.  She has tried wearing multiple different splints without improvement.    PHYSICAL EXAMINATION:  EXTREMITIES:    - Examination of the right upper extremity shows she is neuovascularly intact with normal sensation and strength throughout.  She has full and painless range of motion of her fingers with no instability.  Skin is intact.  Fingers are warm and well perfused with brisk capillary refill and she has a palpable radial pulse.  There is tenderness to palpation about the thumb first dorsal compartment.  Nontender at the thumb [facility] joint.  She has a positive Finkelstein's.  Nontender at the dorsal wrist.  Nontender at the thumb MP joint both dorsally and volarly.    IMAGING:  Images were personally reviewed and interpreted by Dr. Jarold Motto.    Xrays of right wrist taken 02/25/2023 show mild thumb MP arthritis.  No CMC arthritis or radiocarpal arthritis appreciated.    ASSESSMENT:   Right first dorsal compartment tendinitis.  EMG-proven mild left carpal tunnel syndrome   Left fourth dorsal compartment tendonitis doing well status post injection 07/01/2021.    PLAN:  [name] and I discussed her symptoms and treatment options.  Her pain seems to be localized over the first dorsal compartment.  I think that she likely has first dorsal compartment tendinitis.  We discussed options including continued observation and splinting, injection, and surgical release.  She and I have agreed that an injection would be an appropriate neck step.  The risks and benefits of injection were discussed with the patient including hypopigmentation.  Injection to the first dorsal compartment was completed today in clinic.  She will return to see me on an as-needed basis.    Injection: After informed consent was obtained and a timeout was performed, the patient's right first dorsal compartment was injected under sterile conditions using 1 mL of 40 mg/mL of Kenalog and 0.5 mL of 2% lidocaine without epinephrine.  The patient tolerated the injection well.

## 2023-05-11 NOTE — Unmapped (Signed)
Assessment and Plan:     Encounter for completion of form with patient    Primary hypertension      diagnoses above reviewed with patient  Continue current medication management  Recommend supportive treatment with lifestyle modifications with diet/exercise      Barriers to recommended plan: None identified      I personally spent 20 minutes face-to-face and non-face-to-face in the care of this patient, which includes all pre, intra, and post visit time (reviewing, evaluating and discussing pertinent records for patient's care) on the date of service.     No follow-ups on file.    Subjective:     HPI: Tracy Robles is a 58 y.o. female here for completion of FMLA paperwork. Patient brings in paperwork allowing her to take leave from work in order to care for her mother.     HPI       ROS:   Review of Systems     Review of systems negative unless otherwise noted as per HPI.      The following portions of the patient's history were reviewed and updated as appropriate: allergies, current medications, past family history, past medical history, past social history, past surgical history and problem list.     Objective:     Vitals:    05/14/23 1105   BP: 146/85   Pulse: 75   Temp: 36.7 ??C (98.1 ??F)     Body mass index is 51.24 kg/m??.    Physical Exam  Vitals and nursing note reviewed.   Constitutional:       General: She is not in acute distress.     Appearance: Normal appearance. She is not ill-appearing or toxic-appearing.   Cardiovascular:      Rate and Rhythm: Normal rate and regular rhythm.      Heart sounds: Normal heart sounds.   Pulmonary:      Effort: Pulmonary effort is normal. No respiratory distress.      Breath sounds: Normal breath sounds.   Musculoskeletal:      Cervical back: Neck supple.   Neurological:      Mental Status: She is alert.          Allergies:     Lisinopril, Latex, and Penicillins    [facility]:     Medication adherence and barriers to the treatment plan have been addressed. Opportunities to optimize healthy behaviors have been discussed. Patient / caregiver voiced understanding.      I attest that I, Zuling F Quade, personally documented this note while acting as scribe for Payton Mccallum, MD.      Ranee Gosselin, Scribe.  05/14/2023     The documentation recorded by the scribe accurately reflects the service I personally performed and the decisions made by me.    Payton Mccallum, MD

## 2023-05-12 ENCOUNTER — Ambulatory Visit
Admit: 2023-05-12 | Discharge: 2023-05-13 | Payer: BLUE CROSS/BLUE SHIELD | Attending: Orthopaedic Surgery | Primary: Orthopaedic Surgery

## 2023-05-12 MED ADMIN — triamcinolone acetonide (KENALOG-40) injection 40 mg: 40 mg | INTRA_ARTICULAR | @ 18:00:00 | Stop: 2023-05-12

## 2023-05-12 NOTE — Unmapped (Signed)
You received a corticosteroid injection to reduce pain and inflammation.  Please note that it can take up to 2 weeks for this injection to fully work.  While many people will feel relief sooner, please be patient.    The injection contained a corticosteroid and a numbing agent.  The numbing agent can last for 1-6 hours.  You may have increased pain until the steroid has a chance to work, usually lasting no more than 1-2 days. You may take over the counter pain medication (Tylenol/ibuprofen/etc) if not otherwise contra-indicated. You may also ice the area.    What are some of the possible side effects of a steroid injection?    Common side effects:  temporarily elevated blood sugar (in diabetic patients) that can last a few days   flushing of the skin, especially the face  temporary rise in blood pressure  discoloration or atrophy of the skin at the injection site    Call your doctor at once if you have:  persistent worsening pain or swelling, fever;  blurred vision, tunnel vision, eye pain, or seeing halos around lights;  fast or slow heartbeats;  increased blood pressure that is associated with severe headache, blurred vision, pounding in your neck or ears, anxiety, nosebleed;  headaches, ringing in your ears, dizziness, nausea, vision problems, pain behind your eyes    This is not a complete list of side effects and others may occur. Call your doctor for medical advice about side effects.     What other drugs may be affected after the injection?  Many drugs can interact with steroids. Not all possible interactions are listed here. Tell your doctor about all your current medicines and any you start or stop using, especially:  an antibiotic or antifungal medication;  birth control pills or hormone replacement therapy;  a blood thinner (warfarin, Coumadin, and others);  a diuretic or water pill;  insulin or oral diabetes medicine;  medicine to treat tuberculosis;  a nonsteroidal anti-inflammatory drug or NSAID (aspirin, ibuprofen, naproxen, diclofenac, indomethacin, Advil, Aleve, Celebrex, and many others); or  seizure medication.      You can resume your normal daily activities, but consider resting the injected area for the next few days.

## 2023-05-14 ENCOUNTER — Ambulatory Visit: Admit: 2023-05-14 | Discharge: 2023-05-15 | Payer: BLUE CROSS/BLUE SHIELD

## 2023-05-14 DIAGNOSIS — Z0289 Encounter for other administrative examinations: Principal | ICD-10-CM

## 2023-05-14 NOTE — Unmapped (Signed)
[facility] Refill Coordination Note    Specialty Lite Medication(s) to be Shipped:   Mounjaro    Other medication(s) to be shipped: No additional medications requested for fill at this time     Tracy Robles, DOB: 04/02/1966  Phone: There are no phone numbers on file.      All above HIPAA information was verified with patient.     Was a Nurse, learning disability used for this call? No    Changes to medications: Tequia reports no changes at this time.  Changes to insurance: No      REFERRAL TO PHARMACIST     Referral to the pharmacist: Not needed      [facility]     Shipping address confirmed in Epic.     Delivery Scheduled: Yes, Expected medication delivery date: 05/18/23.     Medication will be delivered via Same Day Courier to the prescription address in Epic WAM.    Kerby Less   [facility] Specialty Technician

## 2023-05-20 NOTE — Unmapped (Signed)
[facility] [place]  OUTPATIENT PHYSICAL THERAPY  05/21/2023  Note Type: Treatment Note       Patient Name: Tracy Robles  Date of Birth:[birth year]  Diagnosis:   Encounter Diagnoses   Name Primary?    Chronic bilateral low back pain with left-sided sciatica Yes    Spondylolisthesis at L4-L5 level        Referring Provider: Birdie Sons    Date of Onset of Impairment: No date available  Date PT Care Plan Established or Reviewed: 03/26/2023  Date PT Treatment Started: 03/26/2023     Plan of Care Effective Date: (1/14 auth until 06/30/23)  Session Number:  3     ASSESSMENT & PLAN   Assessment  Assessment details:    Today: Tracy Robles is a 58 y.o. female presents with bilateral low back dysfunction which is limiting her ability to perform the functional activities listed below. Patient returning after ~1 month break from physical therapy. Re-assessment of , patient scoring 62% of age/gender norm. Continues to be limited by muscle fatigue, requiring frequent sitting rest breaks. Encouraged patient to incorporate walking into ADLs such as parking farther away from the store. Recommend 1x/week for continued progression of lower extremity strength/endurance. Patient reports 1x/every other week more feasible, scheduled 3 additional sessions. 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       Evaluation: Tracy Robles is a 58 y.o. female presents with bilateral low back dysfunction which is limiting her ability to perform the functional activities listed below.  Based on patient's presentation in clinic today, signs and symptoms appear to be consistent with low back pain with movement coordination impairments with general deconditioning.  The patient presents with associated impairments outlined below impacting her current functional status as outlined below.  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  I anticipate the patient will require the specified duration and frequency of care to achieve her goals for rehab to return this more desirable level of function.          Impairments: pain, decreased strength, decreased range of motion, decreased knowledge of self-care and impaired ADLs    Other impairment: limited understanding of the rehabilitation process, exercise progression, and self-care    Personal Factors/Comorbidities: 1-2    Specific Comorbidities: HTN, DM-II, Obesity      Clinical Presentation: stable    Clinical Decision Making: low    Prognosis: excellent prognosis    Positive Prognosis Rationale: motivated for treatment and strength.  Negative Prognosis Rationale: caregiver/family support, Pain Status and chronicity of condition.      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  2) Pt will improve to >80 related norms to demonstrate improved LE endurance and gait speed.   3) Pt will demonstrate full, pain free ROM of lumbar extension to allow for improved walking tolerance with rotational movement.  4) Pt will be able to stand for >45 min to demonstrate improved activity tolerance.  Plan    Therapy options: will be seen for skilled physical therapy services    Planned therapy interventions: 97110-Therapeutic Exercises, 97112-Neuromuscular Re-education, 97140-Manual Therapy, 97164-Re-evaluation, 97530-Therapeutic Activities, 97535-Self-Care/Home Training and 97116-Gait Training    Other planned therapy interventions: Therapeutic Activity, Self-Care/Home Management Training, and all PT modalities as indicated      Frequency: 1x week (tapering frequency towards independence)    Duration in weeks: 10    Education provided to: patient.    Education provided: Treatment options and plan and HEP    Education results: needs reinforcement and needs further instruction.        Total Session Time: equates to total treatment time unless otherwise noted.          SUBJECTIVE         History of Present Condition     History of Present Condition/Chief Complaint:  M54.50,G89.29 (ICD-10-CM) - Chronic bilateral low back pain without sciatica  M43.16 (ICD-10-CM) - Spondylolisthesis at L4-L5 level    Long history low back pain.  Right side worse than left.  Shooting pain down left leg over thigh occurring intermittent.  Primary pain right-sided axial low back pain.  Does note intermittent buttock pain.  Pain consistently worse with sitting for extended periods.  Denies any current radicular pain/paresthesia.     Functional limitations include: pain with sitting for prolonged periods    Subjective:  Today: Doing the exercises, trying to do them in between of taking care of family. 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 I have some pain today from being at the Dr office (have to lay on back, etc)    Evaluation: My back is shifted forward and pops when I walk that is painful. That is the only time it hurts and its as soon as I start. If I stand too (30-40) long it starts to hurt or walk too long (2-3 hr), sitting bothers hips and back that varies in duration. Stretching backwards help and sideways.  The pain travels down to the lateral side of L leg. My R side was worst but now the L bothers.   I take of my 74 y.o mom. She takes care of herself but I have to watch her.    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)  Pain:     Current pain rating:  0    At best pain rating:  0    At worst pain rating:  5  Location:  Lower part of back is sharp, hip is vibrating and sharp    Quality:  Aching, tingling, sharp and stabbing    Relieving factors:  Rest and medications (Stretching)    Aggravating factors:  Standing, walking, stairs, sitting and lying  Precautions/Equipment   Precautions:  None    Current Braces/Orthoses:  None    Equipment Currently Used:  None  Prior Functional Status     No physical limitations    Current Functional Status:    disturbed sleep, limited lifting, limited standing tolerance, limited walking tolerance, limited sitting tolerance, limited bending, 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    Grade 1 anterolisthesis of L3 on L4. Mild left lateral listhesis of L3 on L4.    Mild to moderate multilevel degenerative changes. Findings are slightly progressed since  11/18/2018      Treatments:     Previous treatment:  Physical therapy    Current treatment: physical therapy    Patient Goals:     Patient/Family goals for therapy:  Increased strength, improved standing tolerance, improved sleep, improved sitting tolerance, improved ambulation and decreased pain    Other patient goals:  Increase driving time, cooking, standing 60+        OBJECTIVE     Outcome Measure: Revised Oswestry: Oswestry Score: 21 / 50 or 42 %     Posture/Observation:   Standing: unremarkable  Gait: non-antalgic  BIL hip drop L>R    Lumbar Spine ROM  Motion No ROM Loss  (0%) Min ROM Loss  (1-33%) Mod ROM Loss  (34-65%) Major ROM Loss  (66-100%) Symptoms    Flexion  X   Pain lower lumbar   Extension   x  Pain lower lumbar   Sidebend Right  X   Pain with overpressure   Side bend Left  X      Rotation R  X   No pain; OP not tested   Rotation L   X   No pain; OP not tested       Function:  30s STS (mat table 17 inches, no UE assist):   10 reps (rocking noted)  9 reps (without rocking, some LBP with rise and lower) with increased forward flexion  2 minute walk test: 366 ft  With no assistive device, 66% of norm 1.5 min in with pain (eval)    05/21/23  : 341 ft, mild LBP pain at 1:20 minutes    2 Minute Walk Test (2 MWT) Normative Data     Age (yr) Female   Mean Distance (ft)   20-29 636   30-39 593   40-49 590   50-59 554   Measure of self-paced walking ability and functional capacity  Bohannon RW. Normative reference values for the two-minute walk test derived by meta-analysis. J Phys Ther Sci. 2017 Dec; 29(12): 2224-2227.       TREATMENT RENDERED     Interpreter Use: Not applicable    Therapeutic Exercise:  40 Minutes   Performed with direct PT demonstration, instruction, supervision, and guidance.   - Education on condition, prognosis, and PT POC  - HEP review as below     : 341 ft, mild LBP pain at 1:20 minutes  Upright row: 3 x 6-10 reps  Paloff press: red and blue band 2 x 10 reps  Standing marches: 2 x 30 seconds   *medium challenge, 2nd set addition of contralteral knee touch  - Standing hip abduction: latex free band  - STS: hip abduction band with discomfort  due to band rolling above knees, 2 x 10 reps   *did x 10 reps without hip abduction band with increased comfort, arms crossed      Next Visit Plan: core stabilization exercises not in supine or hooklying (plank, pallof), glute strengthening, quad strengthening, generals strength/conditioning      Total Treatment Time: 40 Minutes        Therapeutic Interventions Charges  $$ Therapeutic Exercise [mins]: 40                 I attest that I have reviewed the above information.  Signed: Chauncey Reading, PT, DPT  05/21/2023 12:21 PM        I reviewed the no-show/attendance policy with the patient and caregiver(s). The patient is aware that they must  call to cancel appointments more than 24 hours in advance. They are also aware that if they late cancel or no-show three times, we reserve the right to cancel their remaining appointments. This policy is in place to allow [place] to best serve the needs of our caseload.    If patient returns to clinic with variance in plan of care, then it may be attributable to one or more of the following factors: preferred clinician availability, appointment time request availability, therapy pool appointment availability, major holiday with clinic closure, caregiver availability, patient transportation, conflicting medical appointment, inclement weather, and/or patient illness.    If patient does not return for follow up visit(s) related to this episode of care, this note will serve as their discharge note from Physical Therapy.    This care was provided during physical therapy residency mentored training.  I was present throughout and participated in the examination, assessment, treatment and plan of care.  Chauncey Reading, PT, PT  March 26, 2023 2:54 PM

## 2023-05-21 ENCOUNTER — Ambulatory Visit
Admit: 2023-05-21 | Discharge: 2023-05-22 | Payer: BLUE CROSS/BLUE SHIELD | Attending: Student in an Organized Health Care Education/Training Program | Primary: Student in an Organized Health Care Education/Training Program

## 2023-05-21 ENCOUNTER — Ambulatory Visit: Admit: 2023-05-21 | Discharge: 2023-05-24 | Payer: BLUE CROSS/BLUE SHIELD

## 2023-05-21 DIAGNOSIS — N951 Menopausal and female climacteric states: Principal | ICD-10-CM

## 2023-05-21 DIAGNOSIS — Z01419 Encounter for gynecological examination (general) (routine) without abnormal findings: Principal | ICD-10-CM

## 2023-05-21 MED FILL — MOUNJARO 10 MG/0.5 ML SUBCUTANEOUS PEN INJECTOR: SUBCUTANEOUS | 28 days supply | Qty: 2 | Fill #3

## 2023-05-21 NOTE — Unmapped (Signed)
Outpatient Gynecology Note: Annual GYN Exam    ASSESSMENT AND PLAN     Problem List Items Addressed This Visit       Visit for gynecologic examination - Primary    Relevant Orders    Pap Test     Other Visit Diagnoses         Hot flashes due to menopause              - Normal exam   - Pap collected  - Declined STI screening  -  Mammogram completed 04/24/2023  - HARK negative  - Recommended screening exams and work-up completed today with collection of pap smear.   - Counseled on options related to symptom management for menopausal hot-flashes. Patient aware of non-hormonal and hormonal options for current symptoms; however, patient is able to manage current symptoms at this time with awareness of possible referral to menopause clinic or return to [facility] for further discussion of management options as needed.    Follow-up as indicated based on results of pap smear. Recommend at least yearly annual physical with PCP, GYN annual exams as desired by patient.    Donnel Saxon, MD    SUBJECTIVE     This 58 y.o. G2P0 is a new [facility] patient who presents for annual gyn exam. The patient notes history of difficult pelvic exams in the past given pelvic/hip pain in setting of known sciatica. The patient declines any acute concerns today.    She notes an obstetric history including miscarriage and left tubal ectopic pregnancy in the early 2000s. She also has an history of endometriosis. She had normal periods aside from 1 period that started and did not stop for 4-5 months. She underwent an uncomplicated D&C in the setting of that persistent bleeding. She has not experienced any bleeding in 10+ years. She denies any family history of uterine, ovarian, colon or breast cancer. She did not receive any HPV Vaccines. She declines any abnormal vaginal discharge, dryness or bleeding. She notes intermittent hot flashes at night while sleeping but is not too bothered by them at this time.    GYN HISTORY:   Age at time of menopause: 21  Hx of HRT use: no  Last Pap:  2019    Abnormal Pap hx: none  STI history: negative  Last mammogram:  2025, BI-RADS 1    OB History   Gravida Para Term Preterm AB Living     0      SAB IAB Ectopic Molar Multiple Live Births               Past Medical History:   Diagnosis Date    Allergic Don't know    Penicillin    Arthritis 2011    Lower back    BMI 60.0-69.9, adult (CMS-HCC) 05/29/2021    Diabetes mellitus (CMS-HCC)     Eczema     High blood pressure     Psoriasis        No past surgical history on file.    Family History   Problem Relation Age of Onset    Diabetes Father     Hypertension Father     Kidney disease Father     Clotting disorder Father         Blood clot    Asthma Father     Diabetes Sister     Miscarriages / Stillbirths Sister     Hypertension Mother     Arthritis Mother  Breast cancer Maternal Aunt     Stroke Maternal Grandmother     Miscarriages / Stillbirths Maternal Grandmother     Melanoma Neg Hx     Basal cell carcinoma Neg Hx     Squamous cell carcinoma Neg Hx     Ovarian cancer Neg Hx        Social History     Tobacco Use    Smoking status: Never     Passive exposure: Current    Smokeless tobacco: Never   Vaping Use    Vaping status: Never Used   Substance Use Topics    Alcohol use: Yes     Alcohol/week: 1.0 standard drink of alcohol     Types: 1 Cans of beer per week     Comment: Only on Saturday    Drug use: Never       Current Outpatient Medications   Medication Sig Dispense Refill    amlodipine (NORVASC) 10 MG tablet Take 1 tablet (10 mg total) by mouth daily. 90 tablet 3    biotin 1 mg cap Take by mouth.      blood sugar diagnostic (GLUCOSE BLOOD) Strp Check blood sugar as directed once a day and for symptoms of high or low blood sugar. 100 strip 3    blood-glucose meter kit Use as instructed 1 each 0    cetirizine (ZYRTEC) 5 MG tablet Take 1 tablet (5 mg total) by mouth once as needed.      clobetasoL (TEMOVATE) 0.05 % external solution Apply topically to scalp twice daily. 50 mL 6    clobetasol (TEMOVATE) 0.05 % ointment Apply topically to rash on body twice daily. 60 g 6    empagliflozin (JARDIANCE) 10 mg tablet Take 1 tablet (10 mg total) by mouth daily. 90 tablet 3    gabapentin (NEURONTIN) 300 MG capsule Take 2 capsules (600 mg total) by mouth two (2) times a day. (Patient not taking: Reported on 02/25/2023) 360 capsule 3    hydroCHLOROthiazide (HYDRODIURIL) 25 MG tablet Take 1 tablet (25 mg total) by mouth daily. 90 tablet 1    ketoconazole (NIZORAL) 2 % cream Apply twice daily to itchy red area until the rash is gone 30 g 11    loratadine (CLARITIN) 10 mg tablet Take 1 tablet (10 mg total) by mouth daily as needed for allergies.      losartan (COZAAR) 100 MG tablet Take 1 tablet (100 mg total) by mouth daily. 90 tablet 3    meloxicam (MOBIC) 15 MG tablet Take 1 tablet (15 mg total) by mouth daily as needed for pain. 30 tablet 1    multivit with min-folic acid (MULTIVITAMIN GUMMIES) 200 mcg Chew Chew.      ONETOUCH DELICA PLUS LANCET 33 gauge Misc CHECK BLOOD SUGAR AS DIRECTED ONCE A DAY AND FOR SYMPTOMS OF HIGH OR LOW BLOOD SUGAR      secukinumab (COSENTYX PEN, 2 PENS,) 150 mg/mL PnIj injection Inject the contents of 2 pens (300 mg total) under the skin every twenty-eight (28) days. 2 mL 11    simvastatin (ZOCOR) 20 MG tablet Take 1 tablet (20 mg total) by mouth nightly. 90 tablet 3    spironolactone (ALDACTONE) 25 MG tablet Take 1 tablet (25 mg total) by mouth daily. 90 tablet 3    tirzepatide (MOUNJARO) 10 mg/0.5 mL PnIj Inject the contents of 1 pen (10 mg) under the skin every seven (7) days. 2 mL 3    topiramate (TOPAMAX) 25 MG tablet  Take 2 tablets (50 mg total) by mouth nightly. 120 tablet 2    triamcinolone (KENALOG) 0.1 % cream Apply topically to active, raised areas twice daily as needed. 80 g 3     No current facility-administered medications for this visit.       Allergies   Allergen Reactions    Lisinopril Cough    Latex      Other reaction(s): UNKNOWN    Penicillins Other reaction(s): UNSPECIFIED       REVIEW OF SYSTEMS: See HPI; remaining balance of 10 systems are negative/non-contributory.    OBJECTIVE     BP 147/79 (BP Site: L Arm, BP Cuff Size: Medium)  - Pulse 86   General: well-appearing and in NAD  Lymph:  no axillary or inguinal lymphadenopathy  Neck:  thyroid smooth, non-enlarged, without nodules  Breasts:  no nipple discharge, no suspicious masses   Abdomen:  soft, NT, ND, no organomegaly  Pelvic:  normal external genitalia, BUS negative, vagina hypoestrogenated, vagina and cervix are without gross lesions, cervix anterior, unable to fully appreciate uterus, no CMT, no adnexal tenderness or palpable masses  Psych: pleasant and interactive, answers questions appropriately    A chaperone was present for any sensitive portions of the exam (if performed) including but not limited to, breast and pelvic examination.   If performed, consent was obtained from patient prior to the sensitive examination.  If medical students were involved, explicit additional consent was obtained.     LABS     Results for orders placed or performed in visit on 04/10/23   Colorectal Cancer DNA + FIT   Result Value Ref Range    Colorectal Cancer DNA + FIT Negative Negative   Albumin/creatinine urine ratio   Result Value Ref Range    Creat U 160.9 Undefined mg/dL    Albumin Quantitative, Urine <0.3 Undefined mg/dL    Albumin/Creatinine Ratio     Comprehensive Metabolic Panel   Result Value Ref Range    Sodium 147 (H) 135 - 145 mmol/L    Potassium 4.7 3.4 - 4.8 mmol/L    Chloride 109 (H) 98 - 107 mmol/L    CO2 27.3 20.0 - 31.0 mmol/L    Anion Gap 11 5 - 14 mmol/L    BUN 17 9 - 23 mg/dL    Creatinine 1.61 0.96 - 1.02 mg/dL    BUN/Creatinine Ratio 19     eGFR CKD-EPI (2021) Female 77 >=60 mL/min/1.58m2    Glucose 100 (H) 70 - 99 mg/dL    Calcium 9.7 8.7 - 04.5 mg/dL    Albumin 3.7 3.4 - 5.0 g/dL    Total Protein 7.1 5.7 - 8.2 g/dL    Total Bilirubin 0.6 0.3 - 1.2 mg/dL    AST 15 <=40 U/L    ALT 13 10 - 49 U/L    Alkaline Phosphatase 63 46 - 116 U/L   Lipid Panel   Result Value Ref Range    Triglycerides 63 0 - 150 mg/dL    Cholesterol 981 <=191 mg/dL    HDL 50 40 - 60 mg/dL    LDL Calculated 79 40 - 99 mg/dL    VLDL Cholesterol Cal 12.6 11 - 40 mg/dL    Chol/HDL Ratio 2.8 1.0 - 4.5    Non-HDL Cholesterol 92 70 - 130 mg/dL    FASTING Yes    TSH   Result Value Ref Range    TSH 1.875 0.550 - 4.780 uIU/mL

## 2023-05-21 NOTE — Unmapped (Signed)
This pharmacist was notified by a technician that this patient has started a new medication MELOXICAM. and wearing off of doses . I have reviewed the patient's medical record and have determined that there are no concerns for drug interactions with their new medication. The patients medication list has been updated, if appropriate, and no further action is required. Clinical call set for next month to assess efficacy.    Latoiya Maradiaga A. Katrinka Blazing, PharmD, BCPS - Clinical Pharmacist   [facility]    [address], [place], [place] [address]  t [phone], opt 4 then 2 - f [phone]

## 2023-05-21 NOTE — Unmapped (Signed)
I reviewed with the resident the medical history and the resident???s findings on physical examination.  I discussed with the resident the patient???s diagnosis and concur with the treatment plan as documented in the resident note. Oswaldo Conroy, MD

## 2023-05-29 DIAGNOSIS — L409 Psoriasis, unspecified: Principal | ICD-10-CM

## 2023-05-29 DIAGNOSIS — E1165 Type 2 diabetes mellitus with hyperglycemia: Principal | ICD-10-CM

## 2023-05-29 MED ORDER — MOUNJARO 10 MG/0.5 ML SUBCUTANEOUS PEN INJECTOR
SUBCUTANEOUS | 3 refills | 28.00 days
Start: 2023-05-29 — End: ?

## 2023-05-29 MED ORDER — TRIAMCINOLONE ACETONIDE 0.1 % TOPICAL CREAM
3 refills | 0.00 days
Start: 2023-05-29 — End: ?

## 2023-05-29 NOTE — Unmapped (Signed)
 Appointment required

## 2023-05-29 NOTE — Unmapped (Signed)
 [facility] Clinical Assessment & Refill Coordination Note    Tracy Robles, [place]: 1966-02-02  Phone: There are no phone numbers on file.    All above HIPAA information was verified with patient.     Was a Nurse, learning disability used for this call? No    Specialty Medication(s):   Inflammatory Disorders: Cosentyx and Specialty Lite: Mounjaro     Current Outpatient Medications   Medication Sig Dispense Refill    amlodipine (NORVASC) 10 MG tablet Take 1 tablet (10 mg total) by mouth daily. 90 tablet 3    biotin 1 mg cap Take by mouth.      blood sugar diagnostic (GLUCOSE BLOOD) Strp Check blood sugar as directed once a day and for symptoms of high or low blood sugar. 100 strip 3    blood-glucose meter kit Use as instructed 1 each 0    cetirizine (ZYRTEC) 5 MG tablet Take 1 tablet (5 mg total) by mouth once as needed.      clobetasoL (TEMOVATE) 0.05 % external solution Apply topically to scalp twice daily. 50 mL 6    clobetasol (TEMOVATE) 0.05 % ointment Apply topically to rash on body twice daily. 60 g 6    empagliflozin (JARDIANCE) 10 mg tablet Take 1 tablet (10 mg total) by mouth daily. 90 tablet 3    gabapentin (NEURONTIN) 300 MG capsule Take 2 capsules (600 mg total) by mouth two (2) times a day. (Patient not taking: Reported on 02/25/2023) 360 capsule 3    hydroCHLOROthiazide (HYDRODIURIL) 25 MG tablet Take 1 tablet (25 mg total) by mouth daily. 90 tablet 1    ketoconazole (NIZORAL) 2 % cream Apply twice daily to itchy red area until the rash is gone 30 g 11    loratadine (CLARITIN) 10 mg tablet Take 1 tablet (10 mg total) by mouth daily as needed for allergies.      losartan (COZAAR) 100 MG tablet Take 1 tablet (100 mg total) by mouth daily. 90 tablet 3    meloxicam (MOBIC) 15 MG tablet Take 1 tablet (15 mg total) by mouth daily as needed for pain. 30 tablet 1    multivit with min-folic acid (MULTIVITAMIN GUMMIES) 200 mcg Chew Chew.      ONETOUCH DELICA PLUS LANCET 33 gauge Misc CHECK BLOOD SUGAR AS DIRECTED ONCE A DAY AND FOR SYMPTOMS OF HIGH OR LOW BLOOD SUGAR      secukinumab (COSENTYX PEN, 2 PENS,) 150 mg/mL PnIj injection Inject the contents of 2 pens (300 mg total) under the skin every twenty-eight (28) days. 2 mL 11    simvastatin (ZOCOR) 20 MG tablet Take 1 tablet (20 mg total) by mouth nightly. 90 tablet 3    spironolactone (ALDACTONE) 25 MG tablet Take 1 tablet (25 mg total) by mouth daily. 90 tablet 3    tirzepatide (MOUNJARO) 10 mg/0.5 mL PnIj Inject the contents of 1 pen (10 mg) under the skin every seven (7) days. 2 mL 3    topiramate (TOPAMAX) 25 MG tablet Take 2 tablets (50 mg total) by mouth nightly. 120 tablet 2    triamcinolone (KENALOG) 0.1 % cream Apply topically to active, raised areas twice daily as needed. 80 g 3     No current facility-administered medications for this visit.        Changes to medications: Nance reports no changes at this time.    Medication list has been reviewed and updated in Epic: Yes    Allergies  Allergen Reactions    Lisinopril Cough    Latex      Other reaction(s): UNKNOWN    Penicillins      Other reaction(s): UNSPECIFIED       Changes to allergies: No    Allergies have been reviewed and updated in Epic: Yes    SPECIALTY MEDICATION ADHERENCE     Cosentyx 150 mg/ml: 0 doses of medicine on hand   Mounjaro 10  mg/0.61mL : 0 doses of medicine on hand   Medication Adherence    Patient reported X missed doses in the last month: 0  Specialty Medication: Cosentyx 150mg /mL  Patient is on additional specialty medications: Yes  Additional Specialty Medications: Mounjaro   Patient Reported Additional Medication X Missed Doses in the Last Month: 0  Informant: patient          Specialty medication(s) dose(s) confirmed: Regimen is correct and unchanged.     Are there any concerns with adherence? No    Adherence counseling provided? Not needed    CLINICAL MANAGEMENT AND INTERVENTION      Clinical Benefit Assessment:    Do you feel the medicine is effective or helping your condition? Yes    Clinical Benefit counseling provided? Not needed    Adverse Effects Assessment:    Are you experiencing any side effects? No    Are you experiencing difficulty administering your medicine? No    Quality of Life Assessment:    Quality of Life    Rheumatology  Oncology  Dermatology  1. What impact has your specialty medication had on the symptoms of your skin condition (i.e. itchiness, soreness, stinging)?: Some  2. What impact has your specialty medication had on your comfort level with your skin?: Some  Cystic Fibrosis          How many days over the past month did your Psoriasis  keep you from your normal activities? For example, brushing your teeth or getting up in the morning. 0    Have you discussed this with your provider? Not needed    Acute Infection Status:    Acute infections noted within Epic:  No active infections  Patient reported infection: None    Therapy Appropriateness:    Is therapy appropriate based on current medication list, adverse reactions, adherence, clinical benefit and progress toward achieving therapeutic goals? Yes, therapy is appropriate and should be continued     DISEASE/MEDICATION-SPECIFIC INFORMATION      For patients on injectable medications: Patient currently has 0 doses left.  Next injection is scheduled for 2/28.    Chronic Inflammatory Diseases: Have you experienced any flares in the last month? No  Has this been reported to your provider? No    PATIENT SPECIFIC NEEDS     Does the patient have any physical, cognitive, or cultural barriers? No    Is the patient high risk? No    Did the patient require a clinical intervention? No    Does the patient require physician intervention or other additional services (i.e., nutrition, smoking cessation, social work)? No    Does the patient have an additional or emergency contact listed in their chart? Yes    SOCIAL DETERMINANTS OF HEALTH     At the [facility], we have learned that life circumstances - like trouble affording food, housing, utilities, or transportation can affect the health of many of our patients.   That is why we wanted to ask: are you currently experiencing any life circumstances that are  negatively impacting your health and/or quality of life? No    Social Drivers of Health     Food Insecurity: No Food Insecurity (04/04/2023)    Hunger Vital Sign     Worried About Running Out of Food in the Last Year: Never true     Ran Out of Food in the Last Year: Never true   Internet Connectivity: Internet connectivity concern unknown (04/04/2023)    Internet Connectivity     Do you have access to internet services: Yes     How do you connect to the internet: Not on file     Is your internet connection strong enough for you to watch video on your device without major problems?: Not on file     Do you have enough data to get through the month?: Not on file     Does at least one of the devices have a camera that you can use for video chat?: Not on file   Housing/Utilities: Low Risk  (04/04/2023)    Housing/Utilities     Within the past 12 months, have you ever stayed: outside, in a car, in a tent, in an overnight shelter, or temporarily in someone else's home (i.e. couch-surfing)?: No     Are you worried about losing your housing?: No     Within the past 12 months, have you been unable to get utilities (heat, electricity) when it was really needed?: No   Tobacco Use: Medium Risk (05/14/2023)    Patient History     Smoking Tobacco Use: Never     Smokeless Tobacco Use: Never     Passive Exposure: Current   Transportation Needs: No Transportation Needs (04/04/2023)    PRAPARE - Transportation     Lack of Transportation (Medical): No     Lack of Transportation (Non-Medical): No   Alcohol Use: Not on file   Interpersonal Safety: Not At Risk (04/10/2023)    Interpersonal Safety     Unsafe Where You Currently Live: No     Physically Hurt by Anyone: No     Abused by Anyone: No   Physical Activity: Not on file   Intimate Partner Violence: Not on file   Stress: Not on file   Substance Use: Not on file (02/18/2023)   Social Connections: Not on file   Financial Resource Strain: Low Risk  (04/04/2023)    Overall Financial Resource Strain (CARDIA)     Difficulty of Paying Living Expenses: Not very hard   Depression: Not at risk (08/05/2022)    PHQ-2     PHQ-2 Score: 0   Health Literacy: Low Risk  (12/12/2020)    Health Literacy     : Never       Would you be willing to receive help with any of the needs that you have identified today? No       SHIPPING     Specialty Medication(s) to be Shipped:   Inflammatory Disorders: Cosentyx and Specialty Lite: Mounjaro    Other medication(s) to be shipped:  triamcinolone     Changes to insurance: No    Delivery Scheduled: Yes, Expected medication delivery date: 2/19.  However, Rx request for refills was sent to the provider as there are none remaining. (For Mounjaro and triamcinolone)    Medication will be delivered via Same Day Courier to the confirmed prescription address in [facility].    The patient will receive a drug information handout for each medication shipped and additional FDA Medication Guides  as required.  Verified that patient has previously received a Conservation officer, historic buildings and a Surveyor, mining.    The patient or caregiver noted above participated in the development of this care plan and knows that they can request review of or adjustments to the care plan at any time.      All of the patient's questions and concerns have been addressed.    Teofilo Pod, PharmD   [facility] Specialty Pharmacist

## 2023-06-01 MED ORDER — TRIAMCINOLONE ACETONIDE 0.1 % TOPICAL CREAM
3 refills | 0.00 days | Status: CP
Start: 2023-06-01 — End: ?
  Filled 2023-06-05: qty 80, 30d supply, fill #0

## 2023-06-04 ENCOUNTER — Encounter: Payer: Self-pay | Admitting: Podiatry

## 2023-06-04 ENCOUNTER — Ambulatory Visit (INDEPENDENT_AMBULATORY_CARE_PROVIDER_SITE_OTHER): Payer: BC Managed Care – PPO | Admitting: Podiatry

## 2023-06-04 DIAGNOSIS — L603 Nail dystrophy: Secondary | ICD-10-CM | POA: Diagnosis not present

## 2023-06-04 NOTE — Unmapped (Signed)
 Tracy Robles 's COSENTYX PEN (2 PENS) 150 mg/mL Pnij injection (secukinumab) shipment will be rescheduled as a result of Due to weather     I have spoken with the patient  at [phone]  and communicated the delivery change. We will reschedule the medication for the delivery date that the patient agreed upon.  We have confirmed the delivery date as 06/05/23 or 06/08/23 if needed

## 2023-06-04 NOTE — Progress Notes (Signed)
 Subjective:  Patient ID: Julia Snow, female    DOB: 27-Nov-1965,  MRN: [id number]  Chief Complaint  Patient presents with   Nail Problem    Left big toe pain    58 y.o. female presents with the above complaint.  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  She wanted get it evaluated she wants to make sure that everything is okay.  She had an ingrown removed in the past.  She wants to make sure that has not grown back denies any other acute issues   Review of Systems: Negative except as noted in the HPI. Denies N/V/F/Ch.  Past Medical History:  Diagnosis Date   Allergy    Hypertension    Miscarriage 2005   Sleep apnea    does not wear CPAP    Current Outpatient Medications:    amLODipine (NORVASC) 10 MG tablet, Take 1 tablet (10 mg total) by mouth daily., Disp: 90 tablet, Rfl: 1   Biotin 40981 MCG TABS, Take by mouth., Disp: , Rfl:    cetirizine (ZYRTEC) 5 MG tablet, Take by mouth., Disp: , Rfl:    ciclopirox (PENLAC) 8 % solution, Apply topically at bedtime. Apply over nail and surrounding skin. Apply daily over previous coat. After seven (7) days, may remove with alcohol and continue cycle., Disp: 6.6 mL, Rfl: 2   clobetasol (TEMOVATE) 0.05 % external solution, Apply topically to scalp twice daily., Disp: , Rfl:    ferrous sulfate 325 (65 FE) MG EC tablet, Take 325 mg by mouth 3 (three) times daily with meals., Disp: , Rfl:    fexofenadine (ALLEGRA) 60 MG tablet, Take by mouth., Disp: , Rfl:    gabapentin (NEURONTIN) 300 MG capsule, Take 2 capsules (600 mg total) by mouth daily as needed (shingles pain)., Disp: 30 capsule, Rfl: 1   hydrochlorothiazide (HYDRODIURIL) 25 MG tablet, Take 1 tablet (25 mg total) by mouth daily., Disp: 90 tablet, Rfl: 1   losartan (COZAAR) 100 MG tablet, Take 1 tablet (100 mg total) by mouth daily., Disp: 90 tablet, Rfl: 1   losartan-hydrochlorothiazide (HYZAAR) 100-25 MG tablet, Take 1 tablet by mouth  daily., Disp: , Rfl:    metFORMIN (GLUCOPHAGE-XR) 500 MG 24 hr tablet, Take 500 mg by mouth 3 (three) times daily., Disp: , Rfl:    Multiple Vitamin (MULTIVITAMIN) tablet, Take 1 tablet by mouth daily., Disp: , Rfl:    mupirocin ointment (BACTROBAN) 2 %, APPLY OINTMENT TOPICALLY TO AFFECTED AREA TWICE DAILY FOR 7 DAYS, Disp: , Rfl:    Secukinumab 150 MG/ML SOAJ, INJECT THE CONTENTS OF 2 PENS (300 MG) ONCE WEEKLY AT WEEKS 0, 1, 2, 3, AND 4. THEN INJECT 2 PENS (300 MG) EVERY 4 WEEKS, Disp: , Rfl:    Semaglutide,0.25 or 0.5MG /DOS, (OZEMPIC, 0.25 OR 0.5 MG/DOSE,) 2 MG/1.5ML SOPN, , Disp: , Rfl:    Semaglutide-Weight Management (WEGOVY) 1 MG/0.5ML SOAJ, Inject into the skin., Disp: , Rfl:    simvastatin (ZOCOR) 20 MG tablet, Take 1 tablet (20 mg total) by mouth at bedtime., Disp: 90 tablet, Rfl: 1   triamcinolone cream (KENALOG) 0.1 %, Apply topically 2 (two) times daily as needed., Disp: , Rfl:    triamcinolone cream (KENALOG) 0.5 %, Apply 1 application topically 3 (three) times daily., Disp: 454 g, Rfl: 5   vitamin E 1000 UNIT capsule, Take 1,000 Units by mouth daily., Disp: , Rfl:   Social History   Tobacco Use  Smoking Status Never  Smokeless Tobacco  Never    Allergies  Allergen Reactions   Lisinopril Cough   Penicillins    Latex Rash    Other reaction(s): UNKNOWN Other reaction(s): UNKNOWN Other reaction(s): UNKNOWN    Objective:  There were no vitals filed for this visit. There is no height or weight on file to calculate BMI. Constitutional Well developed. Well nourished.  Vascular Dorsalis pedis pulses palpable bilaterally. Posterior tibial pulses palpable bilaterally. Capillary refill normal to all digits.  No cyanosis or clubbing noted. Pedal hair growth normal.  Neurologic Normal speech. Oriented to person, place, and time. Epicritic sensation to light touch grossly present bilaterally.  Dermatologic Nails well groomed and normal in appearance. No open wounds. No skin  lesions.  Orthopedic: Pain on palpation left hallux nail.  Mild dystrophy noted.  No other abnormalities identified.   Radiographs: None Assessment:   1. Nail dystrophy    Plan:  Patient was evaluated and treated and all questions answered.  Left hallux nail dystrophy -At this time I do not appreciate any signs of recurrence of ingrown.  I discussed with the patient that if it reoccurs to come back and see me.  Slant back procedure was performed in standard technique no complication noted.  No follow-ups on file.

## 2023-06-05 MED FILL — COSENTYX PEN 300 MG/2 PENS (150 MG/ML) SUBCUTANEOUS: SUBCUTANEOUS | 28 days supply | Qty: 2 | Fill #3

## 2023-06-19 ENCOUNTER — Ambulatory Visit
Admit: 2023-06-19 | Payer: BLUE CROSS/BLUE SHIELD | Attending: Rehabilitative and Restorative Service Providers" | Primary: Rehabilitative and Restorative Service Providers"

## 2023-07-01 NOTE — Unmapped (Signed)
 [facility] Refill Coordination Note    Specialty Medication(s) to be Shipped:   Inflammatory Disorders: Cosentyx    Other medication(s) to be shipped: No additional medications requested for fill at this time     Tracy Robles, DOB: 1965-06-28  Phone: There are no phone numbers on file.      All above HIPAA information was verified with patient.     Was a Nurse, learning disability used for this call? No    Completed refill call assessment today to schedule patient's medication shipment from the [facility]  [phone]).  All relevant notes have been reviewed.     Specialty medication(s) and dose(s) confirmed: Regimen is correct and unchanged.   Changes to medications: Tracy reports no changes at this time.  Changes to insurance: No  New side effects reported not previously addressed with a pharmacist or physician: None reported  Questions for the pharmacist: No    Confirmed patient received a Conservation officer, historic buildings and a Surveyor, mining with first shipment. The patient will receive a drug information handout for each medication shipped and additional FDA Medication Guides as required.       DISEASE/MEDICATION-SPECIFIC INFORMATION        For patients on injectable medications: Patient currently has 0 doses left.  Next injection is scheduled for 07/10/2023.    SPECIALTY MEDICATION ADHERENCE     Medication Adherence    Patient reported X missed doses in the last month: 0  Specialty Medication: secukinumab: COSENTYX PEN (2 PENS) 150 mg/mL Pnij injection  Patient is on additional specialty medications: No              Were doses missed due to medication being on hold? No    secukinumab: COSENTYX PEN (2 PENS) 150 mg/mL Pnij injection: 0 doses of medicine on hand       REFERRAL TO PHARMACIST     Referral to the pharmacist: Not needed      SHIPPING     Shipping address confirmed in Epic.     Cost and Payment: Patient has a $0 copay, payment information is not required.    Delivery Scheduled: Yes, Expected medication delivery date: 07/08/2023.     Medication will be delivered via Same Day Courier to the prescription address in Epic [facility].    Tracy Robles   [facility]  Specialty Technician

## 2023-07-02 ENCOUNTER — Ambulatory Visit: Admit: 2023-07-02 | Payer: BLUE CROSS/BLUE SHIELD

## 2023-07-02 ENCOUNTER — Ambulatory Visit: Admit: 2023-07-02 | Discharge: 2023-07-23 | Payer: BLUE CROSS/BLUE SHIELD

## 2023-07-02 NOTE — Unmapped (Signed)
 [facility] PT [facility] [place]  OUTPATIENT PHYSICAL THERAPY  07/02/2023  Note Type: Treatment Note       Patient Name: Tracy Robles  Date of Birth:11-19-1965  Diagnosis:   Encounter Diagnoses   Name Primary?    Chronic bilateral low back pain with left-sided sciatica Yes    Spondylolisthesis at L4-L5 level          Referring Provider: Birdie Sons    Date of Onset of Impairment: 01/02/2023  Date PT Care Plan Established or Reviewed: 03/26/2023  Date PT Treatment Started: 03/26/2023     Plan of Care Effective Date: (1/5 auth until 08/28/23)  Session Number:  4     ASSESSMENT & PLAN   Assessment  Assessment details:    Tracy Robles presents for PT follow up with continued bilateral low back pain with movement coordination impairments. Remains limited by deconditioning of trunk musculature. Education provided on HEP adherence enhancement. Tracy Robles will continue to benefit from skilled Physical Therapy intervention to address the mild impairments listed below and to maximize her functional independence and safety.       Evaluation: Tracy Robles is a 58 y.o. female presents with bilateral low back dysfunction which is limiting her ability to perform the functional activities listed below.  Based on patient's presentation in clinic today, signs and symptoms appear to be consistent with low back pain with movement coordination impairments with general deconditioning.  The patient presents with associated impairments outlined below impacting her current functional status as outlined below.  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  I anticipate the patient will require the specified duration and frequency of care to achieve her goals for rehab to return this more desirable level of function.          Impairments: pain, decreased strength, decreased range of motion, decreased knowledge of self-care and impaired ADLs    Other impairment: limited understanding of the rehabilitation process, exercise progression, and self-care    Personal Factors/Comorbidities: 1-2    Specific Comorbidities: HTN, DM-II, Obesity      Clinical Presentation: stable    Clinical Decision Making: low    Prognosis: excellent prognosis    Positive Prognosis Rationale: motivated for treatment and strength.  Negative Prognosis Rationale: caregiver/family support, Pain Status and chronicity of condition.      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  2) Pt will improve to >80 related norms to demonstrate improved LE endurance and gait speed.   3) Pt will demonstrate full, pain free ROM of lumbar extension to allow for improved walking tolerance with rotational movement.  4) Pt will be able to stand for >45 min to demonstrate improved activity tolerance.       Plan    Therapy options: will be seen for skilled physical therapy services    Planned therapy interventions: 97110-Therapeutic Exercises, 97112-Neuromuscular Re-education, 97140-Manual Therapy, 97164-Re-evaluation, 97530-Therapeutic Activities, 97535-Self-Care/Home Training and 97116-Gait Training    Other planned therapy interventions: Therapeutic Activity, Self-Care/Home Management Training, and all PT modalities as indicated      Frequency: 1x week (tapering frequency towards independence)    Duration in  weeks: 10    Education provided to: patient.    Education provided: Treatment options and plan and HEP    Education results: needs reinforcement and needs further instruction.        Total Session Time: equates to total treatment time unless otherwise noted.          SUBJECTIVE         History of Present Condition     History of Present Condition/Chief Complaint: M54.50,G89.29 (ICD-10-CM) - Chronic bilateral low back pain without sciatica  M43.16 (ICD-10-CM) - Spondylolisthesis at L4-L5 level    Long history low back pain.  Right side worse than left.  Shooting pain down left leg over thigh occurring intermittent.  Primary pain right-sided axial low back pain.  Does note intermittent buttock pain.  Pain consistently worse with sitting for extended periods.  Denies any current radicular pain/paresthesia.     Functional limitations include: pain with sitting for prolonged periods    Subjective:  Wrist is hurting so can't do band exercises. No time for HEP due to work and taking care of mom/house.   Pain:     Current pain rating:  0    At best pain rating:  0    At worst pain rating:  5  Location:  Lower part of back is sharp, hip is vibrating and sharp    Quality:  Aching, tingling, sharp and stabbing    Relieving factors:  Rest and medications (Stretching)    Aggravating factors:  Standing, walking, stairs, sitting and lying  Precautions/Equipment   Precautions:  None    Current Braces/Orthoses:  None    Equipment Currently Used:  None  Prior Functional Status     No physical limitations    Current Functional Status:    disturbed sleep, limited lifting, limited standing tolerance, limited walking tolerance, limited sitting tolerance, limited bending, 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    Grade 1 anterolisthesis of L3 on L4. Mild left lateral listhesis of L3 on L4.    Mild to moderate multilevel degenerative changes. Findings are slightly progressed since 11/18/2018      Treatments:     Previous treatment:  Physical therapy    Current treatment: physical therapy    Patient Goals:     Patient/Family goals for therapy:  Increased strength, improved standing tolerance, improved sleep, improved sitting tolerance, improved ambulation and decreased pain    Other patient goals:  Increase driving time, cooking, standing 60+        OBJECTIVE     Outcome Measure: Revised Oswestry: Oswestry Score: 21 / 50 or 42 %     Posture/Observation:   Standing: unremarkable  Gait: non-antalgic  BIL hip drop L>R    Lumbar Spine ROM  Motion No ROM Loss  (0%) Min ROM Loss  (1-33%) Mod ROM Loss  (34-65%) Major ROM Loss  (66-100%) Symptoms    Flexion  X   Less pain feels good   Extension   x  Pain lower lumbar   Sidebend Right  X   Pain with overpressure   Side bend Left  X      Rotation R  X   No pain; OP not tested   Rotation L   X   No pain; OP  not tested       Function:  30s STS (mat table 17 inches, no UE assist):   10 reps (rocking noted)  9 reps (without rocking, some LBP with rise and lower) with increased forward flexion  2 minute walk test: 366 ft  With no assistive device, 66% of norm 1.5 min in with pain (eval)    05/21/23  : 341 ft, mild LBP pain at 1:20 minutes        TREATMENT RENDERED       Latex allergy **     Therapeutic Exercise: 38 Minutes   Performed with direct PT demonstration, instruction, supervision, and guidance.   - Reassessment of symptoms  - Standing marches: 2 x 30 seconds   *medium challenge, 2nd set addition of contralteral knee touch  - Standing hip abduction 2x10   - squats on wall 2x8   - seated repeated lumbar flexion 2x5  - seated hip flexion x10   - HEP reivew       Next Visit Plan: has access to gym at work  (leg press and bike)    core stabilization exercises not in supine or hooklying (plank, pallof), glute strengthening, quad strengthening, generals strength/conditioning      Total Treatment Time: 38 Minutes        Therapeutic Interventions Charges  $$ Therapeutic Exercise [mins]: 38                 I attest that I have reviewed the above information.  Signed: Barron Alvine, PT, DPT  07/02/2023 11:53 AM        I reviewed the no-show/attendance policy with the patient and caregiver(s). The patient is aware that they must call to cancel appointments more than 24 hours in advance. They are also aware that if they late cancel or no-show three times, we reserve the right to cancel their remaining appointments. This policy is in place to allow [place] to best serve the needs of our caseload.    If patient returns to clinic with variance in plan of care, then it may be attributable to one or more of the following factors: preferred clinician availability, appointment time request availability, therapy pool appointment availability, major holiday with clinic closure, caregiver availability, patient transportation, conflicting medical appointment, inclement weather, and/or patient illness.    If patient does not return for follow up visit(s) related to this episode of care, this note will serve as their discharge note from Physical Therapy.    This care was provided during physical therapy residency mentored training.  I was present throughout and participated in the examination, assessment, treatment and plan of care.

## 2023-07-06 NOTE — Unmapped (Signed)
 Assessment and Plan:     Type 2 diabetes mellitus with hyperglycemia, without long-term current use of insulin (CMS-HCC)  -     Mounjaro; Inject 12.5 mg under the skin every seven (7) days.  Tracy Robles; Inject 12.5 mg under the skin every seven (7) days.  -     POCT glycosylated hemoglobin (Hb A1C)    Primary hypertension    Class 3 severe obesity with serious comorbidity and body mass index (BMI) of 50.0 to 59.9 in adult, unspecified obesity type (CMS-HCC)  -     Mounjaro; Inject 12.5 mg under the skin every seven (7) days.    Other orders  -     Health Maintenance Outside Records Request; Future      Multiple diagnoses above reviewed with patient  Blood pressure borderline at goal at triage today. SBP above goal. Reviewed goal less than 130/80. Continue to monitor at home.   Weight trends reviewed with patient. Weight up 9 lbs since last visit. Reviewed importance of healthy diet and lifestyle.   Today's A1c is 5.8% at goal less than 7.   Continue current medication management  INCREASE Mounjaro to 12.5 mg weekly dose to additional weight loss. New Rx sent.   Rx as per orders; reviewed risks/benefits, possible side effects and patient verbalizes understanding  Recommend supportive treatment with lifestyle modifications with diet/exercise  Follow up in 3-4 months or sooner prn      Barriers to recommended plan: None identified    I personally spent 30 minutes face-to-face and non-face-to-face in the care of this patient, which includes all pre, intra, and post visit time (reviewing, evaluating and discussing pertinent records for patient's care) on the date of service.     Return in about 4 months (around 11/06/2023) for 40 minute follow up CDM.    Subjective:     HPI: Tracy Robles is a 58 y.o. female here for med refills. Patient doing well overall today.     Hypertension  Patient with history of hypertension manages with amlodipine 10 mg daily, hydrochlorothiazide 25 mg daily, losartan 100 mg daily, and spironolactone 25 mg daily. Blood pressure at triage today 134/78. Checking home blood pressure and averaging 134-150 / 90s. Denies CP, SOB, L/D or peripheral edema.    BP Readings from Last 3 Encounters:   07/07/23 134/78   05/21/23 147/79   05/14/23 146/85     Left wrist pain  Patient received injections in both of her wrists ~1 month ago. Her right wrist is not hurting any more but her left wrist continues to bother her. She has been using Voltaren gel but this is not helpful.     DM2  On Jardiance 10 mg daily and Mounjaro 10 mg weekly injection, tolerating meds well. Patient requesting a refill of Mounjaro today. She was previously taking higher dose and tolerating well but her pharmacy did not have this in stock. Last A1c well controlled at 5.5 (02/24/23). Patient is seeing [name] RD for nutrition counseling. Patient saw optometry in 04/2023 for her yearly vision screening; will send records over.       HPI     ROS:   Review of Systems     Review of systems negative unless otherwise noted as per HPI.      The following portions of the patient's history were reviewed and updated as appropriate: allergies, current medications, past family history, past medical history, past social history, past surgical history  and problem list.     Objective:     Vitals:    07/07/23 1302   BP: 134/78   Pulse: 88   Temp: 36.7 ??C (98.1 ??F)     Body mass index is 52.99 kg/m??.    Physical Exam  Vitals and nursing note reviewed.   Constitutional:       General: She is not in acute distress.     Appearance: Normal appearance. She is not ill-appearing or toxic-appearing.   HENT:      Head: Normocephalic and atraumatic.   Cardiovascular:      Rate and Rhythm: Normal rate and regular rhythm.      Heart sounds: Normal heart sounds.   Pulmonary:      Effort: Pulmonary effort is normal. No respiratory distress.      Breath sounds: Normal breath sounds.   Musculoskeletal:      Cervical back: Neck supple.   Neurological:      Mental Status: She is alert.          Allergies:     Lisinopril, Latex, and Penicillins    [facility]:     Medication adherence and barriers to the treatment plan have been addressed. Opportunities to optimize healthy behaviors have been discussed. Patient / caregiver voiced understanding.      I attest that I, Zuling F Quade, personally documented this note while acting as scribe for Payton Mccallum, MD.      Ranee Gosselin, Scribe.  07/07/2023     The documentation recorded by the scribe accurately reflects the service I personally performed and the decisions made by me.    Payton Mccallum, MD

## 2023-07-07 ENCOUNTER — Ambulatory Visit: Admit: 2023-07-07 | Discharge: 2023-07-08 | Payer: BLUE CROSS/BLUE SHIELD

## 2023-07-07 DIAGNOSIS — E66813 Class 3 severe obesity with serious comorbidity and body mass index (BMI) of 50.0 to 59.9 in adult, unspecified obesity type (CMS-HCC): Principal | ICD-10-CM

## 2023-07-07 DIAGNOSIS — E1165 Type 2 diabetes mellitus with hyperglycemia: Principal | ICD-10-CM

## 2023-07-07 DIAGNOSIS — I10 Essential (primary) hypertension: Principal | ICD-10-CM

## 2023-07-07 DIAGNOSIS — Z6841 Body Mass Index (BMI) 40.0 and over, adult: Principal | ICD-10-CM

## 2023-07-07 MED ORDER — MOUNJARO 12.5 MG/0.5 ML SUBCUTANEOUS PEN INJECTOR
SUBCUTANEOUS | 3 refills | 0 days | Status: CP
Start: 2023-07-07 — End: ?

## 2023-07-07 MED ORDER — MOUNJARO 10 MG/0.5 ML SUBCUTANEOUS PEN INJECTOR
SUBCUTANEOUS | 3 refills | 28 days | Status: CN
Start: 2023-07-07 — End: ?

## 2023-07-08 MED FILL — COSENTYX PEN 300 MG/2 PENS (150 MG/ML) SUBCUTANEOUS: SUBCUTANEOUS | 28 days supply | Qty: 2 | Fill #4

## 2023-07-10 NOTE — Unmapped (Signed)
 Received notification from the pharmacy that prescription [facility] requires prior authorization. Prior authorization and clinical documentation submitted via Epic interface at this time, awaiting decision.

## 2023-07-13 NOTE — Unmapped (Signed)
 Received an approval on the prior authorization submitted for prescription Mounjaro. Call placed to the patient to make them aware, copy of approval attached below

## 2023-07-20 NOTE — Unmapped (Signed)
 [facility] PT [facility] [place]  OUTPATIENT PHYSICAL THERAPY  07/21/2023  Note Type: Treatment Note       Patient Name: Tracy Robles  Date of Birth:1965/11/28  Diagnosis:   Encounter Diagnoses   Name Primary?    Chronic bilateral low back pain with left-sided sciatica Yes    Spondylolisthesis at L4-L5 level        Referring Provider: Rolande Cleverly    Date of Onset of Impairment: 01/02/2023  Date PT Care Plan Established or Reviewed: 03/26/2023  Date PT Treatment Started: 03/26/2023     Plan of Care Effective Date: (2/5 auth until 08/28/23)  Session Number:  5     ASSESSMENT & PLAN   Assessment  Assessment details:    Tracy Robles presents for PT follow up with continued bilateral low back pain with movement coordination impairments. 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 Patient arrived to appointment reporting needing to leave early due to being hungry and needing to get food during work break - limited progression exercise intensity and complete update of HEP. Encouraged patient to eat prior to next session for proper fueling and to allow full time in session. Updated HEP to progress hip, core, and quad strength. Education provided on HEP adherence enhancement. Tracy Robles will continue to benefit from skilled Physical Therapy intervention to address the mild impairments listed below and to maximize her functional independence and safety.       Evaluation: Tracy Robles is a 58 y.o. female presents with bilateral low back dysfunction which is limiting her ability to perform the functional activities listed below.  Based on patient's presentation in clinic today, signs and symptoms appear to be consistent with low back pain with movement coordination impairments with general deconditioning.  The patient presents with associated impairments outlined below impacting her current functional status as outlined below.  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  I anticipate the patient will require the specified duration and frequency of care to achieve her goals for rehab to return this more desirable level of function.          Impairments: pain, decreased strength, decreased range of motion, decreased knowledge of self-care and impaired ADLs    Other impairment: limited understanding of the rehabilitation process, exercise progression, and self-care    Personal Factors/Comorbidities: 1-2    Specific Comorbidities: HTN, DM-II, Obesity      Clinical Presentation: stable    Clinical Decision Making: low    Prognosis: excellent prognosis    Positive Prognosis Rationale: motivated for treatment and strength.  Negative Prognosis Rationale: caregiver/family support, Pain Status and chronicity of condition.      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  2) Pt will improve to >80 related norms to demonstrate improved LE endurance and gait speed.   3) Pt will demonstrate full, pain free ROM of lumbar extension to allow for improved walking tolerance with rotational movement.  4) Pt will be able to stand for >45 min to demonstrate improved activity tolerance.       Plan    Therapy options: will  be seen for skilled physical therapy services    Planned therapy interventions: 97110-Therapeutic Exercises, 97112-Neuromuscular Re-education, 97140-Manual Therapy, 97164-Re-evaluation, 97530-Therapeutic Activities, 97535-Self-Care/Home Training and 97116-Gait Training    Other planned therapy interventions: Therapeutic Activity, Self-Care/Home Management Training, and all PT modalities as indicated      Frequency: 1x week (tapering frequency towards independence)    Duration in weeks: 10    Education provided to: patient.    Education provided: Treatment options and plan and HEP    Education results: needs reinforcement and needs further instruction.        Total Session Time: equates to total treatment time unless otherwise noted.          SUBJECTIVE         History of Present Condition     History of Present Condition/Chief Complaint:  M54.50,G89.29 (ICD-10-CM) - Chronic bilateral low back pain without sciatica  M43.16 (ICD-10-CM) - Spondylolisthesis at L4-L5 level    Long history low back pain.  Right side worse than left.  Shooting pain down left leg over thigh occurring intermittent.  Primary pain right-sided axial low back pain.  Does note intermittent buttock pain.  Pain consistently worse with sitting for extended periods.  Denies any current radicular pain/paresthesia.     Functional limitations include: pain with sitting for prolonged periods    Subjective:  My wrist still hurts. Not having pain in my back but still have the popping. Worst pain in the past week: 5/10 pain. 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 I am very hungry, I need to leave early to get food  Pain:     Current pain rating:  0    At best pain rating:  0    At worst pain rating:  5  Location:  Lower part of back is sharp, hip is vibrating and sharp    Quality:  Aching, tingling, sharp and stabbing    Relieving factors:  Rest and medications (Stretching)    Aggravating factors:  Standing, walking, stairs, sitting and lying  Precautions/Equipment   Precautions:  None    Current Braces/Orthoses:  None    Equipment Currently Used:  None  Prior Functional Status     No physical limitations    Current Functional Status:    disturbed sleep, limited lifting, limited standing tolerance, limited walking tolerance, limited sitting tolerance, limited bending, 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    Grade 1 anterolisthesis of L3 on L4. Mild left lateral listhesis of L3 on L4.    Mild to moderate multilevel degenerative changes. Findings are slightly progressed since 11/18/2018      Treatments:     Previous treatment:  Physical therapy    Current treatment: physical therapy    Patient Goals:     Patient/Family goals for therapy:  Increased strength, improved standing tolerance, improved sleep, improved sitting tolerance, improved ambulation and decreased pain    Other patient goals:  Increase driving time, cooking, standing 60+        OBJECTIVE     Outcome Measure: Revised Oswestry: Oswestry Score: 21 / 50 or 42 %     Posture/Observation:   Standing: unremarkable  Gait: non-antalgic  BIL hip drop L>R  Lumbar Spine ROM  Motion No ROM Loss  (0%) Min ROM Loss  (1-33%) Mod ROM Loss  (34-65%) Major ROM Loss  (66-100%) Symptoms    Flexion  X   Less pain feels good   Extension   x  Pain lower lumbar   Sidebend Right  X   Pain with overpressure   Side bend Left  X      Rotation R  X   No pain; OP not tested   Rotation L   X   No pain; OP not tested       Function:  30s STS (mat table 17 inches, no UE assist):   10 reps (rocking noted)  9 reps (without rocking, some LBP with rise and lower) with increased forward flexion  2 minute walk test: 366 ft  With no assistive device, 66% of norm 1.5 min in with pain (eval)    05/21/23  : 341 ft, mild LBP pain at 1:20 minutes        TREATMENT RENDERED     Latex allergy **     Therapeutic Exercise: 30 Minutes   Performed with direct PT demonstration, instruction, supervision, and guidance.   - Reassessment of symptoms    Nustep: 5:00 minutes, level 1  Review of set up of Nustep and recumbent bike  Leg press: 2 x 10 reps   Standing march with contralateral knee touch: verbal review (+HEP)  LAQ with orange latex free band: 2 x 8 reps bilateral  Standing hip abduction: x 8 reps bil    Goal: 2x/week to the gym (4x until next appointment)      Next Visit Plan: check in on gym compliance - has access to gym at work  (leg press and bike)    core stabilization exercises not in supine or hooklying (plank, pallof), glute strengthening, quad strengthening, generals strength/conditioning      Total Treatment Time: 30 Minutes        Therapeutic Interventions Charges  $$ Therapeutic Exercise [mins]: 30                 I attest that I have reviewed the above information.  Signed: Dannette Dutch, PT, DPT  07/21/2023 11:54 AM        I reviewed the no-show/attendance policy with the patient and caregiver(s). The patient is aware that they must call to cancel appointments more than 24 hours in advance. They are also aware that if they late cancel or no-show three times, we reserve the right to cancel their remaining appointments. This policy is in place to allow us  to best serve the needs of our caseload.    If patient returns to clinic with variance in plan of care, then it may be attributable to one or more of the following factors: preferred clinician availability, appointment time request availability, therapy pool appointment availability, major holiday with clinic closure, caregiver availability, patient transportation, conflicting medical appointment, inclement weather, and/or patient illness.    If patient does not return for follow up visit(s) related to this episode of care, this note will serve as their discharge note from Physical Therapy.    This care was provided during physical therapy residency mentored training.  I was present throughout and participated in the examination, assessment, treatment and plan of care.

## 2023-07-30 NOTE — Unmapped (Signed)
 [facility] Refill Coordination Note    Specialty Medication(s) to be Shipped:   Inflammatory Disorders: Cosentyx     Other medication(s) to be shipped: No additional medications requested for fill at this time     Tracy Robles, DOB: 21-Nov-1965  Phone: There are no phone numbers on file.      All above HIPAA information was verified with patient.     Was a Nurse, learning disability used for this call? No    Completed refill call assessment today to schedule patient's medication shipment from the [facility]  [phone]).  All relevant notes have been reviewed.     Specialty medication(s) and dose(s) confirmed: Regimen is correct and unchanged.   Changes to medications: Tracy Robles reports no changes at this time.  Changes to insurance: No  New side effects reported not previously addressed with a pharmacist or physician: None reported  Questions for the pharmacist: No    Confirmed patient received a Conservation officer, historic buildings and a Surveyor, mining with first shipment. The patient will receive a drug information handout for each medication shipped and additional FDA Medication Guides as required.       DISEASE/MEDICATION-SPECIFIC INFORMATION        For patients on injectable medications: Patient currently has 0 doses left.  Next injection is scheduled for 08/07/2023.    SPECIALTY MEDICATION ADHERENCE     Medication Adherence    Patient reported X missed doses in the last month: 0  Specialty Medication: secukinumab  (COSENTYX  PEN, 2 PENS,) 150 mg/mL PnIj injection  Patient is on additional specialty medications: No  Informant: patient              Were doses missed due to medication being on hold? No    secukinumab : COSENTYX  PEN (2 PENS) 150 mg/mL Pnij injection: 0 doses of medicine on hand       REFERRAL TO PHARMACIST     Referral to the pharmacist: Not needed      SHIPPING     Shipping address confirmed in Epic.     Cost and Payment: Patient has a $0 copay, payment information is not required.    Delivery Scheduled: Yes, Expected medication delivery date: 08/05/2023.     Medication will be delivered via Same Day Courier to the prescription address in Epic WAM.    Tracy Robles [facility] and [facility]

## 2023-08-04 ENCOUNTER — Ambulatory Visit: Admit: 2023-08-04 | Payer: BLUE CROSS/BLUE SHIELD

## 2023-08-04 ENCOUNTER — Ambulatory Visit: Admit: 2023-08-04 | Discharge: 2023-08-22 | Payer: BLUE CROSS/BLUE SHIELD

## 2023-08-04 NOTE — Unmapped (Signed)
 [facility]  OUTPATIENT PHYSICAL THERAPY  08/04/2023  Note Type: Treatment Note       Patient Name: Tracy Robles  Date of Birth:[birth year]  Diagnosis:   Encounter Diagnoses   Name Primary?    Chronic bilateral low back pain with left-sided sciatica Yes    Spondylolisthesis at L4-L5 level        Referring Provider: Rolande Cleverly    Date of Onset of Impairment: 01/02/2023  Date PT Care Plan Established or Reviewed: 03/26/2023  Date PT Treatment Started: 03/26/2023     Plan of Care Effective Date: (3/5 auth until 08/28/23)    Session Number:  6     ASSESSMENT & PLAN   Assessment  Assessment details:    Tracy Robles presents for PT follow up with continued bilateral low back pain with movement coordination impairments. Today, patient was dealing with psychosocial concerns which limited session participation. We reviewed her home exercise program and discussed strategies to aid in exercise compliance. Tracy Robles will continue to benefit from skilled Physical Therapy intervention to address the mild impairments listed below and to maximize her functional independence and safety.       Evaluation: Tracy Robles is a 58 y.o. female presents with bilateral low back dysfunction which is limiting her ability to perform the functional activities listed below.  Based on patient's presentation in clinic today, signs and symptoms appear to be consistent with low back pain with movement coordination impairments with general deconditioning.  The patient presents with associated impairments outlined below impacting her current functional status as outlined below.  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  I anticipate the patient will require the specified duration and frequency of care to achieve her goals for rehab to return this more desirable level of function.          Impairments: pain, decreased strength, decreased range of motion, decreased knowledge of self-care and impaired ADLs    Other impairment: limited understanding of the rehabilitation process, exercise progression, and self-care    Personal Factors/Comorbidities: 1-2    Specific Comorbidities: HTN, DM-II, Obesity      Clinical Presentation: stable    Clinical Decision Making: low    Prognosis: excellent prognosis    Positive Prognosis Rationale: motivated for treatment and strength.  Negative Prognosis Rationale: caregiver/family support, Pain Status and chronicity of condition.      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  2) Pt will improve to >80 related norms to demonstrate improved LE endurance and gait speed.   3) Pt will demonstrate full, pain free ROM of lumbar extension to allow for improved walking tolerance with rotational movement.  4) Pt will be able to stand for >45 min to demonstrate improved activity tolerance.       Plan    Therapy options: will be seen for skilled physical therapy services    Planned therapy interventions: 97110-Therapeutic Exercises, 97112-Neuromuscular Re-education, 97140-Manual Therapy, 97164-Re-evaluation, 97530-Therapeutic Activities, 97535-Self-Care/Home Training and 97116-Gait Training    Other planned therapy interventions: Therapeutic Activity, Self-Care/Home Management Training, and all PT modalities as indicated  Frequency: 1x week (tapering frequency towards independence)    Duration in weeks: 10    Education provided to: patient.    Education provided: Treatment options and plan and HEP    Education results: needs reinforcement and needs further instruction.        Total Session Time: equates to total treatment time unless otherwise noted.          SUBJECTIVE       History of Present Condition     History of Present Condition/Chief Complaint:  M54.50,G89.29 (ICD-10-CM) - Chronic bilateral low back pain without sciatica  M43.16 (ICD-10-CM) - Spondylolisthesis at L4-L5 level    Long history low back pain.  Right side worse than left.  Shooting pain down left leg over thigh occurring intermittent.  Primary pain right-sided axial low back pain.  Does note intermittent buttock pain.  Pain consistently worse with sitting for extended periods.  Denies any current radicular pain/paresthesia.     Functional limitations include: pain with sitting for prolonged periods    Subjective:  Stressful day.   Pain:     Current pain rating:  0    At best pain rating:  0    At worst pain rating:  5  Location:  Lower part of back is sharp, hip is vibrating and sharp    Quality:  Aching, tingling, sharp and stabbing    Relieving factors:  Rest and medications (Stretching)    Aggravating factors:  Standing, walking, stairs, sitting and lying  Precautions/Equipment   Precautions:  None    Current Braces/Orthoses:  None    Equipment Currently Used:  None  Prior Functional Status     No physical limitations    Current Functional Status:    disturbed sleep, limited lifting, limited standing tolerance, limited walking tolerance, limited sitting tolerance, limited bending, 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    Grade 1 anterolisthesis of L3 on L4. Mild left lateral listhesis of L3 on L4.    Mild to moderate multilevel degenerative changes. Findings are slightly progressed since 11/18/2018      Treatments:     Previous treatment:  Physical therapy  Patient Goals:     Patient/Family goals for therapy:  Increased strength, improved standing tolerance, improved sleep, improved sitting tolerance, improved ambulation and decreased pain    Other patient goals:  Increase driving time, cooking, standing 60+        OBJECTIVE     Outcome Measure: Revised Oswestry: Oswestry Score: 21 / 50 or 42 %     Posture/Observation:   Standing: unremarkable  Gait: non-antalgic  BIL hip drop L>R    Lumbar Spine ROM  Motion No ROM Loss  (0%) Min ROM Loss  (1-33%) Mod ROM Loss  (34-65%) Major ROM Loss  (66-100%) Symptoms    Flexion  X   Less pain feels good   Extension   x  Pain lower lumbar   Sidebend Right  X   Pain with overpressure   Side bend Left  X      Rotation R  X   No pain; OP not tested   Rotation L   X   No pain; OP not tested       Function:  30s STS (mat table 17 inches,  no UE assist):   10 reps (rocking noted)  9 reps (without rocking, some LBP with rise and lower) with increased forward flexion  2 minute walk test: 366 ft  With no assistive device, 66% of norm 1.5 min in with pain (eval)    05/21/23  : 341 ft, mild LBP pain at 1:20 minutes        TREATMENT RENDERED     Latex allergy **     Therapeutic Exercise: 25 Minutes   Performed with direct PT demonstration, instruction, supervision, and guidance.   - Reassessment of symptoms  - nustep L3 x5 min   - HEP review as below     Program Notes  Increase walking throughout the day as able (ie  parking spot a little farther away from the store, walk on the weekend)Perform either leg press or wall squat    Exercises  - Recumbent Bike  - 1 x daily - 2 x weekly - 15 minute hold  - Full Leg Press  - 1 x daily - 2 x weekly - 2 sets - 10 reps - 20lb hold  - Wall Quarter Squat  - 3 x weekly - 2 sets - 10 reps  - Seated Trunk Rotation - Arms Crossed  - 1 x daily - 7 x weekly - 2 sets - 10 reps  - Seated Flexion Stretch  - 5 x daily - 1 sets - 5 reps - 20-30s hold  - Standing Hip Abduction with Counter Support  - 1 x daily - 3 x weekly - 2 sets - 8-10 reps  - Standing March  - 1 x daily - 3 x weekly - 3 sets - 30-60 sec hold  - Sitting Knee Extension with Resistance  - 1 x daily - 3 x weekly - 2 sets - 8-10 reps - orange hold    Goal: join the gym before next session (waiting on HR paperwork)       Next Visit Plan: check in on gym compliance - has access to gym at work  (leg press and bike)   core stabilization exercises not in supine or hooklying (plank, pallof), glute strengthening, quad strengthening, generals strength/conditioning      Total Treatment Time: 25 Minutes - due to patient request         Therapeutic Interventions Charges  $$ Therapeutic Exercise [mins]: 25                 I attest that I have reviewed the above information.  Signed: Loraine Freid K Emmajean Ratledge, PT, DPT  08/04/2023 11:52 AM        I reviewed the no-show/attendance policy with the patient and caregiver(s). The patient is aware that they must call to cancel appointments more than 24 hours in advance. They are also aware that if they late cancel or no-show three times, we reserve the right to cancel their remaining appointments. This policy is in place to allow us  to best serve the needs of our caseload.    If patient returns to clinic with variance in plan of care, then it may be attributable to one or more of the following factors: preferred clinician availability, appointment time request availability, therapy pool appointment availability, major holiday with clinic closure, caregiver availability, patient transportation, conflicting medical appointment, inclement weather, and/or patient illness.    If patient does not return for follow up visit(s) related to this episode of care, this note will serve as their discharge note from Physical Therapy.  This care was provided during physical therapy residency mentored training.  I was present throughout and participated in the examination, assessment, treatment and plan of care.

## 2023-08-05 MED FILL — COSENTYX PEN 300 MG/2 PENS (150 MG/ML) SUBCUTANEOUS PEN INJECTOR: SUBCUTANEOUS | 28 days supply | Qty: 2 | Fill #5

## 2023-08-11 ENCOUNTER — Ambulatory Visit (INDEPENDENT_AMBULATORY_CARE_PROVIDER_SITE_OTHER): Admitting: Podiatry

## 2023-08-11 DIAGNOSIS — L603 Nail dystrophy: Secondary | ICD-10-CM

## 2023-08-11 NOTE — Progress Notes (Signed)
 Subjective:  Patient ID: Julia Snow, female    DOB: Jul 07, 1965,  MRN: [id number]  Chief Complaint  Patient presents with   Nail Problem    Nail discomfort     58 y.o. female presents with the above complaint.  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  She wanted get it evaluated she wants to make sure that everything is okay.  She had an ingrown removed in the past.  She wants to make sure that has not grown back denies any other acute issues   Review of Systems: Negative except as noted in the HPI. Denies N/V/F/Ch.  Past Medical History:  Diagnosis Date   Allergy    Hypertension    Miscarriage 2005   Sleep apnea    does not wear CPAP    Current Outpatient Medications:    amLODipine  (NORVASC ) 10 MG tablet, Take 1 tablet (10 mg total) by mouth daily., Disp: 90 tablet, Rfl: 1   Biotin 10000 MCG TABS, Take by mouth., Disp: , Rfl:    cetirizine (ZYRTEC) 5 MG tablet, Take by mouth., Disp: , Rfl:    ciclopirox  (PENLAC ) 8 % solution, Apply topically at bedtime. Apply over nail and surrounding skin. Apply daily over previous coat. After seven (7) days, may remove with alcohol and continue cycle., Disp: 6.6 mL, Rfl: 2   clobetasol (TEMOVATE) 0.05 % external solution, Apply topically to scalp twice daily., Disp: , Rfl:    ferrous sulfate 325 (65 FE) MG EC tablet, Take 325 mg by mouth 3 (three) times daily with meals., Disp: , Rfl:    fexofenadine (ALLEGRA) 60 MG tablet, Take by mouth., Disp: , Rfl:    gabapentin  (NEURONTIN ) 300 MG capsule, Take 2 capsules (600 mg total) by mouth daily as needed (shingles pain)., Disp: 30 capsule, Rfl: 1   hydrochlorothiazide  (HYDRODIURIL ) 25 MG tablet, Take 1 tablet (25 mg total) by mouth daily., Disp: 90 tablet, Rfl: 1   losartan  (COZAAR ) 100 MG tablet, Take 1 tablet (100 mg total) by mouth daily., Disp: 90 tablet, Rfl: 1   losartan -hydrochlorothiazide  (HYZAAR) 100-25 MG tablet, Take 1 tablet by mouth  daily., Disp: , Rfl:    metFORMIN  (GLUCOPHAGE -XR) 500 MG 24 hr tablet, Take 500 mg by mouth 3 (three) times daily., Disp: , Rfl:    Multiple Vitamin (MULTIVITAMIN) tablet, Take 1 tablet by mouth daily., Disp: , Rfl:    mupirocin ointment (BACTROBAN) 2 %, APPLY OINTMENT TOPICALLY TO AFFECTED AREA TWICE DAILY FOR 7 DAYS, Disp: , Rfl:    Secukinumab 150 MG/ML SOAJ, INJECT THE CONTENTS OF 2 PENS (300 MG) ONCE WEEKLY AT WEEKS 0, 1, 2, 3, AND 4. THEN INJECT 2 PENS (300 MG) EVERY 4 WEEKS, Disp: , Rfl:    Semaglutide,0.25 or 0.5MG /DOS, (OZEMPIC, 0.25 OR 0.5 MG/DOSE,) 2 MG/1.5ML SOPN, , Disp: , Rfl:    Semaglutide-Weight Management (WEGOVY) 1 MG/0.5ML SOAJ, Inject into the skin., Disp: , Rfl:    simvastatin  (ZOCOR ) 20 MG tablet, Take 1 tablet (20 mg total) by mouth at bedtime., Disp: 90 tablet, Rfl: 1   triamcinolone  cream (KENALOG ) 0.1 %, Apply topically 2 (two) times daily as needed., Disp: , Rfl:    triamcinolone  cream (KENALOG ) 0.5 %, Apply 1 application topically 3 (three) times daily., Disp: 454 g, Rfl: 5   vitamin E 1000 UNIT capsule, Take 1,000 Units by mouth daily., Disp: , Rfl:   Social History   Tobacco Use  Smoking Status Never  Smokeless Tobacco Never  Allergies  Allergen Reactions   Lisinopril Cough   Penicillins    Latex Rash    Other reaction(s): UNKNOWN Other reaction(s): UNKNOWN Other reaction(s): UNKNOWN    Objective:  There were no vitals filed for this visit. There is no height or weight on file to calculate BMI. Constitutional Well developed. Well nourished.  Vascular Dorsalis pedis pulses palpable bilaterally. Posterior tibial pulses palpable bilaterally. Capillary refill normal to all digits.  No cyanosis or clubbing noted. Pedal hair growth normal.  Neurologic Normal speech. Oriented to person, place, and time. Epicritic sensation to light touch grossly present bilaterally.  Dermatologic Nails well groomed and normal in appearance. No open wounds. No skin  lesions.  Orthopedic: Pain on palpation left hallux nail.  Mild dystrophy noted.  No other abnormalities identified.   Radiographs: None Assessment:   No diagnosis found.  Plan:  Patient was evaluated and treated and all questions answered.  Left hallux nail dystrophy -At this time I do not appreciate any signs of recurrence of ingrown.  I discussed with the patient that if it reoccurs to come back and see me.  Slant back procedure was performed in standard technique no complication noted.  No follow-ups on file.

## 2023-08-19 NOTE — Unmapped (Signed)
 [facility] PHYSICAL THERAPY  [facility]  [address] [place], [place] [address]    [phone]    This note is to let you know that Elvira Hammersmith did not show for their scheduled Physical Therapy follow-up session. Cancel same day. Please contact me if you have any questions or concerns.    Thank you for this referral,     Signed: Lasean Rahming K Tillman Kazmierski, PT  08/19/2023 8:20 AM

## 2023-08-23 DIAGNOSIS — M4316 Spondylolisthesis, lumbar region: Principal | ICD-10-CM

## 2023-08-23 DIAGNOSIS — G8929 Other chronic pain: Principal | ICD-10-CM

## 2023-08-23 DIAGNOSIS — M545 Chronic bilateral low back pain without sciatica: Principal | ICD-10-CM

## 2023-08-23 MED ORDER — MELOXICAM 15 MG TABLET
ORAL_TABLET | Freq: Every day | ORAL | 0 refills | 0.00000 days | PRN
Start: 2023-08-23 — End: ?

## 2023-08-24 MED ORDER — MELOXICAM 15 MG TABLET
ORAL_TABLET | Freq: Every day | ORAL | 0 refills | 30.00000 days | Status: CP | PRN
Start: 2023-08-24 — End: 2023-08-24

## 2023-08-27 ENCOUNTER — Ambulatory Visit (INDEPENDENT_AMBULATORY_CARE_PROVIDER_SITE_OTHER): Admitting: Podiatry

## 2023-08-27 ENCOUNTER — Encounter: Payer: Self-pay | Admitting: Podiatry

## 2023-08-27 DIAGNOSIS — L600 Ingrowing nail: Secondary | ICD-10-CM

## 2023-08-27 NOTE — Progress Notes (Signed)
 Subjective:  Patient ID: Julia Snow, female    DOB: 03/11/1966,  MRN: [id number]  Chief Complaint  Patient presents with   Nail Problem    58 y.o. female presents with the above complaint.  Patient presents with left hallux lateral border ingrown painful to touch is progressive and worse worse with ambulation is with pressure patient states that pain scale 7 out of 10 dull aching nature she would like to have it removed denies any other acute complaints   Review of Systems: Negative except as noted in the HPI. Denies N/V/F/Ch.  Past Medical History:  Diagnosis Date   Allergy    Hypertension    Miscarriage 2005   Sleep apnea    does not wear CPAP    Current Outpatient Medications:    diclofenac Sodium (VOLTAREN) 1 % GEL, Apply 1 g topically., Disp: , Rfl:    meloxicam (MOBIC) 15 MG tablet, Take 15 mg by mouth., Disp: , Rfl:    methocarbamol (ROBAXIN) 750 MG tablet, Take 750 mg by mouth., Disp: , Rfl:    MOUNJARO 10 MG/0.5ML Pen, Inject into the skin., Disp: , Rfl:    spironolactone (ALDACTONE) 25 MG tablet, Take 1 tablet by mouth daily., Disp: , Rfl:    topiramate (TOPAMAX) 25 MG tablet, Take 50 mg by mouth., Disp: , Rfl:    amLODipine  (NORVASC ) 10 MG tablet, Take 1 tablet (10 mg total) by mouth daily., Disp: 90 tablet, Rfl: 1   Biotin 10000 MCG TABS, Take by mouth., Disp: , Rfl:    cetirizine (ZYRTEC) 5 MG tablet, Take by mouth., Disp: , Rfl:    ciclopirox  (PENLAC ) 8 % solution, Apply topically at bedtime. Apply over nail and surrounding skin. Apply daily over previous coat. After seven (7) days, may remove with alcohol and continue cycle., Disp: 6.6 mL, Rfl: 2   clobetasol (TEMOVATE) 0.05 % external solution, Apply topically to scalp twice daily., Disp: , Rfl:    ferrous sulfate 325 (65 FE) MG EC tablet, Take 325 mg by mouth 3 (three) times daily with meals., Disp: , Rfl:    fexofenadine (ALLEGRA) 60 MG tablet, Take by mouth., Disp: , Rfl:    gabapentin  (NEURONTIN ) 300 MG  capsule, Take 2 capsules (600 mg total) by mouth daily as needed (shingles pain)., Disp: 30 capsule, Rfl: 1   hydrochlorothiazide  (HYDRODIURIL ) 25 MG tablet, Take 1 tablet (25 mg total) by mouth daily., Disp: 90 tablet, Rfl: 1   JARDIANCE 10 MG TABS tablet, Take 10 mg by mouth daily., Disp: , Rfl:    ketoconazole (NIZORAL) 2 % cream, Apply 1 Application topically 2 (two) times daily., Disp: , Rfl:    loratadine (CLARITIN) 10 MG tablet, Take 10 mg by mouth., Disp: , Rfl:    losartan  (COZAAR ) 100 MG tablet, Take 1 tablet (100 mg total) by mouth daily., Disp: 90 tablet, Rfl: 1   losartan -hydrochlorothiazide  (HYZAAR) 100-25 MG tablet, Take 1 tablet by mouth daily., Disp: , Rfl:    metFORMIN  (GLUCOPHAGE -XR) 500 MG 24 hr tablet, Take 500 mg by mouth 3 (three) times daily., Disp: , Rfl:    Multiple Vitamin (MULTIVITAMIN) tablet, Take 1 tablet by mouth daily., Disp: , Rfl:    mupirocin ointment (BACTROBAN) 2 %, APPLY OINTMENT TOPICALLY TO AFFECTED AREA TWICE DAILY FOR 7 DAYS, Disp: , Rfl:    Secukinumab 150 MG/ML SOAJ, INJECT THE CONTENTS OF 2 PENS (300 MG) ONCE WEEKLY AT WEEKS 0, 1, 2, 3, AND 4. THEN INJECT 2 PENS (300 MG)  EVERY 4 WEEKS, Disp: , Rfl:    Semaglutide,0.25 or 0.5MG /DOS, (OZEMPIC, 0.25 OR 0.5 MG/DOSE,) 2 MG/1.5ML SOPN, , Disp: , Rfl:    Semaglutide-Weight Management (WEGOVY) 1 MG/0.5ML SOAJ, Inject into the skin., Disp: , Rfl:    simvastatin  (ZOCOR ) 20 MG tablet, Take 1 tablet (20 mg total) by mouth at bedtime., Disp: 90 tablet, Rfl: 1   Tirzepatide (MOUNJARO [place]), , Disp: , Rfl:    triamcinolone  cream (KENALOG ) 0.1 %, Apply topically 2 (two) times daily as needed., Disp: , Rfl:    triamcinolone  cream (KENALOG ) 0.5 %, Apply 1 application topically 3 (three) times daily., Disp: 454 g, Rfl: 5   vitamin E 1000 UNIT capsule, Take 1,000 Units by mouth daily., Disp: , Rfl:   Social History   Tobacco Use  Smoking Status Never  Smokeless Tobacco Never    Allergies  Allergen Reactions    Lisinopril Cough   Penicillins    Latex Rash    Other reaction(s): UNKNOWN Other reaction(s): UNKNOWN Other reaction(s): UNKNOWN    Objective:  There were no vitals filed for this visit. There is no height or weight on file to calculate BMI. Constitutional Well developed. Well nourished.  Vascular Dorsalis pedis pulses palpable bilaterally. Posterior tibial pulses palpable bilaterally. Capillary refill normal to all digits.  No cyanosis or clubbing noted. Pedal hair growth normal.  Neurologic Normal speech. Oriented to person, place, and time. Epicritic sensation to light touch grossly present bilaterally.  Dermatologic Painful ingrowing nail at lateral nail borders of the hallux nail left. No other open wounds. No skin lesions.  Orthopedic: Normal joint ROM without pain or crepitus bilaterally. No visible deformities. No bony tenderness.   Radiographs: None Assessment:   1. Ingrown left big toenail    Plan:  Patient was evaluated and treated and all questions answered.  Ingrown Nail, left -Patient elects to proceed with minor surgery to remove ingrown toenail removal today. Consent reviewed and signed by patient. -Ingrown nail excised. See procedure note. -Educated on post-procedure care including soaking. Written instructions provided and reviewed. -Patient to follow up in 2 weeks for nail check.  Procedure: Excision of Ingrown Toenail Location: Left 1st toe lateral nail borders. Anesthesia: Lidocaine 1% plain; 1.5 mL and Marcaine 0.5% plain; 1.5 mL, digital block. Skin Prep: Betadine. Dressing: Silvadene; telfa; dry, sterile, compression dressing. Technique: Following skin prep, the toe was exsanguinated and a tourniquet was secured at the base of the toe. The affected nail border was freed, split with a nail splitter, and excised. Chemical matrixectomy was then performed with phenol and irrigated out with alcohol. The tourniquet was then removed and sterile dressing  applied. Disposition: Patient tolerated procedure well. Patient to return in 2 weeks for follow-up.   No follow-ups on file.

## 2023-08-27 NOTE — Patient Instructions (Signed)

## 2023-08-28 NOTE — Unmapped (Unsigned)
 [facility] PT [facility]  OUTPATIENT PHYSICAL THERAPY  08/28/2023          Patient Name: Tracy Robles  Date of Birth:1965-09-04  Diagnosis:   No diagnosis found.      Referring Provider: Rolande Cleverly    Date of Onset of Impairment: 01/02/2023  Date PT Care Plan Established or Reviewed: 03/26/2023  Date PT Treatment Started: 03/26/2023     Plan of Care Effective Date: (4/5 auth until 08/28/23)    Session Number:  Visit count could not be calculated. Make sure you are using a visit which is associated with an episode.     ASSESSMENT & PLAN   Assessment  Assessment details:    Tracy Robles presents for PT follow up with continued bilateral low back pain with movement coordination impairments. *** Tracy Robles will continue to benefit from skilled Physical Therapy intervention to address the mild impairments listed below and to maximize her functional independence and safety.       Evaluation: Tracy Robles is a 58 y.o. female presents with bilateral low back dysfunction which is limiting her ability to perform the functional activities listed below.  Based on patient's presentation in clinic today, signs and symptoms appear to be consistent with low back pain with movement coordination impairments with general deconditioning.  The patient presents with associated impairments outlined below impacting her current functional status as outlined below.  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  I anticipate the patient will require the specified duration and frequency of care to achieve her goals for rehab to return this more desirable level of function.          Impairments: pain, decreased strength, decreased range of motion, decreased knowledge of self-care and impaired ADLs    Other impairment: limited understanding of the rehabilitation process, exercise progression, and self-care    Personal Factors/Comorbidities: 1-2    Specific Comorbidities: HTN, DM-II, Obesity      Clinical Presentation: stable    Clinical Decision Making: low    Prognosis: excellent prognosis    Positive Prognosis Rationale: motivated for treatment and strength.  Negative Prognosis Rationale: caregiver/family support, Pain Status and chronicity of condition.      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  2) Pt will improve to >80 related norms to demonstrate improved LE endurance and gait speed.   3) Pt will demonstrate full, pain free ROM of lumbar extension to allow for improved walking tolerance with rotational movement.  4) Pt will be able to stand for >45 min to demonstrate improved activity tolerance.       Plan    Therapy options: will be seen for skilled physical therapy services    Planned therapy interventions: 97110-Therapeutic Exercises, 97112-Neuromuscular Re-education, 97140-Manual Therapy, 97164-Re-evaluation, 97530-Therapeutic Activities, 97535-Self-Care/Home Training and 97116-Gait Training    Other planned therapy interventions: Therapeutic Activity, Self-Care/Home Management Training, and all PT modalities as indicated      Frequency: 1x week (tapering frequency towards independence)    Duration in weeks: 10    Education provided to: patient.    Education provided: Treatment options and plan and HEP  Education results: needs reinforcement and needs further instruction.        Total Session Time: equates to total treatment time unless otherwise noted.          SUBJECTIVE       History of Present Condition     History of Present Condition/Chief Complaint:  M54.50,G89.29 (ICD-10-CM) - Chronic bilateral low back pain without sciatica  M43.16 (ICD-10-CM) - Spondylolisthesis at L4-L5 level    Long history low back pain.  Right side worse than left.  Shooting pain down left leg over thigh occurring intermittent.  Primary pain right-sided axial low back pain.  Does note intermittent buttock pain.  Pain consistently worse with sitting for extended periods.  Denies any current radicular pain/paresthesia.     Functional limitations include: pain with sitting for prolonged periods    Subjective:  Stressful day. ***  Pain:     Current pain rating:  0    At best pain rating:  0    At worst pain rating:  5  Location:  Lower part of back is sharp, hip is vibrating and sharp    Quality:  Aching, tingling, sharp and stabbing    Relieving factors:  Rest and medications (Stretching)    Aggravating factors:  Standing, walking, stairs, sitting and lying  Precautions/Equipment   Precautions:  None    Current Braces/Orthoses:  None    Equipment Currently Used:  None  Prior Functional Status     No physical limitations    Current Functional Status:    disturbed sleep, limited lifting, limited standing tolerance, limited walking tolerance, limited sitting tolerance, limited bending, 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    Grade 1 anterolisthesis of L3 on L4. Mild left lateral listhesis of L3 on L4.    Mild to moderate multilevel degenerative changes. Findings are slightly progressed since 11/18/2018      Treatments:     Previous treatment:  Physical therapy  Patient Goals:     Patient/Family goals for therapy:  Increased strength, improved standing tolerance, improved sleep, improved sitting tolerance, improved ambulation and decreased pain    Other patient goals:  Increase driving time, cooking, standing 60+        OBJECTIVE     Outcome Measure: Revised Oswestry: Oswestry Score: 21 / 50 or 42 %     Posture/Observation:   Standing: unremarkable  Gait: non-antalgic  BIL hip drop L>R    Lumbar Spine ROM  Motion No ROM Loss  (0%) Min ROM Loss  (1-33%) Mod ROM Loss  (34-65%) Major ROM Loss  (66-100%) Symptoms    Flexion  X   Less pain feels good   Extension   x  Pain lower lumbar   Sidebend Right  X   Pain with overpressure   Side bend Left  X      Rotation R  X   No pain; OP not tested   Rotation L   X   No pain; OP not tested       Function:  30s STS (mat table 17 inches, no UE assist):   10 reps (rocking noted)  9 reps (without rocking, some LBP with rise and lower) with increased forward flexion  2 minute walk test: 366 ft  With no assistive  device, 66% of norm 1.5 min in with pain (eval)    05/21/23  : 341 ft, mild LBP pain at 1:20 minutes        TREATMENT RENDERED     Latex allergy **     Therapeutic Exercise: 25 Minutes   Performed with direct PT demonstration, instruction, supervision, and guidance.   - Reassessment of symptoms    - reassess or discharge ?     - nustep L3 x5 min   - HEP review as below     Program Notes  Increase walking throughout the day as able (ie  parking spot a little farther away from the store, walk on the weekend)Perform either leg press or wall squat    Exercises  - Recumbent Bike  - 1 x daily - 2 x weekly - 15 minute hold  - Full Leg Press  - 1 x daily - 2 x weekly - 2 sets - 10 reps - 20lb hold  - Wall Quarter Squat  - 3 x weekly - 2 sets - 10 reps  - Seated Trunk Rotation - Arms Crossed  - 1 x daily - 7 x weekly - 2 sets - 10 reps  - Seated Flexion Stretch  - 5 x daily - 1 sets - 5 reps - 20-30s hold  - Standing Hip Abduction with Counter Support  - 1 x daily - 3 x weekly - 2 sets - 8-10 reps  - Standing March  - 1 x daily - 3 x weekly - 3 sets - 30-60 sec hold  - Sitting Knee Extension with Resistance  - 1 x daily - 3 x weekly - 2 sets - 8-10 reps - orange hold    Goal: join the gym before next session (waiting on HR paperwork)       Next Visit Plan: check in on gym compliance - has access to gym at work  (leg press and bike)   core stabilization exercises not in supine or hooklying (plank, pallof), glute strengthening, quad strengthening, generals strength/conditioning      Total Treatment Time: *** Minutes                           I attest that I have reviewed the above information.  Signed: Axel Meas K Elston Aldape, PT, DPT  08/28/2023 9:24 AM        I reviewed the no-show/attendance policy with the patient and caregiver(s). The patient is aware that they must call to cancel appointments more than 24 hours in advance. They are also aware that if they late cancel or no-show three times, we reserve the right to cancel their remaining appointments. This policy is in place to allow us  to best serve the needs of our caseload.    If patient returns to clinic with variance in plan of care, then it may be attributable to one or more of the following factors: preferred clinician availability, appointment time request availability, therapy pool appointment availability, major holiday with clinic closure, caregiver availability, patient transportation, conflicting medical appointment, inclement weather, and/or patient illness.    If patient does not return for follow up visit(s) related to this episode of care, this note will serve as their discharge note from Physical Therapy.    This care was provided during physical therapy residency mentored training.  I was present throughout and participated in the examination, assessment, treatment and plan of care.

## 2023-08-28 NOTE — Unmapped (Signed)
 [facility] PHYSICAL THERAPY  [facility]  [address] [place], [place] [address]    [phone]    This note is to let you know that Tracy Robles did not show for their scheduled Physical Therapy follow-up session. Please contact me if you have any questions or concerns.    Thank you for this referral,     Signed: Kailer Heindel K Duru Reiger, PT  08/28/2023 11:30 AM

## 2023-08-28 NOTE — Unmapped (Signed)
 [facility] Special[facility] and Home Delivery Pharm[facility] Refill Coordination Note    Specialty Medication(s) to be Shipped:   Inflammatory Disorders: Cosentyx    Other medication(s) to be shipped: No additional medications requested for fill at this time     Tracy Robles, DOB: [birth year]  Phone: There are no phone numbers on file.      All above HIPAA information was verified with patient.     Was a Nurse, learning disability used for this call? No    Completed refill call assessment today to schedule patient's medication shipment from the [facility]  [phone]).  All relevant notes have been reviewed.     Specialty medication(s) and dose(s) confirmed: Regimen is correct and unchanged.   Changes to medications: Violia reports no changes at this time.  Changes to insurance: No  New side effects reported not previously addressed with a pharmacist or physician: None reported  Questions for the pharmacist: No    Confirmed patient received a Conservation officer, historic buildings and a Surveyor, mining with first shipment. The patient will receive a drug information handout for each medication shipped and additional FDA Medication Guides as required.       DISEASE/MEDICATION-SPECIFIC INFORMATION        For patients on injectable medications: Patient currently has 0 doses left.  Next injection is scheduled for 09/09/2023.    SPECIALTY MEDICATION ADHERENCE     Medication Adherence    Patient reported X missed doses in the last month: 0  Specialty Medication: Cosentyx 150 mg/mL  Patient is on additional specialty medications: No  Informant: patient     Were doses missed due to medication being on hold? No    Cosetnyx 150 mg/mL: 0 doses of medicine on hand       REFERRAL TO PHARMACIST     Referral to the pharmacist: Not needed      [facility]     Shipping address confirmed in Epic.     Cost and Payment: Patient has a $0 copay, payment information is not required.    Delivery Scheduled: Yes, Expected medication delivery date: 09/04/2023.     Medication will be delivered via Same Day Courier to the prescription address in Epic [place].    Tracy Robles   [place] [facility]  Specialty Technician

## 2023-09-03 DIAGNOSIS — L409 Psoriasis, unspecified: Principal | ICD-10-CM

## 2023-09-04 MED FILL — COSENTYX PEN 300 MG/2 PENS (150 MG/ML) SUBCUTANEOUS PEN INJECTOR: SUBCUTANEOUS | 28 days supply | Qty: 2 | Fill #6

## 2023-09-18 ENCOUNTER — Ambulatory Visit: Admit: 2023-09-18 | Payer: BLUE CROSS/BLUE SHIELD

## 2023-09-18 NOTE — Unmapped (Signed)
 Pls advise.

## 2023-09-28 NOTE — Unmapped (Signed)
 [facility] Specialty and Home Delivery Pharmacy Refill Coordination Note    Specialty Medication(s) to be Shipped:   Inflammatory Disorders: Cosentyx    Other medication(s) to be shipped: No additional medications requested for fill at this time     Alvah Gilder, DOB: 08-04-1965  Phone: There are no phone numbers on file.      All above HIPAA information was verified with patient.     Was a Nurse, learning disability used for this call? No    Completed refill call assessment today to schedule patient's medication shipment from the [facility]  [phone]).  All relevant notes have been reviewed.     Specialty medication(s) and dose(s) confirmed: Regimen is correct and unchanged.   Changes to medications: Zain reports no changes at this time.  Changes to insurance: No  New side effects reported not previously addressed with a pharmacist or physician: None reported  Questions for the pharmacist: No    Confirmed patient received a Conservation officer, historic buildings and a Surveyor, mining with first shipment. The patient will receive a drug information handout for each medication shipped and additional FDA Medication Guides as required.       DISEASE/MEDICATION-SPECIFIC INFORMATION        For patients on injectable medications: Patient currently has 0 doses left.  Next injection is scheduled for 10/07/2023.    SPECIALTY MEDICATION ADHERENCE     Medication Adherence    Patient reported X missed doses in the last month: 0  Specialty Medication: secukinumab (COSENTYX PEN, 2 PENS,) 150 mg/mL PnIj injection  Patient is on additional specialty medications: No  Informant: patient     Were doses missed due to medication being on hold? No    Cosetnyx 150 mg/mL: 0 doses of medicine on hand       REFERRAL TO PHARMACIST     Referral to the pharmacist: Not needed      [facility]     Shipping address confirmed in Epic.     Cost and Payment: Patient has a $0 copay, payment information is not required.    Delivery Scheduled: Yes, Expected medication delivery date: 10/06/2023.     Medication will be delivered via Same Day Courier to the prescription address in Epic [place].    Jeoffrey JAYSON Sherra [facility]

## 2023-10-02 DIAGNOSIS — M545 Chronic bilateral low back pain without sciatica: Principal | ICD-10-CM

## 2023-10-02 DIAGNOSIS — G8929 Other chronic pain: Principal | ICD-10-CM

## 2023-10-02 DIAGNOSIS — M4316 Spondylolisthesis, lumbar region: Principal | ICD-10-CM

## 2023-10-02 MED ORDER — MELOXICAM 15 MG TABLET
ORAL_TABLET | Freq: Every day | ORAL | 0 refills | 30.00000 days | Status: CP | PRN
Start: 2023-10-02 — End: ?

## 2023-10-06 MED FILL — COSENTYX PEN 300 MG/2 PENS (150 MG/ML) SUBCUTANEOUS PEN INJECTOR: SUBCUTANEOUS | 28 days supply | Qty: 2 | Fill #7

## 2023-10-17 NOTE — Unmapped (Signed)
 Initialed PA on CMM; KEY: [id number] Pending: Approval/Denial     Will make patient aware of PA status via MyChart message.

## 2023-11-04 NOTE — Unmapped (Signed)
 [facility] Special[facility] and Home Delivery Pharmacy Refill Coordination Note    Specialty Medication(s) to be Shipped:   Inflammatory Disorders: Cosentyx     Other medication(s) to be shipped: No additional medications requested for fill at this time     Tracy Robles, DOB: Aug 27, 1965  Phone: There are no phone numbers on file.      All above HIPAA information was verified with patient.     Was a Nurse, learning disability used for this call? No    Completed refill call assessment today to schedule patient's medication shipment from the [facility] and [facility] Pharm[facility]  [phone]).  All relevant notes have been reviewed.     Specialty medication(s) and dose(s) confirmed: Regimen is correct and unchanged.   Changes to medications: [name] reports no changes at this time.  Changes to insurance: No  New side effects reported not previously addressed with a pharmacist or physician: None reported  Questions for the pharmacist: No    Confirmed patient received a Conservation officer, historic buildings and a Surveyor, mining with first shipment. The patient will receive a drug information handout for each medication shipped and additional FDA Medication Guides as required.       DISEASE/MEDICATION-SPECIFIC INFORMATION        For patients on injectable medications: Patient currently has 0 doses left.  Next injection is scheduled for 11/09/23.    SPECIALTY MEDICATION ADHERENCE     Medication Adherence    Patient reported X missed doses in the last month: 0  Specialty Medication: secukinumab  (COSENTYX  PEN, 2 PENS,) 150 mg/mL PnIj injection  Patient is on additional specialty medications: No  Patient is on more than two specialty medications: No  Any gaps in refill history greater than 2 weeks in the last 3 months: no  Demonstrates understanding of importance of adherence: yes              Were doses missed due to medication being on hold? No    COSENTYX  PEN (2 PENS) 150 mg/ml: 0 days of medicine on hand       REFERRAL TO PHARMACIST     Referral to the pharmacist: Not needed      [facility]     Shipping address confirmed in Epic.     Cost and Payment: Patient has a $0 copay, payment information is not required.    Delivery Scheduled: Yes, Expected medication delivery date: 11/06/23.     Medication will be delivered via Same Day Courier to the prescription address in Epic [facility].    Kyra Myron   [facility]  Specialty Technician

## 2023-11-06 MED FILL — COSENTYX PEN 300 MG/2 PENS (150 MG/ML) SUBCUTANEOUS PEN INJECTOR: SUBCUTANEOUS | 28 days supply | Qty: 2 | Fill #8

## 2023-11-26 DIAGNOSIS — E66813 Class 3 severe obesity with serious comorbidity and body mass index (BMI) of 50.0 to 59.9 in adult, unspecified obesity type (CMS-HCC): Principal | ICD-10-CM

## 2023-11-26 DIAGNOSIS — H9311 Tinnitus, right ear: Principal | ICD-10-CM

## 2023-11-26 DIAGNOSIS — E1165 Type 2 diabetes mellitus with hyperglycemia: Principal | ICD-10-CM

## 2023-11-26 DIAGNOSIS — Z6841 Body Mass Index (BMI) 40.0 and over, adult: Principal | ICD-10-CM

## 2023-11-26 DIAGNOSIS — I10 Essential (primary) hypertension: Principal | ICD-10-CM

## 2023-11-26 MED ORDER — BLOOD-GLUCOSE SENSOR DEVICE
ORAL | 6 refills | 0.00000 days | Status: CP
Start: 2023-11-26 — End: ?

## 2023-11-26 MED ORDER — EMPAGLIFLOZIN 10 MG TABLET
ORAL_TABLET | Freq: Every day | ORAL | 3 refills | 90.00000 days | Status: CP
Start: 2023-11-26 — End: ?

## 2023-11-26 MED ORDER — MOUNJARO 12.5 MG/0.5 ML SUBCUTANEOUS PEN INJECTOR
SUBCUTANEOUS | 4 refills | 0.00000 days | Status: CP
Start: 2023-11-26 — End: ?

## 2023-11-27 NOTE — Unmapped (Signed)
 LVM for patient letter for pick up and in my chart

## 2023-11-27 NOTE — Unmapped (Signed)
 Copied from CRM #[id number]. Topic: Access To Clinicians - Req Clinic Call Back  >> Nov 27, 2023  9:55 AM Summer P wrote:  Patient states that she needs a doctors note printed and she states she is coming to pick one up after 10 am this morning for her appointment yesterday with Dr. Servando. Asked patient to wait for communication first in concerns to this and then she stated that they just normally print one for her at the [facility] and that she is coming in.

## 2023-11-27 NOTE — Unmapped (Signed)
 Patient called back to state if she can get work note by 5pm today that would be great.

## 2023-12-03 ENCOUNTER — Encounter: Payer: Self-pay | Admitting: Podiatry

## 2023-12-03 ENCOUNTER — Ambulatory Visit: Admitting: Podiatry

## 2023-12-03 VITALS — Ht 64.0 in | Wt 350.0 lb

## 2023-12-03 DIAGNOSIS — L600 Ingrowing nail: Secondary | ICD-10-CM

## 2023-12-03 NOTE — Unmapped (Signed)
 INTERIM HISTORY:  Ms. Chenette returns today for evaluation of her left wrist. She was last seen on 05/12/2023 for injection of right first dorsal compartment.  She has had significant relief from that injection and does not have any complaints about her right side at this time.      She returns today due to pain in the left wrist.  She noticed that her left radial wrist started hurting a few month ago.  She went to [facility] and had a left radial styloid tenosynovium injection on 06/12/2023.  She states that she has had another injection but she cannot recall the timing of this and there are no EMR documentation that is available to us  regarding the second injection.  She states that this injection was helpful though unfortunately her symptoms have returned.  Denies significant complaints of numbness or tingling.    PHYSICAL EXAMINATION:  EXTREMITIES:    - Examination of the left upper extremity shows she is neuovascularly intact with normal sensation and strength throughout.  Skin is intact.  Fingers are warm and well perfused with brisk capillary refill and she has a palpable radial pulse.  There is tenderness to palpation about the thumb first dorsal compartment.  Nontender at the thumb [facility] joint.  She has a positive Finkelstein's.  Nontender at the dorsal wrist.  Nontender at the thumb MP joint both dorsally and volarly.     IMAGING:  No relevant orthopedic imaging    ASSESSMENT:   Left first dorsal compartment tendinitis.  Recurrent symptoms status post injection 06/12/2023 at [facility]  Right first dorsal compartment tendinitis - resolved since injection 05/12/2023  EMG-proven mild left carpal tunnel syndrome   Left fourth dorsal compartment tendonitis - resolved since injection 07/01/2021.    PLAN:  Ms. Peyser and I discussed her symptoms and treatment options.  Her pain seems to be localized over the first dorsal compartment.  I think that she likely has first dorsal compartment tendinitis.  She had a previous injection done at another hospital.  We discussed options including continued observation, injection, and surgical release.  She is interested in another injection and I think that an ultrasound guided first dorsal compartment injection would be best to ensure exact localization of the injection which she would like to proceed with.  We will make this referral today.  We also provided her with a splint.  She will return to see me on an as-needed basis.     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

## 2023-12-03 NOTE — Unmapped (Signed)
 Assessment and Plan:     Assessment & Plan  Type 2 diabetes mellitus with hyperglycemia  Type 2 diabetes is well-controlled with A1c of 5.4. Jardiance  and Mounjaro  are effective for glycemic control and weight loss.  - Continue Jardiance  10 mg oral daily.  - Continue Mounjaro  12.5 mg subcutaneous weekly.  - Send prescription for Dexcom CGM to [facility].  - Check A1c in four months.    Primary hypertension  Hypertension is well-controlled with blood pressure at 123/80 mmHg. Spironolactone  is the only tolerated medication.  - Continue spironolactone  25 mg oral daily.    Class 3 severe obesity (BMI 50.0-59.9)  She has lost 12 pounds since March and over 100 pounds since 2000. Lifestyle changes are contributing to weight loss. She declined bariatric surgery.  - Continue lifestyle modifications including reduced soda and fast food intake.  - Encourage resistance exercises and adequate protein intake for muscle toning.    Tinnitus, right ear  Reports faint ringing in right ear without hearing impact. No significant earwax buildup.  - Refer to ENT for further evaluation of tinnitus.               PHQ-2 Score: 0    PHQ-9 Score:      Edinburgh Score:      Screening complete, no depression identified / no further action needed today      I personally spent 30 minutes face-to-face and non-face-to-face in the care of this patient, which includes all pre, intra, and post visit time (reviewing, evaluating and discussing pertinent records for patient's care) on the date of service.       Return in about 4 months (around 03/27/2024) for Recheck, cdm.    Subjective:     HPI: Tracy Robles is a 58 y.o. female here for        History of Present Illness  Tracy Robles is a 58 year old female with hypertension and type 2 diabetes who presents for a follow-up visit.    She is not consistently taking her blood pressure medications, specifically losartan  and amlodipine , due to side effects such as frequent urination. She is currently only taking spironolactone .    She has lost 12 pounds since March, attributing this to dietary changes, including reducing soda intake and fast food consumption. She has lost over 100 pounds since 2000, previously weighing 385 pounds. She is currently on Mounjaro  12.5 mg weekly, which helps curb her appetite.    She experiences ringing in her right ear, described as a faint ring, and a sensation similar to having water in the ear. This sensation worsens when lying on her side.    She occasionally takes topiramate  for shoulder pain but has reduced its use as her shoulder pain has decreased. She also takes multivitamins, probiotics, and a calcium supplement for her fingernails.    She reports feeling sleepy in the mornings, particularly after eating, and is interested in using a continuous glucose monitor to track her blood sugar levels.         ROS:   Review of Systems     Review of systems negative unless otherwise noted as per HPI.      The following portions of the patient's history were reviewed and updated as appropriate: allergies, current medications, past family history, past medical history, past social history, past surgical history and problem list.     Objective:     Vitals:    11/26/23 1600   BP: 123/80   Pulse:  88   Temp: 36.7 ??C (98 ??F)     Body mass index is 50.8 kg/m??.    Physical Exam  Vitals and nursing note reviewed.   Constitutional:       General: She is not in acute distress.     Appearance: Normal appearance. She is not ill-appearing, toxic-appearing or diaphoretic.   Cardiovascular:      Rate and Rhythm: Normal rate and regular rhythm.      Heart sounds: Normal heart sounds.   Pulmonary:      Effort: Pulmonary effort is normal. No respiratory distress.      Breath sounds: Normal breath sounds.   Musculoskeletal:      Cervical back: Neck supple.   Neurological:      Mental Status: She is alert.                Results  LABS    Hemoglobin A1c: 5.4%         Allergies:     Lisinopril, Latex, and Penicillins      THEODORO JUST, MD    [facility]:     Medication adherence and barriers to the treatment plan have been addressed. Opportunities to optimize healthy behaviors have been discussed. Patient / caregiver voiced understanding.

## 2023-12-03 NOTE — Patient Instructions (Signed)

## 2023-12-03 NOTE — Progress Notes (Signed)
 Subjective:  Patient ID: Julia Snow, female    DOB: 14-Feb-1966,  MRN: [id number]  Chief Complaint  Patient presents with   Ingrown Toenail    Pt is here to have ingrown removed from left great toenail.    58 y.o. female presents with the above complaint.  Patient presents with left medial border ingrown painful to touch.  The lateral side is doing great she wanted to do the medial side now.  Denies any other acute complaints pain scale 7 out of 10 dull aching nature   Review of Systems: Negative except as noted in the HPI. Denies N/V/F/Ch.  Past Medical History:  Diagnosis Date   Allergy    Hypertension    Miscarriage 2005   Sleep apnea    does not wear CPAP    Current Outpatient Medications:    amLODipine  (NORVASC ) 10 MG tablet, Take 1 tablet (10 mg total) by mouth daily., Disp: 90 tablet, Rfl: 1   Biotin 10000 MCG TABS, Take by mouth., Disp: , Rfl:    cetirizine (ZYRTEC) 5 MG tablet, Take by mouth., Disp: , Rfl:    ciclopirox  (PENLAC ) 8 % solution, Apply topically at bedtime. Apply over nail and surrounding skin. Apply daily over previous coat. After seven (7) days, may remove with alcohol and continue cycle., Disp: 6.6 mL, Rfl: 2   clobetasol (TEMOVATE) 0.05 % external solution, Apply topically to scalp twice daily., Disp: , Rfl:    diclofenac Sodium (VOLTAREN) 1 % GEL, Apply 1 g topically., Disp: , Rfl:    ferrous sulfate 325 (65 FE) MG EC tablet, Take 325 mg by mouth 3 (three) times daily with meals., Disp: , Rfl:    fexofenadine (ALLEGRA) 60 MG tablet, Take by mouth., Disp: , Rfl:    gabapentin  (NEURONTIN ) 300 MG capsule, Take 2 capsules (600 mg total) by mouth daily as needed (shingles pain)., Disp: 30 capsule, Rfl: 1   hydrochlorothiazide  (HYDRODIURIL ) 25 MG tablet, Take 1 tablet (25 mg total) by mouth daily., Disp: 90 tablet, Rfl: 1   JARDIANCE 10 MG TABS tablet, Take 10 mg by mouth daily., Disp: , Rfl:    ketoconazole (NIZORAL) 2 % cream, Apply 1 Application topically  2 (two) times daily., Disp: , Rfl:    loratadine (CLARITIN) 10 MG tablet, Take 10 mg by mouth., Disp: , Rfl:    losartan  (COZAAR ) 100 MG tablet, Take 1 tablet (100 mg total) by mouth daily., Disp: 90 tablet, Rfl: 1   losartan -hydrochlorothiazide  (HYZAAR) 100-25 MG tablet, Take 1 tablet by mouth daily., Disp: , Rfl:    meloxicam (MOBIC) 15 MG tablet, Take 15 mg by mouth., Disp: , Rfl:    metFORMIN  (GLUCOPHAGE -XR) 500 MG 24 hr tablet, Take 500 mg by mouth 3 (three) times daily., Disp: , Rfl:    methocarbamol (ROBAXIN) 750 MG tablet, Take 750 mg by mouth., Disp: , Rfl:    MOUNJARO 10 MG/0.5ML Pen, Inject into the skin., Disp: , Rfl:    Multiple Vitamin (MULTIVITAMIN) tablet, Take 1 tablet by mouth daily., Disp: , Rfl:    mupirocin ointment (BACTROBAN) 2 %, APPLY OINTMENT TOPICALLY TO AFFECTED AREA TWICE DAILY FOR 7 DAYS, Disp: , Rfl:    Secukinumab 150 MG/ML SOAJ, INJECT THE CONTENTS OF 2 PENS (300 MG) ONCE WEEKLY AT WEEKS 0, 1, 2, 3, AND 4. THEN INJECT 2 PENS (300 MG) EVERY 4 WEEKS, Disp: , Rfl:    Semaglutide,0.25 or 0.5MG /DOS, (OZEMPIC, 0.25 OR 0.5 MG/DOSE,) 2 MG/1.5ML SOPN, , Disp: , Rfl:  Semaglutide-Weight Management (WEGOVY) 1 MG/0.5ML SOAJ, Inject into the skin., Disp: , Rfl:    simvastatin  (ZOCOR ) 20 MG tablet, Take 1 tablet (20 mg total) by mouth at bedtime., Disp: 90 tablet, Rfl: 1   spironolactone (ALDACTONE) 25 MG tablet, Take 1 tablet by mouth daily., Disp: , Rfl:    Tirzepatide (MOUNJARO [place]), , Disp: , Rfl:    topiramate (TOPAMAX) 25 MG tablet, Take 50 mg by mouth., Disp: , Rfl:    triamcinolone  cream (KENALOG ) 0.1 %, Apply topically 2 (two) times daily as needed., Disp: , Rfl:    triamcinolone  cream (KENALOG ) 0.5 %, Apply 1 application topically 3 (three) times daily., Disp: 454 g, Rfl: 5   vitamin E 1000 UNIT capsule, Take 1,000 Units by mouth daily., Disp: , Rfl:   Social History   Tobacco Use  Smoking Status Never  Smokeless Tobacco Never    Allergies  Allergen  Reactions   Lisinopril Cough   Penicillins    Latex Rash    Other reaction(s): UNKNOWN Other reaction(s): UNKNOWN Other reaction(s): UNKNOWN    Objective:  There were no vitals filed for this visit. Body mass index is 60.08 kg/m. Constitutional Well developed. Well nourished.  Vascular Dorsalis pedis pulses palpable bilaterally. Posterior tibial pulses palpable bilaterally. Capillary refill normal to all digits.  No cyanosis or clubbing noted. Pedal hair growth normal.  Neurologic Normal speech. Oriented to person, place, and time. Epicritic sensation to light touch grossly present bilaterally.  Dermatologic Painful ingrowing nail at medial nail borders of the hallux nail left. No other open wounds. No skin lesions.  Orthopedic: Normal joint ROM without pain or crepitus bilaterally. No visible deformities. No bony tenderness.   Radiographs: None Assessment:   1. Ingrown left big toenail    Plan:  Patient was evaluated and treated and all questions answered.  Ingrown Nail, left -Patient elects to proceed with minor surgery to remove ingrown toenail removal today. Consent reviewed and signed by patient. -Ingrown nail excised. See procedure note. -Educated on post-procedure care including soaking. Written instructions provided and reviewed. -Patient to follow up in 2 weeks for nail check.  Procedure: Excision of Ingrown Toenail Location: Left 1st toe medial nail borders. Anesthesia: Lidocaine 1% plain; 1.5 mL and Marcaine 0.5% plain; 1.5 mL, digital block. Skin Prep: Betadine. Dressing: Silvadene; telfa; dry, sterile, compression dressing. Technique: Following skin prep, the toe was exsanguinated and a tourniquet was secured at the base of the toe. The affected nail border was freed, split with a nail splitter, and excised. Chemical matrixectomy was then performed with phenol and irrigated out with alcohol. The tourniquet was then removed and sterile dressing  applied. Disposition: Patient tolerated procedure well. Patient to return in 2 weeks for follow-up.   No follow-ups on file.

## 2023-12-04 ENCOUNTER — Ambulatory Visit
Admit: 2023-12-04 | Discharge: 2023-12-05 | Payer: BLUE CROSS/BLUE SHIELD | Attending: Orthopaedic Surgery | Primary: Orthopaedic Surgery

## 2023-12-08 ENCOUNTER — Inpatient Hospital Stay: Admit: 2023-12-08 | Discharge: 2023-12-09 | Payer: BLUE CROSS/BLUE SHIELD

## 2023-12-08 ENCOUNTER — Ambulatory Visit: Admit: 2023-12-08 | Discharge: 2023-12-09 | Payer: BLUE CROSS/BLUE SHIELD | Attending: Family | Primary: Family

## 2023-12-08 DIAGNOSIS — M25512 Pain in left shoulder: Principal | ICD-10-CM

## 2023-12-08 NOTE — Unmapped (Signed)
 SPORTS MEDICINE RETURN VISIT    ASSESSMENT AND PLAN      Diagnosis ICD-10-CM Associated Orders   1. 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  She prefers nonoperative management.  - Administered steroid injection to the left shoulder to reduce inflammation and provide pain relief.  -Home exercise program provided.  -Activity as tolerated with limitation for pain.  Over-the-counter analgesia with ice or heat as needed.      Return if symptoms worsen or fail to improve.    Procedure(s):  Left shoulder subacromial steroid injection      SUBJECTIVE     Chief Complaint: No chief complaint on file.      58 y.o. female     History of Present Illness  Tracy Robles is a 58 year old female who presents with worsening left shoulder pain.    She has been experiencing persistent left shoulder pain daily for several months, located lateral and anterior shoulder, which significantly disrupts her sleep, often waking her up at night. She frequently adjusts her shoulder and arm position to alleviate the pain and facilitate sleep. The patient reports that the pain is present daily and is affected by certain movements and positions, such as lying on her side. She often stretches her arm to find relief.    She recalls receiving a steroid injection in April 2022, which provided temporary relief. However, the pain has since returned and continues to impact her daily life, particularly her ability to care for her mother.    She is actively involved in caring for her mother, who requires assistance with dressing and other daily activities. This responsibility adds to her physical strain and limits her ability to rest and recover from her shoulder pain.      Past Medical History: Past Medical History[1]      OBJECTIVE Physical Exam:  Vitals:   Wt Readings from Last 3 Encounters:   11/26/23 (!) 126 kg (277 lb 12.8 oz)   07/07/23 (!) 131.5 kg (289 lb 12.8 oz)   05/14/23 (!) 127.1 kg (280 lb 3.2 oz)     Estimated body mass index is 50.8 kg/m?? as calculated from the following:    Height as of 04/10/23: 157.5 cm (5' 2.01).    Weight as of 11/26/23: 126 kg (277 lb 12.8 oz).  Gen: Well-appearing female in no acute distress  MSK: Left shoulder without obvious deformity.  Mild tenderness to palpation anterior shoulder diffusely.  Forward elevation 120 degrees with pain.  External rotation arms at side 60 degrees with pain.  Internal rotation L3 with pain.  Positive Hawkins and Neer's.  Rotator cuff strength is 4+/5 throughout with most pain in supraspinatus testing.  Neurovascular intact distally.  Skin is warm, dry and intact.  Physical Exam  MUSCULOSKELETAL: Limited range of motion in left shoulder. Normal motion and strength in right shoulder.      Imaging/other tests: X-rays of the left shoulder taken today and reviewed with patient in exam room show:  Mild acromioclavicular and glenohumeral osteoarthrosis.      Osseous sequela of chronic rotator cuff disease, acromiohumeral interval narrowing appears worse compared to prior exam suggesting worsening high-grade rotator cuff pathology.     Results      @  SPORTSPROMIS@      ADMINISTRATIVE     I have personally reviewed and interpreted the images (as available).  Point-of-care ultrasound imaging is on file and stored in a permanent location (if performed).  I have personally reviewed prior records and incorporated relevant information above (as available).    Note written with [employer].      @SMIBILLING @    PROCEDURES     Lg Joint Inj: L subacromial bursa on 12/08/2023 1:00 PM  Indications: pain  Details: 22 G needle, posterior approach  Laterality: left  Location: shoulder  Medications: 20 mg triamcinolone  acetonide 10 mg/mL  Outcome: tolerated well, no immediate complications  Procedure, treatment alternatives, risks and benefits explained, specific risks discussed. Consent was given by the patient. Immediately prior to procedure a time out was called to verify the correct patient, procedure, equipment, support staff and site/side marked as required. Patient was prepped and draped in the usual sterile fashion.     Medical Care Team Attestation: All ProcDoc orders were read back and verbally confirmed with the procedure provider, including but not limited to patient name, medication name, dose, and route, before any actions were taken.  Provider Attestation: The information documented by members of my medical care team was reviewed and verified for accuracy by me.           DME     DME ORDER:  Dx:  ,                             [1]   Past Medical History:  Diagnosis Date    Allergic Don't know    Penicillin    Arthritis 2011    Lower back    BMI 60.0-69.9, adult (CMS-HCC) 05/29/2021    Diabetes mellitus         Eczema     High blood pressure     Psoriasis

## 2023-12-08 NOTE — Unmapped (Signed)
 Plan: Activity as you tolerate with limitation from pain.  Please let me know if you do not have relief with the injection or if relief does not last at least 3 to 4 months.  Please contact me with any questions or concerns.  You received a corticosteroid injection to reduce pain and inflammation.  Please note that it can take up to 2 weeks for this injection to fully work.  While many people will feel relief sooner, please be patient.      The injection contained a corticosteroid and a numbing agent.  The numbing agent can last for 1-6 hours.  After this wears off you may have increased pain until the steroid has a chance to work.    What are some of the possible side effects of a steroid injection?    Common side effects:  temporarily elevated blood sugar (in diabetic patients) that can last a few days   flushing of the skin, especially the face  temporary rise in blood pressure  discoloration or atrophy of the skin at the injection site    Call your doctor at once if you have:  persistent worsening pain or swelling, fever;  blurred vision, tunnel vision, eye pain, or seeing halos around lights;  fast or slow heartbeats;  increased blood pressure that is associated with severe headache, blurred vision, pounding in your neck or ears, anxiety, nosebleed;  headaches, ringing in your ears, dizziness, nausea, vision problems, pain behind your eyes    This is not a complete list of side effects and others may occur. Call your provider for medical advice about side effects.     What other drugs may be affected after the injection?  Many drugs can interact with steroids. Not all possible interactions are listed here. Tell your doctor about all your current medicines and any you start or stop using, especially:  an antibiotic or antifungal medication;  birth control pills or hormone replacement therapy;  a blood thinner (warfarin, Coumadin, and others);  a diuretic or water pill;  insulin or oral diabetes medicine;  medicine to treat tuberculosis;  a nonsteroidal anti-inflammatory drug or NSAID (aspirin, ibuprofen, naproxen, diclofenac, indomethacin, Advil, Aleve, Celebrex, and many others); or  seizure medication.      You can resume your normal daily activities, but consider resting the injected area for the next few days.            Thank you for coming to [facility] and our clinic today!     We aim to provide you with the highest quality, individualized care.  If you have any unanswered questions after the visit, please do not hesitate to reach out to us  on MyChart or leave a message for the nurse.  ?  MyChart messages: These messages can be sent to your provider and will be checked by their clinical support staff.? The messages are checked throughout the day during normal business hours from 8:30 am-4:00 pm Monday-Friday, however responses may take up to 48 hours.? Please use this method of communication for non-urgent and non-emergent concerns, questions, refill requests or inquiries only.? ?Our team will help respond to all of your questions.? Please note that you may be asked to see a provider by either a telehealth or in person visit if it is deemed your questions are best handled in the clinic setting in person.??  ?  Please keep in mind, these messages are not real time communications, so be patient when waiting for  a response.    If you do not have access to MyChart, do not know how to use MyChart or have an issue that may require more extensive discussion, please call the nurses' call line: [phone].? This line is checked throughout the day and will be responded to as time allows.? Please note that return calls could take up to 48 hours, depending on the nature of the need.?  ?  If you have an issue that requires emergent attention that cannot wait; either call the Orthopaedics resident on call at [phone], consider coming to our [facility] walk-in clinic, or go to the nearest Emergency Department.    If you need to schedule future appointments, please call [phone].     We look forward to seeing you again in the future and appreciate you choosing [facility] for your care!    Thank you,                We provide innovative and comprehensive patient centered care that is supported by evidence-based research                                                                                                    RESEARCH PARTICIPATION    Please check out our current research studies to see if you or someone you know may qualify at:    [email or web address]

## 2023-12-16 NOTE — Unmapped (Signed)
 [facility] Refill Coordination Note    Specialty Medication(s) to be Shipped:   Inflammatory Disorders: Cosentyx     Other medication(s) to be shipped: No additional medications requested for fill at this time    Specialty Medications not needed at this time: N/A     Shene Maxfield, DOB: 09/07/1965  Phone: There are no phone numbers on file.      All above HIPAA information was verified with patient.     Was a Nurse, learning disability used for this call? No    Completed refill call assessment today to schedule patient's medication shipment from the [facility]  [phone]).  All relevant notes have been reviewed.     Specialty medication(s) and dose(s) confirmed: Regimen is correct and unchanged.   Changes to medications: Camil reports no changes at this time.  Changes to insurance: No  New side effects reported not previously addressed with a pharmacist or physician: Yes - Patient reports keeps flaring up in between shots. Patient would not like to speak to the pharmacist today. Their provider is aware.  Questions for the pharmacist: No    Confirmed patient received a Conservation officer, historic buildings and a Surveyor, mining with first shipment. The patient will receive a drug information handout for each medication shipped and additional FDA Medication Guides as required.       DISEASE/MEDICATION-SPECIFIC INFORMATION        For patients on injectable medications: Next injection is scheduled for 8.28.25.    SPECIALTY MEDICATION ADHERENCE     Medication Adherence    Patient reported X missed doses in the last month: 0  Specialty Medication: secukinumab  (COSENTYX  PEN, 2 PENS,) 150 mg/mL PnIj injection              Were doses missed due to medication being on hold? No      COSENTYX  PEN (2 PENS) 150 mg/mL Pnij injection (secukinumab ): 0 doses of medicine on hand       REFERRAL TO PHARMACIST     Referral to the pharmacist: Not needed      [facility]     Shipping address confirmed in Epic.     Cost and Payment: Patient has a $0 copay, payment information is not required.    Delivery Scheduled: Yes, Expected medication delivery date: 9.5.25.     Medication will be delivered via Same Day Courier to the prescription address in Epic WAM.    Doyal Hurst   [facility]  Specialty Technician

## 2023-12-18 MED FILL — COSENTYX PEN 300 MG/2 PENS (150 MG/ML) SUBCUTANEOUS PEN INJECTOR: SUBCUTANEOUS | 28 days supply | Qty: 2 | Fill #9

## 2023-12-22 ENCOUNTER — Ambulatory Visit: Admit: 2023-12-22 | Discharge: 2023-12-28 | Payer: BLUE CROSS/BLUE SHIELD

## 2023-12-22 ENCOUNTER — Inpatient Hospital Stay
Admit: 2023-12-22 | Discharge: 2023-12-28 | Disposition: A | Discharge status: Home / Self Care | Payer: BLUE CROSS/BLUE SHIELD

## 2023-12-22 ENCOUNTER — Encounter
Admit: 2023-12-22 | Discharge: 2023-12-28 | Payer: BLUE CROSS/BLUE SHIELD | Attending: Student in an Organized Health Care Education/Training Program

## 2023-12-22 LAB — AST: AST (SGOT): 17 U/L (ref <=34)

## 2023-12-22 LAB — URINALYSIS WITH MICROSCOPY WITH CULTURE REFLEX PERFORMABLE
BILIRUBIN UA: NEGATIVE
BLOOD UA: NEGATIVE
GLUCOSE UA: >1000 — AB
HYALINE CASTS: 6 /LPF — ABNORMAL HIGH (ref 0–1)
KETONES UA: 20 — AB
LEUKOCYTE ESTERASE UA: NEGATIVE
NITRITE UA: NEGATIVE
PH UA: 5.5 (ref 5.0–9.0)
PROTEIN UA: NEGATIVE
RBC UA: <1 /HPF (ref <=4)
SPECIFIC GRAVITY UA: 1.029 (ref 1.003–1.030)
SQUAMOUS EPITHELIAL: 1 /HPF (ref 0–5)
UROBILINOGEN UA: <2
WBC UA: 1 /HPF (ref 0–5)

## 2023-12-22 LAB — TOXICOLOGY SCREEN, URINE
AMPHETAMINE SCREEN URINE: NEGATIVE
BARBITURATE SCREEN URINE: NEGATIVE
BENZODIAZEPINE SCREEN, URINE: NEGATIVE
BUPRENORPHINE, URINE SCREEN: NEGATIVE
CANNABINOID SCREEN URINE: NEGATIVE
COCAINE(METAB.)SCREEN, URINE: NEGATIVE
FENTANYL SCREEN, URINE: POSITIVE — AB
METHADONE SCREEN, URINE: NEGATIVE
OPIATE SCREEN URINE: NEGATIVE
OXYCODONE SCREEN URINE: NEGATIVE

## 2023-12-22 LAB — SLIDE REVIEW

## 2023-12-22 LAB — LACTATE, VENOUS, WHOLE BLOOD
LACTATE BLOOD VENOUS: 4.9 mmol/L (ref 0.5–1.8)
LACTATE BLOOD VENOUS: 2.6 mmol/L — ABNORMAL HIGH (ref 0.5–1.8)

## 2023-12-22 LAB — COMPREHENSIVE METABOLIC PANEL
ALBUMIN: 4.8 g/dL (ref 3.4–5.0)
ALKALINE PHOSPHATASE: 65 U/L (ref 46–116)
ALT (SGPT): 18 U/L (ref 10–49)
ANION GAP: 18 mmol/L — ABNORMAL HIGH (ref 5–14)
BILIRUBIN TOTAL: 0.9 mg/dL (ref 0.3–1.2)
BLOOD UREA NITROGEN: <5 mg/dL — ABNORMAL LOW (ref 9–23)
CALCIUM: 10.7 mg/dL — ABNORMAL HIGH (ref 8.7–10.4)
CHLORIDE: 102 mmol/L (ref 98–107)
CO2: 22 mmol/L (ref 20.0–31.0)
CREATININE: 1.05 mg/dL — ABNORMAL HIGH (ref 0.55–1.02)
EGFR CKD-EPI (2021) FEMALE: 62 mL/min/1.73m2 (ref >=60)
GLUCOSE RANDOM: 200 mg/dL — ABNORMAL HIGH (ref 70–179)
PROTEIN TOTAL: 8.7 g/dL — ABNORMAL HIGH (ref 5.7–8.2)
SODIUM: 142 mmol/L (ref 135–145)

## 2023-12-22 LAB — CBC W/ AUTO DIFF
BASOPHILS ABSOLUTE COUNT: 0.1 10*9/L (ref 0.0–0.1)
BASOPHILS RELATIVE PERCENT: 0.4 %
EOSINOPHILS ABSOLUTE COUNT: 0.2 10*9/L (ref 0.0–0.5)
EOSINOPHILS RELATIVE PERCENT: 0.8 %
HEMATOCRIT: 47.9 % — ABNORMAL HIGH (ref 34.0–44.0)
HEMOGLOBIN: 15.9 g/dL — ABNORMAL HIGH (ref 11.3–14.9)
LYMPHOCYTES ABSOLUTE COUNT: 3.1 10*9/L (ref 1.1–3.6)
LYMPHOCYTES RELATIVE PERCENT: 15.1 %
MEAN CORPUSCULAR HEMOGLOBIN CONC: 33.3 g/dL (ref 32.0–36.0)
MEAN CORPUSCULAR HEMOGLOBIN: 29 pg (ref 25.9–32.4)
MEAN CORPUSCULAR VOLUME: 87 fL (ref 77.6–95.7)
MONOCYTES ABSOLUTE COUNT: 0.9 10*9/L — ABNORMAL HIGH (ref 0.3–0.8)
MONOCYTES RELATIVE PERCENT: 4.4 %
NEUTROPHILS ABSOLUTE COUNT: 16.1 10*9/L — ABNORMAL HIGH (ref 1.8–7.8)
NEUTROPHILS RELATIVE PERCENT: 79.3 %
PLATELET COUNT: >268 10*9/L (ref 150–450)
RED BLOOD CELL COUNT: 5.51 10*12/L — ABNORMAL HIGH (ref 3.95–5.13)
RED CELL DISTRIBUTION WIDTH: 14.8 % (ref 12.2–15.2)
WBC ADJUSTED: 20.3 10*9/L — ABNORMAL HIGH (ref 3.6–11.2)

## 2023-12-22 LAB — BLOOD GAS, VENOUS
BASE EXCESS VENOUS: -2.3 — ABNORMAL LOW (ref -2.0–2.0)
HCO3 VENOUS: 21 mmol/L — ABNORMAL LOW (ref 22–27)
O2 SATURATION VENOUS: 76.9 % (ref 40.0–85.0)
PCO2 VENOUS: 31 mmHg — ABNORMAL LOW (ref 40–60)
PH VENOUS: 7.45 — ABNORMAL HIGH (ref 7.32–7.43)
PO2 VENOUS: 38 mmHg (ref 30–55)

## 2023-12-22 LAB — HIGH SENSITIVITY TROPONIN I - SINGLE
HIGH SENSITIVITY TROPONIN I: <3 ng/L (ref <=34)
HIGH SENSITIVITY TROPONIN I: <3 ng/L (ref <=34)

## 2023-12-22 LAB — LIPASE: LIPASE: 39 U/L (ref 12–53)

## 2023-12-22 LAB — POTASSIUM
POTASSIUM: 3.1 mmol/L — ABNORMAL LOW (ref 3.4–4.8)
POTASSIUM: 3.4 mmol/L (ref 3.4–4.8)

## 2023-12-22 LAB — MAGNESIUM: MAGNESIUM: 2.4 mg/dL (ref 1.6–2.6)

## 2023-12-22 LAB — BETA HYDROXYBUTYRATE: BETA-HYDROXYBUTYRATE: 0.95 mmol/L — ABNORMAL HIGH (ref 0.02–0.27)

## 2023-12-22 MED ADMIN — prochlorperazine (COMPAZINE) injection 10 mg: 10 mg | INTRAVENOUS | @ 15:00:00 | Stop: 2023-12-22

## 2023-12-22 MED ADMIN — ROCuronium (ZEMURON) injection: INTRAVENOUS | Stop: 2023-12-22

## 2023-12-22 MED ADMIN — iohexol (OMNIPAQUE) 350 mg iodine/mL solution 100 mL: 100 mL | INTRAVENOUS | @ 18:00:00 | Stop: 2023-12-22

## 2023-12-22 MED ADMIN — famotidine (PF) (PEPCID) injection 40 mg: 40 mg | INTRAVENOUS | @ 15:00:00 | Stop: 2023-12-22

## 2023-12-22 MED ADMIN — fentaNYL (PF) (SUBLIMAZE) injection: INTRAVENOUS | Stop: 2023-12-22

## 2023-12-22 MED ADMIN — phenylephrine (NEO-SYNEPHRINE) injection: INTRAVENOUS | Stop: 2023-12-22

## 2023-12-22 MED ADMIN — ketamine (KETALAR) injection: INTRAVENOUS | Stop: 2023-12-22

## 2023-12-22 MED ADMIN — electrolyte-A (PLASMA-LYT A) infusion: INTRAVENOUS | Stop: 2023-12-22

## 2023-12-22 MED ADMIN — midazolam (VERSED) injection: INTRAVENOUS | Stop: 2023-12-22

## 2023-12-22 MED ADMIN — succinylcholine chloride (ANECTINE) injection: INTRAVENOUS | Stop: 2023-12-22

## 2023-12-22 MED ADMIN — metoclopramide (REGLAN) injection 10 mg: 10 mg | INTRAVENOUS | @ 18:00:00 | Stop: 2023-12-22

## 2023-12-22 MED ADMIN — lidocaine (PF) (XYLOCAINE-MPF) 20 mg/mL (2 %) injection: INTRAVENOUS | Stop: 2023-12-22

## 2023-12-22 MED ADMIN — diphenhydrAMINE (BENADRYL) injection 25 mg: 25 mg | INTRAVENOUS | @ 15:00:00 | Stop: 2023-12-22

## 2023-12-22 MED ADMIN — lactated Ringers infusion: INTRAVENOUS | Stop: 2023-12-22

## 2023-12-22 MED ADMIN — lactated ringers bolus 1,000 mL: 1000 mL | INTRAVENOUS | @ 14:00:00 | Stop: 2023-12-22

## 2023-12-22 MED ADMIN — diphenhydrAMINE (BENADRYL) injection 25 mg: 25 mg | INTRAVENOUS | @ 18:00:00 | Stop: 2023-12-22

## 2023-12-22 MED ADMIN — dexAMETHasone (DECADRON) 4 mg/mL injection: INTRAVENOUS | Stop: 2023-12-22

## 2023-12-22 MED ADMIN — Propofol (DIPRIVAN) injection: INTRAVENOUS | Stop: 2023-12-22

## 2023-12-22 MED ADMIN — ertapenem (INVANZ) injection: INTRAVENOUS | Stop: 2023-12-22

## 2023-12-22 NOTE — Unmapped (Signed)
 Brief Operative Note  (CSN: [id number])      Date of Surgery: 12/22/2023    Pre-op Diagnosis: SBO (small bowel obstruction)    (CMS-HCC) [K56.609]    Post-op Diagnosis: SBO (small bowel obstruction)    (CMS-HCC) [K56.609]    Procedure(s):  DIAGNOSTIC AND OR OPERATIVE LAPAROSCOPY: 49320 (CPT??)    LAPAROSCOPY, SURGICAL, ENTEROLYSIS (FREEING OF INTESTINAL ADHESION) (SEPARATE PROCEDURE): 44180 (CPT??)    ENTEROTOMY, SM INTESTINE, OTHER THAN DUODENUM; FOR EXPL, BX, OR FOREIGN BODY REMOVAL: 55979 (CPT??)    EXPLORATORY LAPAROTOMY, EXPLORATORY CELIOTOMY WITH OR WITHOUT BIOPSY(S): 49000 (CPT??)    Note: Revisions to procedures should be made in chart - see Procedures activity.    Performing Service: Trauma  Surgeons and Role:     * Vicci Janas CROME., MD - Primary     * Dell Shirk, MD - Resident - Assisting    Assistant: None    Findings: adhesive band from the uterus was causing transition point to small bowel; two areas of pinpoint perforations noted in the jejunum requiring repair related to the patients disease (non-iatrogenic)    Anesthesia: General    Estimated Blood Loss: 50 mL    Complications: None    Specimens: None collected    Implants: * No implants in log *      Shirk Dell, MD   Date: 12/22/2023  Time: 11:08 PM    I was present for the entirety of the procedure. Tredarius Cobern L Marquice Uddin II

## 2023-12-22 NOTE — Unmapped (Signed)
 Patient BIB [facility], pt screaming out in pain, and throwing up into trash can. Patient said this pain began last night and has progressively gotten worse.

## 2023-12-22 NOTE — Unmapped (Signed)
 BIB [facility] c/o abdominal pain since last night. Endorsing N/V. Recevied 50mcg Fentanyl  IM and 4mg  Zofran  ODT. Hx of DM.

## 2023-12-22 NOTE — Unmapped (Signed)
 General Surgery History & Physical  Note    Requesting Attending Physician:  Tinnie Verla Dasie Quinn, MD  Service Requesting Consult:  [facility])    Assessment:  Tracy Robles is a 58 y.o. female with history of Psoriasis, Eczema, DM, HTN, obesity who presented to the ED with acute onset epigastric pain and emesis.     Labs remarkable for leukocytosis to 20.3 with lactate 4.9. Repeat lactate 2.6. Patient tachycardic, BP stable. Exam remarkable for healed vertical incision extending from pubis to umbilicus. Significant tenderness to palpation over the epigastric and left upper quadrant. Non-tender over the right upper quadrant, bilateral lower quadrants. No guarding, rebound, or rigidity. CTA abdomen demonstrating small bowel obstruction with transition point in the right lower quadrant.     PLAN:   Small bowel obstruction  - Admit to general surgery  - Posted and consented for class B diagnostic laparoscopy, possible ex-lap, possible small bowel resection, possible ostomy  - NPO  - NG tube to low intermittent suction  - Lovenox  DVT Ppx scheduled to start 9/10 AM  - SSI    If you have any questions, concerns or changes in the patient's clinical status, please feel free to contact [facility] consult pager [phone].    History of Present Illness:   Chief Complaint:  abdominal pain    Tracy Robles is a 58 y.o. female with PMHx as below who is seen in consultation for abdominal pain at the request of Lauren Verla Dasie Quinn, MD on the [facility]) service.     Patient reports acute onset of epigastric abdominal pain at 1900 yesterday with associated emesis x6. She reports emesis turned brown and like stool upon arriving to the emergency department. Denies bloody emesis. Last bowel movement yesterday with no melena or hematochezia. Reports no episodes like this in the past. Does have history of ectopic pregnancy requiring surgery. No family history of colon cancer, Cologuard negative 04/17/23, never had colonoscopy. Denies recent sick contacts. Denies fever.       Past Medical History:   DM  HTN  Eczema  Psoriasis  Obesity    Past Surgical History:  Ectopic pregancy    Medications:  Mounjaro   Jardiance   Spironolactone   Secukinumab      Allergies:  Lisinopril  Latex  Penicillin    Family History:  No family history of colon cancer, coagulopathies     Social History:   Occasional tobacco use (2 times per year)  One cup of liquor on weekends  Denies recreational drug use    Review of Systems  10 systems were reviewed and are negative except as noted specifically in the HPI.    Objective  Vitals:   Temp:  [36.7 ??C (98.1 ??F)] 36.7 ??C (98.1 ??F)  Pulse:  [115-120] 115  Resp:  [29] 29  BP: (155)/(120) 155/120  MAP (mmHg):  [130] 130  SpO2:  [97 %-100 %] 100 %    Intake/Output last 3 shifts:  No intake/output data recorded.    Physical Exam:   Constitutional: NAD  Eyes: PERRL, EOM intact, no scleral icterus or conjunctival erythema  Ears, nose, mouth and throat: Moist mucus membranes, no discharge  Neck: Supple, trachea midline, no gross masses  Respiratory: No increased WOB, Symmetrical chest rise, no audible wheeze or stridor  Cardiovascular: Extremities are warm and well perfused, no active bleeding  Gastrointestinal: Abdomen is non-distended. Significantly tender epigastric and left upper quadrant. Non-tender RUQ, BLLQ. No guarding, rebound, or rigidity. Healed incision extending between  umbilicus and pubic symphysis.   Musculoskeletal: No gross limitations in ROM  Skin: No rashes, lesions, or wounds  Neurologic: Alert. No gross focal neurological deficits  Psychiatric: Appropriate mood and affect    Most Recent Labs:  Lab Results   Component Value Date    WBC 20.3 (H) 12/22/2023    HGB 15.9 (H) 12/22/2023    HCT 47.9 (H) 12/22/2023    PLT >268 12/22/2023       Lab Results   Component Value Date    NA 142 12/22/2023    K 3.1 (L) 12/22/2023    CL 102 12/22/2023    CO2 22.0 12/22/2023    BUN <5 (L) 12/22/2023    CREATININE 1.05 (H) 12/22/2023    CALCIUM 10.7 (H) 12/22/2023    MG 2.4 12/22/2023       Imaging:  CTA Chest/Abd (Aortic Dissection)  Result Date: 12/22/2023  EXAM: CTA Chest, Abdomen, Pelvis for Aortic Dissection DATE: 12/22/2023 2:05 PM ACCESSION: [id number] UN DICTATED: 12/22/2023 2:11 PM INTERPRETATION LOCATION: MAIN CAMPUS CLINICAL INDICATION: 58 years old Female with abd pain, epigastric, LA  COMPARISON: None TECHNIQUE: A helical CT of the chest was obtained without IV contrast from the thoracic inlet through the hemidiaphragms. Images were reconstructed in the axial plane. Next, a spiral CTA  of the chest, abdomen and pelvis was obtained with IV contrast from the thoracic inlet through the aortic bifurcation. Images were reconstructed in the axial plane.  Multiplanar reformatted and MIP images are provided for further evaluation of the aorta. FINDINGS: AORTA: Normal caliber aorta. No thoracic aortic intramural hematoma.  No aortic dissection. CHEST: Normal heart size.  No pericardial effusion. No mediastinal lymphadenopathy. Patulous thoracic esophagus. Clear central airways. No consolidation.  No pleural effusion. ABDOMEN and PELVIS: HEPATOBILIARY: No focal hepatic lesions. Gallbladder XXXXXX No biliary dilatation.  SPLEEN: Unremarkable. PANCREAS: Unremarkable. ADRENALS: Unremarkable. KIDNEYS/URETERS: Unremarkable. BLADDER: Unremarkable. PELVIC/REPRODUCTIVE ORGANS: Unremarkable. GI TRACT: Multiple dilated loops of small bowel, with gradual transition point in the right pelvis (10:62). No evidence of bowel inflammation. Normal appendix. PERITONEUM/RETROPERITONEUM AND MESENTERY: No free air or fluid. LYMPH NODES: No enlarged lymph nodes. BONES: Grade 1 anterolisthesis of L4 on L5. SOFT TISSUES: Unremarkable.     No aortic dissection. Small bowel obstruction with somewhat gradual transition in the right pelvis. ++++++++++++++++++++ The findings of this study were discussed via telephone with DR. JOHN KIM by Dr. Fonda Door on 12/22/2023 2:23 PM. -----------------------------------------------     XR Chest Portable  Result Date: 12/22/2023  EXAM: XR CHEST PORTABLE ACCESSION: [id number] UN REPORT DATE: 12/22/2023 11:45 AM CLINICAL INDICATION: SHORTNESS OF BREATH  TECHNIQUE: Single View AP Chest Radiograph. COMPARISON: None FINDINGS: Lungs are low in volume with bibasilar atelectasis. Left costophrenic sulcus is blunted. No pneumothorax. Normal heart size and mediastinal contours. 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     No acute abnormalities.     ECG 12 Lead  Result Date: 12/22/2023  SINUS TACHYCARDIA POSSIBLE LEFT ATRIAL ENLARGEMENT NONSPECIFIC ST AND T WAVE ABNORMALITY ABNORMAL ECG NO PREVIOUS ECGS AVAILABLE

## 2023-12-22 NOTE — Unmapped (Signed)
 Emergency Department Provider Note        ED Clinical Impression     Final diagnoses:   SBO (small bowel obstruction)    (CMS-HCC) (Primary)       ED Assessment/Plan     58yo female w/ h/o DM, HTN here with 10/10 sharp epigastric pain that sometimes radiates to the back that started suddenly last evening. She has had several episodes of non-bloody vomit. No changes in bowel function. Takes meloxicam  daily for shoulder pain but no other NSAID use. Has 1 drink per week. No history of abdominal surgery. No chest pain. Tachycardic and tachypneic. Lactate 4.9. WBC 20.3    Differential includes gastritis, ulcer/perforation, pancreatitis, biliary pathology, aortic dissection. Plan for CT to further evaluate for acute abdominal pathology. CT shows SBO with gradual transition point in RLQ. NGT placed. Discussed with general surgery who will take the patient to the OR.         History     Chief Complaint   Patient presents with    Abdominal Pain       Abdominal Pain  Associated symptoms: nausea and vomiting    Associated symptoms: no chest pain, no chills, no diarrhea, no dysuria, no fever, no hematuria and no shortness of breath        Patient is a 58yo w/ a h/o DM, HTN here with severe abdominal pain that started suddenly last night. She describes it as sharp pain that is 10/10 primarily in the epigastric region and occasionally radiates to the back. When the pain started it was severe but has since gotten worse. She has had non-bloody vomiting. Last stool was yesterday and was normal. She has not had abdominal pain before. She had chills here in the ED but has not felt febrile. She takes 15mg  meloxicam  daily for shoulder pain but denies other NSAID use. Has 1 drink per week. Denies illicit drug use and said she used to smoke occasionally.       Past Medical History[1]    Past Surgical History[2]    Family History[3]    Social History[4]    Review of Systems   Constitutional:  Negative for chills and fever.   Respiratory: Negative for shortness of breath.    Cardiovascular:  Negative for chest pain.   Gastrointestinal:  Positive for abdominal pain, nausea and vomiting. Negative for diarrhea.   Genitourinary:  Negative for dysuria, hematuria and urgency.   Neurological:  Negative for numbness and headaches.       Physical Exam     BP 107/68  - Pulse 132  - Temp 36.9 ??C (98.4 ??F) (Oral)  - Resp 16  - SpO2 98%     Physical Exam  Constitutional:       General: She is in acute distress.      Appearance: She is obese.   HENT:      Head: Normocephalic and atraumatic.   Eyes:      Pupils: Pupils are equal, round, and reactive to light.   Cardiovascular:      Rate and Rhythm: Regular rhythm. Tachycardia present.   Pulmonary:      Breath sounds: Normal breath sounds.   Abdominal:      General: Abdomen is flat.      Palpations: Abdomen is soft.      Tenderness: There is generalized abdominal tenderness and tenderness in the epigastric area. There is no right CVA tenderness, left CVA tenderness, guarding or rebound.            [  1]   Past Medical History:  Diagnosis Date    Allergic Don't know    Penicillin    Arthritis 2011    Lower back    BMI 60.0-69.9, adult (CMS-HCC) 05/29/2021    Diabetes mellitus    (CMS-HCC)     Eczema     High blood pressure     Psoriasis    [2]   Past Surgical History:  Procedure Laterality Date    PR ENTEROTOMY,NON-DUOD,EXPLOR/BX/FB REMOVAL N/A 12/22/2023    Procedure: ENTEROTOMY, SM INTESTINE, OTHER THAN DUODENUM; FOR EXPL, BX, OR FOREIGN BODY REMOVAL;  Surgeon: Vicci Janas CROME., MD;  Location: OR [facility];  Service: Trauma    PR EXPLORATORY OF ABDOMEN N/A 12/22/2023    Procedure: EXPLORATORY LAPAROTOMY, EXPLORATORY CELIOTOMY WITH OR WITHOUT BIOPSY(S);  Surgeon: Vicci Janas CROME., MD;  Location: OR [facility];  Service: Trauma    PR LAP, SURG ENTEROLYSIS N/A 12/22/2023    Procedure: LAPAROSCOPY, SURGICAL, ENTEROLYSIS (FREEING OF INTESTINAL ADHESION) (SEPARATE PROCEDURE);  Surgeon: Vicci Janas CROME., MD;  Location: OR [facility]; Service: Trauma    PR LAP,DIAGNOSTIC ABDOMEN N/A 12/22/2023    Procedure: DIAGNOSTIC AND OR OPERATIVE LAPAROSCOPY;  Surgeon: Vicci Janas CROME., MD;  Location: OR [facility];  Service: Trauma   [3]   Family History  Problem Relation Age of Onset    Diabetes Father     Hypertension Father     Kidney disease Father     Clotting disorder Father         Blood clot    Asthma Father     Diabetes Sister     Miscarriages / Stillbirths Sister     Hypertension Mother     Arthritis Mother     Breast cancer Maternal Aunt     Stroke Maternal Grandmother     Miscarriages / Stillbirths Maternal Grandmother     Melanoma Neg Hx     Basal cell carcinoma Neg Hx     Squamous cell carcinoma Neg Hx     Ovarian cancer Neg Hx    [4]   Social History  Socioeconomic History    Marital status: Widowed     Spouse name: None    Number of children: None    Years of education: None    Highest education level: None   Tobacco Use    Smoking status: Never     Passive exposure: Current    Smokeless tobacco: Never   Vaping Use    Vaping status: Never Used   Substance and Sexual Activity    Alcohol use: Yes     Alcohol/week: 1.0 standard drink of alcohol     Comment: Only on Saturday    Drug use: Never    Sexual activity: Not Currently     Partners: Male     Birth control/protection: None   Other Topics Concern    Do you use sunscreen? No    Tanning bed use? No    Are you easily burned? No    Excessive sun exposure? No    Blistering sunburns? No     Social Drivers of Psychologist, prison and probation services Strain: Low Risk  (04/04/2023)    Overall Financial Resource Strain (CARDIA)     Difficulty of Paying Living Expenses: Not very hard   Food Insecurity: No Food Insecurity (04/04/2023)    Hunger Vital Sign     Worried About Running Out of Food in the Last Year: Never true     Ran Out of  Food in the Last Year: Never true   Transportation Needs: No Transportation Needs (04/04/2023)    PRAPARE - Therapist, art (Medical): No     Lack of Transportation (Non-Medical): No   Housing: Low Risk  (04/04/2023)    Housing     Within the past 12 months, have you ever stayed: outside, in a car, in a tent, in an overnight shelter, or temporarily in someone else's home (i.e. couch-surfing)?: No     Are you worried about losing your housing?: No        Luke Rush, MD  12/23/23 1444

## 2023-12-23 LAB — CBC W/ AUTO DIFF
BASOPHILS ABSOLUTE COUNT: 0 10*9/L (ref 0.0–0.1)
BASOPHILS RELATIVE PERCENT: 0.1 %
EOSINOPHILS ABSOLUTE COUNT: 0 10*9/L (ref 0.0–0.5)
EOSINOPHILS RELATIVE PERCENT: 0 %
HEMATOCRIT: 39.3 % (ref 34.0–44.0)
HEMOGLOBIN: 13.2 g/dL (ref 11.3–14.9)
LYMPHOCYTES ABSOLUTE COUNT: 0.9 10*9/L — ABNORMAL LOW (ref 1.1–3.6)
LYMPHOCYTES RELATIVE PERCENT: 11.1 %
MEAN CORPUSCULAR HEMOGLOBIN CONC: 33.7 g/dL (ref 32.0–36.0)
MEAN CORPUSCULAR HEMOGLOBIN: 29.4 pg (ref 25.9–32.4)
MEAN CORPUSCULAR VOLUME: 87.2 fL (ref 77.6–95.7)
MEAN PLATELET VOLUME: 9.2 fL (ref 6.8–10.7)
MONOCYTES ABSOLUTE COUNT: 0.8 10*9/L (ref 0.3–0.8)
MONOCYTES RELATIVE PERCENT: 9.1 %
NEUTROPHILS ABSOLUTE COUNT: 6.6 10*9/L (ref 1.8–7.8)
NEUTROPHILS RELATIVE PERCENT: 79.7 %
PLATELET COUNT: 220 10*9/L (ref 150–450)
RED BLOOD CELL COUNT: 4.51 10*12/L (ref 3.95–5.13)
RED CELL DISTRIBUTION WIDTH: 15 % (ref 12.2–15.2)
WBC ADJUSTED: 8.3 10*9/L (ref 3.6–11.2)

## 2023-12-23 LAB — BASIC METABOLIC PANEL
ANION GAP: 14 mmol/L (ref 5–14)
BLOOD UREA NITROGEN: 17 mg/dL (ref 9–23)
BUN / CREAT RATIO: 13
CALCIUM: 8.9 mg/dL (ref 8.7–10.4)
CHLORIDE: 104 mmol/L (ref 98–107)
CO2: 24 mmol/L (ref 20.0–31.0)
CREATININE: 1.3 mg/dL — ABNORMAL HIGH (ref 0.55–1.02)
EGFR CKD-EPI (2021) FEMALE: 48 mL/min/1.73m2 — ABNORMAL LOW (ref >=60)
GLUCOSE RANDOM: 152 mg/dL — ABNORMAL HIGH (ref 70–99)
POTASSIUM: 4.5 mmol/L (ref 3.4–4.8)
SODIUM: 142 mmol/L (ref 135–145)

## 2023-12-23 LAB — MAGNESIUM: MAGNESIUM: 1.8 mg/dL (ref 1.6–2.6)

## 2023-12-23 LAB — PHOSPHORUS: PHOSPHORUS: 5.5 mg/dL — ABNORMAL HIGH (ref 2.4–5.1)

## 2023-12-23 MED ADMIN — acetaminophen (OFIRMEV) 10 mg/mL injection 1,000 mg: 1000 mg | INTRAVENOUS | @ 15:00:00 | Stop: 2023-12-23

## 2023-12-23 MED ADMIN — insulin lispro (HumaLOG) injection CORRECTIONAL 0-20 Units: 0-20 [IU] | SUBCUTANEOUS | @ 04:00:00

## 2023-12-23 MED ADMIN — ketorolac (TORADOL) injection: INTRAVENOUS | @ 03:00:00 | Stop: 2023-12-22

## 2023-12-23 MED ADMIN — lactated Ringers infusion: INTRAVENOUS | @ 01:00:00 | Stop: 2023-12-22

## 2023-12-23 MED ADMIN — HYDROmorphone (PF) (DILAUDID) injection: INTRAVENOUS | @ 02:00:00 | Stop: 2023-12-22

## 2023-12-23 MED ADMIN — methocarbamol (ROBAXIN) 1,000 mg in sodium chloride (NS) 0.9 % 50 mL IVPB: 1000 mg | INTRAVENOUS | @ 12:00:00 | Stop: 2023-12-25

## 2023-12-23 MED ADMIN — methocarbamol (ROBAXIN) 1,000 mg in sodium chloride (NS) 0.9 % 50 mL IVPB: 1000 mg | INTRAVENOUS | @ 05:00:00 | Stop: 2023-12-25

## 2023-12-23 MED ADMIN — phenylephrine (NEO-SYNEPHRINE) injection: INTRAVENOUS | Stop: 2023-12-22

## 2023-12-23 MED ADMIN — morphine 4 mg/mL injection 4 mg: 4 mg | INTRAVENOUS | @ 08:00:00 | Stop: 2024-01-05

## 2023-12-23 MED ADMIN — ketamine (KETALAR) injection: INTRAVENOUS | @ 01:00:00 | Stop: 2023-12-22

## 2023-12-23 MED ADMIN — ROCuronium (ZEMURON) injection: INTRAVENOUS | @ 02:00:00 | Stop: 2023-12-22

## 2023-12-23 MED ADMIN — ondansetron (ZOFRAN) injection: INTRAVENOUS | @ 03:00:00 | Stop: 2023-12-22

## 2023-12-23 MED ADMIN — labetalol (NORMODYNE) injection: 5 mg | INTRAVENOUS | @ 04:00:00 | Stop: 2023-12-23

## 2023-12-23 MED ADMIN — insulin lispro (HumaLOG) injection CORRECTIONAL 0-20 Units: 0-20 [IU] | SUBCUTANEOUS | @ 08:00:00

## 2023-12-23 MED ADMIN — acetaminophen (OFIRMEV) 10 mg/mL injection 1,000 mg: 1000 mg | INTRAVENOUS | @ 08:00:00 | Stop: 2023-12-23

## 2023-12-23 MED ADMIN — sugammadex (BRIDION) injection: INTRAVENOUS | @ 03:00:00 | Stop: 2023-12-22

## 2023-12-23 MED ADMIN — fentaNYL (PF) (SUBLIMAZE) injection 25 mcg: 25 ug | INTRAVENOUS | @ 04:00:00 | Stop: 2023-12-23

## 2023-12-23 MED ADMIN — Propofol (DIPRIVAN) injection: INTRAVENOUS | @ 01:00:00 | Stop: 2023-12-22

## 2023-12-23 MED ADMIN — lactated ringers bolus 1,000 mL: 1000 mL | INTRAVENOUS | @ 13:00:00 | Stop: 2023-12-23

## 2023-12-23 MED ADMIN — phenylephrine (NEO-SYNEPHRINE) injection: INTRAVENOUS | @ 02:00:00 | Stop: 2023-12-22

## 2023-12-23 MED ADMIN — ROCuronium (ZEMURON) injection: INTRAVENOUS | Stop: 2023-12-22

## 2023-12-23 MED ADMIN — enoxaparin (LOVENOX) syringe 40 mg: 40 mg | SUBCUTANEOUS | @ 12:00:00 | Stop: 2023-12-23

## 2023-12-23 MED ADMIN — haloperidol LACTATE (HALDOL) injection: INTRAVENOUS | Stop: 2023-12-22

## 2023-12-23 MED ADMIN — phenylephrine (NEO-SYNEPHRINE) injection: INTRAVENOUS | @ 03:00:00 | Stop: 2023-12-22

## 2023-12-23 MED ADMIN — Propofol (DIPRIVAN) injection: INTRAVENOUS | Stop: 2023-12-22

## 2023-12-23 MED ADMIN — acetaminophen (OFIRMEV) 10 mg/mL injection: INTRAVENOUS | Stop: 2023-12-22

## 2023-12-23 MED ADMIN — pantoprazole (Protonix) injection 40 mg: 40 mg | INTRAVENOUS | @ 12:00:00

## 2023-12-23 MED ADMIN — lactated Ringers infusion: 125 mL/h | INTRAVENOUS | @ 12:00:00

## 2023-12-23 MED ADMIN — gabapentin (NEURONTIN) oral solution: 300 mg | ORAL | @ 15:00:00

## 2023-12-23 MED ADMIN — metroNIDAZOLE (FLAGYL) IVPB 500 mg: 500 mg | INTRAVENOUS | @ 17:00:00 | Stop: 2023-12-27

## 2023-12-23 MED ADMIN — esmolol (BREVIBLOC) injection: INTRAVENOUS | @ 01:00:00 | Stop: 2023-12-22

## 2023-12-23 MED ADMIN — sodium chloride irrigation (NS) 0.9 % irrigation solution: Stop: 2023-12-22

## 2023-12-23 MED ADMIN — HYDROmorphone (DILAUDID) injection Syrg 0.5 mg: .5 mg | INTRAVENOUS | @ 15:00:00 | Stop: 2024-01-05

## 2023-12-23 MED ADMIN — enoxaparin (LOVENOX) syringe 40 mg: 40 mg | SUBCUTANEOUS | @ 23:00:00

## 2023-12-23 MED ADMIN — lactated Ringers infusion: 125 mL/h | INTRAVENOUS | @ 04:00:00

## 2023-12-23 MED ADMIN — ROCuronium (ZEMURON) injection: INTRAVENOUS | @ 01:00:00 | Stop: 2023-12-22

## 2023-12-23 NOTE — Unmapped (Signed)
 Operative Note  (CSN: [id number])    Date of Surgery: 12/22/2023    Pre-op Diagnosis: SBO (small bowel obstruction)    (CMS-HCC) [K56.609]    Post-op Diagnosis: SBO (small bowel obstruction)    (CMS-HCC) [K56.609]    Procedure(s):   1 - Diagnostic Laparoscopy  2 - Laparoscopic lysis of adhesions  3 - Repair of small bowel perforations x 2  4 - conversion to open laparotomy    Note: Revisions to procedures should be made in chart - see Procedures tab.    Performing Service: Trauma  Surgeons and Role:     * Vicci Janas CROME., MD - Primary     * Qadir, Imazul, MD - Resident - Assisting    Anesthesia: General    Estimated Blood Loss: 50 mL    Complications: None    Specimens: None collected    Implants: * No implants in log *    Indications: Tracy Robles is a 58 y.o. female with history of Psoriasis, Eczema, DM, HTN, obesity who presented to the ED with acute onset epigastric pain and emesis.      Labs remarkable for leukocytosis to 20.3 with lactate 4.9. Repeat lactate 2.6. Patient tachycardic, BP stable. Exam remarkable for healed vertical incision extending from pubis to umbilicus. Significant tenderness to palpation over the epigastric and left upper quadrant. Non-tender over the right upper quadrant, bilateral lower quadrants. No guarding, rebound, or rigidity. CTA abdomen demonstrating small bowel obstruction with transition point in the right lower quadrant.     Given abdominal tenderness and laboratory values, an urgent surgical intervention was recommended to the patient. Risks and benefits of the operation were discussed. All questions and concerns addressed.     Operative Findings: omentum adhered to the anterior abdominal wall, likely from prior operation; adhesive band from the uterus adhered to small bowel causing small bowel obstruction with resultant upstream dilation of bowel; two small bowel perforations noted in the jejunum requiring repair; no bowel resection needed    Procedure Description: The patient was brought into the operating room. The initial time out was performed to confirm the patient, procedure, and preoperative antibiotics. The abdomen was then prepped and draped in the usual fashion. General endotracheal anesthesia was administered. A second time out was performed.    We achieved initial abdominal insufflation to 15 mmHg via Veress needle in the left upper quadrant at Palmer's point. We then entered the abdomen above the umbilicus by making a vertically oriented incision at the midline. The skin, subcutaneous tissue, and fascia was sharply divided and entry into the abdomen was achieved with a 5 mm port. The laparoscope was introduced into the abdomen and immediately dense adhesions involving the omentum and anterior abdominal wall were encountered. Of note, no bowel was noted to be wrapped in these adhesions.     The Veress needle was removed after ensuring that no iatrogenic injuries had occurred in the abdomen and this was replaced with a second 5mm port. An additional two 5mm ports were placed in the left lower quadrant of the abdomen and in the suprapubic region to facilitate laparoscopic lysis of adhesions.    For the next hour or so, dense omental adhesions were taken down from the anterior abdominal wall in the region of the patient's prior abdominal operation using a combination of laparoscopic graspers and the laparoscopic ligasure device. Again, no small or large bowel was noted to be involved in these adhesions.     After taking down all  adhesions, we moved to evaluate the entirety of the small bowel starting at the ileocecal valve. Appendix was examined and appeared completely normal. As we ran the bowel more proximally, the ileum was noted to be adhered down in the pelvis. Further examination revealed a single adhesive band arising from the uterus that was causing an obvious transition point at the level of the ileum. This band was sharply divided, leading to an immediate release of small bowel. At this time, the entirety of the bowel appeared viable and not threatened.     As we proceeded to run the bowel more proximally, a small bowel perforation was encountered at the level of the jejunum with small amounts of succus noted to be leaking out. No adhesions were near this area of perforation and no evidence of iatrogenic trauma to the bowel. At this point, we made the decision to convert to open to properly repair the small bowel perforation.     Incorporating the supra umbilical laparoscopic incision site, a 10cm midline incision was made. Skin was divided sharply; subcutaneous tissue and fascia was divided with electrocautery. After entry into the abdomen, the Alexis wound protector was introduced. The area of concern at the small bowel was eviscerated out of the abdomen. The perforation was noted to be close to the mesentery and involved less than 15% of the small bowel circumference. We elected to repair this perforation primarily in two layers with 3-0 vicryl suture.     We continued to run the bowel more proximally and about 20cm away, a second small bowel perforation was noted with small amounts of succus noted to be leaking out. The perforation was again noted to be close to the mesentery and involved less than 15% of the small bowel circumference. We elected to repair this perforation primarily in two layers with 3-0 vicryl suture.     The remainder of the small bowel proximal to this point was free of any other perforations. The entirety of the small bowel overall looked completely viable and non ischemic.     The abdomen was copiously irrigated with Irrisept solution. Wound protector was removed. Midline fascia re approximated with 0-PDS suture x2. The subcutaneous tissue was irrigated and skin was approximated with skin staples. Provena wound vac was applied onto the incision with appropriate seal and suction.    All counts correct. No immediate post operative complications encountered. Patient was awakened from general anesthesia and extubated without issue. Patient will be transported to PACU and back to floor.     Hugo Fender, MD   Date: 12/23/2023  Time: 5:00 AM     I was present for the entirety of the procedure. Quinta Eimer L Aristeo Hankerson II

## 2023-12-23 NOTE — Unmapped (Addendum)
 PHYSICAL THERAPY  Discharge Note (12/23/23 0842)          Patient Name:  Tracy Robles       Medical Record Number: [id number]   Date of Birth: 09/28/1965  Sex: Female          Post-Discharge Physical Therapy Recommendations:  PT Post Acute Discharge Recommendations: Skilled PT services NOT indicated   Equipment Recommendation  PT DME Recommendations: Rolling walker          Treatment Diagnosis: Abnormalities of gait and mobility       ASSESSMENT  Problem List: Pain, Decreased range of motion      Assessment : Tracy Robles tolerated PT initial evaluation well without concerning clinical or hemodynamic response. She completed all mobility with standby assist, including hallway ambulation (distance she would need to navigate her home) with rolling walker. Patient also demonstrated adequate strength to navigate stairs at home. No further skilled PT needs indicated.      Today's Interventions: Balance activities, Endurance activities, Gait training, Patient/Family/Caregiver Education, Therapeutic activity, Therapeutic exercise     Personal Factors/Comorbidities Present: 1-2 factors   Examination of Body System: Musculoskeletal, Cardiovascular, Pulmonary, Neurological, Integumentary, Communication, Activity/participation  Clinical Presentation: Stable    Eval Complexity : Low Complexity     Activity Tolerance: Limited by pain       PLAN  Planned Frequency of Treatment: Plan of Care Initiated: 12/23/23  D/C Services Weekly Frequency: D/C Services     Goals:   Patient and Family Goals: To return home    Long Term Goal #1: Not applicable - patient with no further acute PT needs     Prognosis:  Excellent  Positive Indicators: Prior level of function, motivation  Barriers to Discharge: Obesity, Pain       SUBJECTIVE  Communication Preference: Verbal     Patient reports: patient agreeable to PT evaluation  Pain Comments: 8/10 abdominal incision    Prior Functional Status: Patient independent at baseline without use of device. She reports no activity limitations.  Living Situation  Living Environment: House  Lives With: Mother (Sister is nearby and can help as needed)  Home Living: One level home, Stairs to enter with rails  Rail placement (outside): Bilateral rails in reach  Number of Stairs to Enter (outside): 6  Caregiver Identified?: Yes  Caregiver Availability: Intermittent  Caregiver Ability: Limited lifting  Caregiver Identified?: Yes   Equipment available at home:  Forensic scientist chair)     Past Medical History[1]         Social History     Tobacco Use    Smoking status: Never     Passive exposure: Current    Smokeless tobacco: Never   Substance Use Topics    Alcohol use: Yes     Alcohol/week: 1.0 standard drink of alcohol     Comment: Only on Saturday       Past Surgical History[2]          Family History[3]     Allergies: Lisinopril, Latex, and Penicillins        OBJECTIVE  Precautions / Restrictions  Precautions: Other precautions, Falls precautions (abdominal)  Weight Bearing Status: Non-applicable  Required Braces or Orthoses: Non-applicable     Equipment / Environment: Vascular access (PIV, TLC, Port-a-cath, PICC), Wound Vac     Vitals/Orthostatics : VSS per Epic ; tachy throughout with HR in 120s - no report of palpitations, lightheadedness, dizziness, etc and RN aware.     Cognition: WFL  Orientation:  Oriented x4  Visual/Perception: Wears Glasses/Contacts all the time  Hearing: No deficit identified     Skin Inspection: Intact where visualized, Dressing C/D/I     Upper Extremities  UE ROM: Right WFL, Left WFL  UE Strength: Right WFL, Left WFL    Lower Extremities  LE ROM: Right WFL, Left WFL  LE Strength: Right WFL, Left WFL    Face, Cervical and Trunk ROM  Face ROM: WFL  Cervical ROM: WFL  Trunk ROM: Impaired  Trunk ROM comment : Limited by abdominal precautions     Coordination: WFL  Proprioception: WFL  Sensation: WFL  Posture: WFL    Static Sitting-Level of Assistance: Independent  Dynamic Sitting-Level of Assistance: Archivist Standing-Level of Assistance: Independent  Dynamic Standing - Level of Assistance: Supervision  Standing Balance comments: with RW      Bed Mobility: Rolling Right, Rolling Left, Supine to Sit, Sit to Supine  Rolling right assistance level: Modified independent, requires aide device or extra time  Rolling left assistance level: Modified independent, requires aide device or extra time  Supine to Sit assistance level: Modified independent, requires aide device or extra time  Sit to Supine assistance level: Modified independent, requires aide device or extra time  Bed Mobility comments: via log roll technique     Transfers: Sit to Stand  Sit to Stand assistance level: Standby assist, set-up cues, supervision of patient - no hands on  Transfer comments: with RW      Gait Level of Assistance: Standby assist, set-up cues, supervision of patient - no hands on  Gait Assistive Device: Rolling walker  Gait Distance Ambulated (ft): 100 ft  Skilled Treatment Performed: slightly decreased gait speed and step length ; increased forward flexion of trunk due to abdominal incision pain ; no gait pattern deviation, buckling, or loss of balance     Stairs: simulated with marching and squats - patient demonstrated adequate strength to navigate 6 steps with bilateral hand rails     Endurance: Good    Patient at end of session: All needs in reach, In bed, Lines intact, Notified Nurse, Staff present (RN at bedside)    Physical Therapy Session Duration  PT Individual [mins]: 10  PT Co-Treatment [mins]: 18 (OT Tracy Robles)  Reason for Co-treatment: Discharge pending    AM-PAC-6 click  Help currently need turning over In bed?: A Little - Minimal/Contact Guard Assist/Supervision  Help currently needed sitting down/standing up from chair with arms? : A Little - Minimal/Contact Guard Assist/Supervision  Help currently needed moving from supine to sitting on edge of bed?: A Little - Minimal/Contact Guard Assist/Supervision  Help currently needed moving to and from bed from wheelchair?: A Little - Minimal/Contact Guard Assist/Supervision  Help currently needed walking in a hospital room?: A Little - Minimal/Contact Guard Assist/Supervision  Help currently needed climbing 3-5 steps with railing?: A Little - Minimal/Contact Guard Assist/Supervision    Basic Mobility Score 6 click: 18    6 click Score (in points): % of Functional Impairment, Limitation, Restriction  6: 100% impaired, limited, restricted  7-8: At least 80%, but less than 100% impaired, limited restricted  9-13: At least 60%, but less than 80% impaired, limited restricted  14-19: At least 40%, but less than 60% impaired, limited restricted  20-22: At least 20%, but less than 40% impaired, limited restricted  23: At least 1%, but less than 20% impaired, limited restricted  24: 0% impaired, limited restricted    'AM-PAC' forms are  Copyright protected by The Trustees of [facility]         I attest that I have reviewed the above information.  Signed: Glennon Newcomer, PT  Filed 12/23/2023     As a part of my new employee training, I have completed the therapy assessment, intervention and documentation for the patient stated above under the direction and guidance of Augustin Pan, PT.         [1]   Past Medical History:  Diagnosis Date    Allergic Don't know    Penicillin    Arthritis 2011    Lower back    BMI 60.0-69.9, adult (CMS-HCC) 05/29/2021    Diabetes mellitus    (CMS-HCC)     Eczema     High blood pressure     Psoriasis    [2] No past surgical history on file.  [3]   Family History  Problem Relation Age of Onset    Diabetes Father     Hypertension Father     Kidney disease Father     Clotting disorder Father         Blood clot    Asthma Father     Diabetes Sister     Miscarriages / Stillbirths Sister     Hypertension Mother     Arthritis Mother     Breast cancer Maternal Aunt     Stroke Maternal Grandmother     Miscarriages / Stillbirths Maternal Grandmother     Melanoma Neg Hx     Basal cell carcinoma Neg Hx     Squamous cell carcinoma Neg Hx     Ovarian cancer Neg Hx

## 2023-12-23 NOTE — Unmapped (Signed)
 [place] TRAUMA, ACUTE CARE, and GENERAL SURGERY   - Surgery Daily Progress Note -  12/23/2023       Admit Date: 12/22/2023, Hospital Day: 2  Hospital Service: Surg[facility])  Attending: Tinnie Verla Dasie Quinn, MD  [facility] - [facility] General / Trauma Surgery - Trauma/CC     Assessment     Tracy Robles 58 y.o. female, PMH of  Psoriasis, Eczema, DM, HTN and  obesity  who presented to the ED on 12/22/2023 with acute onset epigastric pain and emesis. Imaging demonstrated SBO with transition point and she was admitted for operative intervention.       Interval Events:  OR yesterday. Noted to have adhesive band from the uterus causing transition point to small bowel; two areas of pinpoint perforations noted in the jejunum requiring repair.   Mildly tachycardic post op. 1L bolus as below.     Plan       Neuro:   - Acute pain:Well controlled   scheduled IV tylenol , gabapentin , PRN oxycodone , and PRN IV dilaudid  for breakthrough     CV: HTN  - HDS with mild tachycardia - 1L bolus today for suspected post op hypovolemia   - Holding home losartan , simvistatin     Resp:   - Stable on 2L [place]  - Continue pulmonary toliet: OOB and IS     Fen/GI: SBO due to adhesive disease  - S/p ex-lap 9/10, found to have adhesive band from the uterus was causing transition point to small bowel; two areas of pinpoint perforations noted in the jejunum requiring repair   - Diet: NPO with NGT to LIWS; ice chips for comfort   - Fluids: LR @ 125 mL/hr  - Zofran  as needed for nausea   - Replete lytes prn: none needed today   - Bowel regimen: on hold while AROBF  - IV protonix  for GI ppx while NGT in place   - Provena VAC down ~9/14    GU:   - Borderline UOP   - Cr increased to 1.3 from 1.05, suspect pre-renal etiology; 1L bolus today as above  - Continue Foley for strict I&O     Lines:   Patient Lines/Drains/Airways Status       Active Peripheral & Central Intravenous Access       Name Placement date Placement time Site Days    Peripheral IV 12/22/23 Right Antecubital 12/22/23  1037  Antecubital  less than 1                     Heme  - Hgb WNL     ID:   - Afebrile.  - WBC- No leukocytosis..   - Antibiotic Course:   Levaquin /Flagyl  started on 09/10 for total course of 4 days for intra-abdominal contamination      Endocrine: DM  - SSI  - Holding home Jardiance , Mounjaro      PPx:   lovenox  daily - dosing confirmed with pharmacist given elevated Cr; continue to monitor closely and if continues to rise may need to switch to subcutaneous heparin     Lines:   Patient Lines/Drains/Airways Status       Active Peripheral & Central Intravenous Access       Name Placement date Placement time Site Days    Peripheral IV 12/22/23 Right Antecubital 12/22/23  1037  Antecubital  less than 1  Dispo: Floor status  PT: not indicated  OT: not indicated  ST: not indicated  Barriers to discharge: awaiting return of bowel function  CM/SW assisting in discharge planning.     Contact: [facility] Pg. [phone]  Suzen DEL Dajae Kizer, AGNP      Subjective     No acute events overnight.      Objective     Vitals:   Temp:  [36 ??C (96.8 ??F)-36.7 ??C (98.1 ??F)] 36.5 ??C (97.7 ??F)  Pulse:  [100-135] 113  SpO2 Pulse:  [98-135] 114  Resp:  [15-29] 20  BP: (95-155)/(68-120) 113/80  MAP (mmHg):  [130] 130  SpO2:  [88 %-100 %] 99 %    Intake/Output last 24 hours:  I/O last 3 completed shifts:  In: 3359.9 [I.V.:3172.9; NG/GT:20; IV Piggyback:167]  Out: 800 [Urine:450; Emesis/NG output:300; Blood:50]    Physical Exam:    -General:  Appropriate, comfortable and in no apparent distress.   -Neurological: Alert and oriented with proper conversation. Moves all 4 extremities spontaneously.   -Psych: speech appropriate, pleasant affect  -HEENT:  NGT in place with brown/bilious output   -Cardiovascular: Regular rate and rhythm, but tachycardic   -Pulmonary: Normal work of breathing. No accessory muscle use. 2L  in place   -Abdomen: Soft, non-distended, Appropriately tender to palpation post operative. Provena VAC in place.   -Genitourinary: Foley in place.   -Extremities: Warm, well perfused, normal skin turgor.      Data Review:    I have reviewed the labs and studies from the last 24 hours.    Imaging: None

## 2023-12-23 NOTE — Unmapped (Addendum)
 Caliana Spires Beckers 58 y.o. female PMHx of psoriasis, eczema, DM, HTN, and obesity who presented to the ED on 9/9 with acute onset epigastric pain and emesis. CTA abdomen demonstrating small bowel obstruction with transition point in the right lower quadrant.       #Small bowel obstruction:   The patient was taken to the OR on 12/23/2023 for diagnostic laparoscopy with laparoscopic lysis of adhesions converted to open laparotomy with repair of small bowel perforations x2. Operative findings were adhesive band from the uterus adhered to the small bowel causing small bowel obstruction with resultant upstream dilation of bowel; two small bowel perforations noted in the jejunum requiring repair; no bowel resection needed.  She tolerated the procedure well, was extubated in the OR, and received routine post-operative care before being transferred to the floor. Her diet was advanced and by discharge she was tolerating a regular diet. She was voiding adequately, ambulating independently, and her pain was controlled wth PO pain medications. She was examined by the Trauma Surgery team on the day of discharge and was deemed suitable for discharge home. She will be discharged home on POD #6 in good condition.    I spent greater than 30 minutes completing this discharge.

## 2023-12-23 NOTE — Unmapped (Signed)
 Care Management  Initial Transition Planning Assessment        Pt independent prior to admission. Mom lives w/ pt. Has family/sister close by who can assist pt if needed. Pt uses CPAP at home. Has MPOA & advance directives in her car. Will need RW per PT/OT notes but no services.              General  Care Manager / Social Worker assessed the patient by : In person interview with patient, Medical record review, Discussion with Clinical Care team  Orientation Level: Oriented X4  Functional level prior to admission: Independent  Reason for referral: Discharge Planning    Contact/Decision Maker  Extended Emergency Contact Information  Primary Emergency Contact: McBroom,Stephanie  Mobile Phone: [phone]  Relation: Relative  Secondary Emergency Contact: seymour,Drezden Seitzinger  Mobile Phone: [phone]  Relation: Relative    Legal Next of Kin / Guardian / POA / Advance Directives     HCDM (patient stated preference): McBroom,Stephanie - Relative - [phone]    HCDM (patient stated preference): seymour,Annayah Worthley - Relative - [phone]    HCDM (patient stated preference): Miles,Jennifer - Sister - [phone]    HCDM (patient stated preference): Lonita Flank - [phone]    HCDM, First Alternate: Miles,Travis - Relative - [phone]    HCDM, Second Alternate: Robinson,Simone - Relative - [phone]    HCDM, back-up (If primary HCDM is unavailable): Teressa Sanger - Niece - [phone]    Advance Directive (Medical Treatment)  Does patient have an advance directive covering medical treatment?: Patient has advance directive covering medical treatment, copy not in chart.  Advance directive covering medical treatment not in Chart:: Copy requested from family  Reason patient does not have an advance directive covering medical treatment:: Patient does not wish to complete one at this time.    Health Care Decision Maker [HCDM] (Medical & Mental Health Treatment)  Healthcare Decision Maker: HCDM documented in the HCDM/Contact Info section.    Advance Directive (Mental Health Treatment)  Does patient have an advance directive covering mental health treatment?: Patient does not have advance directive covering mental health treatment.  Reason patient does not have an advance directive covering mental health treatment:: HCDM documented in the HCDM/Contact Info section., Patient does not wish to complete one at this time.    Readmission Information    Have you been hospitalized in the last 30 days?: No       Did the following happen with your discharge?      Patient Information Type of Residence: Mailing Address:  [address]  [place] [address]  Contacts: Accompanied by: Alone  Patient Phone Number: There are no phone numbers on file.        Medical Provider(s): Servando Hire, MD  Reason for Admission: Admitting Diagnosis:  SBO (small bowel obstruction)    (CMS-HCC) [K56.609]  Past Medical History:   has a past medical history of Allergic (Don't know), Arthritis (2011), BMI 60.0-69.9, adult (CMS-HCC) (05/29/2021), Diabetes mellitus    (CMS-HCC), Eczema, High blood pressure, and Psoriasis.  Past Surgical History:   has a past surgical history that includes pr lap,diagnostic abdomen (N/A, 12/22/2023); pr lap, surg enterolysis (N/A, 12/22/2023); pr enterotomy,non-duod,explor/bx/fb removal (N/A, 12/22/2023); and pr exploratory of abdomen (N/A, 12/22/2023).   Previous admit date: N/A    Primary Insurance- Payor: BCBS / Plan: BCBS BLUE OPTIONS/PPO/ADV ([place] ONLY) / Product Type: *No Product type* /   Secondary Insurance - Secondary Insurance  [place] [employer]*  Prescription Coverage - YES  Preferred  Pharmacy - [facility]  [facility] - [place] ([place]), [place] - [address]    Transportation home: Private vehicle car is parked at [employer].          Lives with: Family members, Parent    Type of Residence: Private residence        Location/Detail: 1 lvl w/ 6 STE w/ bilateral railings    Support Systems/Concerns: Family Members, Parent    Responsibilities/Dependents at home?: No    Home Care services in place prior to admission?: No                  Equipment Currently Used at Home: other (see comments) (CPAP)  Current HME Agency (Name/Phone #): [facility] [phone]    Currently receiving outpatient dialysis?: No       Financial Information       Need for financial assistance?: No       Social Determinants of Health  Social Drivers of Health     Food Insecurity: No Food Insecurity (12/23/2023)    Hunger Vital Sign     Worried About Running Out of Food in the Last Year: Never true     Ran Out of Food in the Last Year: Never true   Tobacco Use: Medium Risk (12/23/2023)    Patient History     Smoking Tobacco Use: Never     Smokeless Tobacco Use: Never     Passive Exposure: Current   Transportation Needs: No Transportation Needs (12/23/2023)    PRAPARE - Transportation     Lack of Transportation (Medical): No     Lack of Transportation (Non-Medical): No   Alcohol Use: Not on file   Housing: Low Risk  (12/23/2023)    Housing     Within the past 12 months, have you ever stayed: outside, in a car, in a tent, in an overnight shelter, or temporarily in someone else's home (i.e. couch-surfing)?: No     Are you worried about losing your housing?: No   Physical Activity: Not on file   Utilities: Low Risk  (04/04/2023)    Utilities     Within the past 12 months, have you been unable to get utilities (heat, electricity) when it was really needed?: No   Stress: Not on file   Interpersonal Safety: Not At Risk (12/22/2023)    Interpersonal Safety     Unsafe Where You Currently Live: No     Physically Hurt by Anyone: No     Abused by Anyone: No   Substance Use: Not on file (02/18/2023)   Intimate Partner Violence: Not on file   Social Connections: Not on file   Financial Resource Strain: Low Risk  (12/23/2023)    Overall Financial Resource Strain (CARDIA)     Difficulty of Paying Living Expenses: Not hard at all   Health Literacy: Low Risk  (12/12/2020)    Health Literacy     : Never   Internet Connectivity: Internet connectivity concern unknown (04/04/2023)    Internet Connectivity     Do you have access to internet services: Yes     How do you connect to the internet: Not on file     Is your internet connection strong enough for you to watch video on your device without major problems?: Not on file     Do you have enough data to get through the month?: Not on file  Does at least one of the devices have a camera that you can use for video chat?: Not on file       Complex Discharge Information    Is patient identified as a difficult/complex discharge?: No            Interventions:       Discharge Needs Assessment  Concerns to be Addressed: discharge planning    Clinical Risk Factors:      Barriers to taking medications: No    Prior overnight hospital stay or ED visit in last 90 days: No         Anticipated Changes Related to Illness: none    Equipment Needed After Discharge: walker, rolling    Discharge Facility/Level of Care Needs:      Readmission  Risk of Unplanned Readmission Score: UNPLANNED READMISSION SCORE: 11.02%  Predictive Model Details          11%  Factor Value    Calculated 12/23/2023 16:04 31% Number of active inpatient medication orders 29    [place] Risk of Unplanned Readmission Model 14% ECG/EKG order present in last 6 months     10% Imaging order present in last 6 months     9% Phosphorous result present     8% Number of ED visits in last six months 1     7% Age 39     7% Active anticoagulant inpatient medication order present     7% Latest creatinine high (1.30 mg/dL)     3% Future appointment scheduled     2% Charlson Comorbidity Index 1     2% Current length of stay 1.022 days     1% Active ulcer inpatient medication order present      Readmitted Within the Last 30 Days? (No if blank)   Patient at risk for readmission?: No    Discharge Plan  Screen findings are: Discharge planning needs identified or anticipated (Comment).    Expected Discharge Date:     Expected Transfer from Critical Care:      Quality data for continuing care services shared with patient and/or representative?: N/A  Patient and/or family were provided with choice of facilities / services that are available and appropriate to meet post hospital care needs?: N/A       Initial Assessment complete?: Yes

## 2023-12-23 NOTE — Unmapped (Signed)
 OCCUPATIONAL THERAPY  Evaluation (12/23/23 0841)    Patient Name:  Tracy Robles       Medical Record Number: [id number]     Date of Birth: Mar 26, 1966  Sex: Female      Post-Discharge Occupational Therapy Recommendations: Skilled OT services NOT indicated          Equipment Recommendation  OT DME Recommendations: None       OT Treatment Diagnosis:  (No acute OT needs indicated)         Assessment  Assessment: Tracy Robles is a 58 y.o. female with history of Psoriasis, Eczema, DM, HTN, obesity who presented to the ED with acute onset epigastric pain and emesis.   Pt presents with pain as primary barrier to participation, but is otherwise performing close to functional baseline. No further acute OT needs identified at this time, please re-consult if additional needs arise. Given current functional performance and strong caregiver support, recommending no follow-up OT services  to continue to promote safety and independence with ADLs and functional mobility.  and regular participation with the rehab mobility team during remainder of acute care stay. DME recommendations: none identified at this time    Problem List: Pain  Personal Factors/Comorbidities (Occupational Profile and History Review): Brief (Low)  Assessment of Occupational Performance : Balance, Endurance, Mobility, Strength  Clinical Decision Making: Low Complexity       Today's Interventions: EDUCATION: role of OT, OT POC, discharge recommendations, use of previously owned equipment (TTB) to facilitate safe and independent ADL performance upon discharge home, post-op abdominal precautions, implications of precautions during ADL activity , use of call bell to alert RN staff of needs, log roll technique to maintain precautions during bed mobility , figure four positioning during LBD/bathing ADLs, safe use of RW during functional mobility, and energy conservation strategies Pt verbalized understanding of all education and left with no questions at end of session. INTERVENTIONS: bed mobility, hallway level functional mobility , room level functional mobility, transfer training , LBD, occupational profile and evaluation , dynamic standing balance , dynamic seated balance , and activity tolerance    Activity Tolerance During Today's Session  Tolerated treatment well    Plan  Planned Frequency of Treatment:    D/C Services Weekly Frequency: D/C Services         GOALS:   Patient and Family Goals: None stated      Prognosis:  Good  Positive Indicators:  PLOF and caregiver support  Barriers to Discharge: Obesity, Pain    Subjective  Medical Updates Since Last Visit/Relevant PMH Affecting Clinical Decision Making:    Prior Functional Status PTA, pt endorses independence with ADLs and functional mobility. She lives with her mom, whom she assists with IADLs. Her sister lives within walking distance and can provide assistance as needed. Pt enjoys singing in the choir.    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      Past Medical History[1] Social History     Tobacco Use  Smoking status: Never     Passive exposure: Current    Smokeless tobacco: Never   Substance Use Topics    Alcohol use: Yes     Alcohol/week: 1.0 standard drink of alcohol     Comment: Only on Saturday      Past Surgical History[2] Family History[3]     Lisinopril, Latex, and Penicillins     Objective Findings  Precautions / Restrictions  Other precautions, Falls precautions (abdominal)       Weight Bearing Non-applicable    Required Braces or Orthoses  Non-applicable    Communication Preference  Verbal       Pain  Pt endorsing 8-9/10 pain; activity adjusted per tolerance    Equipment / Environment  Vascular access (PIV, TLC, Port-a-cath, PICC), Wound Vac         Cognition   Orientation Level:  Oriented x 4   Arousal/Alertness:  Appropriate responses to stimuli   Attention Span:  Appears intact   Memory:  Appears intact   Following Commands:  Follows all commands without difficulty   Safety Judgment:  Good awareness of safety precautions   Awareness of Errors and Problem Solving:  Patient self-corrected errors   Comments:      Vision / Hearing   Vision: No acute deficits identified, Glasses not present, Wears glasses all the time  Vision Comments: Denied changes in vision; pt reports her sister has her glasses  Hearing: No deficit identified         Hand Function:  Right Hand Function: Right hand grip strength, ROM and coordination WNL  Left Hand Function: Left hand grip strength, ROM and coordination WNL  Hand Dominance: Right    Skin Inspection:  Skin Inspection: Intact where visualized    Face/Cervical ROM:  Face ROM: WFL  Cervical ROM: WFL    ROM / Strength:  UE ROM/Strength: Right WFL, Left WFL  UE ROM/ Strength Comment: Generalized weakness  LE ROM/Strength: Right WFL, Left WFL  LE ROM/ Strength Comment: Generalized weakness    Coordination:  Coordination: Not tested    Sensation:  RUE Sensation: RUE intact  LUE Sensation: LUE intact  RLE Sensation: RLE intact  LLE Sensation: LLE intact  Sensory/ Proprioception/ Stereognosis comments: Denied N/T    Balance:  Animal nutritionist of Assistance: Independent  Dynamic Sitting-Level of Assistance: Archivist Standing-Level of Assistance: Supervision  Dynamic Standing - Level of Assistance: Stand by assistance    Functional Mobility  Transfers: Standby assist  Bed Mobility - Needs Assistance: Modified Independent  Ambulation: Pt completed hall level functional mobility with SBA + RW    ADLs  Feeding : Independent  Grooming: Independent, Performed seated  Bathing: Performed seated, Set Up Assist  Toileting: Performed seated, Modified Independent  UB Dressing: Set Up Assist, Performed seated  LB Dressing: Mod assist, Performed seated  IADLs: NT    Vitals / Orthostatics  Vitals/Orthostatics: NAD    Patient at end of session: All needs in reach, In bed, Lines intact, Notified Nurse, Staff present (RN present)     Occupational Therapy Session Duration  OT Individual [mins]: 10  OT Co-Treatment [mins]: 18 (PT Caitlyn Crandall)  Reason for Co-treatment: To safely progress mobility       AM-PAC-Daily Activity  Lower Body Dressing assistance needs: A lot - Maximum/Moderate Assistance  Bathing assistance needs: A Little - Minimal/Contact Guard Assist/Supervision  Toileting assistance needs: A Little - Minimal/Contact Guard Assist/Supervision  Upper Body Dressing assistance needs: None - Modified  Independent/Independent  Personal Grooming assistance needs: None - Modified Independent/Independent  Eating Meals assistance needs: None - Modified Independent/Independent    Daily Activity Score: 20    Score (in points): % of Functional Impairment, Limitation, Restriction  6: 100% impaired, limited, restricted  7-8: At least 80%, but less than 100% impaired, limited restricted  9-13: At least 60%, but less than 80% impaired, limited restricted  14-19: At least 40%, but less than 60% impaired, limited restricted  20-22: At least 20%, but less than 40% impaired, limited restricted  23: At least 1%, but less than 20% impaired, limited restricted  24: 0% impaired, limited restricted      I attest that I have reviewed the above information.  Signed: Waddell Marshall, OT  Filed 12/23/2023                 [1]   Past Medical History:  Diagnosis Date    Allergic Don't know    Penicillin    Arthritis 2011    Lower back    BMI 60.0-69.9, adult (CMS-HCC) 05/29/2021    Diabetes mellitus (CMS-HCC)     Eczema     High blood pressure     Psoriasis    [2] No past surgical history on file.  [3]   Family History  Problem Relation Age of Onset    Diabetes Father     Hypertension Father     Kidney disease Father     Clotting disorder Father         Blood clot    Asthma Father     Diabetes Sister     Miscarriages / Stillbirths Sister     Hypertension Mother     Arthritis Mother     Breast cancer Maternal Aunt     Stroke Maternal Grandmother     Miscarriages / Stillbirths Maternal Grandmother     Melanoma Neg Hx     Basal cell carcinoma Neg Hx     Squamous cell carcinoma Neg Hx     Ovarian cancer Neg Hx

## 2023-12-24 LAB — CBC W/ AUTO DIFF
BASOPHILS ABSOLUTE COUNT: 0 10*9/L (ref 0.0–0.1)
BASOPHILS RELATIVE PERCENT: 0.2 %
EOSINOPHILS ABSOLUTE COUNT: 0 10*9/L (ref 0.0–0.5)
EOSINOPHILS RELATIVE PERCENT: 0.1 %
HEMATOCRIT: 31.6 % — ABNORMAL LOW (ref 34.0–44.0)
HEMOGLOBIN: 10.4 g/dL — ABNORMAL LOW (ref 11.3–14.9)
LYMPHOCYTES ABSOLUTE COUNT: 1 10*9/L — ABNORMAL LOW (ref 1.1–3.6)
LYMPHOCYTES RELATIVE PERCENT: 10.2 %
MEAN CORPUSCULAR HEMOGLOBIN CONC: 33 g/dL (ref 32.0–36.0)
MEAN CORPUSCULAR HEMOGLOBIN: 29 pg (ref 25.9–32.4)
MEAN CORPUSCULAR VOLUME: 87.8 fL (ref 77.6–95.7)
MEAN PLATELET VOLUME: 9.8 fL (ref 6.8–10.7)
MONOCYTES ABSOLUTE COUNT: 0.8 10*9/L (ref 0.3–0.8)
MONOCYTES RELATIVE PERCENT: 7.8 %
NEUTROPHILS ABSOLUTE COUNT: 8.4 10*9/L — ABNORMAL HIGH (ref 1.8–7.8)
NEUTROPHILS RELATIVE PERCENT: 81.7 %
PLATELET COUNT: 187 10*9/L (ref 150–450)
RED BLOOD CELL COUNT: 3.61 10*12/L — ABNORMAL LOW (ref 3.95–5.13)
RED CELL DISTRIBUTION WIDTH: 14.9 % (ref 12.2–15.2)
WBC ADJUSTED: 10.3 10*9/L (ref 3.6–11.2)

## 2023-12-24 LAB — BASIC METABOLIC PANEL
ANION GAP: 14 mmol/L (ref 5–14)
BLOOD UREA NITROGEN: 17 mg/dL (ref 9–23)
BUN / CREAT RATIO: 16
CALCIUM: 8.4 mg/dL — ABNORMAL LOW (ref 8.7–10.4)
CHLORIDE: 103 mmol/L (ref 98–107)
CO2: 26 mmol/L (ref 20.0–31.0)
CREATININE: 1.06 mg/dL — ABNORMAL HIGH (ref 0.55–1.02)
EGFR CKD-EPI (2021) FEMALE: 61 mL/min/1.73m2 (ref >=60)
GLUCOSE RANDOM: 116 mg/dL (ref 70–179)
POTASSIUM: 4 mmol/L (ref 3.4–4.8)
SODIUM: 143 mmol/L (ref 135–145)

## 2023-12-24 LAB — PHOSPHORUS: PHOSPHORUS: 2.7 mg/dL (ref 2.4–5.1)

## 2023-12-24 LAB — LMWH ANTI-XA: HEPARIN LOW MOLECULAR WEIGHT: 0.36 [IU]/mL

## 2023-12-24 LAB — MAGNESIUM: MAGNESIUM: 1.9 mg/dL (ref 1.6–2.6)

## 2023-12-24 MED ADMIN — oxyCODONE (ROXICODONE) 5 mg/5 mL solution 5 mg: 5 mg | ORAL | @ 08:00:00 | Stop: 2024-01-06

## 2023-12-24 MED ADMIN — lactated ringers bolus 1,000 mL: 1000 mL | INTRAVENOUS | @ 12:00:00 | Stop: 2023-12-24

## 2023-12-24 MED ADMIN — metroNIDAZOLE (FLAGYL) IVPB 500 mg: 500 mg | INTRAVENOUS | @ 01:00:00 | Stop: 2023-12-27

## 2023-12-24 MED ADMIN — metroNIDAZOLE (FLAGYL) IVPB 500 mg: 500 mg | INTRAVENOUS | @ 08:00:00 | Stop: 2023-12-27

## 2023-12-24 MED ADMIN — levoFLOXacin (LEVAQUIN) 500 mg/100 mL IVPB 500 mg: 500 mg | INTRAVENOUS | @ 17:00:00 | Stop: 2023-12-27

## 2023-12-24 MED ADMIN — gabapentin (NEURONTIN) oral solution: 300 mg | ORAL | @ 20:00:00

## 2023-12-24 MED ADMIN — methocarbamol (ROBAXIN) 1,000 mg in sodium chloride (NS) 0.9 % 50 mL IVPB: 1000 mg | INTRAVENOUS | @ 04:00:00 | Stop: 2023-12-25

## 2023-12-24 MED ADMIN — metroNIDAZOLE (FLAGYL) IVPB 500 mg: 500 mg | INTRAVENOUS | @ 16:00:00 | Stop: 2023-12-27

## 2023-12-24 MED ADMIN — methocarbamol (ROBAXIN) 1,000 mg in sodium chloride (NS) 0.9 % 50 mL IVPB: 1000 mg | INTRAVENOUS | @ 23:00:00 | Stop: 2023-12-25

## 2023-12-24 MED ADMIN — lactated ringers bolus 1,000 mL: 1000 mL | INTRAVENOUS | @ 05:00:00 | Stop: 2023-12-24

## 2023-12-24 MED ADMIN — acetaminophen (OFIRMEV) 10 mg/mL injection 1,000 mg: 1000 mg | INTRAVENOUS | @ 14:00:00 | Stop: 2023-12-25

## 2023-12-24 MED ADMIN — pantoprazole (Protonix) injection 40 mg: 40 mg | INTRAVENOUS | @ 14:00:00

## 2023-12-24 MED ADMIN — acetaminophen (OFIRMEV) 10 mg/mL injection 1,000 mg: 1000 mg | INTRAVENOUS | @ 22:00:00 | Stop: 2023-12-25

## 2023-12-24 MED ADMIN — oxyCODONE (ROXICODONE) 5 mg/5 mL solution 5 mg: 5 mg | ORAL | @ 01:00:00 | Stop: 2024-01-06

## 2023-12-24 MED ADMIN — magnesium sulfate in D5W 1 gram/100 mL infusion 1 g: 1 g | INTRAVENOUS | @ 20:00:00 | Stop: 2023-12-24

## 2023-12-24 MED ADMIN — enoxaparin (LOVENOX) syringe 40 mg: 40 mg | SUBCUTANEOUS | @ 14:00:00

## 2023-12-24 MED ADMIN — gabapentin (NEURONTIN) oral solution: 300 mg | ORAL | @ 10:00:00

## 2023-12-24 MED ADMIN — lidocaine (ASPERCREME) 4 % 1 patch: 1 | TRANSDERMAL | @ 14:00:00

## 2023-12-24 MED ADMIN — methocarbamol (ROBAXIN) 1,000 mg in sodium chloride (NS) 0.9 % 50 mL IVPB: 1000 mg | INTRAVENOUS | @ 12:00:00 | Stop: 2023-12-25

## 2023-12-24 MED ADMIN — HYDROmorphone (DILAUDID) injection Syrg 0.5 mg: .5 mg | INTRAVENOUS | @ 10:00:00 | Stop: 2024-01-05

## 2023-12-24 MED ADMIN — gabapentin (NEURONTIN) oral solution: 300 mg | ORAL | @ 01:00:00

## 2023-12-24 MED ADMIN — lactated Ringers infusion: 125 mL/h | INTRAVENOUS | @ 16:00:00

## 2023-12-24 MED ADMIN — acetaminophen (OFIRMEV) 10 mg/mL injection 1,000 mg: 1000 mg | INTRAVENOUS | Stop: 2023-12-23

## 2023-12-24 NOTE — Unmapped (Signed)
 Problem: Wound  Goal: Optimal Coping  Outcome: Shift Focus  Goal: Optimal Functional Ability  Outcome: Shift Focus     Problem: Skin Injury Risk Increased  Goal: Skin Health and Integrity  Outcome: Shift Focus  Intervention: Optimize Skin Protection  Recent Flowsheet Documentation  Taken 12/24/2023 0200 by Eilleen Heritage, RN  Pressure Reduction Techniques: frequent weight shift encouraged  Taken 12/24/2023 0000 by Eilleen Heritage, RN  Pressure Reduction Techniques: frequent weight shift encouraged  Taken 12/23/2023 2200 by Eilleen Heritage, RN  Pressure Reduction Techniques: frequent weight shift encouraged  Taken 12/23/2023 2000 by Eilleen Heritage, RN  Pressure Reduction Techniques: frequent weight shift encouraged  Pressure Reduction Devices: pressure-redistributing mattress utilized     Problem: Adult Inpatient Plan of Care  Goal: Patient-Specific Goal (Individualized)  Outcome: Shift Focus  Goal: Optimal Comfort and Wellbeing  Outcome: Shift Focus   Shift Summary  Pain management interventions included administration of acetaminophen , oxyCODONE , and methocarbamol , with ongoing assessment of comfort needs.   Skin integrity was supported by frequent repositioning, absorbent pad changes, and use of pressure-redistributing devices, with all wounds and IV sites remaining clean and intact.   Psychosocial and coping needs were addressed through engagement in entertainment activities and maintenance of a supportive environment.   Glucose and temperature remained within normal limits during the shift.   Overall, the shift was focused on pain control, skin protection, and supporting coping, with no new complications documented.    Optimal Coping: Engaged in entertainment activities such as watching television throughout the shift, and psychosocial status remained within defined limits, suggesting maintained coping abilities.    Optimal Functional Ability: Positioning was alternated between supine/back and left side, and pillows were used for support, which may have supported mobility and comfort needs during the shift.    Skin Health and Integrity: Frequent weight shifts, absorbent pad changes, and use of a pressure-redistributing mattress were maintained; all documented wounds and IV sites remained clean, dry, and intact with no interventions needed for dressings.    Optimal Comfort and Wellbeing: Pain was reported as 8/10 early in the shift and acetaminophen , oxyCODONE , and methocarbamol  were administered, which may have contributed to pain management; temperature remained within normal range and glucose levels were stable.

## 2023-12-24 NOTE — Unmapped (Signed)
 Shift Summary  Negative pressure wound therapy for midline abdominal wound vac was maintained with no drainage noted, and frequent weight shifts and pressure-redistributing mattress were utilized throughout the shift.  Pain management included multiple administrations of methocarbamol  and acetaminophen , supporting comfort and wellbeing.  Incentive spirometer instruction and use were reinforced, with documented repetitions and volume achieved.  Overall, skin integrity and comfort measures were consistently supported during the shift.    Optimal Coping: Engaged in entertainment activities throughout the shift, and psychosocial status remained within defined limits, suggesting maintained coping ability.    Skin Health and Integrity: Midline abdominal wound vac remained in place with continuous negative pressure therapy and no drainage noted; frequent weight shifts and pressure-redistributing mattress were consistently utilized, and perineal care was provided after catheter care.    Optimal Comfort and Wellbeing: Pain management interventions included administration of methocarbamol  and acetaminophen , and comfort measures were maintained with regular positioning and entertainment activities.

## 2023-12-24 NOTE — Unmapped (Signed)
 Tracy Robles, ACUTE CARE, and GENERAL SURGERY   - Surgery Daily Progress Note -  12/24/2023       Admit Date: 12/22/2023, Hospital Day: 3  Hospital Service: [facility])  Attending: Tinnie Verla Dasie Quinn, MD  [facility] - [facility] General / Robles Surgery - Robles/CC     Assessment     Tracy Robles 58 y.o. female, PMH of  Psoriasis, Eczema, DM, HTN and  obesity  who presented to the ED on 12/22/2023 with acute onset epigastric pain and emesis. Imaging demonstrated SBO with transition point and she was admitted for operative intervention.       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 was causing transition point to small bowel; two areas of pinpoint perforations noted in the jejunum requiring repair   - Diet: NPO with NGT to LIWS; ice chips for comfort, will place to straight drain today   - Fluids: LR @ 125 mL/hr, ML when taking PO   - Zofran  as needed for nausea   - Replete lytes prn: repleted Mg goal of 2   - Bowel regimen: on hold while AROBF  - IV protonix  for GI ppx while NGT in place   - Provena VAC down ~9/14    GU:   - Borderline UOP   - Cr 1.06 (1.30)   - 1L bolus 9/10 for elevated Cr- pre renal etiology   - Continue Foley     Lines:   Patient Lines/Drains/Airways Status       Active Peripheral & Central Intravenous Access       Name Placement date Placement time Site Days    Peripheral IV 12/22/23 Right Antecubital 12/22/23  1037  Antecubital  1                     Heme  - Hgb stable     ID:   - Afebrile.  - WBC- No leukocytosis..   - Antibiotic Course:   > Levaquin /Flagyl  started on 09/10 for total course of 4 days for intra-abdominal contamination      Endocrine:   #DM  - SSI  - Holding home Jardiance , Mounjaro      PPx:   lovenox  BID iso BMI - dosing confirmed with pharmacist given elevated Cr; continue to monitor closely and if continues to rise may need to switch to subcutaneous heparin    Lines:   Patient Lines/Drains/Airways Status       Active Peripheral & Central Intravenous Access       Name Placement date Placement time Site Days    Peripheral IV 12/22/23 Right Antecubital 12/22/23  1037  Antecubital  1                       Dispo: Floor status  PT: not  indicated  OT: not indicated  ST: not indicated  Barriers to discharge: awaiting return of bowel function  CM/SW assisting in discharge planning.     Contact: [facility] Pg. [phone]  Dedra Freida Grippe, ACNP      Subjective     No acute events overnight.      Objective     Vitals:   Temp:  [36.5 ??C (97.7 ??F)-37.4 ??C (99.3 ??F)] 37.2 ??C (99 ??F)  Pulse:  [119-132] 124  SpO2 Pulse:  [121-128] 128  Resp:  [14-18] 18  BP: (106-136)/(60-78) 118/62  MAP (mmHg):  [74-95] 78  SpO2:  [96 %-98 %] 96 %    Intake/Output last 24 hours:  I/O last 3 completed shifts:  In: 3529.9 [P.O.:100; I.V.:3172.9; NG/GT:90; IV Piggyback:167]  Out: 1775 [Urine:1075; Emesis/NG output:650; Blood:50]    Physical Exam:    -General:  Appropriate, comfortable and in no apparent distress.   -Neurological: Alert and oriented with proper conversation. Moves all 4 extremities spontaneously.   -Psych: speech appropriate, pleasant affect  -HEENT:  NGT in place with bilious output   -Cardiovascular: Regular rate and rhythm  -Pulmonary: Normal work of breathing. No accessory muscle use. RA   -Abdomen: Soft, non-distended, Appropriately tender to palpation post operative. Provena VAC in place.   -Genitourinary: Foley in place.   -Extremities: Warm, well perfused, normal skin turgor.      Data Review:    I have reviewed the labs and studies from the last 24 hours.    Imaging: None

## 2023-12-25 LAB — CBC W/ AUTO DIFF
BASOPHILS ABSOLUTE COUNT: 0 10*9/L (ref 0.0–0.1)
BASOPHILS RELATIVE PERCENT: 0.1 %
EOSINOPHILS ABSOLUTE COUNT: 0 10*9/L (ref 0.0–0.5)
EOSINOPHILS RELATIVE PERCENT: 0.4 %
HEMATOCRIT: 28.8 % — ABNORMAL LOW (ref 34.0–44.0)
HEMOGLOBIN: 9.5 g/dL — ABNORMAL LOW (ref 11.3–14.9)
LYMPHOCYTES ABSOLUTE COUNT: 1 10*9/L — ABNORMAL LOW (ref 1.1–3.6)
LYMPHOCYTES RELATIVE PERCENT: 9.5 %
MEAN CORPUSCULAR HEMOGLOBIN CONC: 33 g/dL (ref 32.0–36.0)
MEAN CORPUSCULAR HEMOGLOBIN: 29 pg (ref 25.9–32.4)
MEAN CORPUSCULAR VOLUME: 87.8 fL (ref 77.6–95.7)
MEAN PLATELET VOLUME: 9.7 fL (ref 6.8–10.7)
MONOCYTES ABSOLUTE COUNT: 0.6 10*9/L (ref 0.3–0.8)
MONOCYTES RELATIVE PERCENT: 6 %
NEUTROPHILS ABSOLUTE COUNT: 8.9 10*9/L — ABNORMAL HIGH (ref 1.8–7.8)
NEUTROPHILS RELATIVE PERCENT: 84 %
PLATELET COUNT: 179 10*9/L (ref 150–450)
RED BLOOD CELL COUNT: 3.28 10*12/L — ABNORMAL LOW (ref 3.95–5.13)
RED CELL DISTRIBUTION WIDTH: 14.8 % (ref 12.2–15.2)
WBC ADJUSTED: 10.6 10*9/L (ref 3.6–11.2)

## 2023-12-25 LAB — BASIC METABOLIC PANEL
ANION GAP: 14 mmol/L (ref 5–14)
BLOOD UREA NITROGEN: 11 mg/dL (ref 9–23)
BUN / CREAT RATIO: 14
CALCIUM: 8.5 mg/dL — ABNORMAL LOW (ref 8.7–10.4)
CHLORIDE: 104 mmol/L (ref 98–107)
CO2: 24 mmol/L (ref 20.0–31.0)
CREATININE: 0.78 mg/dL (ref 0.55–1.02)
EGFR CKD-EPI (2021) FEMALE: 89 mL/min/1.73m2 (ref >=60)
GLUCOSE RANDOM: 83 mg/dL (ref 70–179)
POTASSIUM: 3.8 mmol/L (ref 3.4–4.8)
SODIUM: 142 mmol/L (ref 135–145)

## 2023-12-25 LAB — MAGNESIUM: MAGNESIUM: 2.3 mg/dL (ref 1.6–2.6)

## 2023-12-25 LAB — PHOSPHORUS: PHOSPHORUS: 2.3 mg/dL — ABNORMAL LOW (ref 2.4–5.1)

## 2023-12-25 MED ADMIN — melatonin tablet 3 mg: 3 mg | ORAL

## 2023-12-25 MED ADMIN — HYDROmorphone (DILAUDID) injection Syrg 0.5 mg: .5 mg | INTRAVENOUS | Stop: 2024-01-05

## 2023-12-25 MED ADMIN — metroNIDAZOLE (FLAGYL) IVPB 500 mg: 500 mg | INTRAVENOUS | Stop: 2023-12-27

## 2023-12-25 MED ADMIN — methocarbamol (ROBAXIN) 1,000 mg in sodium chloride (NS) 0.9 % 50 mL IVPB: 1000 mg | INTRAVENOUS | @ 12:00:00 | Stop: 2023-12-25

## 2023-12-25 MED ADMIN — acetaminophen (OFIRMEV) 10 mg/mL injection 1,000 mg: 1000 mg | INTRAVENOUS | @ 06:00:00 | Stop: 2023-12-25

## 2023-12-25 MED ADMIN — lidocaine (ASPERCREME) 4 % 1 patch: 1 | TRANSDERMAL | @ 12:00:00

## 2023-12-25 MED ADMIN — oxyCODONE (ROXICODONE) 5 mg/5 mL solution 5 mg: 5 mg | ORAL | @ 01:00:00 | Stop: 2024-01-06

## 2023-12-25 MED ADMIN — metroNIDAZOLE (FLAGYL) IVPB 500 mg: 500 mg | INTRAVENOUS | @ 09:00:00 | Stop: 2023-12-27

## 2023-12-25 MED ADMIN — methocarbamol (ROBAXIN) 1,000 mg in sodium chloride (NS) 0.9 % 50 mL IVPB: 1000 mg | INTRAVENOUS | @ 21:00:00 | Stop: 2023-12-25

## 2023-12-25 MED ADMIN — melatonin tablet 3 mg: 3 mg | ORAL | @ 01:00:00

## 2023-12-25 MED ADMIN — gabapentin (NEURONTIN) oral solution: 300 mg | ORAL | @ 17:00:00

## 2023-12-25 MED ADMIN — methocarbamol (ROBAXIN) 1,000 mg in sodium chloride (NS) 0.9 % 50 mL IVPB: 1000 mg | INTRAVENOUS | @ 04:00:00 | Stop: 2023-12-25

## 2023-12-25 MED ADMIN — metroNIDAZOLE (FLAGYL) IVPB 500 mg: 500 mg | INTRAVENOUS | @ 01:00:00 | Stop: 2023-12-27

## 2023-12-25 MED ADMIN — gabapentin (NEURONTIN) oral solution: 300 mg | ORAL | @ 10:00:00

## 2023-12-25 MED ADMIN — enoxaparin (LOVENOX) syringe 40 mg: 40 mg | SUBCUTANEOUS | @ 12:00:00

## 2023-12-25 MED ADMIN — levoFLOXacin (LEVAQUIN) 500 mg/100 mL IVPB 500 mg: 500 mg | INTRAVENOUS | @ 17:00:00 | Stop: 2023-12-27

## 2023-12-25 MED ADMIN — enoxaparin (LOVENOX) syringe 40 mg: 40 mg | SUBCUTANEOUS | @ 01:00:00

## 2023-12-25 MED ADMIN — lactated Ringers infusion: 125 mL/h | INTRAVENOUS | @ 16:00:00

## 2023-12-25 MED ADMIN — gabapentin (NEURONTIN) oral solution: 300 mg | ORAL | @ 01:00:00

## 2023-12-25 MED ADMIN — metroNIDAZOLE (FLAGYL) IVPB 500 mg: 500 mg | INTRAVENOUS | @ 16:00:00 | Stop: 2023-12-27

## 2023-12-25 MED ADMIN — oxyCODONE (ROXICODONE) 5 mg/5 mL solution 5 mg: 5 mg | ORAL | @ 15:00:00 | Stop: 2024-01-06

## 2023-12-25 NOTE — Unmapped (Signed)
 Shift Summary  Incision and abdominal wounds remained clean, dry, and intact, with ongoing use of pressure reduction devices and absorbent pads.  Psychosocial status and sensorimotor function were documented as within defined limits, with safety interventions maintained throughout the shift.  Glucose levels remained within a low-normal range during the shift.  Overall, pain was addressed and skin integrity was maintained during the shift.    Optimal Coping: Psychosocial status remained within defined limits throughout the shift, with no documented changes in coping or mood.    Skin Health and Integrity: Incision and abdominal wounds remained clean, dry, and intact, with frequent absorbent pad changes and use of pressure-redistributing mattress and pillows; skin color and moisture were appropriate and no apparent friction or shear problems were noted.    Optimal Comfort and Wellbeing: Abdominal pain was reported as 8/10 early in the shift and interventions included oxyCODONE , methocarbamol , and acetaminophen ; patient was sleeping during reassessment and no further pain scores were documented after interventions.

## 2023-12-25 NOTE — Unmapped (Signed)
 Organ TRAUMA, ACUTE CARE, and GENERAL SURGERY   - Surgery Daily Progress Note -  12/25/2023       Admit Date: 12/22/2023, Hospital Day: 4  Hospital Service: [facility])  Attending: Tinnie Verla Dasie Quinn, MD  [facility] - [facility] General / Trauma Surgery - Trauma/CC     Assessment     Tracy Robles 58 y.o. female, PMH of  Psoriasis, Eczema, DM, HTN and  obesity  who presented to the ED on 12/22/2023 with acute onset epigastric pain and emesis. Imaging demonstrated SBO with transition point and she was admitted for operative intervention.       Interval Events:  NG tube gravity trial yesterday passed and NG was removed this morning.  TOP yesterday and Foley removed.  Patient reports pain is under good control.  Denies any bowel movements or flatulence.    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 was causing transition point to small bowel; two areas of pinpoint perforations noted in the jejunum requiring repair   - Diet: Clear liquid diet s/p NGT removal on 9/12  - Fluids: LR @ 125 mL/hr  - Zofran  as needed for nausea   - Replete lytes prn: none needed today   - Bowel regimen: on hold while AROBF  - IV protonix  for GI ppx stopped on 9/12  - Provena VAC down ~9/14    GU:   - Borderline UOP   - Cr returned to baseline and is 0.78  - Continue Foley for strict I&O     Lines:   Patient Lines/Drains/Airways Status       Active Peripheral & Central Intravenous Access       Name Placement date Placement time Site Days    Peripheral IV 12/22/23 Right Antecubital 12/22/23  1037  Antecubital  2                     Heme  - Hgb WNL     ID:   - Afebrile.  - WBC- No leukocytosis..   - Antibiotic Course:   Levaquin /Flagyl  started on 09/10 for total course of 4 days for intra-abdominal contamination - until 9/14    Endocrine: DM  - SSI  - Holding home Jardiance , Mounjaro      PPx:   lovenox  daily     Lines:   Patient Lines/Drains/Airways Status       Active Peripheral & Central Intravenous Access       Name Placement date Placement time Site Days    Peripheral IV 12/22/23 Right Antecubital 12/22/23  1037  Antecubital  2                       Dispo: Floor status  PT: not indicated  OT: not indicated  ST: not indicated  Barriers to discharge: awaiting return of bowel function  CM/SW assisting in discharge planning.     Contact: [facility] Pg. [phone]  Bernardino SHAUNNA Aly, MD      Subjective  No acute events overnight.      Objective     Vitals:   Temp:  [36.9 ??C (98.4 ??F)-37.9 ??C (100.2 ??F)] 37.1 ??C (98.8 ??F)  Pulse:  [107-124] 113  Resp:  [17-18] 17  BP: (118-146)/(62-89) 146/89  MAP (mmHg):  [78-106] 106  SpO2:  [96 %-97 %] 96 %    Intake/Output last 24 hours:  I/O last 3 completed shifts:  In: 1835 [P.O.:60; I.V.:1755; NG/GT:20]  Out: 1800 [Urine:1250; Emesis/NG output:550]    Physical Exam:    -General:  Appropriate, comfortable and in no apparent distress.   -Neurological: Alert and oriented with proper conversation. Moves all 4 extremities spontaneously.   -Psych: speech appropriate, pleasant affect  -HEENT: Normocephalic and atraumatic.  NG tube removed  -Cardiovascular: Regular rate and rhythm, but tachycardic   -Pulmonary: Normal work of breathing. No accessory muscle use.  -Abdomen: Soft, non-distended, Appropriately tender to palpation post operative. Provena VAC in place.   -Genitourinary: Not assessed  -Extremities: Warm, well perfused, normal skin turgor.      Data Review:    I have reviewed the labs and studies from the last 24 hours.    Imaging: None

## 2023-12-25 NOTE — Unmapped (Signed)
 Adult Nutrition Assessment Note    Visit Type: RD Risk Alert  Reason for Visit: Other (Specify) (NPO/CLD day 3)    NUTRITION INTERVENTIONS and RECOMMENDATION     Advance diet as medically appropriate  If unable to advance past CLD with good tolerance within 2-3 days, consider starting TPN  Recommend phos repletion until WNL  Obtain admit weight, weekly weight throughout admission    NUTRITION ASSESSMENT     Current  nutrition therapy is appropriate although not meeting nutritional needs at this time due to clear liquid diet.   No phos repletion documented in [facility] for low lab at this time    NUTRITIONALLY RELEVANT DATA     HPI & PMH:   Per provider note: 58 y.o. female, PMH of  Psoriasis, Eczema, DM, HTN and  obesity  who presented to the ED on 12/22/2023 with acute onset epigastric pain and emesis. Imaging demonstrated SBO with transition point and she was admitted for operative intervention.     Nutrition History:   Pt is currently day 3 NPO/CLD. NG was in place for LIWS, but removed today. Diet advanced to CLD today as well. Pt on phone x 2 visit attempts and requested RD return at later time. Pt previously following with outpatient RD. Last visit noted on 09/03/22. Working on healthy eating habits such as optimizing protein intake, increasing vegetable consumption, and limiting sugar sweetened beverages. Most recent BM was 9/8.     Medications:  Nutritionally pertinent medications reviewed and evaluated for potential food and/or medication interactions. Include: insulin  lispro (not given), flagyl   Infusions Meds[1]    Labs:   Nutritionally pertinent labs reviewed.   Lab Results   Component Value Date    NA 142 12/25/2023    K 3.8 12/25/2023    CL 104 12/25/2023    CO2 24.0 12/25/2023    BUN 11 12/25/2023    CREATININE 0.78 12/25/2023    GLU 83 12/25/2023    CALCIUM 8.5 (L) 12/25/2023    ALBUMIN 4.8 12/22/2023    PHOS 2.3 (L) 12/25/2023      Recent Labs     Units 12/25/23  0436   MG mg/dL 2.3       Lab Results Component Value Date    ALKPHOS 65 12/22/2023    BILITOT 0.9 12/22/2023    BILIDIR 0.20 08/19/2021    PROT 8.7 (H) 12/22/2023    ALBUMIN 4.8 12/22/2023    ALT 18 12/22/2023    AST 17 12/22/2023    No results for input(s): TRIG in the last 168 hours.     Nutritional Needs:   Healthy balance of carbohydrate, protein, and fat.     Anthropometric Data:  Height:  157.5 cm (5' 2)  Admission weight:  N/A  Last recorded weight: 126 kg (277 lb 12.8 oz) [11/26/23]  IBW:  50 kg (110 lb)  BMI: There is no height or weight on file to calculate BMI.   Usual Body Weight: Unable to obtain at this time   Weight Assessment:   Weight history prior to admission: Chart review shows pt with 5.5 kg loss in < 5 months. 4% decrease - not nutritionally significant. No admit wt taken.    Wt Readings from Last 10 Encounters:   11/26/23 (!) 126 kg (277 lb 12.8 oz)   07/07/23 (!) 131.5 kg (289 lb 12.8 oz)   05/14/23 (!) 127.1 kg (280 lb 3.2 oz)   05/07/23 (!) 129.7 kg (286 lb)   05/01/23 ROLLEN)  131.6 kg (290 lb 3.2 oz)   04/21/23 (!) 130.1 kg (286 lb 12.8 oz)   04/10/23 (!) 130.9 kg (288 lb 9.6 oz)   02/25/23 (!) 133.4 kg (294 lb)   02/06/23 (!) 133.4 kg (294 lb)   01/22/23 (!) 134.7 kg (296 lb 14.4 oz)      Malnutrition Assessment:  Malnutrition assessment not yet completed at this time due to lack of nutrition history and inability to complete nutrition focused physical exam (NFPE).     Nutrition Focused Physical Exam:  Unable to complete at this time due to patient availability     Care plan:  Ongoing, unable to diagnose malnutrition at this time    Current Nutrition:  Oral intake   Nutrition Orders            Supplement Adult; Ensure Clear (Clear Liquid); # of Products PER Serving: 1 2xd PC starting at 09/12 0900    Nutrition Therapy Clear Liquid starting at 09/12 0522          Nutritionally Pertinent Allergies, Intolerances, Sensitivities, and/or Cultural/Religious Restrictions:  none identified per chart review at this time     GOALS and EVALUATION     Patient to remain NPO and/or on a clear liquid diet less than 3 days before diet advancement. - New    Motivation, Barriers, and Compliance:  Evaluation of motivation, barriers, and compliance pending at this time due to clinical status.     Discharge Planning:   Monitor for potential discharge needs with multi-disciplinary team.     Follow-Up Parameters:   1-2 times per week (and more frequent as indicated)    Odella Emmer, MPH, RD, LDN  Pager: [phone]) [phone]           [1]    lactated Ringers  125 mL/hr (12/25/23 1156)

## 2023-12-25 NOTE — Unmapped (Signed)
 Shift Summary  Pain management interventions included PRN oxycodone  and two doses of methocarbamol , with pain remaining constant and described as discomfort in the abdomen.  Mid abdominal wound vac remained clean, dry, and intact, with no drainage and consistent use of pressure reduction techniques and devices throughout the shift.  Ambulation occurred in the afternoon, and mobility was slightly limited but supported by safety and fall reduction interventions.  Psychosocial status was stable, and entertainment activities were utilized for coping.  Overall, functional ability and skin integrity were supported, and comfort interventions were provided as needed.    Optimal Coping: Engaged in entertainment activities such as watching television throughout the shift, and psychosocial status remained within defined limits, suggesting maintained coping strategies.    Optimal Functional Ability: Ambulated in room during the afternoon and returned to bed later, with mobility noted as slightly limited and occasional walking documented earlier in the day.    Skin Health and Integrity: Mid abdominal wound vac remained clean, dry, and intact throughout the shift, with no drainage noted and pressure reduction techniques and devices consistently utilized; skin color and condition were appropriate and warm, and Braden Scale assessments indicated adequate nutrition and minimal moisture.    Skin Health and Integrity: Peripheral vascular status and lower left extremity sensation were within defined limits, and frequent weight shifts and fall reduction interventions were maintained to support skin integrity and safety.    Optimal Comfort and Wellbeing: Abdominal pain was constant and described as discomfort, with a pain score of 7; PRN oxycodone  was administered and methocarbamol  was given twice during the shift to address pain and comfort needs.

## 2023-12-26 LAB — CBC W/ AUTO DIFF
BASOPHILS ABSOLUTE COUNT: 0 10*9/L (ref 0.0–0.1)
BASOPHILS RELATIVE PERCENT: 0.3 %
EOSINOPHILS ABSOLUTE COUNT: 0.2 10*9/L (ref 0.0–0.5)
EOSINOPHILS RELATIVE PERCENT: 2.8 %
HEMATOCRIT: 27.2 % — ABNORMAL LOW (ref 34.0–44.0)
HEMOGLOBIN: 9.1 g/dL — ABNORMAL LOW (ref 11.3–14.9)
LYMPHOCYTES ABSOLUTE COUNT: 0.9 10*9/L — ABNORMAL LOW (ref 1.1–3.6)
LYMPHOCYTES RELATIVE PERCENT: 11.1 %
MEAN CORPUSCULAR HEMOGLOBIN CONC: 33.2 g/dL (ref 32.0–36.0)
MEAN CORPUSCULAR HEMOGLOBIN: 29 pg (ref 25.9–32.4)
MEAN CORPUSCULAR VOLUME: 87.3 fL (ref 77.6–95.7)
MEAN PLATELET VOLUME: 8.9 fL (ref 6.8–10.7)
MONOCYTES ABSOLUTE COUNT: 0.6 10*9/L (ref 0.3–0.8)
MONOCYTES RELATIVE PERCENT: 6.9 %
NEUTROPHILS ABSOLUTE COUNT: 6.7 10*9/L (ref 1.8–7.8)
NEUTROPHILS RELATIVE PERCENT: 78.9 %
PLATELET COUNT: 217 10*9/L (ref 150–450)
RED BLOOD CELL COUNT: 3.12 10*12/L — ABNORMAL LOW (ref 3.95–5.13)
RED CELL DISTRIBUTION WIDTH: 14.5 % (ref 12.2–15.2)
WBC ADJUSTED: 8.5 10*9/L (ref 3.6–11.2)

## 2023-12-26 LAB — BASIC METABOLIC PANEL
ANION GAP: 10 mmol/L (ref 5–14)
BLOOD UREA NITROGEN: 10 mg/dL (ref 9–23)
BUN / CREAT RATIO: 14
CALCIUM: 8.8 mg/dL (ref 8.7–10.4)
CHLORIDE: 100 mmol/L (ref 98–107)
CO2: 28 mmol/L (ref 20.0–31.0)
CREATININE: 0.69 mg/dL (ref 0.55–1.02)
EGFR CKD-EPI (2021) FEMALE: >90 mL/min/1.73m2 (ref >=60)
GLUCOSE RANDOM: 125 mg/dL (ref 70–179)
POTASSIUM: 4.2 mmol/L (ref 3.4–4.8)
SODIUM: 138 mmol/L (ref 135–145)

## 2023-12-26 LAB — PHOSPHORUS: PHOSPHORUS: 2.7 mg/dL (ref 2.4–5.1)

## 2023-12-26 LAB — MAGNESIUM: MAGNESIUM: 2.2 mg/dL (ref 1.6–2.6)

## 2023-12-26 MED ADMIN — gabapentin (NEURONTIN) oral solution: 300 mg | ORAL | @ 01:00:00

## 2023-12-26 MED ADMIN — oxyCODONE (ROXICODONE) 5 mg/5 mL solution 10 mg: 10 mg | ORAL | @ 01:00:00 | Stop: 2024-01-06

## 2023-12-26 MED ADMIN — diphenhydrAMINE (BENADRYL) capsule/tablet 25 mg: 25 mg | ORAL | @ 14:00:00

## 2023-12-26 MED ADMIN — oxyCODONE (ROXICODONE) 5 mg/5 mL solution 10 mg: 10 mg | ORAL | @ 05:00:00 | Stop: 2024-01-06

## 2023-12-26 MED ADMIN — metroNIDAZOLE (FLAGYL) IVPB 500 mg: 500 mg | INTRAVENOUS | @ 08:00:00 | Stop: 2023-12-27

## 2023-12-26 MED ADMIN — lactated Ringers infusion: 125 mL/h | INTRAVENOUS | @ 22:00:00

## 2023-12-26 MED ADMIN — topiramate (Topamax) tablet 25 mg: 25 mg | ORAL | @ 01:00:00

## 2023-12-26 MED ADMIN — gabapentin (NEURONTIN) oral solution: 300 mg | ORAL | @ 09:00:00

## 2023-12-26 MED ADMIN — lactated Ringers infusion: 125 mL/h | INTRAVENOUS | @ 01:00:00

## 2023-12-26 MED ADMIN — enoxaparin (LOVENOX) syringe 40 mg: 40 mg | SUBCUTANEOUS | @ 14:00:00

## 2023-12-26 MED ADMIN — lidocaine (ASPERCREME) 4 % 1 patch: 1 | TRANSDERMAL | @ 14:00:00

## 2023-12-26 MED ADMIN — enoxaparin (LOVENOX) syringe 40 mg: 40 mg | SUBCUTANEOUS | @ 01:00:00

## 2023-12-26 MED ADMIN — metroNIDAZOLE (FLAGYL) IVPB 500 mg: 500 mg | INTRAVENOUS | @ 17:00:00 | Stop: 2023-12-27

## 2023-12-26 MED ADMIN — oxyCODONE (ROXICODONE) 5 mg/5 mL solution 10 mg: 10 mg | ORAL | @ 09:00:00 | Stop: 2024-01-06

## 2023-12-26 MED ADMIN — levoFLOXacin (LEVAQUIN) 500 mg/100 mL IVPB 500 mg: 500 mg | INTRAVENOUS | @ 16:00:00 | Stop: 2023-12-27

## 2023-12-26 NOTE — Unmapped (Signed)
 Shift Summary  HYDROmorphone  and oxyCODONE  were administered PRN for abdominal pain, resulting in a decrease in pain score.   Negative pressure wound therapy was maintained with the dressing remaining clean, dry, and intact throughout the shift.   Safety interventions, including fall reduction measures and low bed positioning, were consistently implemented to reduce risk of injury.   Patient was observed sleeping during several assessments, with no documentation of distress or agitation.   Overall, pain management and wound care goals were supported, and no adverse events were documented during the shift.    Optimal Coping: Patient rested quietly and was observed sleeping during multiple assessments throughout the shift, with no documentation of distress or agitation.    Optimal Wound Healing: Abdominal wound dressing remained clean, dry, and intact with no odor and attached margins; negative pressure wound therapy was maintained at prescribed settings.    Absence of Hospital-Acquired Illness or Injury: Skin color was appropriate for ethnicity, friction and shear were not apparent, and safety interventions such as fall reduction and low bed positioning were consistently implemented throughout the shift.    Optimal Comfort and Wellbeing: Abdominal pain decreased from 8 to 7 after administration of HYDROmorphone  and oxyCODONE , and pain was described as aching rather than burning later in the shift.

## 2023-12-26 NOTE — Unmapped (Signed)
 Problem: Latex Allergy  Goal: Absence of Allergy Symptoms  Outcome: Shift Focus     Problem: Wound  Goal: Optimal Coping  Outcome: Shift Focus  Goal: Absence of Infection Signs and Symptoms  Outcome: Shift Focus  Goal: Improved Oral Intake  Outcome: Shift Focus     Problem: Adult Inpatient Plan of Care  Goal: Absence of Hospital-Acquired Illness or Injury  Outcome: Shift Focus  Intervention: Identify and Manage Fall Risk  Recent Flowsheet Documentation  Taken 12/26/2023 1800 by Pankaj Haack N, RN  Safety Interventions:   bed alarm   fall reduction program maintained   lighting adjusted for tasks/safety   low bed  Taken 12/26/2023 1600 by Zina Pitzer N, RN  Safety Interventions:   bed alarm   fall reduction program maintained   lighting adjusted for tasks/safety   low bed  Intervention: Prevent Skin Injury  Recent Flowsheet Documentation  Taken 12/26/2023 1800 by Samson Norman SAILOR, RN  Device Skin Pressure Protection:   absorbent pad utilized/changed   positioning supports utilized   tubing/devices free from skin contact  Skin Protection:   incontinence pads utilized   tubing/devices free from skin contact  Taken 12/26/2023 1600 by Samson, Tyrece Vanterpool SAILOR, RN  Device Skin Pressure Protection:   absorbent pad utilized/changed   positioning supports utilized   tubing/devices free from skin contact  Skin Protection:   incontinence pads utilized   tubing/devices free from skin contact   Shift Summary  levoFLOXacin  and metroNIDAZOLE  were both administered and then stopped during the shift.  Diphenhydramine  was given PRN for allergy symptoms, with no further documentation of allergic reaction.  Pressure reduction and safety interventions were maintained, including specialty bed, frequent weight shifts, and bed alarms.  Surgical wound sites and IV sites remained clean, dry, and intact, with no drainage or odor noted.  Overall, vital signs and skin integrity were stable and safety measures were consistently implemented throughout the shift.    Absence of Allergy Symptoms: Diphenhydramine  was administered PRN during the shift, and no documentation of allergy symptoms was noted after administration.    Optimal Coping: Psychosocial status remained within defined limits throughout the shift, with no changes documented.    Absence of Infection Signs and Symptoms: Temperature remained within normal range and surgical wound sites were documented as clean, dry, and intact, with no drainage or odor noted; levoFLOXacin  and metroNIDAZOLE  were administered and then stopped during the shift.    Absence of Hospital-Acquired Illness or Injury: Pressure reduction techniques and devices were consistently utilized, skin integrity was maintained, and safety interventions such as bed alarms and fall reduction programs were in place throughout the shift.

## 2023-12-26 NOTE — Unmapped (Signed)
VENOUS ACCESS TEAM PROCEDURE    Nurse request was placed for a PIV by Venous Access Team (VAT).  Patient was assessed at bedside for placement of a PIV. PPE were donned per protocol.  Access was obtained. Blood return noted.  Dressing intact and device well secured.  Flushed with normal saline.  See LDA for details.  Pt advised to inform RN of any s/s of discomfort at the PIV site.    Workup / Procedure Time:  30 minutes       Care RN was notified.       Thank you,     Gaige Sebo M Danial Sisley, RN Venous Access Team

## 2023-12-26 NOTE — Unmapped (Signed)
 [place] TRAUMA, ACUTE CARE, and GENERAL SURGERY   - Surgery Daily Progress Note -  12/26/2023       Admit Date: 12/22/2023, Hospital Day: 5  Hospital Service: [facility])  Attending: Tinnie Verla Dasie Quinn, MD  [facility] - [facility] General / Trauma Surgery - Trauma/CC     Assessment     Tracy Robles 58 y.o. female, PMH of  Psoriasis, Eczema, DM, HTN and  obesity  who presented to the ED on 12/22/2023 with acute onset epigastric pain and emesis. Imaging demonstrated SBO with transition point and she was admitted for operative intervention. S/p ex lap with 2x jejunal perforations repaired and adhesive band takedown.    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  - S/p ex-lap 9/10, found to have adhesive band from the uterus was causing transition point to small bowel; two areas of pinpoint perforations noted in the jejunum requiring repair   - Diet: Clear liquid diet s/p NGT removal on 9/12  - Fluids: LR @ 125 mL/hr  - Zofran  as needed for nausea   - Replete lytes prn  - Bowel regimen: on hold while AROBF  - IV protonix  for GI ppx stopped on 9/12  - Provena VAC down ~9/14    GU:   - Borderline UOP   - Cr returned to baseline  - foley pulled and voiding spontaneously    Lines:   Patient Lines/Drains/Airways Status       Active Peripheral & Central Intravenous Access       Name Placement date Placement time Site Days    Peripheral IV 12/22/23 Right Antecubital 12/22/23  1037  Antecubital  3                     Heme  - Hgb WNL     ID:   - Afebrile.  - WBC- No leukocytosis..   - Antibiotic Course:   Levaquin /Flagyl  started on 09/10 for total course of 4 days for intra-abdominal contamination - until 9/14    Endocrine: DM  - SSI  - Holding home Jardiance , Mounjaro      PPx:   lovenox  daily     Lines:   Patient Lines/Drains/Airways Status       Active Peripheral & Central Intravenous Access       Name Placement date Placement time Site Days    Peripheral IV 12/22/23 Right Antecubital 12/22/23  1037  Antecubital  3                       Dispo: Floor status  PT: not indicated  OT: not indicated  ST: not indicated  Barriers to discharge: awaiting return of bowel function  CM/SW assisting in discharge planning.     Artist Lex, MS4    I attest that I have reviewed the medical student note and that the components of the history of the present illness, the physical exam, and the assessment and plan documented were performed by me or were performed in my presence by the student where I verified the documentation  and performed (or re-performed) the exam and medical decision making.     Prentice Fishman, MD  PGY-1, General Surgery    Contact: [facility]-5 Pg. [phone]    Subjective     No overnight events. HDSORA. Patient reports pain is under good control.  Denies any bowel movements or flatulence. Voiding without foley. Tolerating CLD without N/V.    Objective     Vitals:   Temp:  [36.4 ??C (97.5 ??F)-36.8 ??C (98.2 ??F)] 36.4 ??C (97.5 ??F)  Pulse:  [103-110] 103  Resp:  [18-20] 18  BP: (138-149)/(74-91) 141/74  MAP (mmHg):  [91-109] 94  SpO2:  [92 %-98 %] 96 %    Intake/Output last 24 hours:  I/O last 3 completed shifts:  In: 1715 [P.O.:360; I.V.:1105; IV Piggyback:250]  Out: 800 [Urine:800]    Physical Exam:    -General:  Appropriate, comfortable and in no apparent distress.   -Neurological: Alert and oriented with proper conversation. Moves all 4 extremities spontaneously.   -Psych: speech appropriate, pleasant affect  -HEENT: Normocephalic and atraumatic.  NG tube removed  -Cardiovascular: Regular rate and rhythm, but tachycardic   -Pulmonary: Normal work of breathing. No accessory muscle use.  -Abdomen: Soft, non-distended, Appropriately tender to palpation post operative. Provena VAC in place.   -Genitourinary: Not assessed  -Extremities: Warm, well perfused, normal skin turgor.      Data Review:    I have reviewed the labs and studies from the last 24 hours.    Imaging: Radiology studies were personally reviewed

## 2023-12-27 LAB — BASIC METABOLIC PANEL
ANION GAP: 14 mmol/L (ref 5–14)
BLOOD UREA NITROGEN: 6 mg/dL — ABNORMAL LOW (ref 9–23)
BUN / CREAT RATIO: 8
CALCIUM: 8.6 mg/dL — ABNORMAL LOW (ref 8.7–10.4)
CHLORIDE: 101 mmol/L (ref 98–107)
CO2: 23 mmol/L (ref 20.0–31.0)
CREATININE: 0.73 mg/dL (ref 0.55–1.02)
EGFR CKD-EPI (2021) FEMALE: >90 mL/min/1.73m2 (ref >=60)
GLUCOSE RANDOM: 112 mg/dL (ref 70–179)
POTASSIUM: 3.6 mmol/L (ref 3.4–4.8)
SODIUM: 138 mmol/L (ref 135–145)

## 2023-12-27 LAB — CBC W/ AUTO DIFF
BASOPHILS ABSOLUTE COUNT: 0 10*9/L (ref 0.0–0.1)
BASOPHILS RELATIVE PERCENT: 0.2 %
EOSINOPHILS ABSOLUTE COUNT: 0.3 10*9/L (ref 0.0–0.5)
EOSINOPHILS RELATIVE PERCENT: 3.8 %
HEMATOCRIT: 27.2 % — ABNORMAL LOW (ref 34.0–44.0)
HEMOGLOBIN: 9.4 g/dL — ABNORMAL LOW (ref 11.3–14.9)
LYMPHOCYTES ABSOLUTE COUNT: 0.9 10*9/L — ABNORMAL LOW (ref 1.1–3.6)
LYMPHOCYTES RELATIVE PERCENT: 12.4 %
MEAN CORPUSCULAR HEMOGLOBIN CONC: 34.6 g/dL (ref 32.0–36.0)
MEAN CORPUSCULAR HEMOGLOBIN: 29.9 pg (ref 25.9–32.4)
MEAN CORPUSCULAR VOLUME: 86.6 fL (ref 77.6–95.7)
MEAN PLATELET VOLUME: 8.9 fL (ref 6.8–10.7)
MONOCYTES ABSOLUTE COUNT: 0.6 10*9/L (ref 0.3–0.8)
MONOCYTES RELATIVE PERCENT: 8.6 %
NEUTROPHILS ABSOLUTE COUNT: 5.3 10*9/L (ref 1.8–7.8)
NEUTROPHILS RELATIVE PERCENT: 75 %
PLATELET COUNT: 220 10*9/L (ref 150–450)
RED BLOOD CELL COUNT: 3.14 10*12/L — ABNORMAL LOW (ref 3.95–5.13)
RED CELL DISTRIBUTION WIDTH: 14.4 % (ref 12.2–15.2)
WBC ADJUSTED: 7.1 10*9/L (ref 3.6–11.2)

## 2023-12-27 LAB — MAGNESIUM: MAGNESIUM: 2 mg/dL (ref 1.6–2.6)

## 2023-12-27 LAB — PHOSPHORUS: PHOSPHORUS: 2.6 mg/dL (ref 2.4–5.1)

## 2023-12-27 MED ADMIN — hydrocortisone 1 % cream: TOPICAL

## 2023-12-27 MED ADMIN — gabapentin (NEURONTIN) oral solution: 300 mg | ORAL | @ 02:00:00

## 2023-12-27 MED ADMIN — topiramate (Topamax) tablet 25 mg: 25 mg | ORAL

## 2023-12-27 MED ADMIN — hydrocortisone 1 % cream: TOPICAL | @ 13:00:00

## 2023-12-27 MED ADMIN — lidocaine (ASPERCREME) 4 % 1 patch: 1 | TRANSDERMAL | @ 13:00:00

## 2023-12-27 MED ADMIN — melatonin tablet 3 mg: 3 mg | ORAL

## 2023-12-27 MED ADMIN — metroNIDAZOLE (FLAGYL) IVPB 500 mg: 500 mg | INTRAVENOUS | Stop: 2023-12-27

## 2023-12-27 MED ADMIN — potassium chloride ER tablet 40 mEq: 40 meq | ORAL | @ 16:00:00 | Stop: 2023-12-27

## 2023-12-27 MED ADMIN — oxyCODONE (ROXICODONE) 5 mg/5 mL solution 10 mg: 10 mg | ORAL | @ 09:00:00 | Stop: 2024-01-06

## 2023-12-27 MED ADMIN — lactated Ringers infusion: 125 mL/h | INTRAVENOUS | @ 07:00:00 | Stop: 2023-12-27

## 2023-12-27 MED ADMIN — gabapentin (NEURONTIN) oral solution: 300 mg | ORAL | @ 09:00:00

## 2023-12-27 MED ADMIN — gabapentin (NEURONTIN) oral solution: 300 mg | ORAL | @ 17:00:00

## 2023-12-27 MED ADMIN — metroNIDAZOLE (FLAGYL) IVPB 500 mg: 500 mg | INTRAVENOUS | @ 07:00:00 | Stop: 2023-12-27

## 2023-12-27 MED ADMIN — enoxaparin (LOVENOX) syringe 40 mg: 40 mg | SUBCUTANEOUS | @ 13:00:00

## 2023-12-27 MED ADMIN — enoxaparin (LOVENOX) syringe 40 mg: 40 mg | SUBCUTANEOUS

## 2023-12-27 NOTE — Unmapped (Signed)
 Patient remained A&O x4 throughout shift. Medicated as ordered. Bed low and locked, call light remained within reach. IV hydration in progress as ordered. Slept intermittently throughout shift, nil complaints voiced.

## 2023-12-27 NOTE — Unmapped (Signed)
 Pt is A&O x 4, VSS. Pt on reg diet and tolerating well. Pt got up to bedside commode and up to the chair with stand by assist. Pt PIV is c/d/I and flushes well. Midline incision is open to air with staples. All belongings w/in pt reach. Bed is low and locked. Purposeful hourly rounding.        Problem: Wound  Goal: Optimal Functional Ability  Outcome: Shift Focus     Problem: Skin Injury Risk Increased  Goal: Skin Health and Integrity  Outcome: Shift Focus  Intervention: Optimize Skin Protection  Recent Flowsheet Documentation  Taken 12/27/2023 1400 by Constantine Amabile, RN  Pressure Reduction Techniques: frequent weight shift encouraged  Taken 12/27/2023 1200 by Constantine Amabile, RN  Pressure Reduction Techniques: frequent weight shift encouraged  Pressure Reduction Devices: pressure-redistributing mattress utilized  Taken 12/27/2023 1000 by Constantine Amabile, RN  Pressure Reduction Techniques: frequent weight shift encouraged  Pressure Reduction Devices: pressure-redistributing mattress utilized  Taken 12/27/2023 0800 by Constantine Amabile, RN  Pressure Reduction Techniques: frequent weight shift encouraged  Pressure Reduction Devices: pressure-redistributing mattress utilized  Skin Protection: incontinence pads utilized     Problem: Adult Inpatient Plan of Care  Goal: Patient-Specific Goal (Individualized)  Outcome: Shift Focus     Problem: Self-Care Deficit  Goal: Improved Ability to Complete Activities of Daily Living  Outcome: Shift Focus

## 2023-12-27 NOTE — Unmapped (Signed)
 [place] TRAUMA, ACUTE CARE, and GENERAL SURGERY   - Surgery Daily Progress Note -  12/27/2023       Admit Date: 12/22/2023, Hospital Day: 6  Hospital Service: SurgTrauma [facility])  Attending: Tinnie Verla Dasie Quinn, MD  [facility] - SRH-5 General / Trauma Surgery - Trauma/CC     Assessment     Kalianna Verbeke Nilan 58 y.o. female, PMH of  Psoriasis, Eczema, DM, HTN and obesity  who presented to the ED on 12/22/2023 with acute onset epigastric pain and emesis. Imaging demonstrated SBO with transition point and she was admitted for operative intervention. S/p ex lap with 2x jejunal perforations repaired and adhesive band takedown.    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   - IV protonix  for GI ppx stopped on 9/12  - Provena VAC down 9/14    GU:   - Borderline UOP   - Cr returned to baseline  - foley pulled and voiding spontaneously    Lines:   Patient Lines/Drains/Airways Status       Active Peripheral & Central Intravenous Access       Name Placement date Placement time Site Days    Peripheral IV 12/26/23 Anterior;Left Forearm 12/26/23  1455  Forearm  less than 1                     Heme  - Hgb WNL     ID:   - Afebrile.  - WBC- No leukocytosis..   - Antibiotic Course:   Levaquin /Flagyl  started on 09/10 for total course of 4 days for intra-abdominal contamination - until 9/14    Endocrine: DM  - SSI  - Holding home Jardiance , Mounjaro      PPx:   lovenox  daily     Lines:   Patient Lines/Drains/Airways Status       Active Peripheral & Central Intravenous Access       Name Placement date Placement time Site Days    Peripheral IV 12/26/23 Anterior;Left Forearm 12/26/23  1455  Forearm  less than 1                   Dispo: Floor status  PT: not indicated  OT: not indicated  ST: not indicated  Barriers to discharge: awaiting return of bowel function  CM/SW assisting in discharge planning.     Lauraine Single, MS3     I attest that I have reviewed the medical student note and that the components of the history of the present illness, the physical exam, and the assessment and plan documented were performed by me or were performed in my presence by the student  where I verified the documentation and performed (or re-performed) the exam and medical decision making.     Prentice Fishman, MD  PGY-1, General Surgery    Contact: [facility] Pg. [phone]    Subjective     No overnight events. HDSORA. Patient reports pain is under good control. She had large amounts of flatulence the night of 9/13. Voiding without foley. Tolerating CLD without N/V.    Objective     Vitals:   Temp:  [36.7 ??C (98.1 ??F)-37 ??C (98.6 ??F)] 36.9 ??C (98.4 ??F)  Pulse:  [98-107] 98  Resp:  [18] 18  BP: (120-144)/(68-80) 144/80  MAP (mmHg):  [85-98] 98  SpO2:  [96 %-100 %] 100 %    Intake/Output last 24 hours:  I/O last 3 completed shifts:  In: -   Out: 700 [Urine:700]    Physical Exam:  -General:  Appropriate, comfortable and in no apparent distress.   -Neurological: Alert and oriented with proper conversation. Moves all 4 extremities spontaneously.   -Psych: speech appropriate, pleasant affect  -HEENT: Normocephalic and atraumatic.  NG tube removed  -Cardiovascular: Regular rate and rhythm, but tachycardic   -Pulmonary: Normal work of breathing. No accessory muscle use.  -Abdomen: Soft, non-distended, Appropriately tender to palpation post operative. Provena VAC in place but removed.   -Genitourinary: Not assessed  -Extremities: Warm, well perfused, normal skin turgor.    Data Review:    I have reviewed the labs and studies from the last 24 hours.    Imaging: Radiology studies were personally reviewed

## 2023-12-27 NOTE — Unmapped (Shared)
 [place] TRAUMA, ACUTE CARE, and GENERAL SURGERY   - Surgery Daily Progress Note -  12/27/2023       Admit Date: 12/22/2023, Hospital Day: 6  Hospital Service: [facility])  Attending: Tinnie Verla Dasie Quinn, MD  [facility] - [facility] General / Trauma Surgery - Trauma/CC     Assessment     Tracy Robles 58 y.o. female, PMH of  Psoriasis, Eczema, DM, HTN and obesity  who presented to the ED on 12/22/2023 with acute onset epigastric pain and emesis. Imaging demonstrated SBO with transition point and she was admitted for operative intervention. S/p ex lap with 2x jejunal perforations repaired and adhesive band takedown.    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   - IV protonix  for GI ppx stopped on 9/12  - Provena VAC down 9/14    GU:   - Borderline UOP   - Cr returned to baseline  - foley pulled and voiding spontaneously    Lines:   Patient Lines/Drains/Airways Status       Active Peripheral & Central Intravenous Access       Name Placement date Placement time Site Days    Peripheral IV 12/26/23 Anterior;Left Forearm 12/26/23  1455  Forearm  less than 1                     Heme  - Hgb WNL     ID:   - Afebrile.  - WBC- No leukocytosis..   - Antibiotic Course:   Levaquin /Flagyl  started on 09/10 for total course of 4 days for intra-abdominal contamination - until 9/14    Endocrine: DM  - SSI  - Holding home Jardiance , Mounjaro      PPx:   lovenox  daily     Lines:   Patient Lines/Drains/Airways Status       Active Peripheral & Central Intravenous Access       Name Placement date Placement time Site Days    Peripheral IV 12/26/23 Anterior;Left Forearm 12/26/23  1455  Forearm  less than 1                   Dispo: Floor status  PT: not indicated  OT: not indicated  ST: not indicated  Barriers to discharge: awaiting return of bowel function  CM/SW assisting in discharge planning.     Lauraine Single, MS3     I attest that I have reviewed the medical student note and that the components of the history of the present illness, the physical exam, and the assessment and plan documented were performed by me or were performed in my presence by the student where I verified  the documentation and performed (or re-performed) the exam and medical decision making.     Prentice Fishman, MD  PGY-1, General Surgery    Contact: [facility] Pg. [phone]    Subjective     No overnight events. HDSORA. Patient reports pain is under good control. She had large amounts of flatulence the night of 9/13. Voiding without foley. Tolerating CLD without N/V.    Objective     Vitals:   Temp:  [36.7 ??C (98.1 ??F)-37 ??C (98.6 ??F)] 36.9 ??C (98.4 ??F)  Pulse:  [98-107] 98  Resp:  [18] 18  BP: (120-144)/(68-80) 144/80  MAP (mmHg):  [85-98] 98  SpO2:  [96 %-100 %] 100 %    Intake/Output last 24 hours:  I/O last 3 completed shifts:  In: -   Out: 700 [Urine:700]    Physical Exam:  -General:  Appropriate, comfortable and in no apparent distress.   -Neurological: Alert and oriented with proper conversation. Moves all 4 extremities spontaneously.   -Psych: speech appropriate, pleasant affect  -HEENT: Normocephalic and atraumatic.  NG tube removed  -Cardiovascular: Regular rate and rhythm, but tachycardic   -Pulmonary: Normal work of breathing. No accessory muscle use.  -Abdomen: Soft, non-distended, Appropriately tender to palpation post operative. Provena VAC in place but removed.   -Genitourinary: Not assessed  -Extremities: Warm, well perfused, normal skin turgor.    Data Review:    I have reviewed the labs and studies from the last 24 hours.    Imaging: Radiology studies were personally reviewed

## 2023-12-28 MED ORDER — GABAPENTIN 250 MG/5 ML ORAL SOLUTION
Freq: Three times a day (TID) | ORAL | 0 refills | 14.00000 days | Status: CP
Start: 2023-12-28 — End: 2024-01-11
  Filled 2023-12-28: qty 252, 14d supply, fill #0

## 2023-12-28 MED ORDER — LIDOCAINE 4 % TOPICAL PATCH
MEDICATED_PATCH | Freq: Every day | TRANSDERMAL | 0 refills | 10.00000 days | Status: CP
Start: 2023-12-28 — End: 2024-01-07
  Filled 2023-12-28: qty 10, 10d supply, fill #0

## 2023-12-28 MED ORDER — OXYCODONE 5 MG TABLET
ORAL_TABLET | ORAL | 0 refills | 2.00000 days | Status: CP | PRN
Start: 2023-12-28 — End: 2024-01-02
  Filled 2023-12-28: qty 10, 2d supply, fill #0

## 2023-12-28 MED ORDER — TOPIRAMATE 25 MG TABLET
ORAL_TABLET | Freq: Every evening | ORAL | 2 refills | 30.00000 days | Status: CP
Start: 2023-12-28 — End: 2024-03-27
  Filled 2023-12-28: qty 30, 30d supply, fill #0

## 2023-12-28 MED ORDER — POLYETHYLENE GLYCOL 3350 17 GRAM ORAL POWDER PACKET
PACK | Freq: Every day | ORAL | 0 refills | 14.00000 days | Status: CP
Start: 2023-12-28 — End: 2024-01-11
  Filled 2023-12-28: qty 14, 14d supply, fill #0

## 2023-12-28 MED ADMIN — gabapentin (NEURONTIN) oral solution: 300 mg | ORAL | @ 10:00:00 | Stop: 2023-12-28

## 2023-12-28 MED ADMIN — enoxaparin (LOVENOX) syringe 40 mg: 40 mg | SUBCUTANEOUS

## 2023-12-28 MED ADMIN — diphenhydrAMINE (BENADRYL) capsule/tablet 25 mg: 25 mg | ORAL | @ 02:00:00

## 2023-12-28 MED ADMIN — gabapentin (NEURONTIN) oral solution: 300 mg | ORAL | @ 02:00:00

## 2023-12-28 MED ADMIN — hydrocortisone 1 % cream: TOPICAL | @ 13:00:00 | Stop: 2023-12-28

## 2023-12-28 MED ADMIN — oxyCODONE (ROXICODONE) 5 mg/5 mL solution 10 mg: 10 mg | ORAL | @ 02:00:00 | Stop: 2024-01-06

## 2023-12-28 MED ADMIN — polyethylene glycol (MIRALAX) packet 17 g: 17 g | ORAL | @ 13:00:00 | Stop: 2023-12-28

## 2023-12-28 MED ADMIN — lidocaine (ASPERCREME) 4 % 1 patch: 1 | TRANSDERMAL | @ 13:00:00 | Stop: 2023-12-28

## 2023-12-28 MED ADMIN — hydrocortisone 1 % cream: TOPICAL

## 2023-12-28 MED ADMIN — gabapentin (NEURONTIN) oral solution: 300 mg | ORAL | @ 17:00:00 | Stop: 2023-12-28

## 2023-12-28 MED ADMIN — oxyCODONE (ROXICODONE) 5 mg/5 mL solution 10 mg: 10 mg | ORAL | @ 09:00:00 | Stop: 2023-12-28

## 2023-12-28 MED ADMIN — diphenhydrAMINE (BENADRYL) capsule/tablet 25 mg: 25 mg | ORAL | @ 09:00:00 | Stop: 2023-12-28

## 2023-12-28 MED ADMIN — melatonin tablet 3 mg: 3 mg | ORAL

## 2023-12-28 MED ADMIN — topiramate (Topamax) tablet 25 mg: 25 mg | ORAL

## 2023-12-28 MED ADMIN — enoxaparin (LOVENOX) syringe 40 mg: 40 mg | SUBCUTANEOUS | @ 13:00:00 | Stop: 2023-12-28

## 2023-12-28 NOTE — Unmapped (Signed)
 1. The beneficiary has a mobility limitation that significantly impairs his/her ability to participate in one or more mobility-related activities of daily living (MRADL) in the home.    A mobility limitation is one that:    a. Prevents the beneficiary from accomplishing the MRADL entirely, or  b. Places the beneficiary at reasonably determined heightened risk of morbidity or mortality secondary to the attempts to perform the MRADL, or  c. Prevents the beneficiary from completing the MRADL within a reasonable time frame; and  2. The beneficiary is able to safely use the walker; and  3. The functional mobility deficit can be sufficiently resolved with use of a walker.

## 2023-12-28 NOTE — Unmapped (Signed)
 Pt is A&O x 4 VVS, educated pt on follow up appointment, incision care, when to call the provider, activity restrictions, and new prescriptions. Per teach back pt has full understanding. Printed AVS given to pt. Removed pt IV. All belongings sent home with pt. Pt discharged home in private vehicle. Transported to lobby in wheel chair.        Problem: Latex Allergy  Goal: Absence of Allergy Symptoms  Outcome: Discharged to Home     Problem: Wound  Goal: Optimal Coping  Outcome: Discharged to Home  Goal: Optimal Functional Ability  Outcome: Discharged to Home  Goal: Absence of Infection Signs and Symptoms  Outcome: Discharged to Home  Goal: Improved Oral Intake  Outcome: Discharged to Home  Goal: Optimal Pain Control and Function  Outcome: Discharged to Home  Goal: Skin Health and Integrity  Outcome: Discharged to Home  Intervention: Optimize Skin Protection  Recent Flowsheet Documentation  Taken 12/28/2023 1400 by Constantine Amabile, RN  Pressure Reduction Techniques: frequent weight shift encouraged  Pressure Reduction Devices: pressure-redistributing mattress utilized  Taken 12/28/2023 1000 by Constantine Amabile, RN  Pressure Reduction Techniques: frequent weight shift encouraged  Pressure Reduction Devices: pressure-redistributing mattress utilized  Taken 12/28/2023 0800 by Constantine Amabile, RN  Pressure Reduction Techniques: frequent weight shift encouraged  Pressure Reduction Devices: pressure-redistributing mattress utilized  Goal: Optimal Wound Healing  Outcome: Discharged to Home     Problem: Skin Injury Risk Increased  Goal: Skin Health and Integrity  Outcome: Discharged to Home  Intervention: Optimize Skin Protection  Recent Flowsheet Documentation  Taken 12/28/2023 1400 by Constantine Amabile, RN  Pressure Reduction Techniques: frequent weight shift encouraged  Pressure Reduction Devices: pressure-redistributing mattress utilized  Taken 12/28/2023 1000 by Constantine Amabile, RN  Pressure Reduction Techniques: frequent weight shift encouraged  Pressure Reduction Devices: pressure-redistributing mattress utilized  Taken 12/28/2023 0800 by Constantine Amabile, RN  Pressure Reduction Techniques: frequent weight shift encouraged  Pressure Reduction Devices: pressure-redistributing mattress utilized     Problem: Adult Inpatient Plan of Care  Goal: Plan of Care Review  Outcome: Discharged to Home  Goal: Patient-Specific Goal (Individualized)  Outcome: Discharged to Home  Goal: Absence of Hospital-Acquired Illness or Injury  Outcome: Discharged to Home  Intervention: Identify and Manage Fall Risk  Recent Flowsheet Documentation  Taken 12/28/2023 1400 by Constantine Amabile, RN  Safety Interventions:   low bed   lighting adjusted for tasks/safety   fall reduction program maintained  Intervention: Prevent Skin Injury  Recent Flowsheet Documentation  Taken 12/28/2023 1400 by Constantine Amabile, RN  Positioning for Skin: Supine/Back  Taken 12/28/2023 1200 by Constantine Amabile, RN  Positioning for Skin: Supine/Back  Taken 12/28/2023 1000 by Constantine Amabile, RN  Positioning for Skin: Supine/Back  Taken 12/28/2023 0800 by Constantine Amabile, RN  Positioning for Skin: Supine/Back  Intervention: Prevent and Manage VTE (Venous Thromboembolism) Risk  Recent Flowsheet Documentation  Taken 12/28/2023 1400 by Constantine Amabile, RN  Anti-Embolism Device Status: Refused  Taken 12/28/2023 1200 by Constantine Amabile, RN  Anti-Embolism Device Status: On  Taken 12/28/2023 1000 by Constantine Amabile, RN  Anti-Embolism Device Status: On  Taken 12/28/2023 0800 by Constantine Amabile, RN  Anti-Embolism Device Status: Off  Goal: Optimal Comfort and Wellbeing  Outcome: Discharged to Home  Goal: Readiness for Transition of Care  Outcome: Discharged to Home  Goal: Rounds/Family Conference  Outcome: Discharged to Home     Problem: Fall Injury Risk  Goal: Absence of Fall and Fall-Related Injury  Outcome: Discharged to Home  Intervention: Promote Injury-Free  Environment  Recent Flowsheet Documentation  Taken 12/28/2023 1400 by Constantine Amabile, RN  Safety Interventions:   low bed   lighting adjusted for tasks/safety   fall reduction program maintained     Problem: Self-Care Deficit  Goal: Improved Ability to Complete Activities of Daily Living  Outcome: Discharged to Home

## 2023-12-28 NOTE — Unmapped (Incomplete)
----------        Thank you for coming to [facility] today!     We aim to provide you with the highest quality, individualized care.  If you have any unanswered questions after the visit, please do not hesitate to reach out to [place] on MyChart or leave a message for the nurse.  ?  MyChart messages: Messages sent to your provider will be checked by their clinical support staff throughout the day during normal business hours (8:30 am-4:00 pm, Monday-Friday). Please note that responses may take up to 48 hours.  Please use this method of communication for non-urgent and non-emergent concerns, questions, refill requests or inquiries only.?Our team will help respond to all of your questions. Please note that you may be asked to see a provider either in person or by telehealth if addressing your questions would be best handled in that way.  Please keep in mind that these messages are not real time communications, so be patient when waiting for a response.    If you do not have access to MyChart, do not know how to use MyChart or have an issue that may require more extensive discussion, please call the nurses' call line: [phone].?This line is checked throughout the day and will be responded to as time allows.?Please note that return calls may take up to 48 hours, depending on the nature of the need.?  ?  If you have an issue that requires emergent attention that cannot wait, either call the Orthopaedics resident on call at [phone], consider coming to our [facility] walk-in clinic, or go to the nearest Emergency Department.    If you need to schedule future appointments, please call [phone].     We look forward to seeing you again in the future and appreciate you choosing [facility] for your care!    Thank you,    Sabino Dick, MD            We provide innovative and comprehensive patient centered care that is supported by evidence-based research RESEARCH PARTICIPATION    Please check out our current research studies to see if you or someone you know may qualify at:    [email or web address]

## 2023-12-28 NOTE — Unmapped (Signed)
 Discharge Summary    Admit date: 12/22/2023    Discharge date and time: 12/28/2023    Discharge to:  Home    Discharge Service: [facility]    Discharge Attending Physician: Tinnie Verla Dasie Quinn, MD    Discharge  Diagnoses: s/p exploratory laparotomy, exploratory celiotomy, lysis of adhesions    Secondary Diagnosis: Active Problems:    * No active hospital problems. *  Resolved Problems:    * No resolved hospital problems. *      OR Procedures:    DIAGNOSTIC AND OR OPERATIVE LAPAROSCOPY  LAPAROSCOPY, SURGICAL, ENTEROLYSIS (FREEING OF INTESTINAL ADHESION) (SEPARATE PROCEDURE)  ENTEROTOMY, SM INTESTINE, OTHER THAN DUODENUM; FOR EXPL, BX, OR FOREIGN BODY REMOVAL  EXPLORATORY LAPAROTOMY, EXPLORATORY CELIOTOMY WITH OR WITHOUT BIOPSY(S)  Date  12/22/2023  -------------------     Ancillary Procedures: no procedures    Discharge Day Services:   The patient was seen and examined by the Trauma Surgery team on the day of discharge. Vital signs and laboratory values were stable and within normal limits. Surgical wounds were examined. Discharge plan was discussed, instructions for home care given, and all questions answered.    Subjective   No acute events overnight. Pain Controlled. No fever or chills.    Objective   No data found.  No intake/output data recorded.    General Appearance:   No acute distress  Lungs:                Clear to auscultation bilaterally  Heart:                           Regular rate and rhythm  Abdomen:                Soft, non-tender, non-distended  Extremities:              Warm and well perfused    Hospital Course:  Tracy Robles 58 y.o. female PMHx of psoriasis, eczema, DM, HTN, and obesity who presented to the ED on 9/9 with acute onset epigastric pain and emesis. CTA abdomen demonstrating small bowel obstruction with transition point in the right lower quadrant.       #Small bowel obstruction:   The patient was taken to the OR on 12/23/2023 for diagnostic laparoscopy with laparoscopic lysis of adhesions converted to open laparotomy with repair of small bowel perforations x2. Operative findings were adhesive band from the uterus adhered to the small bowel causing small bowel obstruction with resultant upstream dilation of bowel; two small bowel perforations noted in the jejunum requiring repair; no bowel resection needed.  She tolerated the procedure well, was extubated in the OR, and received routine post-operative care before being transferred to the floor. Her diet was advanced and by discharge she was tolerating a regular diet. She was voiding adequately, ambulating independently, and her pain was controlled wth PO pain medications. She was examined by the Trauma Surgery team on the day of discharge and was deemed suitable for discharge home. She will be discharged home on POD #6 in good condition.    I spent greater than 30 minutes completing this discharge.       Condition at Discharge: Improved  Discharge Medications:      Medication List      PAUSE taking these medications     blood-glucose meter kit; Wait to take this until your doctor or other   care provider tells you to start again.;  Use as instructed   losartan  100 MG tablet; Wait to take this until your doctor or other   care provider tells you to start again.; Commonly known as: COZAAR ; Take 1   tablet (100 mg total) by mouth daily.   spironolactone  25 MG tablet; Wait to take this until your doctor or   other care provider tells you to start again.; Commonly known as:   ALDACTONE ; Take 1 tablet (25 mg total) by mouth daily.     START taking these medications     gabapentin  300 mg/6 mL (6 mL) Soln oral solution; Commonly known as:   NEURONTIN ; Take 6 mL (300 mg total) by mouth every eight (8) hours for 14   days.   lidocaine  4 % patch; Commonly known as: ASPERCREME; Place 1 patch on the   skin daily for 7 days.   oxyCODONE  5 MG immediate release tablet; Commonly known as: ROXICODONE ;   Take 1 tablet (5 mg total) by mouth every four (4) hours as needed for   pain for up to 5 days.   polyethylene glycol 17 gram packet; Commonly known as: MIRALAX ; Take 17   g by mouth daily for 14 days.     CHANGE how you take these medications     topiramate  25 MG tablet; Commonly known as: TOPAMAX ; Take 1 tablet (25   mg total) by mouth nightly.; What changed: how much to take     CONTINUE taking these medications     blood-glucose sensor Devi; Use as directed   cetirizine  5 MG tablet; Commonly known as: ZYRTEC    clobetasol  0.05 % external solution; Commonly known as: TEMOVATE ; Apply   topically to scalp twice daily.   COSENTYX  PEN (2 PENS) 150 mg/mL Pnij injection; Generic drug:   secukinumab ; Inject the contents of 2 pens (300 mg total) under the skin   every twenty-eight (28) days.   empagliflozin  10 mg tablet; Commonly known as: JARDIANCE ; Take 1 tablet   (10 mg total) by mouth daily.   glucose blood test strip; Generic drug: blood sugar diagnostic; Check   blood sugar as directed once a day and for symptoms of high or low blood   sugar.   loratadine 10 mg tablet; Commonly known as: CLARITIN   meloxicam  15 MG tablet; Commonly known as: MOBIC ; TAKE 1 TABLET BY MOUTH   ONCE DAILY AS NEEDED FOR PAIN   MOUNJARO  12.5 mg/0.5 mL Pnij; Generic drug: tirzepatide ; Inject 12.5 mg   under the skin every seven (7) days.   MULTIVITAMIN GUMMIES 200 mcg Chew; Generic drug: multivit with min-folic   acid   ONETOUCH DELICA PLUS LANCET 33 gauge Misc; Generic drug: lancets   simvastatin  20 MG tablet; Commonly known as: ZOCOR ; Take 1 tablet (20 mg   total) by mouth nightly.   triamcinolone  0.1 % cream; Commonly known as: KENALOG ; Apply topically   to active, raised areas twice daily as needed.       Pending Test Results: none    Discharge Instructions:  Activity:     Diet:    Other Instructions:  Other Instructions       Discharge instructions      Instructions    DIET: Regular     ACTIVITY: You may shower in 48 hours (2 days) after the procedure, but please avoid baths, hot tubs, pools, soaking or sitting in water for 2 weeks.  Please allow the soap and water to run over your incision.  Do not scrub your  incision.       No lifting greater than 10 pounds or strenuous activity for 2 weeks then nothing over 20 pounds from 2-4 weeks. If feeling pulling at incision back off.  Continue restrictions until 02/02/2024.    INCISION: Look at your incision at least twice a day. A small amount of drainage is normal, especially in the first couple of days after surgery. If you notice any redness around your incisions that spreads along your skin, or any thick, yellow drainage, you should call the clinic. The surgical glue that was placed over your incisions will dissolve naturally over time. Do not put any ointments or cream over the glue, and do not pull the glue off or pick at it. Avoid wearing tight clothing. Will need a post op appointment for staple removal and follow up. This was requested for you at the time of discharge.     PAIN MEDICATION: Do not drive or drink alcohol while taking prescription pain medication. Take your prescribed pain medication only as needed. You may decrease the amount of pain medication as tolerated, and take Tylenol  or a Motrin product as directed on the label if have tolerated these medications before and have not been told by another provider not to take them.  Pain medication can cause constipation. To avoid this, you should take an over-the-counter stool softener, such as docusate 100mg  1-2 times daily.     Effective October 11, 2015, the [place] signed a [place]  law that works to address the opioid crisis our state is facing.  It is called the STOP Law, for Strengthen Opioid Misuse Prevention.       As part of this law, we are limited to prescribing no more than a seven-day supply of opioid pain medicine for our surgery patients initally.  (This law also limits prescriptions for patients who have not had surgery to a five-day supply.)     If you are in need of additional pain control, the best number to call is [phone] before your current medications run out.  For after hours and weekends, please call [phone] and ask to speak with the surgery resident on call. The on-call provider may NOT be able to renew your prescription.     The STOP Act also monitors the amount of controlled substances that each patient has received from any and all providers.  Prescriptions for each patient will be monitored by the [place] Controlled Substances Reporting System in accordance with the law.  Thank you for working with us  so we can provide good pain control in the safest way possible while you recover.     Please note that prescription pain medication refills will not be called into your pharmacy. If you feel that you are needing more pain medication, you will need to be seen in clinic or the Emergency Department for further evaluation to ensure you are not experiencing a complication. Please take note of how many pills you have left in your bottle, and if you are starting to run low and feel that you will need more for adequate pain control, to call the clinic as soon as possible to schedule an appointment, as our clinics are very busy, and we cannot guarantee same week appointments.    The Trauma Surgery office number is [phone]. For emergencies at night or on the week-end, please call [phone]) and ask for the surgery resident on call.    WHEN TO CALL THE SURGEON'S OFFICE:    1. Thick,  yellow drainage from your incisions, redness at incisions, bleeding, or separations of wounds.  2. Temperature greater than 101.5  3. Uncontrolled nausea or vomiting.  4. Pain that is not controlled by your pain medication or feels like a gallbladder attack.  5. Jaundice (yellow eyes or skin)    We care deeply about how our patients' pain is managed after surgery.  During the month after you have surgery, you may receive a call from us  asking about your postsurgical pain and about how you managed that pain.  The survey will take approximately 5 to 10 minutes and will help us  to provide better care to our patients.  Thank you in advance for your participation and please answer any calls coming from the [facility] or [place] area!    Please follow-up with your primary care provider (PCP) to discuss the following incidental findings noted on your imaging:  - Patulous thoracic esophagus  - Grade 1 anterolisthesis of L4 on L5     Follow Up:     Important Information and Numbers:      [facility], Division of General and Acute Care Surgery  [address]  [facility] - First Floor  [place], [place]  [address]  During regular business hours: (Monday-Friday, 8am-4:30 pm) call,     -Contact Information for Nurses:  [phone]: Wyvonna sherlynn Register  [phone]: Ike   [phone]: Office (if you need to change your OR Date)  Fax: [phone]    Clinic Number: To schedule, reschedule or cancel an appointment:  [phone]     During Nights and Weekends will need to reach Resident on Call: [phone]    If you have any FMLA or other paperwork that needs filled out related to work, school, etc., that hasn't been filled out during your hospitalization, please fax to the number listed above [phone]) instead of bringing to your clinic appointment and this will help decrease any delays in paperwork completion.    Will schedule an appointment in 1-3 weeks If having concerns please call to schedule an appointment.     Please note, if you arrive more than 20 minutes late, you will need to reschedule your appointment    A follow up appointment was requested for you on 12/28/2023.          Labs and Other Follow-ups after Discharge:  Follow Up instructions and Outpatient Referrals     Discharge instructions        Resources and Referrals       Walker      Type: Rolling    Wheels: 5 Fixed    Length of Need: lifetime    Height:   157.5 (5' 2.01)    Weight:  126 kg ((277 lb 12.8 oz)        Height:   157.5 (5' 2.01)    Weight:  126 kg ((277 lb 12.8 oz)          Future Appointments:  Appointments which have been scheduled for you      Dec 28, 2023 3:10 PM  (Arrive by 2:55 PM)  Procedure with Fonda Belle Hood, MD  [facility] SPORTS MED CENTER [place] [facility] REGION) [address]  [place] [address]  [phone]   Leave all valuables at home.         Jan 11, 2024 3:00 PM  (Arrive by 2:30 PM)  POST OP with GEN AND ACUTE  APP  [facility] GENERAL AND ACUTE CARE SURGERY [place] [facility] REGION) [address]  [place] [address]  [phone]        Jan 14, 2024 9:00 AM  (Lucrezia by 8:45 AM)  OFFICE VISIT with SPCAUD AUDIOLOGIST CC  [facility] AUDIOLOGY [place] [address] [place] [facility] REGION) [address]  [address]  [place] [address]  [phone]        Jan 14, 2024 9:30 AM  NEW ENT with Donnice Dannielle Coleridge, MD  [facility] OTOLARYNGOLOGY [address] [place] [facility] REGION) [phone] [address]  [place] [address]  [phone]        Jan 19, 2024 3:45 PM  (Arrive by 3:30 PM)  RETURN SPINE with Garnette Ozell Hamilton, NP  [facility] SPINE CTR NEUROSURG [facility] [address] [place] [facility] REGION) [address]  2nd Floor  [place] [address]  [phone]        Mar 29, 2024 3:00 PM  (Arrive by 2:45 PM)  RETURN CONTINUITY with Theodoro Just, MD  [facility] FAMILY MEDICINE [address] [place] [address] [facility] [place]) [address] [place] [address]  [address]  [place] [address]  [phone]

## 2023-12-28 NOTE — Unmapped (Signed)
 Shift Summary  oxyCODONE  was administered PRN for abdominal pain during the shift.  diphenhydrAMINE  was given PRN, and lidocaine  was stopped earlier in the evening.  Pressure reduction and skin protection interventions were consistently maintained, including absorbent pad changes and use of pressure-redistributing mattress.  Fall prevention strategies were upheld, with non-skid footwear, low bed, and hourly visual checks documented throughout the shift.  Overall, vital signs and wound assessments remained stable, and no new complications were documented during the shift.    Absence of Allergy Symptoms: diphenhydrAMINE  was administered PRN during the shift, and no new allergy symptoms were documented.    Optimal Coping: Psychosocial status remained within defined limits throughout the shift, with no changes noted in coping documentation.    Absence of Infection Signs and Symptoms: Temperature remained stable and within normal range, and surgical wound assessments for the abdomen and umbilicus indicated intact closures and clean dressings; no new symptoms were documented.    Skin Health and Integrity: Skin color was appropriate for ethnicity, absorbent and incontinence pads were regularly utilized and changed, and pressure reduction techniques and devices were consistently maintained; surgical wound and IV site dressings remained clean, dry, and intact throughout the shift.    Absence of Fall and Fall-Related Injury: Fall reduction interventions, including low bed positioning, non-skid footwear, and hourly visual checks, were maintained throughout the shift with no documented falls or injuries.

## 2023-12-28 NOTE — Unmapped (Signed)
  TRAUMA, ACUTE CARE, and GENERAL SURGERY   - Surgery Daily Progress Note -  12/28/2023       Admit Date: 12/22/2023, Hospital Day: 7  Hospital Service: [facility])  Attending: Tinnie Verla Dasie Quinn, MD  [facility] - [facility] General / Trauma Surgery - Trauma/CC     Assessment     Tracy Robles 58 y.o. female, PMH of  Psoriasis, Eczema, DM, HTN and obesity  who presented to the ED on 12/22/2023 with acute onset epigastric pain and emesis. Imaging demonstrated SBO with transition point and she was admitted for operative intervention. S/p ex lap with 2x jejunal perforations repaired and adhesive band takedown.    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  - S/p ex-lap 9/10, found to have adhesive band from the uterus was causing transition point to small bowel; two areas of pinpoint perforations noted in the jejunum requiring repair   - Diet: regular diet 9/14, ensures  - Fluids: ML   - Zofran  as needed for nausea   - Replete lytes prn - no labs ordered today   - Bowel regimen: Miralax  added   - IV protonix  for GI ppx stopped on 9/12  - Provena VAC down 9/14    GU:   - Borderline UOP , voids   - Cr stable     Lines:   Patient Lines/Drains/Airways Status       Active Peripheral & Central Intravenous Access       Name Placement date Placement time Site Days    Peripheral IV 12/26/23 Anterior;Left Forearm 12/26/23  1455  Forearm  1                     Heme  - Hgb WNL     ID:   - Afebrile.  - WBC- No leukocytosis..   - Antibiotic Course:   Levaquin /Flagyl  started on 09/10 for total course of 4 days for intra-abdominal contamination - until 9/14    Endocrine: DM  - SSI  - Holding home Jardiance , Mounjaro      PPx: lovenox  BID     Lines:   Patient Lines/Drains/Airways Status       Active Peripheral & Central Intravenous Access       Name Placement date Placement time Site Days    Peripheral IV 12/26/23 Anterior;Left Forearm 12/26/23  1455  Forearm  1                   Dispo: Floor status  PT: not indicated  OT: not indicated  ST: not indicated  Barriers to discharge: PT/OT re-evaluation for possible walker, dispo  CM/SW assisting in discharge planning.     Dedra HILARIO Grippe, AGACNP   Contact: [facility] Pg. [phone]    Subjective     No overnight events.     Objective     Vitals:   Temp:  [37 ??C (98.6 ??F)-37.4 ??C (99.3 ??F)] 37 ??C (98.6 ??F)  Pulse:  [94-104] 94  Resp:  [18] 18  BP: (132-140)/(71-77) 132/77  MAP (mmHg):  [  90-94] 94  SpO2:  [98 %-99 %] 98 %    Intake/Output last 24 hours:  I/O last 3 completed shifts:  In: 200 [P.O.:200]  Out: 300 [Urine:300]    Physical Exam:  -General:  Appropriate, comfortable and in no apparent distress.   -Neurological: Alert and oriented with proper conversation. Moves all 4 extremities spontaneously.   -Psych: speech appropriate, pleasant affect  -HEENT: Normocephalic and atraumatic.    -Cardiovascular: Regular rate and rhythm  -Pulmonary: Normal work of breathing. No accessory muscle use. RA   -Abdomen: Soft, non-distended, Appropriately tender to palpation post operative. Dressing is C/D/I, staples intact    -Genitourinary: Deferred, voids   -Extremities: Warm, well perfused, normal skin turgor.    Data Review:    I have reviewed the labs and studies from the last 24 hours.    Imaging: None

## 2023-12-29 LAB — TOPIRAMATE LEVEL: TOPIRAMATE: <1 ug/mL

## 2024-01-02 NOTE — Unmapped (Signed)
 Patient called in with question regarding drainage from midline incision noted on dressing. No active drainage at the time of the call. Drainage sounds to be serosanguinous. No purulence or odor.     Did have a brief episode of night sweats though feels normal this AM. Denies pain, measured fever, redness at the incision site. No nausea, vomiting.     Basic dressing and incision care instructions provided. Red flag symptoms that should prompt presentation including purulent drainage, fever, chills, incisional redness, or large volume drainage were discussed with the patient.     Raphaela Cannaday E Tazia Illescas, MD

## 2024-01-06 NOTE — Unmapped (Unsigned)
 Tracy Robles is seen in consultation at the request of Dr. Freddrick for the evaluation of ***    Dear Dr. Freddrick,    Thank you for referring Tracy Robles; as you know she is a 58 y.o. female who presents for the evaluation of ***.    Past Medical History  She  has a past medical history of Allergic (Don't know), Arthritis (2011), BMI 60.0-69.9, adult (CMS-HCC) (05/29/2021), Diabetes mellitus    (CMS-HCC), Eczema, High blood pressure, and Psoriasis.    Past Surgical History  Her  has a past surgical history that includes pr lap,diagnostic abdomen (N/A, 12/22/2023); pr lap, surg enterolysis (N/A, 12/22/2023); pr enterotomy,non-duod,explor/bx/fb removal (N/A, 12/22/2023); and pr exploratory of abdomen (N/A, 12/22/2023).    Past Family History  Her family history includes Arthritis in her mother; Asthma in her father; Breast cancer in her maternal aunt; Clotting disorder in her father; Diabetes in her father and sister; Hypertension in her father and mother; Kidney disease in her father; Miscarriages / Stillbirths in her maternal grandmother and sister; Stroke in her maternal grandmother.    Past Social History  She  reports that she has never smoked. She has been exposed to tobacco smoke. She has never used smokeless tobacco. She reports current alcohol use of about 1.0 standard drink of alcohol per week. She reports that she does not use drugs.    Medications/Allergies  Her current medication(s) include:    Current Outpatient Medications:     blood sugar diagnostic (GLUCOSE BLOOD) Strp, Check blood sugar as directed once a day and for symptoms of high or low blood sugar., Disp: 100 strip, Rfl: 3    [Paused] blood-glucose meter kit, Use as instructed, Disp: 1 each, Rfl: 0    blood-glucose sensor Devi, Use as directed, Disp: 5 each, Rfl: 6    cetirizine  (ZYRTEC ) 5 MG tablet, Take 1 tablet (5 mg total) by mouth once as needed., Disp: , Rfl:     clobetasoL  (TEMOVATE ) 0.05 % external solution, Apply topically to scalp twice daily., Disp: 50 mL, Rfl: 6    empagliflozin  (JARDIANCE ) 10 mg tablet, Take 1 tablet (10 mg total) by mouth daily., Disp: 90 tablet, Rfl: 3    gabapentin  (NEURONTIN ) 250 mg/5 mL oral solution, Take 6 mL (300 mg total) by mouth every eight (8) hours for 14 days., Disp: 252 mL, Rfl: 0    lidocaine  (ASPERCREME) 4 % patch, Place 1 patch on the skin daily for 7 days., Disp: 10 patch, Rfl: 0    loratadine (CLARITIN) 10 mg tablet, Take 1 tablet (10 mg total) by mouth daily as needed for allergies., Disp: , Rfl:     [Paused] losartan  (COZAAR ) 100 MG tablet, Take 1 tablet (100 mg total) by mouth daily., Disp: 90 tablet, Rfl: 3    meloxicam  (MOBIC ) 15 MG tablet, TAKE 1 TABLET BY MOUTH ONCE DAILY AS NEEDED FOR PAIN, Disp: 30 tablet, Rfl: 0    multivit with min-folic acid (MULTIVITAMIN GUMMIES) 200 mcg Chew, Chew 1 tablet daily., Disp: , Rfl:     ONETOUCH DELICA PLUS LANCET 33 gauge Misc, CHECK BLOOD SUGAR AS DIRECTED ONCE A DAY AND FOR SYMPTOMS OF HIGH OR LOW BLOOD SUGAR, Disp: , Rfl:     oxyCODONE  (ROXICODONE ) 5 MG immediate release tablet, Take 1 tablet (5 mg total) by mouth every four (4) hours as needed for pain., Disp: 10 tablet, Rfl: 0    polyethylene glycol (MIRALAX ) 17 gram packet, Take 17 g (1 packet in 4  to 8 ounces of liquid) by mouth daily for 14 days., Disp: 14 packet, Rfl: 0    secukinumab  (COSENTYX  PEN, 2 PENS,) 150 mg/mL PnIj injection, Inject the contents of 2 pens (300 mg total) under the skin every twenty-eight (28) days., Disp: 2 mL, Rfl: 11    simvastatin  (ZOCOR ) 20 MG tablet, Take 1 tablet (20 mg total) by mouth nightly., Disp: 90 tablet, Rfl: 3    [Paused] spironolactone  (ALDACTONE ) 25 MG tablet, Take 1 tablet (25 mg total) by mouth daily., Disp: 90 tablet, Rfl: 3    tirzepatide  (MOUNJARO ) 12.5 mg/0.5 mL PnIj, Inject 12.5 mg under the skin every seven (7) days., Disp: 3 mL, Rfl: 4    topiramate  (TOPAMAX ) 25 MG tablet, Take 1 tablet (25 mg total) by mouth nightly., Disp: 30 tablet, Rfl: 2    triamcinolone  (KENALOG ) 0.1 % cream, Apply topically to active, raised areas twice daily as needed., Disp: 80 g, Rfl: 3  Allergies: Lisinopril, Latex, and Penicillins,    Review of systems   Review of systems was reviewed on attached notes/patient intake forms.      Physical Examination  There were no vitals taken for this visit.    General: well appearing, stated age, no distress   Communicates age appropriate, responds to speech well  Head - atraumatic, normocephalic   Face - no surface abnormalities  Eyes - no conjunctivitis, no scleritis/iritis, EOM full   Nose - external nose without deformity  Oral Cavity/Oropharynx/Lips:  Normal mucous membranes  Salivary Glands - no mass or asymmetry  Neck - no masses/adenopathy  Lymphatic - no neck lymphadenopathy  Cardiovascular - no clubbing/cyanosis/edema in hands  Respiratory - breathing comfortably, no audible wheezing or stridor  Psychiatric- appropriate mood and affect  Neurologic - cranial nerves 2-12 intact  Facial Strength - HB 1/6 bilaterally    Ears - External ear- normal, no lesions, no malformations   Otoscopy - ***    Audiogram  The 01/14/2024 audiogram was personally reviewed and interpreted. This demonstrates a ***  ***    Tympanogram  The tympanogram was personally reviewed and interpreted. This demonstrates ***    Imaging  I personally reviewed and interpreted the patients imaging studies. ***  CT Head Wo Contrast 11/14/20: Normal head CT.    I reviewed outside medical records.    Assessment and Plan  The patient is a 58 y.o. female with ***    I appreciate the opportunity to participate in her care.

## 2024-01-06 NOTE — Unmapped (Signed)
 [facility] Refill Coordination Note    Specialty Medication(s) to be Shipped:   Inflammatory Disorders: Cosentyx     Other medication(s) to be shipped: No additional medications requested for fill at this time    Specialty Medications not needed at this time: N/A     Tracy Robles, DOB: 11-30-1965  Phone: There are no phone numbers on file.      All above HIPAA information was verified with patient.     Was a Nurse, learning disability used for this call? No    Completed refill call assessment today to schedule patient's medication shipment from the [facility]  [phone]).  All relevant notes have been reviewed.     Specialty medication(s) and dose(s) confirmed: Regimen is correct and unchanged.   Changes to medications: Tracy Robles reports starting the following medications:  gabapentin  and oxycodone  for 2 weeks now.   Changes to insurance: No  New side effects reported not previously addressed with a pharmacist or physician: None reported  Questions for the pharmacist: No    Confirmed patient received a Conservation officer, historic buildings and a Surveyor, mining with first shipment. The patient will receive a drug information handout for each medication shipped and additional FDA Medication Guides as required.       DISEASE/MEDICATION-SPECIFIC INFORMATION        For patients on injectable medications: Next injection is scheduled for 01/10/2024.    SPECIALTY MEDICATION ADHERENCE     Medication Adherence    Specialty Medication: COSENTYX  PEN (2 PENS) 150 mg/mL Pnij injection (secukinumab )  Patient is on additional specialty medications: No              Were doses missed due to medication being on hold? No    COSENTYX  PEN (2 PENS) 150 mg/mL Pnij injection (secukinumab )  : 0 doses of medicine on hand     REFERRAL TO PHARMACIST     Referral to the pharmacist: Not needed      SHIPPING     Shipping address confirmed in Epic.     Cost and Payment: Patient has a $0 copay, payment information is not required.    Delivery Scheduled: Yes, Expected medication delivery date: 01/08/2024.     Medication will be delivered via Same Day Courier to the prescription address in Epic WAM.    Tracy Robles   [facility]  Specialty Technician

## 2024-01-08 MED FILL — COSENTYX PEN 300 MG/2 PENS (150 MG/ML) SUBCUTANEOUS PEN INJECTOR: SUBCUTANEOUS | 28 days supply | Qty: 2 | Fill #10

## 2024-01-09 NOTE — Unmapped (Signed)
 [facility] ACUTE CARE AND GENERAL SURGERY CLINIC NOTE      Patient Name: Tracy Robles  Medical Record Number: [id number]  Date of Service: 01/11/2024    Reason for Visit: Follow up s/p ex lap     History of Present Illness    Tracy Robles 58 y.o. female PMHx of psoriasis, eczema, DM, HTN, and obesity who presented to the ED on 9/9 with acute onset epigastric pain and emesis. CTA abdomen demonstrating small bowel obstruction with transition point in the right lower quadrant. The patient was taken to the OR on 12/23/2023 for diagnostic laparoscopy with laparoscopic lysis of adhesions converted to open laparotomy with repair of small bowel perforations x2. Operative findings were adhesive band from the uterus adhered to the small bowel causing small bowel obstruction with resultant upstream dilation of bowel; two small bowel perforations noted in the jejunum requiring repair; no bowel resection needed.  She tolerated the procedure well, was extubated in the OR, and received routine post-operative care before being transferred to the floor. Her diet was advanced and by discharge she was tolerating a regular diet. She was voiding adequately, ambulating independently, and her pain was controlled wth PO pain medications. She was discharged home on POD #6 in good condition.    Patient returns to clinic today with complaints of pain and drainage at her incision. She notes that it is leaking throughout the day and there is an area of greenish tissue inside her bellybutton. She notes that she feels like the area above her umbilicus is hard. She notes that she had burning pain around the area and pressure which she describes like holding her bladder too long. Patient denies any fever but does note that she has had some hot flashes. Reports eating and drinking fine and denies any nausea, vomiting, diarrhea or constipation.     She does endorse shortness of breath with minimal activity since surgery. She can take a deep breath, and is not having any pain with breathing but feels she is out of breath with only minor walking distances or physical exertion which was not the case before surgery.  She states this started about three days after surgery and has persisted since.       Reivew of Systems:  Negative except otherwise noted in the HPI.    Physical Exam:  Vitals:    01/11/24 1347   BP: 130/74   Pulse: 120   Temp: 36.6 ??C (97.8 ??F)   SpO2: 98%       General: awake, alert, afebrile, in NAD  Neuro: Alert and oriented ??3  Lungs: Shortness of breath after walking in from parking lot, which eventually lessened as patient sat in exam room. Normal accessory muscle use. Lungs clear to auscultation in upper fields, slightly diminished at base.  Cardiovascular: s1, s2, regular rhythm, elevated rate.  Abdomen: soft, obese, non distended. Crusted incision with staples in place. Patient unable to tolerate midline staple removal due to pain. Serous appearing drainage leaking from incision, and area of fibrinous exudate noted deeper inside umbilicus which was difficult to evaluate due to body habitus. No erythema, edema or ecchymosis noted.      Assessment & Plan    Tracy Robles 58 y.o. female PMHx of morbid obesity, psoriasis, eczema, DM, HTN, and obesity who presented to the ED on 9/9 with SBO and subsequently underwent ex lap with epair of small bowel perforations x2.    On exam patient with significant pain when attempting  staple removal and I was unable to perform this. Fluid leaking from umbilicus/ incision, and area of fibrinous exudate further inside umbilicus. In regards to her respiratory status, she is sating well on room air, but was notably SOB on presentation to triage after walking in from valet parking which she states is similar to what she has been experiencing since three days after her 9/10 operation. Also with some tachycardia, and after review of inpatient vitals she appeared to be tachycardic much of her admission.     I explained to the patient that my advise was that she present to the ED as she needs CT scan of likely chest, abdomen and pelvis. She has her elderly mother who cannot be left alone is with her today and her sister. Her mother is not able to wait in the ED. Suggested that sister take mother home and patient present, but patient felt it is better is she takes them home and comes back. She would prefer to go to [facility] given proximity to her home.     Advised EMS if interim if symptoms worsen.     Patient was agreeable to plan

## 2024-01-11 ENCOUNTER — Ambulatory Visit: Admit: 2024-01-11 | Discharge: 2024-01-14 | Payer: BLUE CROSS/BLUE SHIELD

## 2024-01-11 ENCOUNTER — Inpatient Hospital Stay: Admit: 2024-01-11 | Discharge: 2024-01-14 | Payer: BLUE CROSS/BLUE SHIELD

## 2024-01-11 ENCOUNTER — Ambulatory Visit: Admit: 2024-01-11 | Discharge: 2024-01-12 | Payer: BLUE CROSS/BLUE SHIELD

## 2024-01-11 DIAGNOSIS — R0602 Shortness of breath: Principal | ICD-10-CM

## 2024-01-11 DIAGNOSIS — Z9889 Other specified postprocedural states: Principal | ICD-10-CM

## 2024-01-11 LAB — CBC W/ AUTO DIFF
BASOPHILS ABSOLUTE COUNT: 0.1 10*9/L (ref 0.0–0.1)
BASOPHILS RELATIVE PERCENT: 0.8 %
EOSINOPHILS ABSOLUTE COUNT: 0.3 10*9/L (ref 0.0–0.5)
EOSINOPHILS RELATIVE PERCENT: 3.5 %
HEMATOCRIT: 30.3 % — ABNORMAL LOW (ref 34.0–44.0)
HEMOGLOBIN: 10.3 g/dL — ABNORMAL LOW (ref 11.3–14.9)
LYMPHOCYTES ABSOLUTE COUNT: 1.7 10*9/L (ref 1.1–3.6)
LYMPHOCYTES RELATIVE PERCENT: 17.8 %
MEAN CORPUSCULAR HEMOGLOBIN CONC: 34 g/dL (ref 32.0–36.0)
MEAN CORPUSCULAR HEMOGLOBIN: 28.8 pg (ref 25.9–32.4)
MEAN CORPUSCULAR VOLUME: 84.8 fL (ref 77.6–95.7)
MEAN PLATELET VOLUME: 6.9 fL (ref 6.8–10.7)
MONOCYTES ABSOLUTE COUNT: 1 10*9/L — ABNORMAL HIGH (ref 0.3–0.8)
MONOCYTES RELATIVE PERCENT: 10.3 %
NEUTROPHILS ABSOLUTE COUNT: 6.3 10*9/L (ref 1.8–7.8)
NEUTROPHILS RELATIVE PERCENT: 67.6 %
NUCLEATED RED BLOOD CELLS: 0 /100{WBCs} (ref <=4)
PLATELET COUNT: 572 10*9/L — ABNORMAL HIGH (ref 150–450)
RED BLOOD CELL COUNT: 3.58 10*12/L — ABNORMAL LOW (ref 3.95–5.13)
RED CELL DISTRIBUTION WIDTH: 14.4 % (ref 12.2–15.2)
WBC ADJUSTED: 9.3 10*9/L (ref 3.6–11.2)

## 2024-01-11 LAB — PRO-BNP: PRO-BNP: 70 pg/mL (ref <=300.0)

## 2024-01-11 LAB — HEPATIC FUNCTION PANEL
ALBUMIN: 3.3 g/dL — ABNORMAL LOW (ref 3.4–5.0)
ALKALINE PHOSPHATASE: 63 U/L (ref 46–116)
ALT (SGPT): 21 U/L (ref 10–49)
AST (SGOT): 22 U/L (ref <=34)
BILIRUBIN DIRECT: 0.2 mg/dL (ref 0.00–0.30)
BILIRUBIN TOTAL: 0.6 mg/dL (ref 0.3–1.2)
PROTEIN TOTAL: 7.9 g/dL (ref 5.7–8.2)

## 2024-01-11 LAB — BASIC METABOLIC PANEL
ANION GAP: 14 mmol/L (ref 5–14)
BLOOD UREA NITROGEN: 9 mg/dL (ref 9–23)
BUN / CREAT RATIO: 12
CALCIUM: 9.5 mg/dL (ref 8.7–10.4)
CHLORIDE: 105 mmol/L (ref 98–107)
CO2: 25.8 mmol/L (ref 20.0–31.0)
CREATININE: 0.75 mg/dL (ref 0.55–1.02)
EGFR CKD-EPI (2021) FEMALE: >90 mL/min/1.73m2 (ref >=60)
GLUCOSE RANDOM: 101 mg/dL (ref 70–179)
POTASSIUM: 4.4 mmol/L (ref 3.4–4.8)
SODIUM: 145 mmol/L (ref 135–145)

## 2024-01-11 LAB — PROTIME-INR
INR: 1.32
PROTIME: 15 s — ABNORMAL HIGH (ref 9.9–12.6)

## 2024-01-11 LAB — HIGH SENSITIVITY TROPONIN I - SINGLE: HIGH SENSITIVITY TROPONIN I: <3 ng/L (ref <=34)

## 2024-01-11 NOTE — Unmapped (Signed)
 Pt reports SOB ever since abdominal surgery on 12/22/23. +dry cough. She saw surgical team today and surgery recommendations were to present to ED for needs CT scan of likely chest, abdomen and pelvis. Also has leaking from incision site with an area of greenish tissue inside her bellybutton.

## 2024-01-11 NOTE — Unmapped (Signed)
 NA

## 2024-01-11 NOTE — Unmapped (Signed)
 [facility]  Emergency Department Provider Note     ED Clinical Impression     Final diagnoses:   Exertional dyspnea (Primary)   Surgical site infection      Impression, Medical Decision Making, ED Course     Impression: 58 y.o. female with PMH most significant for type 2 diabetes mellitus, HTN, obesity, recent small bowel obstruction status post open laparotomy with perforation repair and lysis of adhesions who presents to the ED with abdominal pain, exertional dyspnea, and drainage from surgical site.  Upon initial evaluation, patient is alert, oriented, and in no acute distress.  Vital signs notable for mildly elevated blood pressure but otherwise normal vitals.  Physical exam notable for soft, nondistended abdomen with localized tenderness surrounding surgical incision site as well as diffuse tenderness in the lower quadrants and suprapubic area.  Scant, serosanguineous discharge notable in the umbilicus without malodorous or purulent discharge present.    DDx/MDM: Surgical site infection, wound dehiscence, postsurgical hematoma, subcutaneous abscess, cellulitis, umbilical hernia, retained foreign body.      Given presence of discharge, possibility of surgical site infection cannot be ruled out.  Adequate blood pressure without tachycardia, fever, or sick symptoms is reassuring against sepsis or septic shock.  Exertional dyspnea which is new following surgery was certainly concerning for pulmonary embolism, however CT angiogram is reassuring against this diagnosis.  Similarly, normal troponin and BNP reassuring against ACS and CHF.  Stable hemoglobin compared to prior recent measurements reassuring against hemodynamically significant hematoma.    Will proceed with CT abdomen pelvis without contrast to better characterize area underlying surgical site.     Orders Placed This Encounter   Procedures    Influenza/ RSV/COVID PCR    CTA Chest W Contrast    CT abdomen pelvis without contrast    CBC w/ Differential Protime-INR    Basic metabolic panel    Pro-BNP    Hepatic function panel (LFT's)    hsTroponin I (single, no delta)    Special Airborne/Contact Isolation Status    ECG 12 Lead    ECG 12 Lead     ED Course as of 01/12/24 0200   Tue Jan 12, 2024   0157 1:59 AM  Patient signed out to oncoming emergency medicine doctor.  Disposition pending at time of signout, however if CT reassuring against acute process patient will likely be safe for discharge and close outpatient general surgery follow-up.       MDM Elements  Discussion of Management with other Physicians, QHP or Appropriate Source: None  Independent Interpretation of Studies: CT angiogram negative for PE.  CT abdomen pelvis pending at time of signout.  I have reviewed recent and relevant previous record, including: Recent outpatient general surgery follow-up visit note reviewed as well as inpatient operative notes.  Escalation of Care including OBS/Admission/Transfer was considered: Patient disposition pending at time of signout to oncoming EM provider.  Social Determinants that significantly affected care: None  Prescription drugs considered but not prescribed: None  Diagnostic tests considered but not performed: None  ____________________________________________    The case was discussed with the attending physician, who is in agreement with the above assessment and plan.      History     Chief Complaint  Chief Complaint   Patient presents with    Shortness of Breath     HPI   Tracy Robles is a 58 y.o. female with past medical history of DM, HTN, obesity, and recent SBO status post open laparotomy  with small bowel perforation repair and lysis of adhesions who presents to ED with abdominal pain and shortness of breath.  Patient was seen earlier today in the outpatient setting by general surgery and was noted to have leaking of fluid around her incision site and umbilicus as well as exertional shortness of breath which is new since surgery.  Patient was referred to the emergency department for CT imaging to assess for possible pulmonary embolism as well as surgical site infection, hematoma, or other postsurgical abnormality.  Upon initial evaluation in the ED, patient endorses moderate abdominal pain at site of surgical incision as well as in her lower abdomen, notably during defecation and urination.  Patient denies any hematochezia, melena, dysuria, hematuria.  Patient noted onset of reddish, thin discharge from her surgical site on Saturday.  Patient denies any drainage of puslike fluid from her surgical site.  Patient denies any fever, nausea, vomiting, chills at home.  Abdominal pain had previously been well-controlled with oxycodone     Past Medical History[1]    Past Surgical History[2]    Active Medications[3]     Allergies[4]    Family History[5]    Short Social History[6]     Physical Exam     VITAL SIGNS:      Vitals:    01/11/24 1705 01/11/24 2021 01/11/24 2216 01/11/24 2359   BP: 156/83 160/81 141/79 138/74   Pulse: 102 102 97    Resp: 20 20 16     Temp: 37.4 ??C (99.3 ??F) 36.9 ??C (98.4 ??F)     TempSrc: Oral Temporal     SpO2: 98% 99%  100%   Weight: (!) 123.8 kg (273 lb)          Constitutional: Alert and oriented. No acute distress.  Cardiovascular: Rate as above, regular rhythm. Normal and symmetric distal pulses.  Respiratory: Normal respiratory effort. Breath sounds are normal. There are no wheezing or crackles heard.  Gastrointestinal: Soft, nondistended abdomen.  Tender to palpation of the periumbilical region near surgical incision site.  Incision appears intact, with scant clear discharge present in the umbilicus.  No appreciable subcutaneous mass or fluctuance.  No purulent or malodorous discharge present.  Genitourinary: Deferred.  Neurologic: Normal speech and language. No gross focal neurologic deficits are appreciated. Patient is moving all extremities equally, face is symmetric at rest and with speech.  Psychiatric: Mood and affect are normal. Speech and behavior are normal.     Radiology     CTA Chest W Contrast   Final Result      No pulmonary embolism or other acute findings.         CT abdomen pelvis without contrast    (Results Pending)       Pertinent labs & imaging results that were available during my care of the patient were independently interpreted by me and considered in my medical decision making (see chart for details).    Portions of this record have been created using Scientist, clinical (histocompatibility and immunogenetics). Dictation errors have been sought, but may not have been identified and corrected.           [1]   Past Medical History:  Diagnosis Date    Allergic Don't know    Penicillin    Arthritis 2011    Lower back    BMI 60.0-69.9, adult (CMS-HCC) 05/29/2021    Diabetes mellitus    (CMS-HCC)     Eczema     High blood pressure     Psoriasis    [  2]   Past Surgical History:  Procedure Laterality Date    PR ENTEROTOMY,NON-DUOD,EXPLOR/BX/FB REMOVAL N/A 12/22/2023    Procedure: ENTEROTOMY, SM INTESTINE, OTHER THAN DUODENUM; FOR EXPL, BX, OR FOREIGN BODY REMOVAL;  Surgeon: Vicci Janas CROME., MD;  Location: OR [facility];  Service: Trauma    PR EXPLORATORY OF ABDOMEN N/A 12/22/2023    Procedure: EXPLORATORY LAPAROTOMY, EXPLORATORY CELIOTOMY WITH OR WITHOUT BIOPSY(S);  Surgeon: Vicci Janas CROME., MD;  Location: OR [facility];  Service: Trauma    PR LAP, SURG ENTEROLYSIS N/A 12/22/2023    Procedure: LAPAROSCOPY, SURGICAL, ENTEROLYSIS (FREEING OF INTESTINAL ADHESION) (SEPARATE PROCEDURE);  Surgeon: Vicci Janas CROME., MD;  Location: OR [facility];  Service: Trauma    PR LAP,DIAGNOSTIC ABDOMEN N/A 12/22/2023    Procedure: DIAGNOSTIC AND OR OPERATIVE LAPAROSCOPY;  Surgeon: Vicci Janas CROME., MD;  Location: OR [facility];  Service: Trauma   [3]   No current facility-administered medications for this encounter.     Current Outpatient Medications   Medication Sig Dispense Refill    blood sugar diagnostic (GLUCOSE BLOOD) Strp Check blood sugar as directed once a day and for symptoms of high or low blood sugar. 100 strip 3    [Paused] blood-glucose meter kit Use as instructed (Patient not taking: Reported on 01/11/2024) 1 each 0    blood-glucose sensor Devi Use as directed 5 each 6    cetirizine  (ZYRTEC ) 5 MG tablet Take 1 tablet (5 mg total) by mouth once as needed.      clobetasoL  (TEMOVATE ) 0.05 % external solution Apply topically to scalp twice daily. 50 mL 6    empagliflozin  (JARDIANCE ) 10 mg tablet Take 1 tablet (10 mg total) by mouth daily. 90 tablet 3    gabapentin  (NEURONTIN ) 250 mg/5 mL oral solution Take 6 mL (300 mg total) by mouth every eight (8) hours for 14 days. 252 mL 0    loratadine (CLARITIN) 10 mg tablet Take 1 tablet (10 mg total) by mouth daily as needed for allergies.      [Paused] losartan  (COZAAR ) 100 MG tablet Take 1 tablet (100 mg total) by mouth daily. (Patient not taking: Reported on 01/11/2024) 90 tablet 3    meloxicam  (MOBIC ) 15 MG tablet TAKE 1 TABLET BY MOUTH ONCE DAILY AS NEEDED FOR PAIN 30 tablet 0    multivit with min-folic acid (MULTIVITAMIN GUMMIES) 200 mcg Chew Chew 1 tablet daily.      ONETOUCH DELICA PLUS LANCET 33 gauge Misc CHECK BLOOD SUGAR AS DIRECTED ONCE A DAY AND FOR SYMPTOMS OF HIGH OR LOW BLOOD SUGAR      oxyCODONE  (ROXICODONE ) 5 MG immediate release tablet Take 1 tablet (5 mg total) by mouth every four (4) hours as needed for pain. 10 tablet 0    secukinumab  (COSENTYX  PEN, 2 PENS,) 150 mg/mL PnIj injection Inject the contents of 2 pens (300 mg total) under the skin every twenty-eight (28) days. 2 mL 11    simvastatin  (ZOCOR ) 20 MG tablet Take 1 tablet (20 mg total) by mouth nightly. 90 tablet 3    [Paused] spironolactone  (ALDACTONE ) 25 MG tablet Take 1 tablet (25 mg total) by mouth daily. 90 tablet 3    tirzepatide  (MOUNJARO ) 12.5 mg/0.5 mL PnIj Inject 12.5 mg under the skin every seven (7) days. 3 mL 4    topiramate  (TOPAMAX ) 25 MG tablet Take 1 tablet (25 mg total) by mouth nightly. 30 tablet 2    triamcinolone  (KENALOG ) 0.1 % cream Apply topically to active, raised areas twice daily as needed. 80  g 3   [4]   Allergies  Allergen Reactions    Lisinopril Cough    Penicillins      Other reaction(s): UNSPECIFIED    Latex Rash     Other reaction(s): UNKNOWN    latex   [5]   Family History  Problem Relation Age of Onset    Diabetes Father     Hypertension Father     Kidney disease Father     Clotting disorder Father         Blood clot    Asthma Father     Diabetes Sister     Miscarriages / Stillbirths Sister     Hypertension Mother     Arthritis Mother     Breast cancer Maternal Aunt     Stroke Maternal Grandmother     Miscarriages / Stillbirths Maternal Grandmother     Melanoma Neg Hx     Basal cell carcinoma Neg Hx     Squamous cell carcinoma Neg Hx     Ovarian cancer Neg Hx    [6]   Social History  Tobacco Use    Smoking status: Never     Passive exposure: Current    Smokeless tobacco: Never   Vaping Use    Vaping status: Never Used   Substance Use Topics    Alcohol use: Yes     Alcohol/week: 1.0 standard drink of alcohol     Comment: Only on Saturday    Drug use: Never        Lexys Milliner, Beverley BROCKS, MD  Resident  01/12/24 0200

## 2024-01-12 LAB — HEMOGLOBIN AND HEMATOCRIT, BLOOD
HEMATOCRIT: 30 % — ABNORMAL LOW (ref 34.0–44.0)
HEMATOCRIT: 28.8 % — ABNORMAL LOW (ref 34.0–44.0)
HEMATOCRIT: 28.1 % — ABNORMAL LOW (ref 34.0–44.0)
HEMOGLOBIN: 9.4 g/dL — ABNORMAL LOW (ref 11.3–14.9)
HEMOGLOBIN: 9.8 g/dL — ABNORMAL LOW (ref 11.3–14.9)
HEMOGLOBIN: 9.5 g/dL — ABNORMAL LOW (ref 11.3–14.9)

## 2024-01-12 MED ADMIN — oxyCODONE (ROXICODONE) immediate release tablet 10 mg: 10 mg | ORAL | @ 20:00:00 | Stop: 2024-01-16

## 2024-01-12 MED ADMIN — iohexol (OMNIPAQUE) 350 mg iodine/mL solution 75 mL: 75 mL | INTRAVENOUS | @ 02:00:00 | Stop: 2024-01-11

## 2024-01-12 MED ADMIN — oxyCODONE (ROXICODONE) immediate release tablet 10 mg: 10 mg | ORAL | @ 03:00:00 | Stop: 2024-01-11

## 2024-01-12 MED ADMIN — morphine 4 mg/mL injection 4 mg: 4 mg | INTRAVENOUS | @ 13:00:00

## 2024-01-12 NOTE — Unmapped (Addendum)
 49F with h/o psoriasis, eczema, DM, HTN, obesity, prior SBO s/p diagnostic laparoscopy with LOA converted to open laparotomy with repair of small bowel perforations x2 on 12/23/23. She presented to her clinic follow-up appointment with general surgery reporting pain and drainage from her incision. She was instructed to present to the ED for further evaluation.     On presentation to the ED, she was afebrile and HDS with Hgb 10.3. CT A/P indicating a large subacute intraperitoneal hematoma extending from left mid abdomen to the deep pelvis. She was admitted for observation to trend hemoglobin, control pain, and perform wound care. Hemoglobin remained stable throughout her hospital stay. The abdominal incision was managed with dry dressing which was transitioned to Maxorb alginate on day of discharge. Home Health was requested per patient request for assistance with wound care. By hospital day #3, she met criteria for discharge. Discharge instructions were explained. Her index surgical team was contacted to arrange for close outpatient follow up. Should Home Health deny the patient, it was ensured at discharge that patient would have an experienced family member assist with dressing changes.

## 2024-01-12 NOTE — Unmapped (Signed)
 [facility] GENERAL SURGERY  - New Inpatient Consult Note -  01/12/2024       Requesting Attending Physician:  Chiquita Francis Bare, MD  Service Requesting Consult:  Emergency Department  Service Providing Consult: [facility] - GEN SURG [facility] GENERAL AND BARIATRIC SURGERY [facility]   Consulting Attending: Vicenta Gee, MD      Impression:  Patient is a 58 y.o. female with post operative intrabadominal fluid collection consistent with hematoma.        Recommendations:  Admit to the general surgery service, under Dr. Gee  Serial Hcts  Wound care          History of Present Illness:   Reason for Consult:  Shortness of breath, Abdominal wall drainage and intrabdominal hematoma    Romero Leobardo Easterly is seen in consultation for abdominal wall drainage and intrabdominal hematoma at the request of Chiquita Francis Bare, MD from the Emergency Department.    Jiya Kissinger Maske 58 y.o. female PMHx of psoriasis, eczema, DM, HTN, and obesity who presented to the ED on 9/9 with acute onset epigastric pain and emesis. CTA abdomen demonstrating small bowel obstruction with transition point in the right lower quadrant. The patient was taken to the OR on 12/23/2023 for diagnostic laparoscopy with laparoscopic lysis of adhesions converted to open laparotomy with repair of small bowel perforations x2. Operative findings were adhesive band from the uterus adhered to the small bowel causing small bowel obstruction with resultant upstream dilation of bowel; two small bowel perforations noted in the jejunum requiring repair; no bowel resection needed.  She tolerated the procedure well, was extubated in the OR, and received routine post-operative care before being transferred to the floor. Her diet was advanced and by discharge she was tolerating a regular diet. She was voiding adequately, ambulating independently, and her pain was controlled wth PO pain medications. She was discharged home on POD #6 in good condition.     Patient returned to clinic yesterday with complaints of pain and drainage at her incision. She notes that it is leaking throughout the day and there is an area of greenish tissue inside her bellybutton. Patient denies any fever but does note that she has had some hot flashes. Reports eating and drinking fine and denies any nausea, vomiting, diarrhea or constipation.     She also complained of  shortness of breath with minimal activity since surgery. She can take a deep breath, and is not having any pain with breathing but feels she is out of breath with only minor walking distances or physical exertion which was not the case before surgery.  She states this started about three days after surgery and has persisted since.     She was told to come to the ER to be evaluated further with a CT scan of the C/A/P      Allergies:  Allergies[1]    Medications:  Current Rx[2]     Past Medical History:    Past Medical History[3]    Past Surgical History:    Past Surgical History[4]    Family History:    Family History[5]        Review of Systems  History obtained from the patient   12 point review of systems is otherwise negative except for the pertinent positives and negatives as per HPI.      Objective:   BP 130/66  - Pulse 97  - Temp 36.9 ??C (98.4 ??F) (Temporal)  - Resp 16  - Wt ROLLEN)  123.8 kg (273 lb)  - SpO2 100%  - BMI 49.92 kg/m??   Estimated body mass index is 49.92 kg/m?? as calculated from the following:    Height as of 04/10/23: 157.5 cm (5' 2.01).    Weight as of this encounter: 123.8 kg (273 lb).    Intake/Output last 3 shifts:  No intake/output data recorded.    Physical Exam  GENERAL:  Pleasant, no acute distress.     NEURO:  Alert and oriented   PSYCH: Affect appropriate. Judgment and insight normal.  HEENT: Sclerae anicteric.    COR: RRR  RESP: Unlabored. Clear    SKIN: No unusual rash, ecchymoses or petechiae seen in the exposed areas.  ZKU:Qloo ROM, Pulses intact  ABDOMEN:   soft nontender.  Some clear drainage from midline wound where staples are and some skin separation      Most Recent Labs:  All lab results last 24 hours:    Recent Results (from the past 24 hours)   ECG 12 Lead    Collection Time: 01/11/24  4:58 PM   Result Value Ref Range    EKG Systolic BP  mmHg    EKG Diastolic BP  mmHg    EKG Ventricular Rate 109 BPM    EKG Atrial Rate 109 BPM    EKG P-R Interval 158 ms    EKG QRS Duration 72 ms    EKG Q-T Interval 338 ms    EKG QTC Calculation 455 ms    EKG Calculated P Axis 44 degrees    EKG Calculated R Axis 20 degrees    EKG Calculated T Axis 51 degrees    QTC Fredericia 412 ms   Protime-INR    Collection Time: 01/11/24  8:28 PM   Result Value Ref Range    PT 15.0 (H) 9.9 - 12.6 sec    INR 1.32    Basic metabolic panel    Collection Time: 01/11/24  8:28 PM   Result Value Ref Range    Sodium 145 135 - 145 mmol/L    Potassium 4.4 3.4 - 4.8 mmol/L    Chloride 105 98 - 107 mmol/L    CO2 25.8 20.0 - 31.0 mmol/L    Anion Gap 14 5 - 14 mmol/L    BUN 9 9 - 23 mg/dL    Creatinine 9.24 9.44 - 1.02 mg/dL    BUN/Creatinine Ratio 12     eGFR CKD-EPI (2021) Female >90 >=60 mL/min/1.57m2    Glucose 101 70 - 179 mg/dL    Calcium 9.5 8.7 - 89.5 mg/dL   Pro-BNP    Collection Time: 01/11/24  8:28 PM   Result Value Ref Range    PRO-BNP 70.0 <=300.0 pg/mL   CBC w/ Differential    Collection Time: 01/11/24  8:28 PM   Result Value Ref Range    WBC 9.3 3.6 - 11.2 10*9/L    RBC 3.58 (L) 3.95 - 5.13 10*12/L    HGB 10.3 (L) 11.3 - 14.9 g/dL    HCT 69.6 (L) 65.9 - 44.0 %    MCV 84.8 77.6 - 95.7 fL    MCH 28.8 25.9 - 32.4 pg    MCHC 34.0 32.0 - 36.0 g/dL    RDW 85.5 87.7 - 84.7 %    MPV 6.9 6.8 - 10.7 fL    Platelet 572 (H) 150 - 450 10*9/L    nRBC 0 <=4 /100 WBCs    Neutrophils % 67.6 %    Lymphocytes %  17.8 %    Monocytes % 10.3 %    Eosinophils % 3.5 %    Basophils % 0.8 %    Absolute Neutrophils 6.3 1.8 - 7.8 10*9/L    Absolute Lymphocytes 1.7 1.1 - 3.6 10*9/L    Absolute Monocytes 1.0 (H) 0.3 - 0.8 10*9/L    Absolute Eosinophils 0.3 0.0 - 0.5 10*9/L    Absolute Basophils 0.1 0.0 - 0.1 10*9/L   Hepatic function panel (LFT's)    Collection Time: 01/11/24  8:28 PM   Result Value Ref Range    Albumin 3.3 (L) 3.4 - 5.0 g/dL    Total Protein 7.9 5.7 - 8.2 g/dL    Total Bilirubin 0.6 0.3 - 1.2 mg/dL    Bilirubin, Direct 9.79 0.00 - 0.30 mg/dL    AST 22 <=65 U/L    ALT 21 10 - 49 U/L    Alkaline Phosphatase 63 46 - 116 U/L   hsTroponin I (single, no delta)    Collection Time: 01/11/24  8:28 PM   Result Value Ref Range    hsTroponin I <3 <=34 ng/L   Influenza/ RSV/COVID PCR    Collection Time: 01/12/24 12:00 AM    Specimen: Nasopharyngeal Swab   Result Value Ref Range    SARS-CoV-2 PCR Negative Negative    Influenza A Negative Negative    Influenza B Negative Negative    RSV Negative Negative       IMAGING:  CT abdomen pelvis without contrast  Result Date: 01/12/2024  EXAM: CT ABDOMEN PELVIS WO CONTRAST ACCESSION: [id number] UN REPORT DATE: 01/12/2024 1:37 AM CLINICAL INDICATION: 58 years old with drainage from incision, recent sbo  COMPARISON: 12/22/2023 TECHNIQUE: A spiral CT scan was obtained without IV contrast from the lung bases to the pubic symphysis.  Images were reconstructed in the axial plane. Coronal and sagittal reformatted images were also provided for further evaluation. Evaluation of the solid organs and vasculature is limited in the absence of intravenous contrast. FINDINGS: Bibasilar atelectasis. Trace left pleural effusion. No acute abnormality identified involving the unenhanced solid abdominal viscera. Bowel loops are normal in caliber. No free air. The renal collecting systems and urinary bladder are opacified with dense excreted contrast material. A large and heterogeneous fluid collection in the left peritoneal cavity contains regions of internal hyperdensity and has developed since the prior study performed on 12/22/2023. This collection is relatively well-circumscribed and extends from the level of the lower left kidney to the deep pelvis, measuring up to 20.9 cm in greatest craniocaudal extent. Maximal axial measurements are 5.9 x 10.2 cm at its superior aspect and 9.7 x 6.2 cm immediately superior to the bladder. Postsurgical changes along the anterior abdominal wall with ill-defined fluid along the incision and minimal inflammatory stranding.     1. Large subacute intraperitoneal hematoma, extending from the left mid abdomen to the deep pelvis. 2. Postsurgical changes in the ventral abdominal wall. Small amount of fluid deep to the incision site, the sterility of which cannot be determined by CT.     CTA Chest W Contrast  Result Date: 01/11/2024  EXAM: CTA CHEST W CONTRAST ACCESSION: [id number] UN REPORT DATE: 01/11/2024 10:05 PM CLINICAL INDICATION: r/o post op pe TECHNIQUE: Contiguous axial images were reconstructed through the chest following a single breath hold helical acquisition during the administration of intravenous contrast material. Images were reformatted in the coronal and sagittal planes. MIP slabs were also constructed. COMPARISON: CTA aortic dissection study 12/22/2023 FINDINGS: PULMONARY ARTERIES: No embolus in either lung.  Main pulmonary artery is normal in size. HEART/VASCULATURE: Cardiac chambers are normal in size. No pericardial effusion. Aorta is normal in caliber. LUNGS/AIRWAYS/PLEURA: Trachea and large airways are patent. Lungs are clear. Bibasilar linear subsegmental atelectasis. No pleural effusion or pneumothorax. MEDIASTINUM/THORACIC INLET: No enlarged supraclavicular or intrathoracic lymph nodes. No mediastinal mass or thyroid abnormality. Patulous distal esophagus. UPPER ABDOMEN: Normal. CHEST WALL/BONES: No enlarged axillary lymph nodes. Mild degenerative changes of the thoracic spine. DEVICES: None.     No pulmonary embolism or other acute findings.     ECG 12 Lead  Result Date: 01/11/2024  SINUS TACHYCARDIA OTHERWISE NORMAL ECG WHEN COMPARED WITH ECG OF 22-Dec-2023 09:46, NO SIGNIFICANT CHANGE WAS FOUND Confirmed by Leni Mings ([id number]) on 01/11/2024 9:41:52 PM      ASSESSMENT: Admit fo make sure heamtoma stable and patient not developing an EC Fistula    PLAN:Admit to Surgery, Serial Hcts, Wound care    Vicenta Gee, MD  General Surgery/Surgical Oncology          [1]   Allergies  Allergen Reactions    Lisinopril Cough    Penicillins      Other reaction(s): UNSPECIFIED    Latex Rash     Other reaction(s): UNKNOWN    latex   [2]   No current facility-administered medications for this encounter.     Current Outpatient Medications   Medication Sig Dispense Refill    blood sugar diagnostic (GLUCOSE BLOOD) Strp Check blood sugar as directed once a day and for symptoms of high or low blood sugar. 100 strip 3    [Paused] blood-glucose meter kit Use as instructed (Patient not taking: Reported on 01/11/2024) 1 each 0    blood-glucose sensor Devi Use as directed 5 each 6    cetirizine  (ZYRTEC ) 5 MG tablet Take 1 tablet (5 mg total) by mouth once as needed.      clobetasoL  (TEMOVATE ) 0.05 % external solution Apply topically to scalp twice daily. 50 mL 6    empagliflozin  (JARDIANCE ) 10 mg tablet Take 1 tablet (10 mg total) by mouth daily. 90 tablet 3    gabapentin  (NEURONTIN ) 250 mg/5 mL oral solution Take 6 mL (300 mg total) by mouth every eight (8) hours for 14 days. 252 mL 0    loratadine (CLARITIN) 10 mg tablet Take 1 tablet (10 mg total) by mouth daily as needed for allergies.      [Paused] losartan  (COZAAR ) 100 MG tablet Take 1 tablet (100 mg total) by mouth daily. (Patient not taking: Reported on 01/11/2024) 90 tablet 3    meloxicam  (MOBIC ) 15 MG tablet TAKE 1 TABLET BY MOUTH ONCE DAILY AS NEEDED FOR PAIN 30 tablet 0    multivit with min-folic acid (MULTIVITAMIN GUMMIES) 200 mcg Chew Chew 1 tablet daily.      ONETOUCH DELICA PLUS LANCET 33 gauge Misc CHECK BLOOD SUGAR AS DIRECTED ONCE A DAY AND FOR SYMPTOMS OF HIGH OR LOW BLOOD SUGAR      oxyCODONE  (ROXICODONE ) 5 MG immediate release tablet Take 1 tablet (5 mg total) by mouth every four (4) hours as needed for pain. 10 tablet 0    secukinumab  (COSENTYX  PEN, 2 PENS,) 150 mg/mL PnIj injection Inject the contents of 2 pens (300 mg total) under the skin every twenty-eight (28) days. 2 mL 11    simvastatin  (ZOCOR ) 20 MG tablet Take 1 tablet (20 mg total) by mouth nightly. 90 tablet 3    [Paused] spironolactone  (ALDACTONE ) 25 MG tablet Take 1 tablet (25 mg  total) by mouth daily. 90 tablet 3    tirzepatide  (MOUNJARO ) 12.5 mg/0.5 mL PnIj Inject 12.5 mg under the skin every seven (7) days. 3 mL 4    topiramate  (TOPAMAX ) 25 MG tablet Take 1 tablet (25 mg total) by mouth nightly. 30 tablet 2    triamcinolone  (KENALOG ) 0.1 % cream Apply topically to active, raised areas twice daily as needed. 80 g 3   [3]   Past Medical History:  Diagnosis Date    Allergic Don't know    Penicillin    Arthritis 2011    Lower back    BMI 60.0-69.9, adult (CMS-HCC) 05/29/2021    Diabetes mellitus    (CMS-HCC)     Eczema     High blood pressure     Psoriasis    [4]   Past Surgical History:  Procedure Laterality Date    PR ENTEROTOMY,NON-DUOD,EXPLOR/BX/FB REMOVAL N/A 12/22/2023    Procedure: ENTEROTOMY, SM INTESTINE, OTHER THAN DUODENUM; FOR EXPL, BX, OR FOREIGN BODY REMOVAL;  Surgeon: Vicci Janas CROME., MD;  Location: OR [facility];  Service: Trauma    PR EXPLORATORY OF ABDOMEN N/A 12/22/2023    Procedure: EXPLORATORY LAPAROTOMY, EXPLORATORY CELIOTOMY WITH OR WITHOUT BIOPSY(S);  Surgeon: Vicci Janas CROME., MD;  Location: OR [facility];  Service: Trauma    PR LAP, SURG ENTEROLYSIS N/A 12/22/2023    Procedure: LAPAROSCOPY, SURGICAL, ENTEROLYSIS (FREEING OF INTESTINAL ADHESION) (SEPARATE PROCEDURE);  Surgeon: Vicci Janas CROME., MD;  Location: OR [facility];  Service: Trauma    PR LAP,DIAGNOSTIC ABDOMEN N/A 12/22/2023    Procedure: DIAGNOSTIC AND OR OPERATIVE LAPAROSCOPY;  Surgeon: Vicci Janas CROME., MD;  Location: OR [facility];  Service: Trauma   [5]   Family History  Problem Relation Age of Onset    Diabetes Father     Hypertension Father     Kidney disease Father     Clotting disorder Father         Blood clot    Asthma Father     Diabetes Sister     Miscarriages / Stillbirths Sister     Hypertension Mother     Arthritis Mother     Breast cancer Maternal Aunt     Stroke Maternal Grandmother     Miscarriages / Stillbirths Maternal Grandmother     Melanoma Neg Hx     Basal cell carcinoma Neg Hx     Squamous cell carcinoma Neg Hx     Ovarian cancer Neg Hx

## 2024-01-12 NOTE — Unmapped (Addendum)
 Daily Progress Note        Subjective:    Patient reports that her abdominal pain is tolerable.  Last med was oxycodone  10 mg 8 hours ago.  She denies nausea, vomiting, fever, chills.  Reports last BM yesterday.  She has not been able to pass gas since yesterday.      Objective:    Vital signs in last 24 hours:  Temp:  [36.6 ??C (97.8 ??F)-37.4 ??C (99.3 ??F)] 37.3 ??C (99.1 ??F)  Pulse:  [95-120] 95  SpO2 Pulse:  [105] 105  Resp:  [16-20] 20  BP: (130-160)/(61-83) 141/61  MAP (mmHg):  [85-104] 96  SpO2:  [98 %-100 %] 98 %    Intake/Output last 3 shifts:  No intake/output data recorded.  Intake/Output this shift:  No intake/output data recorded.    Physical Exam:  General appearance: alert, cooperative, and no distress  Lungs: normal respiratory effort in no acute respiratory distress  Heart: RR  Abdomen: soft, non-distended, umbilical with globular exudate at the center of incision with wound dehiscence.  Upon palpation, serous non-malodorous fluid expressed.  There is a shallow 0.5cm wound underlying the exudate that appears beefy red and not necrotic.  No purulence.  No warmth or erythema at the incision.  Other than this 3 cm area, the staple line is otherwise intact.     Psych: normal mood and affect    Lab Results   Component Value Date    WBC 9.3 01/11/2024    HGB 10.3 (L) 01/11/2024    HCT 30.3 (L) 01/11/2024    PLT 572 (H) 01/11/2024       Lab Results   Component Value Date    NA 145 01/11/2024    K 4.4 01/11/2024    CL 105 01/11/2024    CO2 25.8 01/11/2024    BUN 9 01/11/2024    CREATININE 0.75 01/11/2024    GLU 101 01/11/2024    CALCIUM 9.5 01/11/2024    MG 2.0 12/27/2023    PHOS 2.6 12/27/2023       Lab Results   Component Value Date    BILITOT 0.6 01/11/2024    BILIDIR 0.20 01/11/2024    PROT 7.9 01/11/2024    ALBUMIN 3.3 (L) 01/11/2024    ALT 21 01/11/2024    AST 22 01/11/2024    ALKPHOS 63 01/11/2024       Lab Results   Component Value Date    INR 1.32 01/11/2024         Assessment/Plan:    Tracy Robles 58 y.o. female PMHx of psoriasis, eczema, DM, HTN, and obesity who presented to the ED on 9/9 with acute onset epigastric pain and emesis. CTA abdomen demonstrating small bowel obstruction with transition point in the right lower quadrant. The patient was taken to the OR on 12/22/2023 for diagnostic laparoscopy with laparoscopic lysis of adhesions converted to open laparotomy with repair of small bowel perforations x2. She was seen in the ED and admitted for concern of abdominal pain and CT findings concerning for intra-peritoneal hematoma, extending from the left mid abdomen to the deep pelvis.      Admitting at this time for observation and monitoring with serial labs for hematoma.   Staples removed and wound packed with small amount of  2 inch Kerlix.      Neuro: tylenol  prn, oxycodone  prn, morphine  prn    CV: HDS, hydrazaline prn, hold home meds    Pulm: stable on RA. IS. OOB as  much as possible.     FEN/GI: CLD.   Replete electrolytes PRN       GU: Adequate UOP per patient  *Foley: Voiding spontaneously    Heme/ID: Afebrile.   No leukocytosis..    -Hgb 10.3/hematocrit 30.3--serial labs q6hrs     Endo: stable, Insulin  protocol. Hold home meds    Disposition: Admit to Floor status for observation.     General Surgery on-call can be reached at pager [phone]    Corean Du, PA-C  General Surgery  01/12/2024  8:06 AM     General and Acute Care Surgery Faculty:  I have seen the patient with the PA, discussed the case, performed physical examination, reviewed imaging and labs and agree with above documentation.    Tracy Robles is being admitted for hydration, monitoring, and wound care. Staples were removed at the mid-line incision with serous drainage expressed. Wound packed with gauze packing. Will continue to follow.    Delon Lesser, MD  General and Acute Care Surgery

## 2024-01-12 NOTE — Unmapped (Signed)
Same day consult note to serve as admission H&P

## 2024-01-12 NOTE — Unmapped (Signed)
 Problem: Adult Inpatient Plan of Care  Goal: Absence of Hospital-Acquired Illness or Injury  Outcome: Progressing  Intervention: Prevent Skin Injury  Recent Flowsheet Documentation  Taken 01/12/2024 1230 by Yusuf Yu, RN  Device Skin Pressure Protection: absorbent pad utilized/changed  Skin Protection: adhesive use limited  Goal: Optimal Comfort and Wellbeing  Outcome: Progressing  Goal: Readiness for Transition of Care  Outcome: Progressing  Goal: Rounds/Family Conference  Outcome: Progressing     Problem: Latex Allergy  Goal: Absence of Allergy Symptoms  Outcome: Progressing

## 2024-01-13 LAB — HEMOGLOBIN AND HEMATOCRIT, BLOOD
HEMATOCRIT: 28.6 % — ABNORMAL LOW (ref 34.0–44.0)
HEMOGLOBIN: 9.4 g/dL — ABNORMAL LOW (ref 11.3–14.9)

## 2024-01-13 LAB — BASIC METABOLIC PANEL
ANION GAP: 14 mmol/L (ref 5–14)
BLOOD UREA NITROGEN: 8 mg/dL — ABNORMAL LOW (ref 9–23)
BUN / CREAT RATIO: 10
CALCIUM: 9.1 mg/dL (ref 8.7–10.4)
CHLORIDE: 101 mmol/L (ref 98–107)
CO2: 26.4 mmol/L (ref 20.0–31.0)
CREATININE: 0.78 mg/dL (ref 0.55–1.02)
EGFR CKD-EPI (2021) FEMALE: 89 mL/min/1.73m2 (ref >=60)
GLUCOSE RANDOM: 104 mg/dL (ref 70–179)
POTASSIUM: 3.7 mmol/L (ref 3.4–4.8)
SODIUM: 141 mmol/L (ref 135–145)

## 2024-01-13 MED ADMIN — oxyCODONE (ROXICODONE) immediate release tablet 10 mg: 10 mg | ORAL | Stop: 2024-01-16

## 2024-01-13 MED ADMIN — oxyCODONE (ROXICODONE) immediate release tablet 10 mg: 10 mg | ORAL | @ 16:00:00 | Stop: 2024-01-16

## 2024-01-13 MED ADMIN — diphenhydrAMINE (BENADRYL) capsule/tablet 25 mg: 25 mg | ORAL

## 2024-01-13 NOTE — Unmapped (Signed)
 Shift Summary  Severe abdominal pain was managed with PRN oxyCODONE , resulting in significant pain reduction.   Hemoglobin and hematocrit were found to be low on evening labs, and glucose was within normal range.   Wound and IV site assessments revealed clean, dry, and intact dressings with no intervention needed.   Fall prevention and safety interventions were maintained throughout the shift, with no reported incidents.   Overall, the shift was stable with effective pain management and no new complications documented.    Absence of Hospital-Acquired Illness or Injury: No new hospital-acquired injuries were documented during the shift, and the environment remained safe with consistent use of safety interventions and side rails.    Optimal Comfort and Wellbeing: Comfort measures were supported by maintaining a safe environment and providing hygiene care, with the patient independently performing oral care and participating in bathing; nutrition was assessed as adequate.    Absence of Infection Signs and Symptoms: Temperature remained stable throughout the shift and wound dressings on the abdomen and laparoscopic sites were clean, dry, and intact, with no documentation of drainage or odor.    Optimal Pain Control and Function: Abdominal pain was initially reported as severe and constant but improved to zero after administration of oxyCODONE , though the patient declined further intervention later in the shift.    Absence of Fall and Fall-Related Injury: Fall prevention strategies were consistently implemented, including bed alarms, low bed position, nonskid footwear, and hourly visual checks, with no falls or injuries reported.

## 2024-01-13 NOTE — Unmapped (Signed)
 Discharge Summary    Admit date: 01/11/2024    Discharge date and time: 01/14/2024 at 1400    Discharge to: Home    Discharge Service: General Surgery    Discharge Attending Physician: No att. providers found    Discharge Diagnoses: Intraperitoneal hematoma with no active bleeding    Secondary Diagnosis: Active Problems:    * No active hospital problems. *  Resolved Problems:    * No resolved hospital problems. *      OR Procedures: None     Ancillary Procedures: no procedures    Discharge Day Services: Seen and evaluated by the General Surgery and Case Management teams    Subjective   This morning patient states she was doing well and having no complaints. She did inquire about speaking with Case management to set up assistance at home with her packing changes.     Objective   No data found.    I/O this shift:  In: 240 [P.O.:240]  Out: -     Physical Exam:  General: In no acute distress.  Neuro: Awake and alert, answers questions appropriately.  CV: RRR.  Pulm: NWOB on RA.  Abdomen: Soft, central weight but does not appear distended.  Incision: superior aspect of the umbilical incision with wound dehiscence. Packing removed. No active bleeding or drainage. No surrounding erythema or edema. Wound repacked.    Hospital Course:  31F with h/o psoriasis, eczema, DM, HTN, obesity, prior SBO s/p diagnostic laparoscopy with LOA converted to open laparotomy with repair of small bowel perforations x2 on 12/23/23. She presented to her clinic follow-up appointment with general surgery reporting pain and drainage from her incision. She was instructed to present to the ED for further evaluation.     On presentation to the ED, she was afebrile and HDS with Hgb 10.3. CT A/P indicating a large subacute intraperitoneal hematoma extending from left mid abdomen to the deep pelvis. She was admitted for observation to trend hemoglobin, control pain, and perform wound care. Hemoglobin remained stable throughout her hospital stay. The abdominal incision was managed with dry dressing which was transitioned to Maxorb alginate on day of discharge. Home Health was requested per patient request for assistance with wound care. By hospital day #3, she met criteria for discharge. Discharge instructions were explained. Her index surgical team was contacted to arrange for close outpatient follow up. Should Home Health deny the patient, it was ensured at discharge that patient would have an experienced family member assist with dressing changes.    Condition at Discharge: Improved  Discharge Medications:      Medication List      PAUSE taking these medications     blood-glucose meter kit; Wait to take this until your doctor or other   care provider tells you to start again.; Use as instructed   losartan  100 MG tablet; Wait to take this until your doctor or other   care provider tells you to start again.; Commonly known as: COZAAR ; Take 1   tablet (100 mg total) by mouth daily.; What changed: when to take this   spironolactone  25 MG tablet; Wait to take this until your doctor or   other care provider tells you to start again.; Commonly known as:   ALDACTONE ; Take 1 tablet (25 mg total) by mouth daily.; What changed: when   to take this     CHANGE how you take these medications     oxyCODONE  5 MG immediate release tablet; Commonly known as:  ROXICODONE ;   Take 1 tablet (5 mg total) by mouth once daily every three days as needed   during procedure for pain. Take for dressing changes if needed.; What   changed: when to take this     CONTINUE taking these medications     blood-glucose sensor Devi; Use as directed   cetirizine  5 MG tablet; Commonly known as: ZYRTEC    COSENTYX  PEN (2 PENS) 150 mg/mL Pnij injection; Generic drug:   secukinumab ; Inject the contents of 2 pens (300 mg total) under the skin   every twenty-eight (28) days.   empagliflozin  10 mg tablet; Commonly known as: JARDIANCE ; Take 1 tablet   (10 mg total) by mouth daily.   glucose blood test strip; Generic drug: blood sugar diagnostic; Check   blood sugar as directed once a day and for symptoms of high or low blood   sugar.   loratadine 10 mg tablet; Commonly known as: CLARITIN   meloxicam  15 MG tablet; Commonly known as: MOBIC ; TAKE 1 TABLET BY MOUTH   ONCE DAILY AS NEEDED FOR PAIN   MOUNJARO  12.5 mg/0.5 mL Pnij; Generic drug: tirzepatide ; Inject 12.5 mg   under the skin every seven (7) days.   MULTIVITAMIN GUMMIES 200 mcg Chew; Generic drug: multivit with min-folic   acid   ONETOUCH DELICA PLUS LANCET 33 gauge Misc; Generic drug: lancets   simvastatin  20 MG tablet; Commonly known as: ZOCOR ; Take 1 tablet (20 mg   total) by mouth nightly.   topiramate  25 MG tablet; Commonly known as: TOPAMAX ; Take 1 tablet (25   mg total) by mouth nightly.   triamcinolone  0.1 % cream; Commonly known as: KENALOG ; Apply topically   to active, raised areas twice daily as needed.     STOP taking these medications     ASPERFLEX (LIDOCAINE ) 4 % patch; Generic drug: lidocaine    gabapentin  250 mg/5 mL oral solution; Commonly known as: NEURONTIN    HEALTHYLAX 17 gram packet; Generic drug: polyethylene glycol       Pending Test Results: None    Discharge Instructions:    Other Instructions       Discharge instructions      DISCHARGE INSTRUCTIONS  You may eat a normal diet.    Continue activity restrictions as instructed by your index surgeon until 02/02/2024.     INCISION: Look at your incision at least twice a day. Replace the alginate dressing in the wound every three days with the next change being Sunday 01/17/24. Home Health was requested on day of discharge. A strip of the dressing is lightly packed into the area of dehiscence near the umbilicus; leave a small tail so it may easily be pulled out. Can then placed dry gauze over it taped in place.     PAIN MEDICATION: Do not drive or drink alcohol while taking prescription pain medication. Take your prescribed pain medication only as needed. You may decrease the amount of pain medication as tolerated, and take Tylenol  or a Motrin product as directed on the label if have tolerated these medications before and have not been told by another provider not to take them.  Pain medication can cause constipation. To avoid this, you should take an over-the-counter stool softener, such as docusate 100mg  1-2 times daily.     Please follow up with your PCP to discuss your home medications including which you are to be taking and how frequently. Some medications in your chart are listed as being paused however you report taking them.  We do not wish to give you erroneous instructions regarding your medications.       WHEN TO CALL YOUR PRIMARY SURGEON'S OFFICE:     1. Thick, yellow drainage from your incisions, redness at incisions, bleeding, or further separation of wounds.  2. Temperature greater than 101.5  3. Uncontrolled nausea or vomiting.  4. Pain that is not controlled by your pain medication or feels like a gallbladder attack.  5. Jaundice (yellow eyes or skin)    You must follow up with your index surgical team. We contacted them to schedule an appointment. If you do not hear from them, please call their office number [phone]. For emergencies at night or on the week-end, please call [phone]) and ask for the surgery resident on call.          Labs and Other Follow-ups after Discharge:  Follow Up instructions and Outpatient Referrals     Ambulatory Referral to Home Health      Reason for referral: need for dressing changes every 3 days, first change   on Sunday 01/17/24. Small strip of Maxorb alginate dressing lightly packed   into small area of dehiscence in abdominal incision.    Physician to follow patient's care: Referring Provider    Disciplines requested: Nursing    Nursing requested: Other: (please enter in comments) Comment - wound   care, see below    Requested [facility] Date:  Comment - within 48 hours    Discharge instructions          Future Appointments:  Appointments which have been scheduled for you      Jan 19, 2024 3:45 PM  (Arrive by 3:30 PM)  RETURN SPINE with Garnette Ozell Hamilton, NP  [facility] SPINE CTR NEUROSURG [facility] [address] [place]) [address]  [address]  [place] [address]  [phone]        Jan 21, 2024 11:00 AM  (Arrive by 10:45 AM)  RETURN CONTINUITY with Theodoro Just, MD  [facility] [address] [place] [address] [facility] [place]) [address] [place] [address]  [place] [address]  [phone]        Jan 21, 2024 1:00 PM  (Arrive by 12:30 PM)  RETURN  CONSULT with Lauraine Joesph Ancona, FNP  [facility] GENERAL AND ACUTE CARE SURGERY [facility]) [address]  [place] [address]  [phone]        Jan 27, 2024 12:50 PM  (Arrive by 12:35 PM)  Procedure with Fonda Belle Hood, MD  [facility] SPORTS MED CENTER  [facility]) [address]  [place] [address]  [phone]   Leave all valuables at home.         Mar 29, 2024 3:00 PM  (Arrive by 2:45 PM)  RETURN CONTINUITY with Theodoro Just, MD  [facility] FAMILY MEDICINE [address] [place] [address] [facility] [place]) [address]  [address]  [place] [address]  [phone]               I saw and evaluated the patient and discussed the assessment and plan with the resident.  I provided the required supervision for all services billed.  I independently reviewed the image(s) and agree with the interpretation and plan as documented in the resident???s note. VICENTA GORMAN GEE, MD

## 2024-01-13 NOTE — Unmapped (Signed)
 Problem: Adult Inpatient Plan of Care  Goal: Absence of Hospital-Acquired Illness or Injury  Outcome: Progressing  Intervention: Identify and Manage Fall Risk  Recent Flowsheet Documentation  Taken 01/13/2024 0800 by Sophea Rackham, RN  Safety Interventions:   fall reduction program maintained   low bed  Intervention: Prevent Skin Injury  Recent Flowsheet Documentation  Taken 01/13/2024 0800 by Jaimie Pippins, RN  Skin Protection: adhesive use limited  Goal: Optimal Comfort and Wellbeing  Outcome: Progressing  Goal: Readiness for Transition of Care  Outcome: Progressing  Goal: Rounds/Family Conference  Outcome: Progressing     Problem: Latex Allergy  Goal: Absence of Allergy Symptoms  Outcome: Progressing     Problem: Wound  Goal: Optimal Coping  Outcome: Progressing  Goal: Optimal Functional Ability  Outcome: Progressing  Goal: Absence of Infection Signs and Symptoms  Outcome: Progressing  Goal: Improved Oral Intake  Outcome: Progressing  Goal: Optimal Pain Control and Function  Outcome: Progressing  Goal: Skin Health and Integrity  Outcome: Progressing  Intervention: Optimize Skin Protection  Recent Flowsheet Documentation  Taken 01/13/2024 0800 by Tajana Crotteau, RN  Pressure Reduction Techniques: frequent weight shift encouraged  Pressure Reduction Devices: positioning supports utilized  Skin Protection: adhesive use limited  Goal: Optimal Wound Healing  Outcome: Progressing     Problem: Pain Acute  Goal: Optimal Pain Control and Function  Outcome: Progressing     Problem: Fall Injury Risk  Goal: Absence of Fall and Fall-Related Injury  Outcome: Progressing  Intervention: Promote Scientist, clinical (histocompatibility and immunogenetics) Documentation  Taken 01/13/2024 0800 by Stokes Rattigan, RN  Safety Interventions:   fall reduction program maintained   low bed

## 2024-01-13 NOTE — Unmapped (Signed)
 Daily Progress Note        Subjective:    No acute events occurred overnight.  Patient denies any nausea or vomiting.  She reports that her on abdominal pain is very tolerable.  She has not had excessive drainage from her incision.  She does not report fever or chills.  She is now passing flatus.    Objective:    Vital signs in last 24 hours:  Temp:  [35.8 ??C (96.5 ??F)-36.8 ??C (98.3 ??F)] 36.3 ??C (97.4 ??F)  Pulse:  [88-98] 95  Resp:  [12-16] 16  BP: (131-145)/(60-65) 138/64  MAP (mmHg):  [87-93] 92  SpO2:  [97 %-99 %] 97 %    Intake/Output last 3 shifts:  I/O last 3 completed shifts:  In: 120 [P.O.:120]  Out: -   Intake/Output this shift:  I/O this shift:  In: 200 [P.O.:200]  Out: -     Physical Exam:  General appearance: alert, cooperative, and no distress  Lungs: Normal respiratory effort in no acute respiratory distress  Heart: RR  Abdomen: Soft, nondistended but with central weight, umbilical incision with mid aspect wound dehiscence with a 2 cm wound dehiscence with wound bed having beefy red tissue.  There is globular exudate at the superior aspect of the dehiscence but can readily see the wound bed underneath which is healthy.  The exudate is tethered to the dermis.  Psych: normal mood and affect    Lab Results   Component Value Date    WBC 9.3 01/11/2024    HGB 9.4 (L) 01/13/2024    HCT 28.6 (L) 01/13/2024    PLT 572 (H) 01/11/2024       Lab Results   Component Value Date    NA 141 01/13/2024    K 3.7 01/13/2024    CL 101 01/13/2024    CO2 26.4 01/13/2024    BUN 8 (L) 01/13/2024    CREATININE 0.78 01/13/2024    GLU 104 01/13/2024    CALCIUM 9.1 01/13/2024    MG 2.0 12/27/2023    PHOS 2.6 12/27/2023       Lab Results   Component Value Date    BILITOT 0.6 01/11/2024    BILIDIR 0.20 01/11/2024    PROT 7.9 01/11/2024    ALBUMIN 3.3 (L) 01/11/2024    ALT 21 01/11/2024    AST 22 01/11/2024    ALKPHOS 63 01/11/2024       Lab Results   Component Value Date    INR 1.32 01/11/2024         Assessment/Plan:    58 y.o. female PMHx of psoriasis, eczema, DM, HTN, and obesity who presented to the ED on 9/9 with acute onset epigastric pain and emesis. CTA abdomen demonstrating small bowel obstruction with transition point in the right lower quadrant. The patient was taken to the OR on 12/22/2023 for diagnostic laparoscopy with laparoscopic lysis of adhesions converted to open laparotomy with repair of small bowel perforations x2. She was seen in the ED and admitted for concern of abdominal pain and CT findings concerning for intra-peritoneal hematoma, extending from the left mid abdomen to the deep pelvis.      Admitting at this time for observation and monitoring with serial labs for hematoma.   Packing replaced with dry Kerlix and gauze over top.  Vitals are unremarkable.  Her hemoglobin/hematocrit has been stable.  Will give her a diet.  If this is tolerated may consider discharging today.    Neuro: tylenol  prn, oxycodone   prn, morphine  prn     CV: HDS, hydrazaline prn, hold home meds     Pulm: stable on RA. IS. OOB as much as possible.      FEN/GI: Surgical GI diet.      GU: Adequate UOP per patient  *Foley: Voiding spontaneously     Heme/ID: Afebrile.   No leukocytosis..       Endo: stable, Insulin  protocol. Hold home meds     Disposition: Admit to Floor status for observation.    General Surgery on-call can be reached at pager [phone]    Corean Du, PA-C  General Surgery  01/13/2024  4:41 PM     I saw and evaluated the patient and discussed the assessment and plan with the APP.  I provided the required supervision for all services billed.  I independently reviewed the image(s) and agree with the interpretation and plan as documented in the APP's note. VICENTA GORMAN GEE, MD

## 2024-01-14 LAB — BASIC METABOLIC PANEL
ANION GAP: 14 mmol/L (ref 5–14)
BLOOD UREA NITROGEN: 6 mg/dL — ABNORMAL LOW (ref 9–23)
BUN / CREAT RATIO: 8
CALCIUM: 9 mg/dL (ref 8.7–10.4)
CHLORIDE: 102 mmol/L (ref 98–107)
CO2: 27.1 mmol/L (ref 20.0–31.0)
CREATININE: 0.71 mg/dL (ref 0.55–1.02)
EGFR CKD-EPI (2021) FEMALE: >90 mL/min/1.73m2 (ref >=60)
GLUCOSE RANDOM: 117 mg/dL (ref 70–179)
POTASSIUM: 3.9 mmol/L (ref 3.4–4.8)
SODIUM: 143 mmol/L (ref 135–145)

## 2024-01-14 LAB — HEMOGLOBIN AND HEMATOCRIT, BLOOD
HEMATOCRIT: 28.2 % — ABNORMAL LOW (ref 34.0–44.0)
HEMOGLOBIN: 9.5 g/dL — ABNORMAL LOW (ref 11.3–14.9)

## 2024-01-14 MED ORDER — OXYCODONE 5 MG TABLET
ORAL_TABLET | ORAL | 0 refills | 5.00000 days | Status: CP | PRN
Start: 2024-01-14 — End: 2024-01-29
  Filled 2024-01-14: qty 5, 15d supply, fill #0

## 2024-01-14 NOTE — Unmapped (Signed)
 Problem: Adult Inpatient Plan of Care  Goal: Absence of Hospital-Acquired Illness or Injury  01/14/2024 1404 by Juliza Machnik, RN  Outcome: Resolved  01/14/2024 0922 by Novis League, RN  Outcome: Progressing  Intervention: Identify and Manage Fall Risk  Recent Flowsheet Documentation  Taken 01/14/2024 0800 by Janiel Derhammer, RN  Safety Interventions:   fall reduction program maintained   low bed  Intervention: Prevent Skin Injury  Recent Flowsheet Documentation  Taken 01/14/2024 0800 by Lawrence Mitch, RN  Device Skin Pressure Protection: absorbent pad utilized/changed  Skin Protection: adhesive use limited  Goal: Optimal Comfort and Wellbeing  01/14/2024 1404 by Isidore Margraf, RN  Outcome: Resolved  01/14/2024 0922 by Rylan Bernard, RN  Outcome: Progressing  Goal: Readiness for Transition of Care  01/14/2024 1404 by Bethel Sirois, RN  Outcome: Resolved  01/14/2024 0922 by Jayston Trevino, RN  Outcome: Progressing  Goal: Rounds/Family Conference  01/14/2024 1404 by Arch Methot, RN  Outcome: Resolved  01/14/2024 0922 by Briah Nary, RN  Outcome: Progressing     Problem: Latex Allergy  Goal: Absence of Allergy Symptoms  01/14/2024 1404 by Amyriah Buras, RN  Outcome: Resolved  01/14/2024 0922 by Wylie Coon, RN  Outcome: Progressing     Problem: Wound  Goal: Optimal Coping  01/14/2024 1404 by Gale Hulse, RN  Outcome: Resolved  01/14/2024 0922 by Kemia Wendel, RN  Outcome: Progressing  Goal: Optimal Functional Ability  01/14/2024 1404 by Brittney Mucha, RN  Outcome: Resolved  01/14/2024 0922 by Taiven Greenley, RN  Outcome: Progressing  Goal: Absence of Infection Signs and Symptoms  01/14/2024 1404 by Ivyanna Sibert, RN  Outcome: Resolved  01/14/2024 0922 by Laryah Neuser, RN  Outcome: Progressing  Goal: Improved Oral Intake  01/14/2024 1404 by Reann Dobias, RN  Outcome: Resolved  01/14/2024 0922 by Armen Waring, RN  Outcome: Progressing  Goal: Optimal Pain Control and Function  01/14/2024 1404 by Lylee Corrow, RN  Outcome: Resolved  01/14/2024 0922 by Evie Croston, RN  Outcome: Progressing  Goal: Skin Health and Integrity  01/14/2024 1404 by Tyrone Pautsch, RN  Outcome: Resolved  01/14/2024 0922 by Francesca Strome, RN  Outcome: Progressing  Intervention: Optimize Skin Protection  Recent Flowsheet Documentation  Taken 01/14/2024 0800 by Malya Cirillo, RN  Pressure Reduction Techniques: frequent weight shift encouraged  Pressure Reduction Devices: positioning supports utilized  Skin Protection: adhesive use limited  Goal: Optimal Wound Healing  01/14/2024 1404 by Pratik Dalziel, RN  Outcome: Resolved  01/14/2024 0922 by Dave Mannes, RN  Outcome: Progressing     Problem: Pain Acute  Goal: Optimal Pain Control and Function  01/14/2024 1404 by Anet Logsdon, RN  Outcome: Resolved  01/14/2024 0922 by Husna Krone, RN  Outcome: Progressing     Problem: Fall Injury Risk  Goal: Absence of Fall and Fall-Related Injury  01/14/2024 1404 by Nikoleta Dady, RN  Outcome: Resolved  01/14/2024 0922 by Jermisha Hoffart, RN  Outcome: Progressing  Intervention: Promote Injury-Free Environment  Recent Flowsheet Documentation  Taken 01/14/2024 0800 by Sahej Hauswirth, RN  Safety Interventions:   fall reduction program maintained   low bed

## 2024-01-14 NOTE — Unmapped (Signed)
 Problem: Adult Inpatient Plan of Care  Goal: Absence of Hospital-Acquired Illness or Injury  Outcome: Progressing  Intervention: Identify and Manage Fall Risk  Recent Flowsheet Documentation  Taken 01/14/2024 0800 by Cigi Bega, RN  Safety Interventions:   fall reduction program maintained   low bed  Intervention: Prevent Skin Injury  Recent Flowsheet Documentation  Taken 01/14/2024 0800 by Olon Russ, RN  Device Skin Pressure Protection: absorbent pad utilized/changed  Skin Protection: adhesive use limited  Goal: Optimal Comfort and Wellbeing  Outcome: Progressing  Goal: Readiness for Transition of Care  Outcome: Progressing  Goal: Rounds/Family Conference  Outcome: Progressing     Problem: Latex Allergy  Goal: Absence of Allergy Symptoms  Outcome: Progressing     Problem: Wound  Goal: Optimal Coping  Outcome: Progressing  Goal: Optimal Functional Ability  Outcome: Progressing  Goal: Absence of Infection Signs and Symptoms  Outcome: Progressing  Goal: Improved Oral Intake  Outcome: Progressing  Goal: Optimal Pain Control and Function  Outcome: Progressing  Goal: Skin Health and Integrity  Outcome: Progressing  Intervention: Optimize Skin Protection  Recent Flowsheet Documentation  Taken 01/14/2024 0800 by Payne Garske, RN  Pressure Reduction Techniques: frequent weight shift encouraged  Pressure Reduction Devices: positioning supports utilized  Skin Protection: adhesive use limited  Goal: Optimal Wound Healing  Outcome: Progressing     Problem: Pain Acute  Goal: Optimal Pain Control and Function  Outcome: Progressing     Problem: Fall Injury Risk  Goal: Absence of Fall and Fall-Related Injury  Outcome: Progressing  Intervention: Promote Scientist, clinical (histocompatibility and immunogenetics) Documentation  Taken 01/14/2024 0800 by Al Gagen, RN  Safety Interventions:   fall reduction program maintained   low bed

## 2024-01-14 NOTE — Unmapped (Signed)
 Patient described the pain from abdominal incision site as manageable. Tape was applied as reinforcement to the site. Patient slept well throughout the night. No acute changes were reported.    Problem: Adult Inpatient Plan of Care  Goal: Absence of Hospital-Acquired Illness or Injury  Outcome: Ongoing - Unchanged  Intervention: Identify and Manage Fall Risk  Recent Flowsheet Documentation  Taken 01/14/2024 0400 by Nadene Cotta, RN  Safety Interventions:   fall reduction program maintained   lighting adjusted for tasks/safety   nonskid shoes/slippers when out of bed  Taken 01/14/2024 0200 by Shanel Prazak, RN  Safety Interventions:   fall reduction program maintained   lighting adjusted for tasks/safety   nonskid shoes/slippers when out of bed  Taken 01/14/2024 0000 by Nadene Cotta, RN  Safety Interventions:   fall reduction program maintained   lighting adjusted for tasks/safety   nonskid shoes/slippers when out of bed  Taken 01/13/2024 2200 by Tristyn Pharris, RN  Safety Interventions:   fall reduction program maintained   lighting adjusted for tasks/safety   nonskid shoes/slippers when out of bed  Taken 01/13/2024 2000 by Nadene Cotta, RN  Safety Interventions:   fall reduction program maintained   lighting adjusted for tasks/safety   nonskid shoes/slippers when out of bed  Intervention: Prevent Skin Injury  Recent Flowsheet Documentation  Taken 01/13/2024 2200 by Nadene Cotta, RN  Positioning for Skin: Supine/Back  Taken 01/13/2024 2000 by Nadene Cotta, RN  Positioning for Skin: Supine/Back  Goal: Optimal Comfort and Wellbeing  Outcome: Ongoing - Unchanged  Goal: Readiness for Transition of Care  Outcome: Ongoing - Unchanged  Goal: Rounds/Family Conference  Outcome: Ongoing - Unchanged     Problem: Latex Allergy  Goal: Absence of Allergy Symptoms  Outcome: Ongoing - Unchanged     Problem: Wound  Goal: Optimal Coping  Outcome: Ongoing - Unchanged  Goal: Optimal Functional Ability  Outcome: Ongoing - Unchanged  Intervention: Optimize Functional Ability  Recent Flowsheet Documentation  Taken 01/13/2024 2000 by Bobetta Korf, RN  Activity Management: up ad lib  Goal: Absence of Infection Signs and Symptoms  Outcome: Ongoing - Unchanged  Goal: Improved Oral Intake  Outcome: Ongoing - Unchanged  Goal: Optimal Pain Control and Function  Outcome: Ongoing - Unchanged  Goal: Skin Health and Integrity  Outcome: Ongoing - Unchanged  Intervention: Optimize Skin Protection  Recent Flowsheet Documentation  Taken 01/13/2024 2200 by Nnamdi Dacus, RN  Pressure Reduction Techniques: frequent weight shift encouraged  Taken 01/13/2024 2000 by [employer], Allene Furuya, RN  Activity Management: up ad lib  Pressure Reduction Techniques: frequent weight shift encouraged  Goal: Optimal Wound Healing  Outcome: Ongoing - Unchanged     Problem: Pain Acute  Goal: Optimal Pain Control and Function  Outcome: Ongoing - Unchanged     Problem: Fall Injury Risk  Goal: Absence of Fall and Fall-Related Injury  Outcome: Ongoing - Unchanged  Intervention: Promote Scientist, clinical (histocompatibility and immunogenetics) Documentation  Taken 01/14/2024 0400 by Nadene Cotta, RN  Safety Interventions:   fall reduction program maintained   lighting adjusted for tasks/safety   nonskid shoes/slippers when out of bed  Taken 01/14/2024 0200 by Anival Pasha, RN  Safety Interventions:   fall reduction program maintained   lighting adjusted for tasks/safety   nonskid shoes/slippers when out of bed  Taken 01/14/2024 0000 by Nadene Cotta, RN  Safety Interventions:   fall reduction program maintained   lighting adjusted for tasks/safety   nonskid shoes/slippers when out of bed  Taken 01/13/2024 2200 by  Laurieanne Galloway, RN  Safety Interventions:   fall reduction program maintained   lighting adjusted for tasks/safety   nonskid shoes/slippers when out of bed  Taken 01/13/2024 2000 by [employer], Mavery Milling, RN  Safety Interventions:   fall reduction program maintained   lighting adjusted for tasks/safety   nonskid shoes/slippers when out of bed

## 2024-01-14 NOTE — Unmapped (Signed)
 Received faxed form from [employer]. Pt will need appointment to have forms filled out by PCP. Message routed to front desk for scheduling.

## 2024-01-15 NOTE — Unmapped (Signed)
 Home health has been set up for through the agency listed below. The Home health agency will be contacting you to set up a time for them to come see you in your home within 2 days of your discharge.  If you have not heard from them prior to 01/17/24 or you have any questions about home health, please contact them at the phone number listed below.    Home Care [facility]  - Discharged on 01/14/2024 Admission date: 01/11/2024 - Discharge disposition: Home with Self Care      Service Provider Services Address Phone Fax Patient Preferred    [facility] [address], [place] [address] [phone] --

## 2024-01-18 NOTE — Unmapped (Signed)
 Received a call from Jasmine with [facility] home [facility] just letting us  know that there will be a delay in the start of care per pt request. Care will be starting tomorrow 01/19/24.

## 2024-01-20 ENCOUNTER — Ambulatory Visit (INDEPENDENT_AMBULATORY_CARE_PROVIDER_SITE_OTHER)

## 2024-01-20 DIAGNOSIS — Z872 Personal history of diseases of the skin and subcutaneous tissue: Secondary | ICD-10-CM | POA: Diagnosis not present

## 2024-01-20 DIAGNOSIS — L603 Nail dystrophy: Secondary | ICD-10-CM | POA: Diagnosis not present

## 2024-01-20 NOTE — Progress Notes (Signed)
 Subjective:  Patient ID: Julia Snow, female    DOB: 11-19-1965,  MRN: [id number]  Chief Complaint  Patient presents with   Toe Pain    toe left big pain. Ingrown nail removal done in Aug 2025    58 y.o. female presents with the above complaint.  Patient had a left hallux partial nail avulsion performed in August 2025 that has done very well.  She has noticed that as it has grown out, she has noticed some pain to the distal aspect of the nail.  The borders do not have any pain or signs of ingrowing.  Review of Systems: Negative except as noted in the HPI. Denies N/V/F/Ch.  Past Medical History:  Diagnosis Date   Allergy    Hypertension    Miscarriage 2005   Sleep apnea    does not wear CPAP    Current Outpatient Medications:    amLODipine  (NORVASC ) 10 MG tablet, Take 1 tablet (10 mg total) by mouth daily., Disp: 90 tablet, Rfl: 1   Biotin 10000 MCG TABS, Take by mouth., Disp: , Rfl:    cetirizine (ZYRTEC) 5 MG tablet, Take by mouth., Disp: , Rfl:    ciclopirox  (PENLAC ) 8 % solution, Apply topically at bedtime. Apply over nail and surrounding skin. Apply daily over previous coat. After seven (7) days, may remove with alcohol and continue cycle., Disp: 6.6 mL, Rfl: 2   clobetasol (TEMOVATE) 0.05 % external solution, Apply topically to scalp twice daily., Disp: , Rfl:    diclofenac Sodium (VOLTAREN) 1 % GEL, Apply 1 g topically., Disp: , Rfl:    ferrous sulfate 325 (65 FE) MG EC tablet, Take 325 mg by mouth 3 (three) times daily with meals., Disp: , Rfl:    fexofenadine (ALLEGRA) 60 MG tablet, Take by mouth., Disp: , Rfl:    gabapentin  (NEURONTIN ) 300 MG capsule, Take 2 capsules (600 mg total) by mouth daily as needed (shingles pain)., Disp: 30 capsule, Rfl: 1   hydrochlorothiazide  (HYDRODIURIL ) 25 MG tablet, Take 1 tablet (25 mg total) by mouth daily., Disp: 90 tablet, Rfl: 1   JARDIANCE 10 MG TABS tablet, Take 10 mg by mouth daily., Disp: , Rfl:    ketoconazole (NIZORAL) 2 %  cream, Apply 1 Application topically 2 (two) times daily., Disp: , Rfl:    loratadine (CLARITIN) 10 MG tablet, Take 10 mg by mouth., Disp: , Rfl:    losartan  (COZAAR ) 100 MG tablet, Take 1 tablet (100 mg total) by mouth daily., Disp: 90 tablet, Rfl: 1   losartan -hydrochlorothiazide  (HYZAAR) 100-25 MG tablet, Take 1 tablet by mouth daily., Disp: , Rfl:    meloxicam (MOBIC) 15 MG tablet, Take 15 mg by mouth., Disp: , Rfl:    metFORMIN  (GLUCOPHAGE -XR) 500 MG 24 hr tablet, Take 500 mg by mouth 3 (three) times daily., Disp: , Rfl:    methocarbamol (ROBAXIN) 750 MG tablet, Take 750 mg by mouth., Disp: , Rfl:    MOUNJARO 10 MG/0.5ML Pen, Inject into the skin., Disp: , Rfl:    Multiple Vitamin (MULTIVITAMIN) tablet, Take 1 tablet by mouth daily., Disp: , Rfl:    mupirocin ointment (BACTROBAN) 2 %, APPLY OINTMENT TOPICALLY TO AFFECTED AREA TWICE DAILY FOR 7 DAYS, Disp: , Rfl:    Secukinumab 150 MG/ML SOAJ, INJECT THE CONTENTS OF 2 PENS (300 MG) ONCE WEEKLY AT WEEKS 0, 1, 2, 3, AND 4. THEN INJECT 2 PENS (300 MG) EVERY 4 WEEKS, Disp: , Rfl:    Semaglutide,0.25 or 0.5MG /DOS, (OZEMPIC, 0.25  OR 0.5 MG/DOSE,) 2 MG/1.5ML SOPN, , Disp: , Rfl:    Semaglutide-Weight Management (WEGOVY) 1 MG/0.5ML SOAJ, Inject into the skin., Disp: , Rfl:    simvastatin  (ZOCOR ) 20 MG tablet, Take 1 tablet (20 mg total) by mouth at bedtime., Disp: 90 tablet, Rfl: 1   spironolactone (ALDACTONE) 25 MG tablet, Take 1 tablet by mouth daily., Disp: , Rfl:    Tirzepatide (MOUNJARO Amelia), , Disp: , Rfl:    topiramate (TOPAMAX) 25 MG tablet, Take 50 mg by mouth., Disp: , Rfl:    triamcinolone  cream (KENALOG ) 0.1 %, Apply topically 2 (two) times daily as needed., Disp: , Rfl:    triamcinolone  cream (KENALOG ) 0.5 %, Apply 1 application topically 3 (three) times daily., Disp: 454 g, Rfl: 5   vitamin E 1000 UNIT capsule, Take 1,000 Units by mouth daily., Disp: , Rfl:   Social History   Tobacco Use  Smoking Status Never  Smokeless Tobacco  Never    Allergies  Allergen Reactions   Lisinopril Cough   Penicillins    Latex Rash    Other reaction(s): UNKNOWN Other reaction(s): UNKNOWN Other reaction(s): UNKNOWN    Objective:  There were no vitals filed for this visit. There is no height or weight on file to calculate BMI. Constitutional Well developed. Well nourished. Oriented to person, place, and time.  Vascular Dorsalis pedis pulses palpable bilaterally. Posterior tibial pulses palpable bilaterally. Capillary refill normal to all digits.  No cyanosis or clubbing noted. Pedal hair growth normal.  Neurologic Normal speech. Epicritic sensation to light touch grossly present bilaterally. Negative tinel sign at tarsal tunnel bilaterally.   Dermatologic Skin texture and turgor are within normal limits.  No open wounds. No skin lesions. Hallux nails bilaterally show evidence of healed partial nail avulsion without evidence of any ingrowing.  At the distal aspect of the nail, the nail is impinging onto the tuft of the toe.  Left worse than right.  No open wound, erythema, edema, any signs of infection.  Musculoskeletal: 5 out of 5 muscle strength all major pedal muscle groups, no contributing deformity.     Assessment:   1. Nail dystrophy   2. History of ingrown nail    Plan:  - Patient was evaluated and treated and all questions answered.  Impinging nail, bilateral hallux - Discussed with the patient that she does not have a true ingrown nail anymore on the medial or the lateral border.  Both of these areas feel good to her and look appropriate. -Patient does have the distal aspect of her nail impinging on the underlying nailbed.  I did discuss with her that if her nail is cut a little bit shorter, this impinging would likely not happen.  Today, I debrided bilateral hallux nails of length and thickness without incident.  Following this debridement, patient states that the pain had resolved.  She will continue to  manage this independently. - Return to clinic as needed  No follow-ups on file.  Prentice Ovens, DPM AACFAS Fellowship Trained Podiatric Surgeon [facility]

## 2024-01-21 ENCOUNTER — Ambulatory Visit: Admit: 2024-01-21 | Discharge: 2024-01-21 | Payer: BLUE CROSS/BLUE SHIELD

## 2024-01-21 DIAGNOSIS — Z9889 Other specified postprocedural states: Principal | ICD-10-CM

## 2024-01-21 DIAGNOSIS — M545 Chronic bilateral low back pain without sciatica: Principal | ICD-10-CM

## 2024-01-21 DIAGNOSIS — G8929 Other chronic pain: Principal | ICD-10-CM

## 2024-01-21 DIAGNOSIS — M4316 Spondylolisthesis, lumbar region: Principal | ICD-10-CM

## 2024-01-21 DIAGNOSIS — M47816 Spondylosis without myelopathy or radiculopathy, lumbar region: Principal | ICD-10-CM

## 2024-01-21 DIAGNOSIS — N950 Postmenopausal bleeding: Principal | ICD-10-CM

## 2024-01-21 MED ORDER — LORAZEPAM 0.5 MG TABLET
ORAL_TABLET | Freq: Once | ORAL | 0 refills | 0.00000 days | Status: CP | PRN
Start: 2024-01-21 — End: ?

## 2024-01-21 NOTE — Unmapped (Signed)
 [facility] ACUTE CARE AND GENERAL SURGERY CLINIC NOTE          Patient Name: Tracy Robles  Medical Record Number: [id number]  Date of Service: 01/21/2024    Reason for Visit: Follow up s/p SBO, ex lap, readmission     History of Present Illness    Tracy Robles 58 y.o. female PMHx of psoriasis, eczema, DM, HTN, and obesity who presented to the ED on 9/9 with acute onset epigastric pain and emesis. CTA abdomen demonstrating small bowel obstruction with transition point in the right lower quadrant. The patient was taken to the OR on 12/23/2023 for diagnostic laparoscopy with laparoscopic lysis of adhesions converted to open laparotomy with repair of small bowel perforations x2. Operative findings were adhesive band from the uterus adhered to the small bowel causing small bowel obstruction with resultant upstream dilation of bowel; two small bowel perforations noted in the jejunum requiring repair; no bowel resection needed.  She tolerated the procedure well, was extubated in the OR, and received routine post-operative care before being transferred to the floor. Her diet was advanced and by discharge she was tolerating a regular diet. She was voiding adequately, ambulating independently, and her pain was controlled with PO pain medications. She was discharged home on POD #6 in good condition.     She see seen again in clinic on 9/29 with complaints of breathlessness, incisional leakage, and severe abdominal pain. She was advised to present to the ER. She presented to the ED at [facility] and was admitted there. CT revealed no PE, but did show large hematoma. She was monitored with serial CBC, abdominal exams and there was some concern for potential developing fistula.    Patient returns to clinic today reporting she feels similiarly to the last time I saw her. Patient denies any fever. She endorses some foul smelling drainage from the area inside her umbilicus that is yellow in color. She notes that she has had some nausea, but no vomiting. She is breathing better and no longer sleeping in the recliner. She still has moderate pain, although somewhat improved from our last visit. She also has some abdomina itching especially around areas where there was adhesive. Tolerating a regular diet.  Felt like bowel movements were better with miralax , but she is out. Home health is coming out to see her now, which she appreciated. 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 She has FMLA until the 29th and needs that paperwork filled out.       Review of Systems:  Negative except otherwise noted in the HPI.    Physical Exam:  Vitals:    01/21/24 1303   BP: 163/77   Pulse: 112   Temp: 37.1 ??C (98.7 ??F)   SpO2: 97%       General: awake, alert, afebrile, in NAD  Neuro: Alert and oriented ??3  Lungs: Normal work of breathing on room air  Abdomen: soft, obese non tender, non distended. Incision is mostly C/D/I with no erythema, edema or ecchymosis noted. Some fibrinous exudate along superior aspect of umbilical incision which was mildly dehissed, much improved from prior. No incisional drainage or incisional hernia noted.    Assessment & Plan      Tracy Robles 58 y.o. female PMHx of morbid obesity, psoriasis, eczema, DM, HTN, and obesity who presented to the ED on 9/9 with SBO and underwent ex lap with repair of small bowel perforations  x2. She was subsequently readmitted at [facility] for postoperative hematoma.     On exam no signs or symptoms of infection is noted and patient is healing well. Patient seen and examined with Dr. Aleen due to concern for potential fistula, which we found no evidence of. Discussed with patient that the depth of the wound actually appears to be shallow and that most packing with just sitting in her belly button, which is actually what is deep.  Discussed she likely does not need to back but can just keep area clean, dry and covered. We applied silver nitrate today to the fibrinous area. Discussed not to shower for 24 hours to allow for contact time.     Will plan to see her back in about 10 days for reassessment. Discussed signs and symptoms of infection to monitor for and to let us  know if these develop.     If any new questions or concerns should arise to give the clinic a call, otherwise continue follow-up with their primary care physician.      Patient was agreeable to plan

## 2024-01-21 NOTE — Unmapped (Addendum)
 Medications: Take over-the-counter medications as needed and as tolerated  Take medications as precribed by your Medical Team  Activity: Activities as tolerated.    Conservative Care: Continue Physical Therapy and/or Home Exercises as doing.  Surgical Care: Let's continue to reserve surgical care as a last resort.  Imaging studies: MRI of the Lumbosacral spine was ordered. Medical necessity: Radiographs have been performed/ordered. Failed supervised conservative treatments.  We have prescribed Ativan for claustrophobia during MRI.  Take 1 pill 45 minutes before MRI, 2nd pill available during MRI if needed.  You will need to have someone drive you to and from the MRI when taking this medication.  Labs: None  Return for Next scheduled follow up after MRI with PM&R for injection consult.     Contact our nursing team via MyChart or telephone with any clinical questions/concerns.  Our scheduling team and support staff can be reached at [phone].

## 2024-01-21 NOTE — Unmapped (Signed)
 ORTHOPAEDIC SPINE CLINIC NOTE       Garnette HERO. Glendia, DNP  Nurse Practitioner  [email or web address]  [phone]        Patient Name:Tracy Robles  MRN: [id number]  DOB: 1965-07-23    Date: 01/21/2024    PCP: Servando Hire, MD    ASSESSMENT:     1. Chronic bilateral low back pain without sciatica    2. Spondylolisthesis at L4-L5 level    3. Lumbar facet arthropathy        Tracy Robles is a 58 y.o. female with chronic R>L axial low back pain.  Known L3-L4 and L4-L5 spondylolisthesis.  Physical exam suggestive of primarily right lumbar facet joint pain (+ right facet loading test).    PROM:  None     PLAN and RECOMMENDATIONS:     Options reviewed with patient.  Given that symptoms have been refractory to physical therapy, I recommended a lumbar MRI to further evaluate as the patient is interested in spine injections.  Will have the patient follow-up with PM&R after MRI for injection consultation.    Future Considerations:  -Lumbar injs    Patient Instructions   Medications: Take over-the-counter medications as needed and as tolerated  Take medications as precribed by your Medical Team  Activity: Activities as tolerated.    Conservative Care: Continue Physical Therapy and/or Home Exercises as doing.  Surgical Care: Let's continue to reserve surgical care as a last resort.  Imaging studies: MRI of the Lumbosacral spine was ordered. Medical necessity: Radiographs have been performed/ordered. Failed supervised conservative treatments.  We have prescribed Ativan for claustrophobia during MRI.  Take 1 pill 45 minutes before MRI, 2nd pill available during MRI if needed.  You will need to have someone drive you to and from the MRI when taking this medication.  Labs: None  Return for Next scheduled follow up after MRI with PM&R for injection consult.     Contact our nursing team via MyChart or telephone with any clinical questions/concerns.  Our scheduling team and support staff can be reached at [phone].     Orders Placed This Encounter   Procedures    MRI Lumbar Spine Wo Contrast     Medications Prescribed Today               LORazepam (ATIVAN) 0.5 MG tablet Take 1 tablet (0.5 mg total) by mouth once as needed for other (claustrophobia during MRI) for up to 1 dose. Take 1 pill 45 minutes before MRI, 2nd pill available during MRI if needed.               SUBJECTIVE:     Chief Complaint:  Chief Complaint   Patient presents with    Follow-up     No new onset symptoms at this time      History of Present Illness:        01/21/24 1435   PainSc: 5      Tracy Robles is a 58 y.o. female with a relevant PMH of DM, HTN, psoriasis seen in consultation at the request of Servando Hire, MD for evaluation of low back pain. Date of onset: since chronic.  Modifying factors: worse with sitting  Associated symptoms: stiffness    Established Patient Interval History (01/21/2024):  Patient last seen 04/21/2023.  Today, patient reports symptoms are unchanged.  Patient reports that she recent completed physical therapy.  She reports that physical therapy did provide some relief of her low back pain.  She does report continued bilateral axial low back pain, right worse than the left.  She denies any radicular pain/paresthesia.  She presents today to discuss next steps for management of her low back pain.    Recent accidents/injuries/falls? no  Recent Relevant ED/Urgent Care Encounters? yes ED 01/11/24 for a intraperitoneal hematoma with no active bleeding.  New or worsening neurologic symptoms?  No    Functional limitations include: pain with sitting for prolonged periods    Current Treatments Previous Treatments   Current Relevant Pain Medications:  NSAID: Meloxicam  15mg  PRN  AED: topiramate  25mg  nightly  Opioid: Oxycodone  5mg  PRN    Current Physical Therapy: Current Home Exercise Program and PT Recently completed    Adjunct Treatments: Activity Modification    In a Pain Clinic: No Prior Relevant Pain Medications:  NSAID: Aleve  AED: Gabapentin  600mg  BID    Prior Physical Therapy: Yes.  - PT 03/2023-07/2023  - PT 03/2021 for low back    Injections:None.    Prior Relevant Surgeries: None.        Medical History   She  has a past medical history of Allergic (Don't know), Arthritis (2011), BMI 60.0-69.9, adult (CMS-HCC) (05/29/2021), CTS (carpal tunnel syndrome), Diabetes mellitus (CMS-HCC), Eczema, High blood pressure, Neuromuscular disorder (CMS-HCC), and Psoriasis.     Surgical History   She  has a past surgical history that includes pr lap,diagnostic abdomen (N/A, 12/22/2023); pr lap, surg enterolysis (N/A, 12/22/2023); pr enterotomy,non-duod,explor/bx/fb removal (N/A, 12/22/2023); pr exploratory of abdomen (N/A, 12/22/2023); and Other surgical history (1995).     Allergies   Lisinopril, Penicillins, and Latex   Medications   She has a current medication list which includes the following prescription(s): glucose blood, blood-glucose sensor, cetirizine , diclofenac sodium, empagliflozin , loratadine, meloxicam , multivitamin gummies, onetouch delica plus lancet, oxycodone , cosentyx  pen (2 pens), simvastatin , mounjaro , topiramate , triamcinolone , [Paused] blood-glucose meter, lorazepam, [Paused] losartan , and [Paused] spironolactone .   Review of Systems A 10-system review was performed by questionnaire and noted in the electronic chart.  Positives noted/discussed.  Balance of systems was negative.    Fever/chills: denies  Recent unexplained weight loss: denies  Bowel/bladder symptoms: denies   Family History Her family history includes Alzheimer's disease (age of onset: 42 - 48) in her mother; Arthritis in her mother; Asthma in her father; Breast cancer in her maternal aunt; Clotting disorder in her father; Dementia in her mother; Diabetes in her father and sister; Hypertension in her father and mother; Kidney disease in her father; Miscarriages / Stillbirths in her maternal grandmother and sister; Stroke in her maternal grandmother. Social History She  reports that she has never smoked. She has been exposed to tobacco smoke. She has never used smokeless tobacco. She reports current alcohol use of about 1.0 standard drink of alcohol per week. She reports that she does not use drugs.        Occupational History    Not on file        OBJECTIVE:     PHYSICAL EXAM:  Vitals: Temp 36.5 ??C (97.7 ??F) (Temporal)  - Ht 160 cm (5' 3)  - Wt (!) 121.9 kg (268 lb 12.8 oz)  - BMI 47.62 kg/m??   Appearance: well-nourished and no acute distress   Skin: No cyanosis or clubbing in bilat hands.  Mental Status: Tracy Robles is oriented to time, place, and person & is alert and cooperative  Motor: Normal bulk and tone without evidence of pronator drift or fasciculations. No abnormal movements.  Gait: normal.  Strength: Grossly intact bilateral lower extremities  Sensory: Sensation to light touch intact throughout.  Spine Inspection: normal skin, no defects  Spine Palpation: diffusely tender to palpation.  Facet loading: mildly positive bilaterally.         MEDICAL DECISION MAKING    Test Results:  Imaging:   CT Abdomen & Pelvis. [place]. Date:12/2023.  Impression: No acute findings.  Unchanged grade 2 anterolisthesis L4 on L5, unchanged anterolisthesis L3 on L4.  Lower lumbar facet arthropathy.    L-spine XR. [place]. Date:01/2023.  Impression: Grade 2 anterolisthesis L4 on L5.  Grade 1 anterolisthesis of L3 on L4. Mild left lateral listhesis of L3 on L4.    SI Joint XR. [place]. Date:11/2022.  Impression: Mild bilateral SI joint OA.    L-spine XR. [place]. Date:11/2018.  Impression: Grade 2 anterolisthesis L4-L5      I, Garnette Hamilton, NP, personally interpreted the images. Images were reviewed with the patient on the PACS monitor. The available reports were reviewed.    Labs:  Lab Results   Component Value Date    A1C 5.4 11/26/2023       Discussion:  Clinical findings, diagnostic/treatment options, and plan were discussed with the patient.  Activities - Advised activities as tolerated, using pain as a guide.  Conservative Care - Options discussed.  Imaging - Discussed pros/cons of advanced imaging options.      E&M Coding:    MEDICAL DECISION MAKING (level of service defined by 2/3 elements)     Number/Complexity of Problems Addressed 1 stable chronic illness (99203/99213)   Amount/Complexity of Data to be Reviewed/Analyzed Independent interpretation of a test performed by another physician/other qualified health care professional (99204/99214)   Risk of Complications/Morbidity/Mortality of Management Prescription Medication (99204/99214)     Or TIME     Total Time for E/M Services on the Date of Encounter N/A          cc: Servando Hire, MD, Servando Hire, MD

## 2024-01-27 ENCOUNTER — Ambulatory Visit
Admit: 2024-01-27 | Discharge: 2024-01-28 | Payer: BLUE CROSS/BLUE SHIELD | Attending: Student in an Organized Health Care Education/Training Program | Primary: Student in an Organized Health Care Education/Training Program

## 2024-01-27 DIAGNOSIS — G8929 Other chronic pain: Principal | ICD-10-CM

## 2024-01-27 DIAGNOSIS — M25532 Pain in left wrist: Principal | ICD-10-CM

## 2024-01-27 DIAGNOSIS — M654 Radial styloid tenosynovitis [de Quervain]: Principal | ICD-10-CM

## 2024-01-27 MED ADMIN — triamcinolone acetonide (KENALOG) injection 10 mg: 10 mg | INTRA_ARTICULAR | @ 17:00:00 | Stop: 2024-01-27

## 2024-01-27 NOTE — Unmapped (Signed)
 SPORTS MEDICINE RETURN VISIT    ASSESSMENT AND PLAN      Diagnosis ICD-10-CM Associated Orders   1. De Quervain's tenosynovitis, left  M65.4 Tendon Shealth Injection: left first dorsal extensor      2. Chronic pain of left wrist  M25.532     G89.29            Left wrist pain - Symptomatic first dorsal compartment tenosynovitis, temporarily improved following landmark-guided glucocorticoid injection at an outside office 06/12/2023, recently experiencing a symptomatic exacerbation.  She would like to avoid operative intervention if possible, and is interested in repeat injection today.  Complicated on a background of multiple hand/wrist pathology, though historically largely manageable with conservative measures.   --Imaging findings, further diagnostic and therapeutic options were reviewed with the patient, along with the benefits and drawbacks of each, and the patient expressed understanding  --Patient consented for ultrasound-guided glucocorticoid injection to the left first dorsal compartment tendon sheath as documented below  --Continue physical therapy exercises  --Over-the-counter analgesics as needed  --Continue activity as tolerated    Return if symptoms worsen or fail to improve.    Procedure(s):  Consent  After discussing the various treatment options for the condition, it was agreed that glucocorticoid injection would be the next step in treatment. The nature of and the indications for injection of glucocorticoid and local anaesthetic were reviewed in detail with the patient today. The inherent risks of injection, including infection, bleeding, allergic reaction, increased pain, incomplete relief or temporary relief of symptoms, alterations of blood glucose levels requiring careful monitoring and treatment as indicated, tendon, ligament or articular cartilage rupture or degeneration, nerve injury, skin depigmentation, and/or fatty atrophy were discussed as relevant. The patient expressed understanding and consented to proceed.  Procedure   After the risks and benefits of the procedure were explained, verbal consent was given, and a procedural time-out was performed.  Anatomic landmarks were palpated to identify the approach. The relevant anatomy was then visualized with ultrasound.  The site for the injection was properly marked and prepped with chlorhexidine solution.   The injection site was anesthetized with ethyl chloride and 1% lidocaine .   Using ultrasound guidance, the left first dorsal compartment was visualized and injected with 10 milligrams of Kenalog  (triamcinolone  acetonide) and 1 milliliter of 1% lidocaine  using a sterile technique.   During the injection, there was unrestricted flow and care was taken not to inject glucocorticoid into the skin or subcutaneous tissues.  There were no complications during the procedure.  Post-procedure  A sterile adhesive bandage was applied.   Instructions were given regarding post-injection care, when to follow up in clinic and what to expect from the procedure.   The patient tolerated the procedure well and was discharged without immediate complication.    NEED FOR SONOGRAPHIC GUIDANCE  Given the complexity of this problem and the anatomic location of this structure, sonographic guidance is recommended to prevent injury to neurovascular structures and confirm accuracy of injection. The accuracy of doing these injections blind is poor and the benefit to the patient by using ultrasound guidance is significant to avoid complications.     Reference:  American Medical Society for Sports Medicine (AMSSM) position statement: interventional musculoskeletal ultrasound in sports medicine.  Garnetta ALEC Hurst MM, Adams E, Berkoff D, Concoff AL, [place] LELON Claudene DOROTHA Mallissa J Sports Med. 2014 Oct 20. pii: [id number]. doi: [id number]      SUBJECTIVE     Chief Complaint:   Chief Complaint  Patient presents with    Left Wrist - Pain       History of Present Illness: 58 y.o. female who presents for left wrist pain.  She has a history of several wrist issues, having seen Dr. Jakie and Dr. Dreama in the past.  She also has a history of well-controlled diabetes, most recent hemoglobin A1c 5.4 on 11/26/2023.  She last saw Dr. Jakie 12/04/2023 and was referred here today for additional evaluation and management.  She was scheduled to see me last month, but unfortunately was hospitalized at the time of that appointment, requiring postponement to today.  She presents today reporting ongoing discomfort at the left radial wrist.  She received a landmark-guided glucocorticoid injection for similar symptoms 06/04/2023 at [facility] which did result in some symptomatic improvement for a while.  Unfortunately, she has recently been experiencing recurrence of symptoms.  She would like to avoid operative intervention if possible.  She denies fevers, chills or other complications from prior injections.    Past Medical History: Past Medical History[1]      OBJECTIVE     Physical Exam:  Vitals:   Wt Readings from Last 3 Encounters:   01/21/24 (!) 121.9 kg (268 lb 12.8 oz)   01/21/24 (!) 121.8 kg (268 lb 8 oz)   01/21/24 (!) 121.7 kg (268 lb 6.4 oz)     Estimated body mass index is 47.62 kg/m?? as calculated from the following:    Height as of 01/21/24: 160 cm (5' 3).    Weight as of 01/21/24: 121.9 kg (268 lb 12.8 oz).  Gen: Well-appearing female in no acute distress  MSK: LEFT WRIST -   No erythema, swelling or deformity.  TTP over the radial wrist.  Normal alignment and full range of motion.  5/5 strength.  Positive Finkelstein.  Neurovascularly intact.    Imaging/other tests: Limited point-of-care sonographic evaluation of the left first dorsal compartment demonstrates evidence of fluid accumulation within the common extensor tendon sheath consistent with a diagnosis of de Quervain tenosynovitis.      ADMINISTRATIVE     I have personally reviewed and interpreted the images (as available).  Point-of-care ultrasound imaging is on file and stored in a permanent location (if performed).  I have personally reviewed prior records and incorporated relevant information above (as available).    MEDICAL DECISION MAKING (level of service defined by 2/3 elements)     Number/Complexity of Problems Addressed 1 undiagnosed new problem with uncertain prognosis (99204/99214)   Amount/Complexity of Data to be Reviewed/Analyzed 3 points: Review prior notes (1 point per unique source); Review test results (1 point per unique test); Order tests (1 point per unique test); Assessment requiring an independent historian (99204/99214)   Risk of Complications/Morbidity/Mortality of Management Over-the-counter Medications (99203/99213)  Physical Therapy/Occupational Therapy (99203/99213)  Decision for MINOR Surgery (Including Injection) WITHOUT Risk Factors (99203/99213)     TIME     Total Time for E/M Services on the Date of Encounter Time-based coding not utilized for this encounter     CONSULTATION     Consultation services provided No     MODIFIER 25 (Significant, Separately Billable Evaluation and Management)     Documentation to ensure appropriate insurance payment for medically necessary work    Per the [employer] (rev. 04/14/2021) Chapter 13, Section B. Evaluation & Management (E&M) Services, paragraph 5:   ???In general, E&M services on the same date of service as the minor surgical procedure  are included in the payment for the procedure???However, a significant and separately identifiable E&M service unrelated to the decision to perform the minor surgical procedure is separately reportable with modifier 25. The E&M service and minor surgical procedure do not require different diagnoses.???    Per the American Medical Association in Reporting CPT Modifier 25 [CPT??Geophysicist/field seismologist (Online). 2023;33(11):1-12.] Page 1, Appropriate Use, paragraph 1:   ???Modifier 25 is used to indicate that a patient's condition required a significant, separately identifiable evaluation and management (E/M) service above and beyond that associated with another procedure or service being reported by the same physician or other qualified health care professional [facility]) on the same date. This service must be above and beyond the other service provided or beyond the usual preoperative and postoperative care associated with the procedure or service that was performed on that same date, and it must be substantiated by documentation in the patient's record that satisfies the relevant criteria for the respective E/M service to be reported.???    Per the American Medical Association in Reporting CPT Modifier 25 [CPT??Geophysicist/field seismologist (Online). 2023;33(11):1-12.] Page 2, Considerations, bullet point 2 (Requires awareness of usual preoperative and postoperative services):   ???Pre- and post-operative services typically associated with a procedure include the following and cannot be reported with a separate E/M services code:   Review of patient's relevant past medical history,  Assessment of the problem area to be treated by surgical or other service,  Formulation and explanation of the clinical diagnosis,  Review and explanation of the procedure to the patient, family, or caregiver,  Discussion of alternative treatments or diagnostic options,  Obtaining informed consent,  Providing postoperative care instructions,  Discussion of any further treatment and follow up after the procedure???    As the service provider for this encounter, I attest that the patient's condition required a significant, separately identifiable, medical necessary evaluation and management service in addition to the procedure performed on the same date of service. The evaluation and management service was above and beyond the usual preoperative and postoperative care associated with the procedure. The specific elements of the encounter that represent evaluation and management service above and beyond the usual preoperative and postoperative care associated with the procedure include, but are not limited to:  Reviewed the patient's relevant past surgical history, social history and family history  Reviewed the patient's medications  Reviewed the patient's allergies  Independently obtained history of present illness and relevant review of systems  Independently reviewed laboratory, imaging and/or other data  Independently interpreted laboratory, imaging and/or other data  Decided to order/obtain additional laboratory, imaging or other data  Independently synthesized history, physical examination, laboratory, imaging and/or other data to formulate a management plan including elements separate from the procedure itself and usual postprocedural care       PROCEDURES     Tendon Shealth Injection: left first dorsal extensor on 01/27/2024 12:50 PM  Details: ultrasound-guided  Laterality: left    Medications: 10 mg triamcinolone  acetonide 10 mg/mL    Medical Care Team Attestation: All ProcDoc orders were read back and verbally confirmed with the procedure provider, including but not limited to patient name, medication name, dose, and route, before any actions were taken.  Provider Attestation: The information documented by members of my medical care team was reviewed and verified for accuracy by me.           DME     DME ORDER:  Dx:  ,                          [  1]   Past Medical History:  Diagnosis Date    Allergic Don't know    Penicillin    Arthritis 2011    Lower back    BMI 60.0-69.9, adult (CMS-HCC) 05/29/2021    CTS (carpal tunnel syndrome)     Diabetes mellitus (CMS-HCC)     Eczema     High blood pressure     Neuromuscular disorder (CMS-HCC)     Psoriasis

## 2024-01-27 NOTE — Unmapped (Signed)
 You received a corticosteroid injection to reduce pain and inflammation.  Please note that it can take up to 2 weeks for this injection to fully work.  While many people will feel relief sooner, please be patient.    The injection contained a corticosteroid and a numbing agent.  The numbing agent can last for 1-6 hours.  After this wears off you may have increased pain until the steroid has a chance to work.    Unless you have been directed otherwise, you may take over-the-counter pain medications and apply ice to the affected area as needed. If you have prescription medications for treatment of your condition, you may take these according to the prescription instructions.    You can resume your normal daily activities, but consider resting the injected area for the next few days.      What are some of the possible side effects of a steroid injection?    Common side effects:  temporarily elevated blood sugar (in diabetic patients) that can last a few days   flushing of the skin, especially the face  temporary rise in blood pressure  discoloration or atrophy of the skin at the injection site    Call your doctor at once if you have:  persistent worsening pain or swelling, fever;  blurred vision, tunnel vision, eye pain, or seeing halos around lights;  fast or slow heartbeats;  increased blood pressure that is associated with severe headache, blurred vision, pounding in your neck or ears, anxiety, nosebleed;  headaches, ringing in your ears, dizziness, nausea, vision problems, pain behind your eyes    This is not a complete list of side effects and others may occur. Call your doctor for medical advice about side effects.     What other drugs may be affected after the injection?  Many drugs can interact with steroids. Not all possible interactions are listed here. Tell your doctor about all your current medicines and any you start or stop using, especially:  an antibiotic or antifungal medication;  birth control pills or hormone replacement therapy;  a blood thinner (warfarin, Coumadin, and others);  a diuretic or water pill;  insulin or oral diabetes medicine;  medicine to treat tuberculosis;  a nonsteroidal anti-inflammatory drug or NSAID (aspirin, ibuprofen, naproxen, diclofenac, indomethacin, Advil, Aleve, Celebrex, and many others); or  seizure medication.      You can reach our office through My [facility] Chart ([email or web address]) or by phone at [phone].    We will see you back as scheduled. If you need to schedule another appointment, please call [phone].    Thank you for coming to [facility] and the [facility], where we strive to provide innovative and comprehensive patient-centered care based on research evidence. We appreciate you choosing [facility] for your care, and we look forward to seeing you again for any future needs!      Sincerely,    Sabino Dick, MD               ----------      Thank you for coming to [facility] today!     We aim to provide you with the highest quality, individualized care.  If you have any unanswered questions after the visit, please do not hesitate to reach out to [place] on MyChart or leave a message for the nurse.  ?  MyChart messages: Messages sent to your provider will be checked by their clinical support staff throughout the day during  normal business hours (8:30 am-4:00 pm, Monday-Friday). Please note that responses may take up to 48 hours.  Please use this method of communication for non-urgent and non-emergent concerns, questions, refill requests or inquiries only.?Our team will help respond to all of your questions. Please note that you may be asked to see a provider either in person or by telehealth if addressing your questions would be best handled in that way.  Please keep in mind that these messages are not real time communications, so be patient when waiting for a response.    If you do not have access to MyChart, do not know how to use MyChart or have an issue that may require more extensive discussion, please call the nurses' call line: [phone].?This line is checked throughout the day and will be responded to as time allows.?Please note that return calls may take up to 48 hours, depending on the nature of the need.?  ?  If you have an issue that requires emergent attention that cannot wait, either call the Orthopaedics resident on call at [phone], consider coming to our [facility] walk-in clinic, or go to the nearest Emergency Department.    If you need to schedule future appointments, please call [phone].     We look forward to seeing you again in the future and appreciate you choosing [facility] for your care!    Thank you,    Sabino Dick, MD            We provide innovative and comprehensive patient centered care that is supported by evidence-based research                                                                                                    RESEARCH PARTICIPATION    Please check out our current research studies to see if you or someone you know may qualify at:    [email or web address]

## 2024-01-27 NOTE — Unmapped (Signed)
 Assessment and Plan:     Assessment & Plan    Postmenopausal vaginal bleeding  Postmenopausal spotting requires investigation to rule out endometrial pathology.  - Refer to gynecology for evaluation and possible endometrial biopsy.  - Order pelvic ultrasound to assess uterus.    Postoperative state after small bowel surgery  Post-surgery pain and delayed wound healing. Off work, seeking short-term disability. Return to work depends on wound healing assessment.  - Coordinate with surgeon for wound healing assessment and return to work date.               I personally spent 20 minutes face-to-face and non-face-to-face in the care of this patient, which includes all pre, intra, and post visit time (reviewing, evaluating and discussing pertinent records for patient's care) on the date of service.       No follow-ups on file.    Subjective:     HPI: Tracy Robles is a 58 y.o. female here for        History of Present Illness  Tracy Robles is a 58 year old female who presents for completion of disability forms following emergency surgery for small bowel obstruction.    She underwent emergency surgery on December 22, 2023, for scar tissue on her small intestines. Since the surgery, she has been out of work and is seeking short-term disability due to ongoing recovery. She continues to have her wound packed and experiences pain, which has delayed her return to work. She experiences pain underneath her abdomen, affecting her ability to sleep comfortably and causing difficulty walking upright. She uses pillows to support herself in bed to avoid rolling onto her stomach.    She reports experiencing dry vaginal bleeding, described as spotting when wiping, which is not enough to require a pad. She has been postmenopausal since age 76 and has not had any vaginal bleeding until now. The bleeding is described as 'dry blood mixed with a little bit of blood.' No significant vaginal bleeding requiring a pad and no bleeding from the urethra.    She has lost approximately 10 pounds since the surgery, noting a decreased appetite and the need to make herself eat.         ROS:   Review of Systems     Review of systems negative unless otherwise noted as per HPI.      The following portions of the patient's history were reviewed and updated as appropriate: allergies, current medications, past family history, past medical history, past social history, past surgical history and problem list.     Objective:     Vitals:    01/21/24 1103   BP: 136/85   Pulse: 106   Temp: 36.8 ??C (98.3 ??F)   SpO2: 98%     Body mass index is 47.54 kg/m??.    Physical Exam  Vitals and nursing note reviewed.   Constitutional:       General: She is not in acute distress.     Appearance: Normal appearance. She is not ill-appearing or toxic-appearing.   Cardiovascular:      Rate and Rhythm: Normal rate.   Pulmonary:      Effort: Pulmonary effort is normal. No respiratory distress.   Neurological:      Mental Status: She is alert.                Results             Allergies:     Lisinopril, Penicillins,  and Latex      THEODORO JUST, MD    [facility]:     Medication adherence and barriers to the treatment plan have been addressed. Opportunities to optimize healthy behaviors have been discussed. Patient / caregiver voiced understanding.

## 2024-01-28 DIAGNOSIS — G8929 Other chronic pain: Principal | ICD-10-CM

## 2024-01-28 DIAGNOSIS — M4316 Spondylolisthesis, lumbar region: Principal | ICD-10-CM

## 2024-01-28 DIAGNOSIS — M545 Chronic bilateral low back pain without sciatica: Principal | ICD-10-CM

## 2024-01-28 MED ORDER — MELOXICAM 15 MG TABLET
ORAL_TABLET | Freq: Every day | ORAL | 0 refills | 0.00000 days | PRN
Start: 2024-01-28 — End: ?

## 2024-01-29 DIAGNOSIS — M4316 Spondylolisthesis, lumbar region: Principal | ICD-10-CM

## 2024-01-29 DIAGNOSIS — M545 Chronic bilateral low back pain without sciatica: Principal | ICD-10-CM

## 2024-01-29 DIAGNOSIS — G8929 Other chronic pain: Principal | ICD-10-CM

## 2024-01-29 MED ORDER — MELOXICAM 15 MG TABLET
ORAL_TABLET | Freq: Every day | ORAL | 0 refills | 30.00000 days | Status: CP | PRN
Start: 2024-01-29 — End: 2024-01-29

## 2024-02-01 NOTE — Unmapped (Signed)
 [facility] ACUTE CARE AND GENERAL SURGERY CLINIC NOTE      Patient Name: Tracy Robles  Medical Record Number: [id number]  Date of Service: 02/02/2024    Reason for Visit: Follow up wound check    History of Present Illness     Tracy Robles 58 y.o. female PMHx of psoriasis, eczema, DM, HTN, and obesity who presented to the ED on 9/9 with acute onset epigastric pain and emesis. CTA abdomen demonstrating small bowel obstruction with transition point in the right lower quadrant. The patient was taken to the OR on 12/23/2023 for diagnostic laparoscopy with laparoscopic lysis of adhesions converted to open laparotomy with repair of small bowel perforations x2. Operative findings were adhesive band from the uterus adhered to the small bowel causing small bowel obstruction with resultant upstream dilation of bowel; two small bowel perforations noted in the jejunum requiring repair; no bowel resection needed.  She tolerated the procedure well, was extubated in the OR, and received routine post-operative care before being transferred to the floor. Her diet was advanced and by discharge she was tolerating a regular diet. She was voiding adequately, ambulating independently, and her pain was controlled with PO pain medications. She was discharged home on POD #6 in good condition.     She see seen again in clinic on 9/29 with complaints of breathlessness, incisional leakage, and severe abdominal pain. She was advised to present to the ER. She presented to the ED at [facility] and was admitted there. CT revealed no PE, but did show large hematoma. She was monitored with serial CBC, abdominal exams and there was some concern for potential developing fistula.    She was last seen two weeks ago with some moderate improvement in her pain and drainage. Silver nitrate was used to treat her umbilical area.      Patient returns to clinic today with no new concerns or complaints.She reports she still has some fibrinous exudate from the umbilical area, but her pain is much improved.  She is now walking better and has more stamina.  Patient denies any fever,, incisional redness or swelling. Reports eating and drinking fine and denies any nausea, vomiting, diarrhea or constipation. Home health is still coming 2x weekly. 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      Reivew of Systems:  Negative except otherwise noted in the HPI.    Physical Exam:  Vitals:    02/02/24 1050   BP: 156/92   Pulse: 100   Resp: 14   Temp: 36.5 ??C (97.7 ??F)   SpO2: 98%       General: awake, alert, afebrile, in NAD  Neuro: Alert and oriented ??3  Lungs: Normal work of breathing on room air  Abdomen: Soft, obese non tender, non distended. Incision is mostly C/D/I with no erythema, edema or ecchymosis noted. Some fibrinous exudate along superior aspect of umbilical incision which was mildly dehissed, much improved from prior. No incisional drainage or incisional hernia noted.   Assessment & Plan    Tracy Robles 58 y.o. female PMHx of morbid obesity, psoriasis, eczema, DM, HTN, and obesity who presented to the ED on 9/9 with SBO and underwent ex lap with repair of small bowel perforations x2. She was subsequently readmitted at [facility] for postoperative hematoma.     On exam no signs or symptoms of infection is noted and patient is progressing. Discussed with patient that we could  do another silver nitrate treatment today to the non healed area inside her umbilicus, but patient declines because she does not want to feel any stinging. Discussed to keep packing as she has been and to continue with home health.  Will plan to see her back in two weeks for recheck. Will provide note for going back to work on November 3rd.    If any new questions or concerns should arise to give the clinic a call, otherwise continue follow-up with their primary care physician.      Patient was agreeable to plan

## 2024-02-02 ENCOUNTER — Ambulatory Visit: Admit: 2024-02-02 | Discharge: 2024-02-03 | Payer: BLUE CROSS/BLUE SHIELD

## 2024-02-02 DIAGNOSIS — Z5189 Encounter for other specified aftercare: Principal | ICD-10-CM

## 2024-02-03 NOTE — Unmapped (Signed)
 [facility] Refill Coordination Note    Specialty Medication(s) to be Shipped:   Inflammatory Disorders: Cosentyx     Other medication(s) to be shipped: No additional medications requested for fill at this time    Specialty Medications not needed at this time: N/A     Tracy Robles, DOB: [birth year]  Phone: There are no phone numbers on file.      All above HIPAA information was verified with patient.     Was a Nurse, learning disability used for this call? No    Completed refill call assessment today to schedule patient's medication shipment from the [facility]  [phone]).  All relevant notes have been reviewed.     Specialty medication(s) and dose(s) confirmed: Regimen is correct and unchanged.   Changes to medications: Mardene reports no changes at this time.  Changes to insurance: No  New side effects reported not previously addressed with a pharmacist or physician: None reported  Questions for the pharmacist: No    Confirmed patient received a Conservation officer, historic buildings and a Surveyor, mining with first shipment. The patient will receive a drug information handout for each medication shipped and additional FDA Medication Guides as required.       DISEASE/MEDICATION-SPECIFIC INFORMATION        For patients on injectable medications: Next injection is scheduled for 02/09/2024.    SPECIALTY MEDICATION ADHERENCE     Medication Adherence    Patient reported X missed doses in the last month: 0  Specialty Medication: COSENTYX  PEN (2 PENS) 150 mg/mL Pnij injection (secukinumab )  Patient is on additional specialty medications: No              Were doses missed due to medication being on hold? No     COSENTYX  PEN (2 PENS) 150 mg/mL Pnij injection (secukinumab ): 0 doses of medicine on hand       REFERRAL TO PHARMACIST     Referral to the pharmacist: Not needed      [facility]     Shipping address confirmed in Epic.     Cost and Payment: Patient has a $0 copay, payment information is not required.    Delivery Scheduled: Yes, Expected medication delivery date: 02/08/2024.     Medication will be delivered via Same Day Courier to the prescription address in Epic WAM.    Lucie CHRISTELLA Forts   [facility]  Specialty Technician

## 2024-02-04 ENCOUNTER — Inpatient Hospital Stay: Admit: 2024-02-04 | Discharge: 2024-02-04 | Payer: BLUE CROSS/BLUE SHIELD

## 2024-02-05 NOTE — Telephone Encounter (Signed)
 Pls assist

## 2024-02-05 NOTE — Telephone Encounter (Signed)
 Received home health paperwork for provider to sign off on

## 2024-02-05 NOTE — Telephone Encounter (Signed)
 Forms faxed today [facility]

## 2024-02-05 NOTE — Telephone Encounter (Signed)
 Fyi, can you place at the front in fax folder

## 2024-02-05 NOTE — Telephone Encounter (Signed)
 Pls advise.

## 2024-02-08 ENCOUNTER — Inpatient Hospital Stay: Admit: 2024-02-08 | Discharge: 2024-02-08 | Payer: BLUE CROSS/BLUE SHIELD

## 2024-02-08 MED FILL — COSENTYX PEN 300 MG/2 PENS (150 MG/ML) SUBCUTANEOUS PEN INJECTOR: SUBCUTANEOUS | 28 days supply | Qty: 2 | Fill #11

## 2024-02-09 NOTE — Telephone Encounter (Signed)
 I called Katheryn back and told her Per Dr. Servando it was fine if they continue to go to the patients house one more week.

## 2024-02-09 NOTE — Telephone Encounter (Signed)
 Lori from [place] called and would like to know if they can continue to go to the patients home just one more week. Please advise.

## 2024-02-11 ENCOUNTER — Ambulatory Visit: Admit: 2024-02-11 | Discharge: 2024-02-12 | Payer: BLUE CROSS/BLUE SHIELD | Attending: Orthopedic | Primary: Orthopedic

## 2024-02-11 DIAGNOSIS — M47816 Spondylosis without myelopathy or radiculopathy, lumbar region: Principal | ICD-10-CM

## 2024-02-11 DIAGNOSIS — G8929 Other chronic pain: Principal | ICD-10-CM

## 2024-02-11 DIAGNOSIS — M545 Chronic bilateral low back pain without sciatica: Principal | ICD-10-CM

## 2024-02-11 DIAGNOSIS — M48062 Spinal stenosis, lumbar region with neurogenic claudication: Principal | ICD-10-CM

## 2024-02-11 DIAGNOSIS — M4316 Spondylolisthesis, lumbar region: Principal | ICD-10-CM

## 2024-02-11 NOTE — Patient Instructions (Addendum)
 Medications: Take over-the-counter medications as needed and as tolerated  Take medications as precribed by your Medical Team  Activity: Activities as tolerated.    Conservative Care: Continue Physical Therapy and/or Home Exercises as doing.  Surgical Care: Let's continue to reserve surgical care as a last resort.  Imaging studies: none  Labs: None  Return for Next scheduled follow up with PM&R for injection consult.   Today we discussed lumbar epidural steroid injections and lumbar medial branch blocks/radiofrequency ablations.  Information about these procedures attached.     Contact our nursing team via MyChart or telephone with any clinical questions/concerns.  Our scheduling team and support staff can be reached at [phone].

## 2024-02-11 NOTE — Progress Notes (Signed)
 ORTHOPAEDIC SPINE CLINIC NOTE       Garnette CHRISTELLA Hamilton, NP  [email or web address]  [phone]        Patient Name:Tracy Robles  MRN: [id number]  DOB: 28-Mar-1966    Date: 02/11/2024    PCP: Servando Hire, MD    Telephone Encounter      The patient reports they are physically located in [place]  and is currently: at home. I conducted a phone visit.  I spent 9 minutes on the phone call with the patient on the date of service .        ASSESSMENT:     1. Spondylolisthesis at L4-L5 level    2. Chronic bilateral low back pain without sciatica    3. Lumbar facet arthropathy    4. Spinal stenosis of lumbar region with neurogenic claudication        Tracy Robles is a 57 y.o. female with chronic R>L axial low back pain.  Known L3-L4 and L4-L5 spondylolisthesis.  Prior physical exam suggestive of primarily right lumbar facet joint pain (+ right facet loading test), and clinical findings of lumbar spinal stenosis with neurogenic claudication.     Lumbar MRI 01/2024 demonstrates moderate to severe canal stenosis L3-L4, severe canal stenosis L4-L5, advanced facet arthropathy, and nerve root clumping at L5 suggestive of possible chronic arachnoiditis.    PROM:  None     PLAN and RECOMMENDATIONS:     Options reviewed with patient.  We a long conversation reviewing her recent lumbar MRI, discussing her symptoms are likely combination of both lumbar facet joint pain and lumbar spinal stenosis with neurogenic claudication.  Given the nerve root clumping at L5 suggestive of possible arachnoiditis, I did recommend a bilateral L5-S1 TFESI for symptom management and diagnostic value.  We also discussed lumbar MBB/RFA's.  Patient decision making aids (handouts) provided over MyChart as the patient would like to take time to consider these options and she would like to follow-up with PM&R for an injection consultation.  Patient would like to hold off on surgical intervention.    Future Considerations:  -Lumbar MBB/RFA vs L5/S1 TFESI  - surgical consult (current BMI 46, will need weight loss management)    Patient Instructions   Medications: Take over-the-counter medications as needed and as tolerated  Take medications as precribed by your Medical Team  Activity: Activities as tolerated.    Conservative Care: Continue Physical Therapy and/or Home Exercises as doing.  Surgical Care: Let's continue to reserve surgical care as a last resort.  Imaging studies: none  Labs: None  Return for Next scheduled follow up with PM&R for injection consult.   Today we discussed lumbar epidural steroid injections and lumbar medial branch blocks/radiofrequency ablations.  Information about these procedures attached.     Contact our nursing team via MyChart or telephone with any clinical questions/concerns.  Our scheduling team and support staff can be reached at [phone].     Orders Placed This Encounter   Procedures    Ambulatory referral to Spine Center          SUBJECTIVE:     Chief Complaint:  No chief complaint on file.    History of Present Illness:        02/11/24 1603   PainSc: 4      Tracy Robles is a 58 y.o. female with a relevant PMH of DM, HTN, psoriasis seen in consultation at the request of Servando Hire, MD for evaluation of low back pain. Date  of onset: since chronic.    Established Patient Interval History (02/11/2024):  Patient last seen 04/21/2023.  Today, patient reports symptoms are unchanged.  Patient reports no change in her symptoms since last seen.  Continued axial low back pain bilaterally, right side worse than left.  She denies any radicular pain/paresthesia.  Recently obtained lumbar MRI.  Patient does report symptoms get worse with standing/walking, reports symptoms are better at rest and with leaning forward.  Patient not interested in surgical intervention.    Recent accidents/injuries/falls? no  Recent Relevant ED/Urgent Care Encounters? yes ED 01/11/24 for a intraperitoneal hematoma with no active bleeding.  New or worsening neurologic symptoms?  No    Functional limitations include: pain with sitting for prolonged periods    Current Treatments Previous Treatments   Current Relevant Pain Medications:  NSAID: Meloxicam  15mg  PRN  AED: topiramate  25mg  nightly  Opioid: Oxycodone  5mg  PRN    Current Physical Therapy: Current Home Exercise Program and PT Recently completed    Adjunct Treatments: Activity Modification    In a Pain Clinic: No Prior Relevant Pain Medications:  NSAID: Aleve  AED: Gabapentin  600mg  BID    Prior Physical Therapy: Yes.  - PT 03/2023-07/2023  - PT 03/2021 for low back    Injections:Yes.  - Tendon Shealth Injection: left first dorsal extensor on 01/27/2024     Prior Relevant Surgeries: None.        Medical History   She  has a past medical history of Allergic (Don't know), Arthritis (2011), BMI 60.0-69.9, adult (CMS-HCC) (05/29/2021), CTS (carpal tunnel syndrome), Diabetes mellitus (CMS-HCC), Eczema, High blood pressure, Neuromuscular disorder (CMS-HCC), and Psoriasis.     Surgical History   She  has a past surgical history that includes pr lap,diagnostic abdomen (N/A, 12/22/2023); pr lap, surg enterolysis (N/A, 12/22/2023); pr enterotomy,non-duod,explor/bx/fb removal (N/A, 12/22/2023); pr exploratory of abdomen (N/A, 12/22/2023); and Other surgical history (1995).     Allergies   Lisinopril, Penicillins, and Latex   Medications   She has a current medication list which includes the following prescription(s): glucose blood, [Paused] blood-glucose meter, blood-glucose sensor, cetirizine , diclofenac sodium, empagliflozin , loratadine, lorazepam, [Paused] losartan , meloxicam , multivitamin gummies, onetouch delica plus lancet, cosentyx  pen (2 pens), simvastatin , [Paused] spironolactone , mounjaro , topiramate , and triamcinolone .   Review of Systems A 10-system review was performed by questionnaire and noted in the electronic chart.  Positives noted/discussed.  Balance of systems was negative.    Fever/chills: denies  Recent unexplained weight loss: denies  Bowel/bladder symptoms: denies   Family History Her family history includes Alzheimer's disease (age of onset: 16 - 37) in her mother; Arthritis in her mother; Asthma in her father; Breast cancer in her maternal aunt; Clotting disorder in her father; Dementia in her mother; Diabetes in her father and sister; Hypertension in her father and mother; Kidney disease in her father; Miscarriages / Stillbirths in her maternal grandmother and sister; Stroke in her maternal grandmother.     Social History She  reports that she has never smoked. She has been exposed to tobacco smoke. She has never used smokeless tobacco. She reports current alcohol use of about 1.0 standard drink of alcohol per week. She reports that she does not use drugs.        Occupational History    Not on file        OBJECTIVE:     PHYSICAL EXAM:  Vitals: There were no vitals taken for this visit.  Appearance/voice: no acute distress   Mental Status: Tracy Leaf  Robles is oriented to time, place, and person & is alert and cooperative       MEDICAL DECISION MAKING    Test Results:  Imaging:   L-spine MRI. [facility]. Date:01/2024.  Impression: Severe bilateral facet arthropathy L3-L5 with right L3-L4 facet joint synovial cyst.  Moderate to severe canal stenosis L3-L4, severe canal stenosis L4-L5.  Clumping of nerve roots at L5 suggestive of chronic arachnoiditis.    CT Abdomen & Pelvis. [facility]. Date:12/2023.  Impression: No acute findings.  Unchanged grade 2 anterolisthesis L4 on L5, unchanged anterolisthesis L3 on L4.  Lower lumbar facet arthropathy.    L-spine XR. [facility]. Date:01/2023.  Impression: Grade 2 anterolisthesis L4 on L5.  Grade 1 anterolisthesis of L3 on L4. Mild left lateral listhesis of L3 on L4.    SI Joint XR. [place]. Date:11/2022.  Impression: Mild bilateral SI joint OA.    L-spine XR. [place]. Date:11/2018.  Impression: Grade 2 anterolisthesis L4-L5      I, Garnette Hamilton, NP, personally interpreted the images. The available reports were reviewed. Results were discussed with the patient over the phone.    Labs:  Lab Results   Component Value Date    A1C 5.4 11/26/2023       Discussion:  Clinical findings, diagnostic/treatment options, and plan were discussed with the patient.  Activities - Advised activities as tolerated, using pain as a guide.  Conservative Care - Options discussed.  Epidural Steroid Injections - Discussed risks/benefits, symptom management and diagnostic value.  Medial Branch Blocks/Radiofrequency Ablations - Discussed risks/benefits, symptom management and diagnostic value.      E&M Coding:    MEDICAL DECISION MAKING (level of service defined by 2/3 elements)     Number/Complexity of Problems Addressed 1 stable chronic illness (99203/99213)   Amount/Complexity of Data to be Reviewed/Analyzed Independent interpretation of a test performed by another physician/other qualified health care professional (99204/99214)   Risk of Complications/Morbidity/Mortality of Management --LOW Risk of Morbidity from Additional Diagnostic Testing or Treatment (99203/99213)--     Or TIME     Total Time for E/M Services on the Date of Encounter [phone] - I personally spent , 5-10 minutes on the phone with the patient.          cc: Servando Hire, MD, Servando Hire, MD

## 2024-02-12 NOTE — Telephone Encounter (Signed)
 Received back from Dr. Servando. Pleaced in the fax folder up front to be faxed and scanned into chart.

## 2024-02-12 NOTE — Telephone Encounter (Signed)
 Received a fax from [facility] for orders. I will place on Dr. Servando desk for signature.

## 2024-02-14 NOTE — Progress Notes (Signed)
 [facility] ACUTE CARE AND GENERAL SURGERY CLINIC NOTE      Patient Name: Tracy Robles  Medical Record Number: [id number]  Date of Service: 02/15/2024    Reason for Visit: Follow up wound check      History of Present Illness    Tracy Robles 58 y.o. female PMHx of psoriasis, eczema, DM, HTN, and obesity who presented to the ED on 9/9 with acute onset epigastric pain and emesis. CTA abdomen demonstrating small bowel obstruction with transition point in the right lower quadrant. The patient was taken to the OR on 12/23/2023 for diagnostic laparoscopy with laparoscopic lysis of adhesions converted to open laparotomy with repair of small bowel perforations x2. Operative findings were adhesive band from the uterus adhered to the small bowel causing small bowel obstruction with resultant upstream dilation of bowel; two small bowel perforations noted in the jejunum requiring repair; no bowel resection needed.  She was discharged home on POD #6 in good condition.     She see seen again in clinic on 9/29 with complaints of breathlessness, incisional leakage, and severe abdominal pain. She was advised to present to the ER. She presented to the ED at [facility] and was admitted there. CT revealed no PE, but did show large hematoma. She was monitored with serial CBC, abdominal exams and there was some concern for potential developing fistula.    Seen again on 10/9 with some moderate improvement in her pain and drainage. Silver nitrate was used to treat her umbilical area.  Seen again on 10/21 and noted to be healing well, but declined further silver nitrate to area.     Patient returns to clinic today with no new concerns or complaints.She reports she still has some scant drainage from the umbilical area, but her pain is much improved.  She is now walking better and has more stamina.  Patient denies any fever,incisional redness or swelling. Reports eating and drinking fine and denies any nausea, vomiting, diarrhea or constipation. She was discharged from home health. She is planning on returning to work on November 5th. 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 I filled out her paperwork for her absence from work. However, she has some additional forms for FMLA today, that involve letting her off work during a three hour gap to help care for her mother, and is hoping to have this filled out for a year. She drives a bus for [employer] and is hoping to return to work on November 3rd.     She asks if she is able to restart her losartan . She states she was advised to hold it during one of her admissions and has not taken it since.       Reivew of Systems:  Negative except otherwise noted in the HPI.    Physical Exam:  Vitals:    02/15/24 1121   BP: 186/87   Pulse: 112   Temp: 36.4 ??C (97.5 ??F)     * BP on manual recheck after sitting for 20 mins was 140/84    General: awake, alert, afebrile, in NAD  Neuro: Alert and oriented ??3  Lungs: Normal work of breathing on room air  Abdomen: Soft, obese non tender, non distended. Incision is mostly C/D/I with no erythema, edema or ecchymosis noted. Still has a very small area residual inside her umbilicus that has not healed completely, much improved in appearence today, with no fibrinous exudate.          Assessment &  Plan    Tracy Robles 58 y.o. female PMHx of morbid obesity, psoriasis, eczema, DM, HTN, and obesity who presented to the ED on 9/9 with SBO and underwent ex lap with repair of small bowel perforations x2. She was subsequently readmitted at [facility] for postoperative hematoma.     On exam no signs or symptoms of infection is noted and patient is healing well. She still has a very small area residual inside her umbilicus that has not healed completely, much improved in appearence today. I think the wet to dry packing being done by [facility] was not allowing the area to heal. Advised her to stop packing and allow area to heal on it's own. Advised her it was okay to restart her losartan . Continue to monitor BP at home.     I let her know that she will need to follow up with her PCP about the additional FMLA, as we do not fill out things for durations of more than a few weeks, and this is not related to her injury.     Discussed with the patient at this time, does not need to follow-up with the general surgery clinic only on an as-needed basis.  If any new questions or concerns should arise to give the clinic a call, otherwise continue follow-up with their primary care physician.      Patient was agreeable to plan

## 2024-02-15 ENCOUNTER — Ambulatory Visit: Admit: 2024-02-15 | Discharge: 2024-02-15 | Payer: BLUE CROSS/BLUE SHIELD

## 2024-02-15 ENCOUNTER — Ambulatory Visit: Admit: 2024-02-15 | Discharge: 2024-02-15 | Payer: BLUE CROSS/BLUE SHIELD | Attending: Orthopedic | Primary: Orthopedic

## 2024-02-15 DIAGNOSIS — M4316 Spondylolisthesis, lumbar region: Principal | ICD-10-CM

## 2024-02-15 DIAGNOSIS — Z5189 Encounter for other specified aftercare: Principal | ICD-10-CM

## 2024-02-15 DIAGNOSIS — M545 Chronic bilateral low back pain without sciatica: Principal | ICD-10-CM

## 2024-02-15 DIAGNOSIS — G8929 Other chronic pain: Principal | ICD-10-CM

## 2024-02-15 MED ORDER — MELOXICAM 15 MG TABLET
ORAL_TABLET | Freq: Every day | ORAL | 2 refills | 30.00000 days | Status: CP | PRN
Start: 2024-02-15 — End: ?

## 2024-02-15 NOTE — Telephone Encounter (Signed)
 fyi

## 2024-02-15 NOTE — Telephone Encounter (Signed)
 Pt stopped by and dropped off forms to be completed and would like for them to be mailed back to her when completed. Forms have been placed in providers folder.

## 2024-02-15 NOTE — Patient Instructions (Addendum)
 Medications: Take over-the-counter medications as needed and as tolerated  If you don't have liver disease, the safest medication for pain is acetaminophen  Extended Release (Tylenol  Arthritis).  Take 650mg  (1 extended release tab) at a time for pain relief every 8 hours.  Do not take more than 3000mg  per day.  Continue Meloxicam  15mg .  Take as needed as prescribed..  Do not take with other NSAIDs (ie Motrin, Aleve, etc).  You may take with Acetaminophen  as listed above.   Activity: Recommend progressive/daily walking.  Continue daily walking program.     Conservative Care: Continue Physical Therapy and/or Home Exercises as doing.  Surgical Care: Let's continue to reserve surgical care as a last resort.  Imaging studies: none  Labs: None  Return for Next scheduled follow up with Dr. Janith as scheduled.     Contact our nursing team via MyChart or telephone with any clinical questions/concerns.  Our scheduling team and support staff can be reached at [phone].

## 2024-02-15 NOTE — Progress Notes (Signed)
 ORTHOPAEDIC SPINE CLINIC NOTE       Garnette CHRISTELLA Hamilton, NP  [email or web address]  [phone]        Patient Name:Tracy Robles  MRN: [id number]  DOB: 1965/12/04    Date: 02/15/2024    PCP: Servando Hire, MD    ASSESSMENT:     1. Chronic bilateral low back pain without sciatica    2. Spondylolisthesis at L4-L5 level        Tracy Robles is a 58 y.o. female with chronic R>L axial low back pain.  Known L3-L4 and L4-L5 spondylolisthesis.  Prior physical exam suggestive of primarily right lumbar facet joint pain (+ right facet loading test), and clinical findings of lumbar spinal stenosis with neurogenic claudication.     Lumbar MRI 01/2024 demonstrates moderate to severe canal stenosis L3-L4, severe canal stenosis L4-L5, advanced facet arthropathy, and nerve root clumping at L5 suggestive of possible chronic arachnoiditis.    PROM:  MJOA: none  Promis Cat V1.1 - Pain Interference    01/22/2024  9:59 PM EDT - Tracy Robles by Tracy Robles (Proxy) 01/18/2024  3:35 PM EDT - Tracy Robles by Tracy Tracy Robles (Proxy)   PROMIS Pain Interference T-Score (range: 10 - 90) (range: 10 - 90) 50 (within normal limits) 58 (mild)     Promis Cat V2.0 - Physical Function    01/22/2024 10:01 PM EDT - Tracy Robles by Tracy Tracy Robles (Proxy) 01/18/2024  3:36 PM EDT - Tracy Robles by Tracy Tracy Robles (Proxy)   PROMIS Physical Function T-Score (range: 10 - 90) 50 (within normal limits) 50 (within normal limits)          PLAN and RECOMMENDATIONS:     Options reviewed with patient.  Overall, patient feels like symptoms are managed and the meloxicam  15mg  once daily as needed has been beneficial.  Recommended she continue her daily walking program and will refill meloxicam .  Patient still considering spine injections, scheduled to follow-up with Dr. Carneiro in January for injection consult.    Future Considerations:  -Lumbar MBB/RFA vs L5/S1 TFESI  - surgical consult (current BMI 45, will need weight loss management)    Patient Instructions   Medications: Take over-the-counter medications as needed and as tolerated  If you don't have liver disease, the safest medication for pain is acetaminophen  Extended Release (Tylenol  Arthritis).  Take 650mg  (1 extended release tab) at a time for pain relief every 8 hours.  Do not take more than 3000mg  per day.  Continue Meloxicam  15mg .  Take as needed as prescribed..  Do not take with other NSAIDs (ie Motrin, Aleve, etc).  You may take with Acetaminophen  as listed above.   Activity: Recommend progressive/daily walking.  Continue daily walking program.     Conservative Care: Continue Physical Therapy and/or Home Exercises as doing.  Surgical Care: Let's continue to reserve surgical care as a last resort.  Imaging studies: none  Labs: None  Return for Next scheduled follow up with Dr. Janith as scheduled.     Contact our nursing team via MyChart or telephone with any clinical questions/concerns.  Our scheduling team and support staff can be reached at [phone].     No orders of the defined types were placed in this encounter.    Medications Prescribed Today               meloxicam  (MOBIC ) 15 MG tablet Take 1 tablet (15 mg total) by mouth daily as needed for pain.  SUBJECTIVE:     Chief Complaint:  Chief Complaint   Patient presents with    Follow-up     No new onset symptoms at this time      History of Present Illness:        02/15/24 1247   PainSc: 0-No pain     Tracy Robles is a 58 y.o. female with a relevant PMH of DM, HTN, psoriasis seen in consultation at the request of Servando Hire, MD for evaluation of low back pain. Date of onset: since chronic.    Established Patient Interval History (02/15/2024):  Patient last seen 02/11/2024.  Today, patient reports symptoms are unchanged.  Overall, patient reports no change in symptoms.  Continued axial low back pain.  No radicular pain or paresthesia.  Has been walking daily, has lost 2lbs since last seen.  Reports taking meloxicam  15mg  daily on an as needed basis with good effect.  Reports meloxicam  is very helpful on the bad days. Denies side effects.  Patient does report symptoms get worse with standing/walking, reports symptoms are better at rest and with leaning forward.  Patient not interested in surgical intervention.    Recent accidents/injuries/falls? no  Recent Relevant ED/Urgent Care Encounters? yes ED 01/11/24 for a intraperitoneal hematoma with no active bleeding.  New or worsening neurologic symptoms?  No    Functional limitations include: pain with sitting for prolonged periods    Current Treatments Previous Treatments   Current Relevant Pain Medications:  Acetaminophen    NSAID: Meloxicam  15mg  PRN  AED: topiramate  25mg  nightly  Opioid: Oxycodone  5mg  PRN    Current Physical Therapy: Current Home Exercise Program and PT Recently completed    Adjunct Treatments: Activity Modification    In a Pain Clinic: No Prior Relevant Pain Medications:  NSAID: Aleve  AED: Gabapentin  600mg  BID    Prior Physical Therapy: Yes.  - PT 03/2023-07/2023  - PT 03/2021 for low back    Injections:Yes.  - Tendon Shealth Injection: left first dorsal extensor on 01/27/2024     Prior Relevant Surgeries: None.        Medical History   She  has a past medical history of Allergic (Don't know), Arthritis (2011), BMI 60.0-69.9, adult (CMS-HCC) (05/29/2021), CTS (carpal tunnel syndrome), Diabetes mellitus (CMS-HCC), Eczema, High blood pressure, Neuromuscular disorder (CMS-HCC), and Psoriasis.     Surgical History   She  has a past surgical history that includes pr lap,diagnostic abdomen (N/A, 12/22/2023); pr lap, surg enterolysis (N/A, 12/22/2023); pr enterotomy,non-duod,explor/bx/fb removal (N/A, 12/22/2023); pr exploratory of abdomen (N/A, 12/22/2023); and Other surgical history (1995).     Allergies   Lisinopril, Penicillins, and Latex   Medications   She has a current medication list which includes the following prescription(s): glucose blood, blood-glucose sensor, cetirizine , diclofenac sodium, empagliflozin , loratadine, lorazepam, [Paused] losartan , multivitamin gummies, onetouch delica plus lancet, oxycodone , cosentyx  pen (2 pens), simvastatin , mounjaro , topiramate , triamcinolone , [Paused] blood-glucose meter, meloxicam , and [Paused] spironolactone .   Review of Systems A 10-system review was performed by questionnaire and noted in the electronic chart.  Positives noted/discussed.  Balance of systems was negative.    Fever/chills: denies  Recent unexplained weight loss: denies  Bowel/bladder symptoms: denies   Family History Her family history includes Alzheimer's disease (age of onset: 14 - 29) in her mother; Arthritis in her mother; Asthma in her father; Breast cancer in her maternal aunt; Clotting disorder in her father; Dementia in her mother; Diabetes in her father and sister; Hypertension in her father and mother;  Kidney disease in her father; Miscarriages / Stillbirths in her maternal grandmother and sister; Stroke in her maternal grandmother.     Social History She  reports that she has never smoked. She has been exposed to tobacco smoke. She has never used smokeless tobacco. She reports current alcohol use of about 1.0 standard drink of alcohol per week. She reports that she does not use drugs.        Occupational History    Not on file        OBJECTIVE:     PHYSICAL EXAM:  Vitals: Temp 36.5 ??C (97.7 ??F) (Temporal)  - Ht 160 cm (5' 3)  - Wt (!) 117 kg (258 lb)  - BMI 45.70 kg/m??   Appearance/voice: no acute distress   Mental Status: Tracy Robles is oriented to time, place, and person & is alert and cooperative  Affect: alert and cooperative  Gait: normal  Strength: Strength intact in bilat LE.  Neurologic: Sensation to light touch grossly intact  Extremities/Skin: No cyanosis, clubbing, or atrophy noted on inspection of bilat hands.         MEDICAL DECISION MAKING    Test Results:  Imaging:   L-spine MRI. [place]. Date:01/2024.  Impression: Severe bilateral facet arthropathy L3-L5 with right L3-L4 facet joint synovial cyst.  Moderate to severe canal stenosis L3-L4, severe canal stenosis L4-L5.  Clumping of nerve roots at L5 suggestive of chronic arachnoiditis.    CT Abdomen & Pelvis. [place]. Date:12/2023.  Impression: No acute findings.  Unchanged grade 2 anterolisthesis L4 on L5, unchanged anterolisthesis L3 on L4.  Lower lumbar facet arthropathy.    L-spine XR. [place]. Date:01/2023.  Impression: Grade 2 anterolisthesis L4 on L5.  Grade 1 anterolisthesis of L3 on L4. Mild left lateral listhesis of L3 on L4.    SI Joint XR. [place]. Date:11/2022.  Impression: Mild bilateral SI joint OA.    L-spine XR. [place]. Date:11/2018.  Impression: Grade 2 anterolisthesis L4-L5      I, Garnette Hamilton, NP, personally interpreted the images. Images were reviewed with the patient on the PACS monitor. The available reports were reviewed.    Labs:  Lab Results   Component Value Date    A1C 5.4 11/26/2023       Discussion:  Clinical findings, diagnostic/treatment options, and plan were discussed with the patient.  Activities - Advised activities as tolerated, using pain as a guide.  Conservative Care - Options discussed.  Medications - risks/benefits of meloxicam  were discussed.      E&M Coding:    MEDICAL DECISION MAKING (level of service defined by 2/3 elements)     Number/Complexity of Problems Addressed 1 stable chronic illness (99203/99213)   Amount/Complexity of Data to be Reviewed/Analyzed 2 points: Review prior notes (1 point per unique source); Review test results (1 point per unique test); Order tests (1 point per unique test) (99203/99213)   Risk of Complications/Morbidity/Mortality of Management Prescription Medication (99204/99214)     Or TIME     Total Time for E/M Services on the Date of Encounter --ESTABLISHED PATIENTS--          cc: Servando Hire, MD, Servando Hire, MD

## 2024-02-16 NOTE — Telephone Encounter (Signed)
 Placing forms on Dr. Wheeler desk.

## 2024-02-17 DIAGNOSIS — Z23 Encounter for immunization: Principal | ICD-10-CM

## 2024-02-17 NOTE — Progress Notes (Signed)
 Patient in clinic for office visit, FluLaval - 6 months and up Flu Vaccine vaccine administered per protocol,NCIR and/or EPIC chart reviewed and vaccine accuracy verified with teammates: 2025-2026 Flu vaccine season , patient eligible to receive vacstock: Private Vaccine, vaccine administered Right Deltoid, pt tolerated well, no s/s of a reaction noted, VIS given to patient

## 2024-02-18 NOTE — Progress Notes (Signed)
 Assessment and Plan:     Assessment & Plan  Postoperative state after small bowel surgery  Postoperative hematoma still draining, no packing needed. Awaiting job readiness evaluation clearance. FMLA paperwork in process.  - Complete FMLA paperwork for absence until May 5th, 2026.  - Advise follow-up with surgeon if pain occurs.    Hypertension  Hypertension poorly controlled. Blood pressure elevated at 146/87 mmHg. Previously discontinued amlodipine  and losartan  due to side effects. Currently on spironolactone .  - Instruct to take half of a 10 mg amlodipine  tablet daily.  - Monitor blood pressure at home and report significant changes.             I personally spent 25 minutes face-to-face and non-face-to-face in the care of this patient, which includes all pre, intra, and post visit time (reviewing, evaluating and discussing pertinent records for patient's care) on the date of service.       No follow-ups on file.    Subjective:     HPI: Tracy Robles is a 58 y.o. female here for        History of Present Illness  Tracy Robles is a 58 year old female who presents for follow-up regarding post-surgical recovery and FMLA paperwork.    She underwent surgery on December 22, 2023, and was hospitalized again from September 29 to January 14, 2024, due to a hematoma. The hematoma is still draining but does not require packing, only a bandage. She is currently receiving wound care and has not returned to work as she is awaiting clearance from job dispensing optician.    She is currently out of work and is using FMLA for extended time off. She is concerned about her ability to return to work and is waiting for further instructions from her job dispensing optician. She anticipates possibly returning to work by May 2026, but this is subject to change based on her recovery.    She reports losing weight and mentions her current weight is between 258 and 259 pounds. She is not consistently taking her blood pressure medications, specifically losartan  and amlodipine , due to side effects like frequent urination. She is currently only on spironolactone . Her blood pressure readings at home are around 146/80, which she notes can be affected by stress from her mother.    She has a history of hypertension and was previously on amlodipine  10 mg, which she has at home and can cut in half to take 5 mg daily. She also has losartan , which she takes on weekends. She is concerned about potential future pain.         ROS:   Review of Systems     Review of systems negative unless otherwise noted as per HPI.      The following portions of the patient's history were reviewed and updated as appropriate: allergies, current medications, past family history, past medical history, past social history, past surgical history and problem list.     Objective:     Vitals:    02/17/24 1416   BP: 146/87   Pulse: 106   Temp: 36.5 ??C (97.7 ??F)   SpO2: 99%     Body mass index is 45.88 kg/m??.    Physical Exam  Vitals and nursing note reviewed.   Constitutional:       General: She is not in acute distress.     Appearance: Normal appearance. She is not ill-appearing, toxic-appearing or diaphoretic.   Cardiovascular:      Rate and Rhythm:  Normal rate.   Pulmonary:      Effort: Pulmonary effort is normal. No respiratory distress.   Abdominal:      Palpations: Abdomen is soft.   Neurological:      Mental Status: She is alert.                Results             Allergies:     Lisinopril, Penicillins, and Latex      THEODORO JUST, MD    [facility]:     Medication adherence and barriers to the treatment plan have been addressed. Opportunities to optimize healthy behaviors have been discussed. Patient / caregiver voiced understanding.

## 2024-02-27 NOTE — Progress Notes (Signed)
 [facility] ACUTE CARE AND GENERAL SURGERY CLINIC NOTE        Patient Name: Tracy Robles  Medical Record Number: [id number]  Date of Service: 02/29/2024    Reason for Visit: Follow up wound check        History of Present Illness    Tracy Robles 58 y.o. female PMHx of psoriasis, eczema, DM, HTN, and obesity who presented to the ED on 9/9 with acute onset epigastric pain and emesis. CTA abdomen demonstrating small bowel obstruction with transition point in the right lower quadrant. The patient was taken to the OR on 12/23/2023 for diagnostic laparoscopy with laparoscopic lysis of adhesions converted to open laparotomy with repair of small bowel perforations x2. Operative findings were adhesive band from the uterus adhered to the small bowel causing small bowel obstruction with resultant upstream dilation of bowel; two small bowel perforations noted in the jejunum requiring repair; no bowel resection needed.  She was discharged home on POD #6 in good condition.     She see seen again in clinic on 9/29 with complaints of breathlessness, incisional leakage, and severe abdominal pain. She was advised to present to the ER. She presented to the ED at [facility] and was admitted there. CT revealed no PE, but did show large hematoma. She was monitored with serial CBC, abdominal exams and there was some concern for potential developing fistula.    Seen again on 10/9 with some moderate improvement in her pain and drainage. Silver nitrate was used to treat her umbilical area.  Seen again on 10/21 and noted to be healing well, but declined further silver nitrate to area. She was last seen and discharged from general surgery clinic on 11/3.    Patient returns to clinic today, noting that she had a red spot near her umbilicus that has resolved. She reports she is still having some continued drainge from her umbilicus that is sometimes clear and sometimes brown, but very little in amount. She has burning pain at the site, especially when lying on either side, causing discomfort. The pain worsens when sitting up for extended periods, and she avoids pressure on the area. 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 She plans to start working out to build her strength and return to work as a midwife.    Reivew of Systems:  Negative except otherwise noted in the HPI.    Physical Exam:  Vitals:    02/29/24 1110   BP: 129/87   Pulse: 120   Temp: 36.2 ??C (97.1 ??F)   SpO2: 99%     Pulse on recheck 55 30 mins later    General: awake, alert, afebrile, in NAD  Neuro: Alert and oriented ??3  Lungs: Normal work of breathing on room air  Abdomen: Soft, obese non tender, non distended. Incision is mostly C/D/I with no erythema, edema or ecchymosis noted. Still has a very small area residual inside her umbilicus that has not healed completely. No further fibrinous exudate. No obvious drainage.          Assessment & Plan    Tracy Robles 58 y.o. female PMHx of morbid obesity, psoriasis, eczema, DM, HTN, and obesity who presented to the ED on 9/9 with SBO and underwent ex lap with repair of small bowel perforations x2. She  was subsequently readmitted at [facility] for postoperative hematoma.     On exam no signs or symptoms of overt infection is noted. Area appears similar to previous visit if not somewhat improved.  Discussed with the patient given continued drainage and pain will proceed with CT AP with PO + IV contrast. Our office will work on authorization and reach out to patient when ready to schedule. Area does not need to be packed and we discussed this. Follow up to be based on CT results.     If any new questions or concerns should arise to give the clinic a call, otherwise continue follow-up with their primary care physician.  Patient was agreeable to plan

## 2024-02-29 ENCOUNTER — Ambulatory Visit: Admit: 2024-02-29 | Discharge: 2024-03-01 | Payer: BLUE CROSS/BLUE SHIELD

## 2024-02-29 DIAGNOSIS — L409 Psoriasis, unspecified: Principal | ICD-10-CM

## 2024-02-29 DIAGNOSIS — Z5189 Encounter for other specified aftercare: Principal | ICD-10-CM

## 2024-02-29 DIAGNOSIS — N83201 Unspecified ovarian cyst, right side: Principal | ICD-10-CM

## 2024-02-29 DIAGNOSIS — N950 Postmenopausal bleeding: Principal | ICD-10-CM

## 2024-02-29 DIAGNOSIS — N9089 Other specified noninflammatory disorders of vulva and perineum: Principal | ICD-10-CM

## 2024-02-29 DIAGNOSIS — N83202 Unspecified ovarian cyst, left side: Principal | ICD-10-CM

## 2024-02-29 MED ORDER — NYSTATIN-TRIAMCINOLONE 100,000 UNIT/G-0.1 % TOPICAL CREAM
TOPICAL | 1 refills | 0.00000 days | Status: CP
Start: 2024-02-29 — End: 2025-02-28

## 2024-02-29 MED ORDER — COSENTYX PEN 300 MG/2 PENS (150 MG/ML) SUBCUTANEOUS PEN INJECTOR
SUBCUTANEOUS | 11 refills | 28.00000 days
Start: 2024-02-29 — End: ?

## 2024-02-29 NOTE — Telephone Encounter (Signed)
 Appointment on 11/19    Thanks,  [place]

## 2024-02-29 NOTE — Progress Notes (Signed)
 02/29/2024 - Clinical assessment date was due on 04/01/24. Reached out to Tracy Robles and got approval to proceed with scheduling the patients refill and to move the clinical assessment date to match the next refill coordination.     [facility] Refill Coordination Note    Specialty Medication(s) to be Shipped:   Inflammatory Disorders: Cosentyx     Other medication(s) to be shipped: No additional medications requested for fill at this time    Specialty Medications not needed at this time: N/A     Tracy Robles, DOB: 1965-08-21  Phone: There are no phone numbers on file.      All above HIPAA information was verified with patient.     Was a nurse, learning disability used for this call? No    Completed refill call assessment today to schedule patient's medication shipment from the [facility]  [phone]).  All relevant notes have been reviewed.     Specialty medication(s) and dose(s) confirmed: Regimen is correct and unchanged.   Changes to medications: Tracy Robles reports no changes at this time.  Changes to insurance: No  New side effects reported not previously addressed with a pharmacist or physician: None reported  Questions for the pharmacist: No    Confirmed patient received a Conservation Officer, Historic Buildings and a Surveyor, Mining with first shipment. The patient will receive a drug information handout for each medication shipped and additional FDA Medication Guides as required.       DISEASE/MEDICATION-SPECIFIC INFORMATION        For patients on injectable medications: Next injection is scheduled for 03/11/24.    SPECIALTY MEDICATION ADHERENCE     Medication Adherence    Patient reported X missed doses in the last month: 0  Specialty Medication: COSENTYX  PEN (2 PENS) 150 mg/mL Pnij injection (secukinumab )  Patient is on additional specialty medications: No              Were doses missed due to medication being on hold? No      COSENTYX  PEN (2 PENS) 150 mg/mL Pnij injection (secukinumab ): 0 doses of medicine on hand     REFERRAL TO PHARMACIST     Referral to the pharmacist: Not needed      [facility]     Shipping address confirmed in Epic.     Cost and Payment: Patient has a $0 copay, payment information is not required.    Delivery Scheduled: Yes, Expected medication delivery date: 03/03/24.  However, Rx request for refills was sent to the provider as there are none remaining.     Medication will be delivered via Same Day Courier to the prescription address in Epic WAM.    [facility]  Specialty Technician

## 2024-02-29 NOTE — Progress Notes (Signed)
 Outpatient Gynecology Note - New Tracy Robles    ASSESSMENT AND PLAN     Vulvar irritation  -     nystatin-triamcinolone  (MYCOLOG II) 100,000-0.1 unit/g-% cream; Apply to affected area 2 times daily  - Consider addition of vaginal estrogen    Bilateral ovarian cysts: We discussed management options for ovarian cyst to include: Expectant management with ultrasound surveillance v. Surgical intervention. Given persistent healing issues post op from recent surgery and lack of symptoms, recommend repeat imaging in 4 weeks for interval change  -     US  Endovaginal (Non-OB); Future  - Release of records for previous surgery requested    PMB (postmenopausal bleeding): Now resolved. Unclear etiology, endometrial thickness reassuring. We discussed endometrial biopsy should bleeding recur of if change in thickness on repeat imaging    SUBJECTIVE     This 58 y.o. No obstetric history on file. is a  GOG Tracy Robles who presents with complaints of PMB. Tracy Robles reports one month of dried spotting last month which has resolved. Tracy Robles denies anticoagulation or hormone use    In addition, Tracy Robles reports vulvovaginal irritation and itching which started in August. Tracy Robles denies new contacts to the area    Tracy Robles reports history of endometriosis diagnosed at the time of surgery in 1995    US  01/2024:  Impression      Bilateral hemorrhagic ovarian cystic lesions. These may represent endometriomas in this Tracy Robles with history of endometriosis  or the sequela of a recently documented larger  intraperitoneal hematoma seen on the recent CT. Attention on follow-up imaging.                  Please see below for data measurements:         LMP:   Endovaginal consent? As per sonographer, the endovaginal pelvic procedure was explained to the Tracy Robles and the Tracy Robles verbally consented to the exam.      Uterus: Sagittal 5.0 cm; AP 2.3 cm; Transverse 2.9 cm   Endometrium: 0.37 cm   Right ovary: Sagittal 5.1 cm; AP 6.1 cm; Transverse 5.4 cm   Left ovary: Sagittal 5.6 cm; AP 5.4 cm; Transverse 5.8 cm        Narrative   EXAM: US  ENDOVAGINAL (NON-OB)   ACCESSION: [id number]   REPORT DATE: 02/04/2024 2:15 PM      CLINICAL INDICATION: 58 years old with postmenopausal bleeding  - N95.0 - Post - menopausal bleeding     LMP:      COMPARISON: 01/12/2024 noncontrast CT of the abdomen and pelvis      TECHNIQUE: As per sonographer, the endovaginal pelvic procedure was explained to the Tracy Robles and the Tracy Robles verbally consented to the exam. Ultrasound views of the pelvis were obtained endovaginally using gray scale and color Doppler imaging. Spectral Doppler imaging was also performed.         FINDINGS:      UTERUS/CERVIX: The uterus was anteverted and measured 5.0 x 2.3 x 2.9 cm. No focal myometrial mass was seen. The endometrium was normal in thickness, measuring 0.37 cm. Nabothian cyst.      OVARIES: The ovaries were seen well transvaginally. Bilateral large cysts were seen within both ovaries. Appropriate arterial inflow and venous outflow of the ovaries was documented on color and spectral Doppler imaging. The right ovary measured 5.1 x 6.1 x 5.4 cm and the left ovary measured 5.6 x 5.4 x 5.8 cm. The largest well-marginated fluid collection on the right measures 4.5 x 4.3 x 3.4 cm while  that  on the left measures 4.7 x 4.3 x 3.4 cm.      OTHER: No abnormal pelvic free fluid.     Pap 05/2023: NILM HPV HR negative      OB History   Gravida Para Term Preterm AB Living     0      SAB IAB Ectopic Molar Multiple Live Births               Past Medical History[1]    Past Surgical History[2]    Short Social History[3]    Family History[4]    Current Medications[5]    Allergies[6]    REVIEW OF SYSTEMS: As per HPI    OBJECTIVE     BP 122/80  - Pulse 93  - Wt (!) 114.8 kg (253 lb)  - BMI 44.82 kg/m??   General: well-appearing and in NAD    Pelvic Exam:   EG/BUS:  Diffuse erythema without focal lesion  Vagina:  Atrophic mucosa without evidence of lesion  Cervix:   Normal  Uterus:  Normal size, ante position, mobile, non tender  Adnexa:  No fullness or tenderness  Pelvic Floor: Non tender    A chaperone was present for any sensitive portions of the exam (if performed) including but not limited to, breast and pelvic examination.      LABS     Results for orders placed or performed during the hospital encounter of 01/11/24   Influenza/ RSV/COVID PCR    Specimen: Nasopharyngeal Swab   Result Value Ref Range    SARS-CoV-2 PCR Negative Negative    Influenza A Negative Negative    Influenza B Negative Negative    RSV Negative Negative   Protime-INR   Result Value Ref Range    PT 15.0 (H) 9.9 - 12.6 sec    INR 1.32    Basic metabolic panel   Result Value Ref Range    Sodium 145 135 - 145 mmol/L    Potassium 4.4 3.4 - 4.8 mmol/L    Chloride 105 98 - 107 mmol/L    CO2 25.8 20.0 - 31.0 mmol/L    Anion Gap 14 5 - 14 mmol/L    BUN 9 9 - 23 mg/dL    Creatinine 9.24 9.44 - 1.02 mg/dL    BUN/Creatinine Ratio 12     eGFR CKD-EPI (2021) Female >90 >=60 mL/min/1.76m2    Glucose 101 70 - 179 mg/dL    Calcium 9.5 8.7 - 89.5 mg/dL   Pro-BNP   Result Value Ref Range    PRO-BNP 70.0 <=300.0 pg/mL   Hepatic function panel (LFT's)   Result Value Ref Range    Albumin 3.3 (L) 3.4 - 5.0 g/dL    Total Protein 7.9 5.7 - 8.2 g/dL    Total Bilirubin 0.6 0.3 - 1.2 mg/dL    Bilirubin, Direct 9.79 0.00 - 0.30 mg/dL    AST 22 <=65 U/L    ALT 21 10 - 49 U/L    Alkaline Phosphatase 63 46 - 116 U/L   hsTroponin I (single, no delta)   Result Value Ref Range    hsTroponin I <3 <=34 ng/L   Hemoglobin and Hematocrit   Result Value Ref Range    HGB 9.4 (L) 11.3 - 14.9 g/dL    HCT 69.9 (L) 65.9 - 44.0 %   Hemoglobin and Hematocrit   Result Value Ref Range    HGB 9.8 (L) 11.3 - 14.9 g/dL    HCT 71.1 (L)  34.0 - 44.0 %   Hemoglobin and Hematocrit   Result Value Ref Range    HGB 9.5 (L) 11.3 - 14.9 g/dL    HCT 71.8 (L) 65.9 - 44.0 %   Basic Metabolic Panel   Result Value Ref Range    Sodium 141 135 - 145 mmol/L    Potassium 3.7 3.4 - 4.8 mmol/L    Chloride 101 98 - 107 mmol/L    CO2 26.4 20.0 - 31.0 mmol/L    Anion Gap 14 5 - 14 mmol/L    BUN 8 (L) 9 - 23 mg/dL    Creatinine 9.21 9.44 - 1.02 mg/dL    BUN/Creatinine Ratio 10     eGFR CKD-EPI (2021) Female 89 >=60 mL/min/1.101m2    Glucose 104 70 - 179 mg/dL    Calcium 9.1 8.7 - 89.5 mg/dL   Hemoglobin and Hematocrit   Result Value Ref Range    HGB 9.4 (L) 11.3 - 14.9 g/dL    HCT 71.3 (L) 65.9 - 44.0 %   Hemoglobin and Hematocrit   Result Value Ref Range    HGB 9.5 (L) 11.3 - 14.9 g/dL    HCT 71.7 (L) 65.9 - 44.0 %   Basic Metabolic Panel   Result Value Ref Range    Sodium 143 135 - 145 mmol/L    Potassium 3.9 3.4 - 4.8 mmol/L    Chloride 102 98 - 107 mmol/L    CO2 27.1 20.0 - 31.0 mmol/L    Anion Gap 14 5 - 14 mmol/L    BUN 6 (L) 9 - 23 mg/dL    Creatinine 9.28 9.44 - 1.02 mg/dL    BUN/Creatinine Ratio 8     eGFR CKD-EPI (2021) Female >90 >=60 mL/min/1.29m2    Glucose 117 70 - 179 mg/dL    Calcium 9.0 8.7 - 89.5 mg/dL   ECG 12 Lead   Result Value Ref Range    EKG Systolic BP  mmHg    EKG Diastolic BP  mmHg    EKG Ventricular Rate 109 BPM    EKG Atrial Rate 109 BPM    EKG P-R Interval 158 ms    EKG QRS Duration 72 ms    EKG Q-T Interval 338 ms    EKG QTC Calculation 455 ms    EKG Calculated P Axis 44 degrees    EKG Calculated R Axis 20 degrees    EKG Calculated T Axis 51 degrees    QTC Fredericia 412 ms   POCT Glucose   Result Value Ref Range    Glucose, POC 85 70 - 179 mg/dL   POCT Glucose   Result Value Ref Range    Glucose, POC 88 70 - 179 mg/dL   POCT Glucose   Result Value Ref Range    Glucose, POC 74 70 - 179 mg/dL   POCT Glucose   Result Value Ref Range    Glucose, POC 105 70 - 179 mg/dL   POCT Glucose   Result Value Ref Range    Glucose, POC 111 70 - 179 mg/dL   POCT Glucose   Result Value Ref Range    Glucose, POC 107 70 - 179 mg/dL   POCT Glucose   Result Value Ref Range    Glucose, POC 155 70 - 179 mg/dL   POCT Glucose   Result Value Ref Range    Glucose, POC 114 70 - 179 mg/dL   POCT Glucose   Result Value Ref Range  Glucose, POC 124 70 - 179 mg/dL   CBC w/ Differential   Result Value Ref Range    WBC 9.3 3.6 - 11.2 10*9/L    RBC 3.58 (L) 3.95 - 5.13 10*12/L    HGB 10.3 (L) 11.3 - 14.9 g/dL    HCT 69.6 (L) 65.9 - 44.0 %    MCV 84.8 77.6 - 95.7 fL    MCH 28.8 25.9 - 32.4 pg    MCHC 34.0 32.0 - 36.0 g/dL    RDW 85.5 87.7 - 84.7 %    MPV 6.9 6.8 - 10.7 fL    Platelet 572 (H) 150 - 450 10*9/L    nRBC 0 <=4 /100 WBCs    Neutrophils % 67.6 %    Lymphocytes % 17.8 %    Monocytes % 10.3 %    Eosinophils % 3.5 %    Basophils % 0.8 %    Absolute Neutrophils 6.3 1.8 - 7.8 10*9/L    Absolute Lymphocytes 1.7 1.1 - 3.6 10*9/L    Absolute Monocytes 1.0 (H) 0.3 - 0.8 10*9/L    Absolute Eosinophils 0.3 0.0 - 0.5 10*9/L    Absolute Basophils 0.1 0.0 - 0.1 10*9/L            [1]   Past Medical History:  Diagnosis Date    Allergic Don't know    Penicillin    Arthritis 2011    Lower back    BMI 60.0-69.9, adult (CMS-HCC) 05/29/2021    CTS (carpal tunnel syndrome)     Diabetes mellitus (CMS-HCC)     Eczema     High blood pressure     Neuromuscular disorder (CMS-HCC)     Psoriasis    [2]   Past Surgical History:  Procedure Laterality Date    OTHER SURGICAL HISTORY  1995    Endometriosis    PR ENTEROTOMY,NON-DUOD,EXPLOR/BX/FB REMOVAL N/A 12/22/2023    Procedure: ENTEROTOMY, SM INTESTINE, OTHER THAN DUODENUM; FOR EXPL, BX, OR FOREIGN BODY REMOVAL;  Surgeon: Vicci Janas CROME., MD;  Location: OR [facility];  Service: Trauma    PR EXPLORATORY OF ABDOMEN N/A 12/22/2023    Procedure: EXPLORATORY LAPAROTOMY, EXPLORATORY CELIOTOMY WITH OR WITHOUT BIOPSY(S);  Surgeon: Vicci Janas CROME., MD;  Location: OR [facility];  Service: Trauma    PR LAP, SURG ENTEROLYSIS N/A 12/22/2023    Procedure: LAPAROSCOPY, SURGICAL, ENTEROLYSIS (FREEING OF INTESTINAL ADHESION) (SEPARATE PROCEDURE);  Surgeon: Vicci Janas CROME., MD;  Location: OR [facility];  Service: Trauma    PR LAP,DIAGNOSTIC ABDOMEN N/A 12/22/2023    Procedure: DIAGNOSTIC AND OR OPERATIVE LAPAROSCOPY;  Surgeon: Vicci Janas CROME., MD;  Location: OR [facility];  Service: Trauma   [3]   Social History  Tobacco Use    Smoking status: Never     Passive exposure: Current    Smokeless tobacco: Never   Vaping Use    Vaping status: Never Used   Substance Use Topics    Alcohol use: Yes     Alcohol/week: 1.0 standard drink of alcohol     Comment: Only on Saturday    Drug use: Never   [4]   Family History  Problem Relation Age of Onset    Diabetes Father     Hypertension Father     Kidney disease Father     Clotting disorder Father         Blood clot    Asthma Father     Diabetes Sister     Miscarriages / Stillbirths Sister  Hypertension Mother     Arthritis Mother     Dementia Mother     Alzheimer's disease Mother 73 - 61    Breast cancer Maternal Aunt     Stroke Maternal Grandmother     Miscarriages / Stillbirths Maternal Grandmother     Melanoma Neg Hx     Basal cell carcinoma Neg Hx     Squamous cell carcinoma Neg Hx     Ovarian cancer Neg Hx    [5]   Current Outpatient Medications   Medication Sig Dispense Refill    blood sugar diagnostic (GLUCOSE BLOOD) Strp Check blood sugar as directed once a day and for symptoms of high or low blood sugar. 100 strip 3    blood-glucose sensor Devi Use as directed 5 each 6    cetirizine  (ZYRTEC ) 5 MG tablet Take 1 tablet (5 mg total) by mouth once as needed.      diclofenac sodium (VOLTAREN) 1 % gel Apply 1 g topically.      empagliflozin  (JARDIANCE ) 10 mg tablet Take 1 tablet (10 mg total) by mouth daily. 90 tablet 3    loratadine (CLARITIN) 10 mg tablet Take 1 tablet (10 mg total) by mouth daily as needed for allergies.      LORazepam (ATIVAN) 0.5 MG tablet Take 1 tablet (0.5 mg total) by mouth once as needed for other (claustrophobia during MRI) for up to 1 dose. Take 1 pill 45 minutes before MRI, 2nd pill available during MRI if needed. 2 tablet 0    meloxicam  (MOBIC ) 15 MG tablet Take 1 tablet (15 mg total) by mouth daily as needed for pain. 30 tablet 2    multivit with min-folic acid (MULTIVITAMIN GUMMIES) 200 mcg Chew Chew 1 tablet daily.      ONETOUCH DELICA PLUS LANCET 33 gauge Misc CHECK BLOOD SUGAR AS DIRECTED ONCE A DAY AND FOR SYMPTOMS OF HIGH OR LOW BLOOD SUGAR      secukinumab  (COSENTYX  PEN, 2 PENS,) 150 mg/mL PnIj injection Inject the contents of 2 pens (300 mg total) under the skin every twenty-eight (28) days. 2 mL 11    simvastatin  (ZOCOR ) 20 MG tablet Take 1 tablet (20 mg total) by mouth nightly. 90 tablet 3    tirzepatide  (MOUNJARO ) 12.5 mg/0.5 mL PnIj Inject 12.5 mg under the skin every seven (7) days. 3 mL 4    topiramate  (TOPAMAX ) 25 MG tablet Take 1 tablet (25 mg total) by mouth nightly. 30 tablet 2    triamcinolone  (KENALOG ) 0.1 % cream Apply topically to active, raised areas twice daily as needed. 80 g 3    [Paused] blood-glucose meter kit Use as instructed (Tracy Robles not taking: Reported on 02/15/2024) 1 each 0    [Paused] losartan  (COZAAR ) 100 MG tablet Take 1 tablet (100 mg total) by mouth daily. (Tracy Robles taking differently: Take 1 tablet (100 mg total) by mouth Two (2) times a week.) 90 tablet 3    [Paused] spironolactone  (ALDACTONE ) 25 MG tablet Take 1 tablet (25 mg total) by mouth daily. (Tracy Robles not taking: Reported on 02/15/2024) 90 tablet 3     No current facility-administered medications for this visit.   [6]   Allergies  Allergen Reactions    Lisinopril Cough    Penicillins      Other reaction(s): UNSPECIFIED    Latex Rash     Other reaction(s): UNKNOWN    latex

## 2024-02-29 NOTE — Progress Notes (Deleted)
 Outpatient Gynecology Note - New Patient    ASSESSMENT AND PLAN     Problem List Items Addressed This Visit    None    No follow-ups on file.    Tracy Robles    SUBJECTIVE     This 58 y.o. No obstetric history on file. is a new [facility] patient who presents for a problem visit.    HPI:  Tracy Robles was referred by her PCP for postmenopausal vaginal bleeding. She has been having some vulvar irritation since August. She then had about a month of spotting starting 2 months ago after an abdominal surgery. She describes small amounts of dried brown blood on the toilet paper after wiping with some associated itching. Bleeding has since resolved but she continues to have bothersome vulvar itching, swelling, and burning with wiping. She has been wearing a panty liner as she feels that reducing moisture helps with the itching. She has some vaginal dryness but no recent changes in discharge. No new personal care products, not sexually active. She feels strongly that her ovaries are causing pain and other symptoms and expresses interest in an oophorectomy as she feels that this would be a permanent solution to her symptoms.     US  Endovaginal 02/04/24  Impression  Bilateral hemorrhagic ovarian cystic lesions. These may represent endometriomas in this patient with history of endometriosis  or the sequela of a recently documented larger  intraperitoneal hematoma seen on the recent CT. Attention on follow-up imaging  Please see below for data measurements:  LMP:  Endovaginal consent? As per sonographer, the endovaginal pelvic procedure was explained to the patient and the patient verbally consented to the exam.  Uterus: Sagittal 5.0 cm; AP 2.3 cm; Transverse 2.9 cm  Endometrium: 0.37 cm  Right ovary: Sagittal 5.1 cm; AP 6.1 cm; Transverse 5.4 cm  Left ovary: Sagittal 5.6 cm; AP 5.4 cm; Transverse 5.8 cm      GYN History:   Menstrual cycles: postmenopausal  Last Pap: 05/21/23   Abnormal Pap hx: 2  STI history: none endorsed  Sexually active: no    Sex partner(s): n/a  Contraceptive method: current abstinence     OB History   Gravida Para Term Preterm AB Living     0      SAB IAB Ectopic Molar Multiple Live Births               Past Medical History[1]    Past Surgical History[2]    Short Social History[3]    Family History[4]    Current Medications[5]    Allergies[6]    REVIEW OF SYSTEMS: See HPI; remaining balance of 10 systems are negative/non-contributory.    OBJECTIVE     There were no vitals taken for this visit.  General: well-appearing and in NAD  Lymph:  no clavicular, axillary or inguinal lymphadenopathy  Neck:  thyroid smooth, non-enlarged, without nodules  Breasts:  no nipple discharge, no suspicious masses   Abdomen:  soft, NT, ND, no organomegaly  Pelvic:  normal external genitalia, BUS negative, vagina and cervix are without gross lesions, uterus is normal size and non-tender, no CMT, no adnexal tenderness or palpable masses  Psych: pleasant and interactive, answers questions appropriately    A chaperone was present for any sensitive portions of the exam (if performed) including but not limited to, breast and pelvic examination.  If performed, consent was obtained from patient prior to the sensitive examination.  If medical students were involved, explicit additional consent was  obtained.     LABS     Results for orders placed or performed during the hospital encounter of 01/11/24   Influenza/ RSV/COVID PCR    Specimen: Nasopharyngeal Swab   Result Value Ref Range    SARS-CoV-2 PCR Negative Negative    Influenza A Negative Negative    Influenza B Negative Negative    RSV Negative Negative   Protime-INR   Result Value Ref Range    PT 15.0 (H) 9.9 - 12.6 sec    INR 1.32    Basic metabolic panel   Result Value Ref Range    Sodium 145 135 - 145 mmol/L    Potassium 4.4 3.4 - 4.8 mmol/L    Chloride 105 98 - 107 mmol/L    CO2 25.8 20.0 - 31.0 mmol/L    Anion Gap 14 5 - 14 mmol/L    BUN 9 9 - 23 mg/dL    Creatinine 9.24 9.44 - 1.02 mg/dL BUN/Creatinine Ratio 12     eGFR CKD-EPI (2021) Female >90 >=60 mL/min/1.64m2    Glucose 101 70 - 179 mg/dL    Calcium 9.5 8.7 - 89.5 mg/dL   Pro-BNP   Result Value Ref Range    PRO-BNP 70.0 <=300.0 pg/mL   Hepatic function panel (LFT's)   Result Value Ref Range    Albumin 3.3 (L) 3.4 - 5.0 g/dL    Total Protein 7.9 5.7 - 8.2 g/dL    Total Bilirubin 0.6 0.3 - 1.2 mg/dL    Bilirubin, Direct 9.79 0.00 - 0.30 mg/dL    AST 22 <=65 U/L    ALT 21 10 - 49 U/L    Alkaline Phosphatase 63 46 - 116 U/L   hsTroponin I (single, no delta)   Result Value Ref Range    hsTroponin I <3 <=34 ng/L   Hemoglobin and Hematocrit   Result Value Ref Range    HGB 9.4 (L) 11.3 - 14.9 g/dL    HCT 69.9 (L) 65.9 - 44.0 %   Hemoglobin and Hematocrit   Result Value Ref Range    HGB 9.8 (L) 11.3 - 14.9 g/dL    HCT 71.1 (L) 65.9 - 44.0 %   Hemoglobin and Hematocrit   Result Value Ref Range    HGB 9.5 (L) 11.3 - 14.9 g/dL    HCT 71.8 (L) 65.9 - 44.0 %   Basic Metabolic Panel   Result Value Ref Range    Sodium 141 135 - 145 mmol/L    Potassium 3.7 3.4 - 4.8 mmol/L    Chloride 101 98 - 107 mmol/L    CO2 26.4 20.0 - 31.0 mmol/L    Anion Gap 14 5 - 14 mmol/L    BUN 8 (L) 9 - 23 mg/dL    Creatinine 9.21 9.44 - 1.02 mg/dL    BUN/Creatinine Ratio 10     eGFR CKD-EPI (2021) Female 89 >=60 mL/min/1.61m2    Glucose 104 70 - 179 mg/dL    Calcium 9.1 8.7 - 89.5 mg/dL   Hemoglobin and Hematocrit   Result Value Ref Range    HGB 9.4 (L) 11.3 - 14.9 g/dL    HCT 71.3 (L) 65.9 - 44.0 %   Hemoglobin and Hematocrit   Result Value Ref Range    HGB 9.5 (L) 11.3 - 14.9 g/dL    HCT 71.7 (L) 65.9 - 44.0 %   Basic Metabolic Panel   Result Value Ref Range    Sodium 143 135 - 145 mmol/L  Potassium 3.9 3.4 - 4.8 mmol/L    Chloride 102 98 - 107 mmol/L    CO2 27.1 20.0 - 31.0 mmol/L    Anion Gap 14 5 - 14 mmol/L    BUN 6 (L) 9 - 23 mg/dL    Creatinine 9.28 9.44 - 1.02 mg/dL    BUN/Creatinine Ratio 8     eGFR CKD-EPI (2021) Female >90 >=60 mL/min/1.45m2    Glucose 117 70 - 179 mg/dL    Calcium 9.0 8.7 - 89.5 mg/dL   ECG 12 Lead   Result Value Ref Range    EKG Systolic BP  mmHg    EKG Diastolic BP  mmHg    EKG Ventricular Rate 109 BPM    EKG Atrial Rate 109 BPM    EKG P-R Interval 158 ms    EKG QRS Duration 72 ms    EKG Q-T Interval 338 ms    EKG QTC Calculation 455 ms    EKG Calculated P Axis 44 degrees    EKG Calculated R Axis 20 degrees    EKG Calculated T Axis 51 degrees    QTC Fredericia 412 ms   POCT Glucose   Result Value Ref Range    Glucose, POC 85 70 - 179 mg/dL   POCT Glucose   Result Value Ref Range    Glucose, POC 88 70 - 179 mg/dL   POCT Glucose   Result Value Ref Range    Glucose, POC 74 70 - 179 mg/dL   POCT Glucose   Result Value Ref Range    Glucose, POC 105 70 - 179 mg/dL   POCT Glucose   Result Value Ref Range    Glucose, POC 111 70 - 179 mg/dL   POCT Glucose   Result Value Ref Range    Glucose, POC 107 70 - 179 mg/dL   POCT Glucose   Result Value Ref Range    Glucose, POC 155 70 - 179 mg/dL   POCT Glucose   Result Value Ref Range    Glucose, POC 114 70 - 179 mg/dL   POCT Glucose   Result Value Ref Range    Glucose, POC 124 70 - 179 mg/dL   CBC w/ Differential   Result Value Ref Range    WBC 9.3 3.6 - 11.2 10*9/L    RBC 3.58 (L) 3.95 - 5.13 10*12/L    HGB 10.3 (L) 11.3 - 14.9 g/dL    HCT 69.6 (L) 65.9 - 44.0 %    MCV 84.8 77.6 - 95.7 fL    MCH 28.8 25.9 - 32.4 pg    MCHC 34.0 32.0 - 36.0 g/dL    RDW 85.5 87.7 - 84.7 %    MPV 6.9 6.8 - 10.7 fL    Platelet 572 (H) 150 - 450 10*9/L    nRBC 0 <=4 /100 WBCs    Neutrophils % 67.6 %    Lymphocytes % 17.8 %    Monocytes % 10.3 %    Eosinophils % 3.5 %    Basophils % 0.8 %    Absolute Neutrophils 6.3 1.8 - 7.8 10*9/L    Absolute Lymphocytes 1.7 1.1 - 3.6 10*9/L    Absolute Monocytes 1.0 (H) 0.3 - 0.8 10*9/L    Absolute Eosinophils 0.3 0.0 - 0.5 10*9/L    Absolute Basophils 0.1 0.0 - 0.1 10*9/L     *Note: Due to a large number of results and/or encounters for the requested time period, some results have not been  displayed. A complete set of results can be found in Results Review.              [1]   Past Medical History:  Diagnosis Date    Allergic Don't know    Penicillin    Arthritis 2011    Lower back    BMI 60.0-69.9, adult (CMS-HCC) 05/29/2021    CTS (carpal tunnel syndrome)     Diabetes mellitus (CMS-HCC)     Eczema     High blood pressure     Neuromuscular disorder (CMS-HCC)     Psoriasis    [2]   Past Surgical History:  Procedure Laterality Date    OTHER SURGICAL HISTORY  1995    Endometriosis    PR ENTEROTOMY,NON-DUOD,EXPLOR/BX/FB REMOVAL N/A 12/22/2023    Procedure: ENTEROTOMY, SM INTESTINE, OTHER THAN DUODENUM; FOR EXPL, BX, OR FOREIGN BODY REMOVAL;  Surgeon: Vicci Janas CROME., MD;  Location: OR [facility];  Service: Trauma    PR EXPLORATORY OF ABDOMEN N/A 12/22/2023    Procedure: EXPLORATORY LAPAROTOMY, EXPLORATORY CELIOTOMY WITH OR WITHOUT BIOPSY(S);  Surgeon: Vicci Janas CROME., MD;  Location: OR [facility];  Service: Trauma    PR LAP, SURG ENTEROLYSIS N/A 12/22/2023    Procedure: LAPAROSCOPY, SURGICAL, ENTEROLYSIS (FREEING OF INTESTINAL ADHESION) (SEPARATE PROCEDURE);  Surgeon: Vicci Janas CROME., MD;  Location: OR [facility];  Service: Trauma    PR LAP,DIAGNOSTIC ABDOMEN N/A 12/22/2023    Procedure: DIAGNOSTIC AND OR OPERATIVE LAPAROSCOPY;  Surgeon: Vicci Janas CROME., MD;  Location: OR [facility];  Service: Trauma   [3]   Social History  Tobacco Use    Smoking status: Never     Passive exposure: Current    Smokeless tobacco: Never   Vaping Use    Vaping status: Never Used   Substance Use Topics    Alcohol use: Yes     Alcohol/week: 1.0 standard drink of alcohol     Comment: Only on Saturday    Drug use: Never   [4]   Family History  Problem Relation Age of Onset    Diabetes Father     Hypertension Father     Kidney disease Father     Clotting disorder Father         Blood clot    Asthma Father     Diabetes Sister     Miscarriages / Stillbirths Sister     Hypertension Mother     Arthritis Mother     Dementia Mother     Alzheimer's disease Mother 30 - 12    Breast cancer Maternal Aunt     Stroke Maternal Grandmother     Miscarriages / Stillbirths Maternal Grandmother     Melanoma Neg Hx     Basal cell carcinoma Neg Hx     Squamous cell carcinoma Neg Hx     Ovarian cancer Neg Hx    [5]   Current Outpatient Medications   Medication Sig Dispense Refill    blood sugar diagnostic (GLUCOSE BLOOD) Strp Check blood sugar as directed once a day and for symptoms of high or low blood sugar. 100 strip 3    [Paused] blood-glucose meter kit Use as instructed (Patient not taking: Reported on 02/15/2024) 1 each 0    blood-glucose sensor Devi Use as directed 5 each 6    cetirizine  (ZYRTEC ) 5 MG tablet Take 1 tablet (5 mg total) by mouth once as needed.      diclofenac sodium (VOLTAREN) 1 % gel Apply 1 g topically.  empagliflozin  (JARDIANCE ) 10 mg tablet Take 1 tablet (10 mg total) by mouth daily. 90 tablet 3    loratadine (CLARITIN) 10 mg tablet Take 1 tablet (10 mg total) by mouth daily as needed for allergies.      LORazepam (ATIVAN) 0.5 MG tablet Take 1 tablet (0.5 mg total) by mouth once as needed for other (claustrophobia during MRI) for up to 1 dose. Take 1 pill 45 minutes before MRI, 2nd pill available during MRI if needed. 2 tablet 0    [Paused] losartan  (COZAAR ) 100 MG tablet Take 1 tablet (100 mg total) by mouth daily. (Patient taking differently: Take 1 tablet (100 mg total) by mouth Two (2) times a week.) 90 tablet 3    meloxicam  (MOBIC ) 15 MG tablet Take 1 tablet (15 mg total) by mouth daily as needed for pain. 30 tablet 2    multivit with min-folic acid (MULTIVITAMIN GUMMIES) 200 mcg Chew Chew 1 tablet daily.      ONETOUCH DELICA PLUS LANCET 33 gauge Misc CHECK BLOOD SUGAR AS DIRECTED ONCE A DAY AND FOR SYMPTOMS OF HIGH OR LOW BLOOD SUGAR      secukinumab  (COSENTYX  PEN, 2 PENS,) 150 mg/mL PnIj injection Inject the contents of 2 pens (300 mg total) under the skin every twenty-eight (28) days. 2 mL 11    simvastatin  (ZOCOR ) 20 MG tablet Take 1 tablet (20 mg total) by mouth nightly. 90 tablet 3    [Paused] spironolactone  (ALDACTONE ) 25 MG tablet Take 1 tablet (25 mg total) by mouth daily. (Patient not taking: Reported on 02/15/2024) 90 tablet 3    tirzepatide  (MOUNJARO ) 12.5 mg/0.5 mL PnIj Inject 12.5 mg under the skin every seven (7) days. 3 mL 4    topiramate  (TOPAMAX ) 25 MG tablet Take 1 tablet (25 mg total) by mouth nightly. 30 tablet 2    triamcinolone  (KENALOG ) 0.1 % cream Apply topically to active, raised areas twice daily as needed. 80 g 3     No current facility-administered medications for this visit.   [6]   Allergies  Allergen Reactions    Lisinopril Cough    Penicillins      Other reaction(s): UNSPECIFIED    Latex Rash     Other reaction(s): UNKNOWN    latex

## 2024-02-29 NOTE — Telephone Encounter (Signed)
 Pt needs an appointment for med refill please. Last seen in office on 01/13/2023.

## 2024-03-02 DIAGNOSIS — Z79899 Other long term (current) drug therapy: Principal | ICD-10-CM

## 2024-03-02 DIAGNOSIS — L409 Psoriasis, unspecified: Principal | ICD-10-CM

## 2024-03-02 MED ORDER — COSENTYX UNOREADY PEN 300 MG/2 ML SUBCUTANEOUS PEN INJECTOR
SUBCUTANEOUS | 11 refills | 0.00000 days | Status: CP
Start: 2024-03-02 — End: ?
  Filled 2024-03-03: qty 2, 28d supply, fill #0

## 2024-03-02 MED ORDER — COSENTYX PEN 300 MG/2 PENS (150 MG/ML) SUBCUTANEOUS PEN INJECTOR
SUBCUTANEOUS | 11 refills | 28.00000 days | Status: CN
Start: 2024-03-02 — End: ?

## 2024-03-02 NOTE — Progress Notes (Signed)
 I discussed this patient's case including the history, clinical findings and assessment and plan with the resident physician.  I reviewed the resident???s note.  I agree with the plan as documented in the resident???s note.  I was available.    Rory Percy, MD

## 2024-03-02 NOTE — Patient Instructions (Signed)
 Meet your team:     Your intake nurse is: Cicero Duck    Please remember to fill out the survey you will receive after your visit. Your comments help [place] continue to improve our care.      Thanks in advance!      [facility] D[facility]at[facility] Clinical Staff

## 2024-03-02 NOTE — Progress Notes (Signed)
 Tracy Robles 's COSENTYX  PEN (2 PENS) 150 mg/mL Pnij injection (secukinumab ) shipment will be canceled as a result of refills denied by provider     I have reached out to the patient  at [phone] and communicated the delay. We will wait for a call back from the patient to reschedule the delivery.  We have canceled this work request.

## 2024-03-02 NOTE — Progress Notes (Signed)
 Dermatology Note     Assessment and Plan:    Psoriasis, chronic, well controlled on Cosentyx , BSA <5%  - Psoriasis previously flaring but now well controlled  - Previously discussed augmenting Cosentyx  with UV therapy but unable to make frequent appointments due to job limitations. Methotrexate is not a viable option due to daily alcohol use and baseline creatinine elevations.   - Taltz  was previously prescribed by Dr. Buren and patient reported that Rx was denied by insurance, so she has been taking Cosentyx  300 mg every month  - Continue Cosentyx  300mg  monthly   - Proper administration, adverse effects and what to expect were reviewed with patient  - Continue triamcinolone  (KENALOG ) 0.1% cream. Apply topically to active, raised areas twice daily PRN  - Evaluated for joint/back pain by Rheumatology 12/09/22- no concern for psoriatic arthritis at that time    High risk medication use (Cosentyx )  - TB test neg (01/2023)-repeat today    The patient was advised to call for an appointment should any new, changing, or symptomatic lesions develop.     RTC: Return in about 1 year (around 03/02/2025) for psoriasis. or sooner as needed   _________________________________________________________________      Chief Complaint     Chief Complaint   Patient presents with    Psoriasis     Pt stated flare up coming and going. Currently legs are flared up.        HPI     History of Present Illness  Tracy Robles is a 58 year old female with psoriasis who presents for a medication refill. She is accompanied by her mother.    She requires an annual appointment for her psoriasis medication refill due to insurance policies. Her psoriasis is generally well-controlled, but she experiences occasional flares. During these flares, she uses topical treatments, which she finds not very effective. She administers her psoriasis injection every 28 days, using two pens of 150 mg each, and tolerates the injections well.    She has a past surgical history of scar tissue removal from her small intestines. Following the surgery, she developed a rash that was itchy and resulted in dark pigmentation on her skin, particularly on her arms and legs. She used a cream to alleviate the symptoms.        Pertinent Past Medical History   No history of skin cancer  Diabetes  Psoriasis with consistent biopsy on 01/12/2018  Onychomycosis with clipping consistent from 11/09/2018     Family History:   Negative for melanoma     Social History:  She is a bus hospital doctor for town of [place]  Husband passed away suddenly in December 09, 2019    Past Medical History, Family History, Social History, Medication List, Allergies, and Problem List were reviewed in the rooming section of Epic.     ROS: Other than symptoms mentioned in the HPI, no fevers, chills, or other skin complaints    Physical Examination     GENERAL: Well-appearing female in no acute distress, resting comfortably.  NEURO: Alert and oriented, answers questions appropriately  PSYCH: Normal mood and affect  RESP: No increased work of breathing  SKIN (Sun exposed Exam): Per patient request, examination of the face, neck, scalp, bilateral upper extremities, palms, and fingernails was performed as well as distal lower extremities bilaterally   - Skin otherwise clear  All areas not commented on are within normal limits or unremarkable    (Approved Template 12/26/2019)

## 2024-03-03 DIAGNOSIS — R188 Other ascites: Principal | ICD-10-CM

## 2024-03-03 NOTE — Progress Notes (Addendum)
 Clinical Assessment Needed For: Formulation Change  Medication: COSENTYX  UNOREADY PEN 300 mg/2 mL Pnij (secukinumab )  Last Fill Date/Day Supply: 02/05/24 / 28 days  Copay $4  Was previous dose already scheduled to fill: Yes     Notes to Pharmacist: old dose was scheduled for today

## 2024-03-03 NOTE — Progress Notes (Signed)
 Abstraction Result Flowsheet Data    This patient's last AWV date: : Not Found  This patients last WCC/CPE date: : 04/10/2023      Reason for Encounter  Reason for Encounter: Outreach  Primary Reason for Outreach: Informational Request  Text Message: No  MyChart Message: No  Outreach Call Outcome: Other (Verify appt date/time)

## 2024-03-03 NOTE — Progress Notes (Signed)
 [facility] Pharmacist has reviewed a new prescription for Cosentyx  that indicates a pen adjustment - from 2 pens to 1 pen (same dose of 300 mg total).   I called to verify patient was aware of this change - she is.  I reviewed that medication has shipped for delivery this afternoon as originally planned.          [facility] Clinical Assessment & Refill Coordination Note    Tracy Robles, Tracy Robles: 1965/07/15  Phone: There are no phone numbers on file.    All above HIPAA information was verified with patient.     Was a nurse, learning disability used for this call? No    Specialty Medication(s):   Inflammatory Disorders: Cosentyx      Current Medications[1]     Changes to medications: Tracy Robles reports no changes at this time.    Medication list has been reviewed and updated in Epic: Yes    Allergies[2]    Changes to allergies: No    Allergies have been reviewed and updated in Epic: Yes    SPECIALTY MEDICATION ADHERENCE     Cosentyx  300 mg: 0 doses of medicine on hand   Medication Adherence    Patient reported X missed doses in the last month: 0  Specialty Medication: Cosentyx           Specialty medication(s) dose(s) confirmed: Reviewed pen adjustment - as above     Are there any concerns with adherence? No    Adherence counseling provided? Not needed    CLINICAL MANAGEMENT AND INTERVENTION      Clinical Benefit Assessment:    Do you feel the medicine is effective or helping your condition? Yes    Clinical Benefit counseling provided? Not needed    Adverse Effects Assessment:    Are you experiencing any side effects? No    Are you experiencing difficulty administering your medicine? No    Quality of Life Assessment:    Quality of Life    Rheumatology  Oncology  Dermatology  1. What impact has your specialty medication had on the symptoms of your skin condition (i.e. itchiness, soreness, stinging)?: Some  2. What impact has your specialty medication had on your comfort level with your skin?: Some  Cystic Fibrosis How many days over the past month did your psoriasis  keep you from your normal activities? For example, brushing your teeth or getting up in the morning. Patient declined to answer    Have you discussed this with your provider? Not needed    Acute Infection Status:    Acute infections noted within Epic:  No active infections    Patient reported infection: None    Therapy Appropriateness:    Is the medication and dose appropriate considering the patient???s diagnosis, treatment, and disease journey, comorbidities, medical history, current medications, allergies, therapeutic goals, self-administration ability, and access barriers? Yes, therapy is appropriate and should be continued     Clinical Intervention:    Was an intervention completed as part of this clinical assessment? No    DISEASE/MEDICATION-SPECIFIC INFORMATION      For patients on injectable medications: Next injection is scheduled for 11/28.    Chronic Inflammatory Diseases: Have you experienced any flares in the last month? Yes, minor  Has this been reported to your provider? Yes, had visit yesterday    PATIENT SPECIFIC NEEDS     Does the patient have any physical, cognitive, or cultural barriers? No    Is the patient high risk?  No    Does the patient require physician intervention or other additional services (i.e., nutrition, smoking cessation, social work)? No    Does the patient have an additional or emergency contact listed in their chart? Yes    SOCIAL DETERMINANTS OF HEALTH     At the [facility], we have learned that life circumstances - like trouble affording food, housing, utilities, or transportation can affect the health of many of our patients.   That is why we wanted to ask: are you currently experiencing any life circumstances that are negatively impacting your health and/or quality of life? Patient declined to answer    Social Drivers of Health     Food Insecurity: No Food Insecurity (12/23/2023)    Hunger Vital Sign     Worried About Running Out of Food in the Last Year: Never true     Ran Out of Food in the Last Year: Never true   Tobacco Use: Medium Risk (02/29/2024)    Patient History     Smoking Tobacco Use: Never     Smokeless Tobacco Use: Never     Passive Exposure: Current   Transportation Needs: No Transportation Needs (12/23/2023)    PRAPARE - Transportation     Lack of Transportation (Medical): No     Lack of Transportation (Non-Medical): No   Alcohol Use: Not on file   Housing: Low Risk (12/23/2023)    Housing     Within the past 12 months, have you ever stayed: outside, in a car, in a tent, in an overnight shelter, or temporarily in someone else's home (i.e. couch-surfing)?: No     Are you worried about losing your housing?: No   Physical Activity: Not on file   Utilities: Low Risk (04/04/2023)    Utilities     Within the past 12 months, have you been unable to get utilities (heat, electricity) when it was really needed?: No   Stress: Not on file   Interpersonal Safety: Not At Risk (01/11/2024)    Interpersonal Safety     Unsafe Where You Currently Live: No     Physically Hurt by Anyone: No     Abused by Anyone: No   Substance Use: Not on file (02/18/2023)   Intimate Partner Violence: Not on file   Social Connections: Not on file   Financial Resource Strain: Low Risk (12/23/2023)    Overall Financial Resource Strain (CARDIA)     Difficulty of Paying Living Expenses: Not hard at all   Health Literacy: Not on file   Internet Connectivity: Internet connectivity concern unknown (04/04/2023)    Internet Connectivity     Do you have access to internet services: Yes     How do you connect to the internet: Not on file     Is your internet connection strong enough for you to watch video on your device without major problems?: Not on file     Do you have enough data to get through the month?: Not on file     Does at least one of the devices have a camera that you can use for video chat?: Not on file       Would you be willing to receive help with any of the needs that you have identified today? Not applicable       SHIPPING     Specialty Medication(s) to be Shipped:   Inflammatory Disorders: NA - delivery previously scheduled    Other medication(s) to be shipped: No  additional medications requested for fill at this time    Specialty Medications not needed at this time: N/A     Changes to insurance: No    Cost and Payment: na    Delivery Scheduled: na - delivery already scheduled in previous encounter     Medication will be delivered via na to the confirmed na address in [facility].    The patient will receive a drug information handout for each medication shipped and additional FDA Medication Guides as required.  Verified that patient has previously received a Conservation Officer, Historic Buildings and a Surveyor, Mining.    The patient or caregiver noted above participated in the development of this care plan and knows that they can request review of or adjustments to the care plan at any time.      All of the patient's questions and concerns have been addressed.    [facility] Specialty Pharmacist           [1]   Current Outpatient Medications   Medication Sig Dispense Refill    blood sugar diagnostic (GLUCOSE BLOOD) Strp Check blood sugar as directed once a day and for symptoms of high or low blood sugar. 100 strip 3    blood-glucose sensor Devi Use as directed 5 each 6    cetirizine  (ZYRTEC ) 5 MG tablet Take 1 tablet (5 mg total) by mouth once as needed.      diclofenac sodium (VOLTAREN) 1 % gel Apply 1 g topically.      empagliflozin  (JARDIANCE ) 10 mg tablet Take 1 tablet (10 mg total) by mouth daily. 90 tablet 3    loratadine (CLARITIN) 10 mg tablet Take 1 tablet (10 mg total) by mouth daily as needed for allergies.      LORazepam (ATIVAN) 0.5 MG tablet Take 1 tablet (0.5 mg total) by mouth once as needed for other (claustrophobia during MRI) for up to 1 dose. Take 1 pill 45 minutes before MRI, 2nd pill available during MRI if needed. 2 tablet 0    meloxicam  (MOBIC ) 15 MG tablet Take 1 tablet (15 mg total) by mouth daily as needed for pain. 30 tablet 2    multivit with min-folic acid (MULTIVITAMIN GUMMIES) 200 mcg Chew Chew 1 tablet daily.      ONETOUCH DELICA PLUS LANCET 33 gauge Misc CHECK BLOOD SUGAR AS DIRECTED ONCE A DAY AND FOR SYMPTOMS OF HIGH OR LOW BLOOD SUGAR      secukinumab  (COSENTYX  PEN, 2 PENS,) 150 mg/mL PnIj injection Inject the contents of 2 pens (300 mg total) under the skin every twenty-eight (28) days. 2 mL 11    simvastatin  (ZOCOR ) 20 MG tablet Take 1 tablet (20 mg total) by mouth nightly. 90 tablet 3    tirzepatide  (MOUNJARO ) 12.5 mg/0.5 mL PnIj Inject 12.5 mg under the skin every seven (7) days. 3 mL 4    topiramate  (TOPAMAX ) 25 MG tablet Take 1 tablet (25 mg total) by mouth nightly. 30 tablet 2    triamcinolone  (KENALOG ) 0.1 % cream Apply topically to active, raised areas twice daily as needed. 80 g 3    [Paused] blood-glucose meter kit Use as instructed (Patient not taking: Reported on 02/15/2024) 1 each 0    [Paused] losartan  (COZAAR ) 100 MG tablet Take 1 tablet (100 mg total) by mouth daily. (Patient taking differently: Take 1 tablet (100 mg total) by mouth Two (2) times a week.) 90 tablet 3  nystatin-triamcinolone  (MYCOLOG II) 100,000-0.1 unit/g-% cream Apply to affected area 2 times daily 30 g 1    secukinumab  (COSENTYX  UNOREADY PEN) 300 mg/2 mL PnIj Inject the contents of 1 pen (300 mg) under the skin every twenty-eight (28) days. 2 mL 11    [Paused] spironolactone  (ALDACTONE ) 25 MG tablet Take 1 tablet (25 mg total) by mouth daily. (Patient not taking: Reported on 02/15/2024) 90 tablet 3     No current facility-administered medications for this visit.   [2]   Allergies  Allergen Reactions    Lisinopril Cough    Penicillins      Other reaction(s): UNSPECIFIED    Latex Rash     Other reaction(s): UNKNOWN    latex

## 2024-03-09 ENCOUNTER — Emergency Department
Admit: 2024-03-09 | Discharge: 2024-03-10 | Disposition: A | Discharge status: Home / Self Care | Payer: BLUE CROSS/BLUE SHIELD | Attending: Emergency Medicine

## 2024-03-09 DIAGNOSIS — T148XXA Other injury of unspecified body region, initial encounter: Principal | ICD-10-CM

## 2024-03-09 MED ADMIN — iohexol (OMNIPAQUE) 350 mg iodine/mL solution 100 mL: 100 mL | INTRAVENOUS | @ 21:00:00 | Stop: 2024-03-09

## 2024-03-09 MED ADMIN — iohexol (OMNIPAQUE) 240 mg iodine/mL oral solution 50 mL: 50 mL | ORAL | @ 20:00:00 | Stop: 2024-03-09

## 2024-03-09 NOTE — ED Provider Notes (Signed)
 [facility]  Emergency Department Provider Note     ED Clinical Impression     Final diagnoses:   Hematoma (Primary)       Medical Decision Making, ED Course     BP 155/92  - Pulse 92  - Temp 36.9 ??C (98.4 ??F) (Oral)  - Resp 20  - Wt (!) 116.8 kg (257 lb 8 oz)  - SpO2 96%  - BMI 45.61 kg/m??     Initial Clinical Impression/DDX/Medical Decision Making    Medical Decision Making  A 58 year old female presented to the Emergency Department with concerns of persistent serous drainage, mild pain, and localized redness at a postoperative wound site following recent surgery. She denied fever, chills, or purulent discharge. Examination and reviewed CT imaging showed no evidence of abscess, fistula, or infection, and the wound appeared to be healing appropriately with some residual hematoma noted to be improving.    Differential diagnosis includes, but is not limited to:  - Postoperative wound infection/abscess: Considered due to ongoing drainage and localized pain/redness, but excluded based on normal CT findings, absence of fever, and lack of purulent discharge.  - Normal postoperative healing with serous drainage: Supported by exam and imaging findings, with drainage and mild pain attributed to dissolving internal stitches and expected healing process.    Postoperative wound with serous drainage and pain  - Advised to leave the dressing off the wound to allow it to air out.  - Advised to reapply a bandage if drainage increases.  - Advised to keep the wound clean and dry.  - Discharged home.       ED Course:          HPI, Physical Exam     HPI:  March 09, 2024 5:38 PM   History of Present Illness  Tracy Robles is a 58 year old female who presents with postoperative discharge and soreness following recent surgery.    She experiences clear discharge from the site of her recent surgery, which has been persistent. She had a CT scan performed today after which she was told by the technician that she needed to be rushed to the emergency department.    The surgical site remains sore with a red spot. The redness has been present but has cleared up in other areas previously. She experiences itching at the site, which she associates with healing, and has been diligent in keeping the area clean to prevent crusting.    The discharge sometimes runs down her stomach, leading her to apply a bandage. No fever or chills. She reports some pain at the surgical site, particularly when pressure is applied, which affects her ability to sleep on her stomach, her preferred sleeping position. She has been sleeping on her back instead, which she finds uncomfortable.    Physical Exam:   Physical Exam       Constitutional: Alert and oriented. No acute distress.  HEENT: Normocephalic and atraumatic. Conjunctivae clear. No congestion. Moist mucous membranes.   CV: Normal S1, S2 no murmur  GI: Non-distended; soft, localized erythema with no fluctuance/induration, well-healing surgical site with minimal serous drainage.  MSK: Moving extremities equally  Neuro: Normal speech and language. Patient is moving all extremities equally, face is symmetric at rest and with speech.  Skin: Skin is warm, dry and intact. No rash noted.    Vitals:    03/09/24 1637   BP: 155/92   Pulse: 92   Resp: 20   Temp: 36.9 ??C (98.4 ??F)  TempSrc: Oral   SpO2: 96%   Weight: (!) 116.8 kg (257 lb 8 oz)         Discussion of Management with other Physicians, QHP or Appropriate Source: If applicable, as documented in ED course above  Independent Interpretation of Studies: If applicable, documented in ED course above.  I have reviewed recent and relevant previous record, including: If applicable, inpatient/outpatient notes and prior studies, documented in Impression/MDM          ____________________________________________    The case was discussed with the attending physician, who is in agreement with the above assessment and plan.     Additional History Elements     Chief Complaint  Chief Complaint   Patient presents with    Evaluation of Abnormal Diagnostic Test         Past Medical History[1]    Past Surgical History[2]    Allergies  Lisinopril, Penicillins, and Latex    Family History  Family History[3]    Social History  Short Social History[4]     Radiology     No orders to display       Pertinent labs & imaging results that were available during my care of the patient were independently interpreted by me and considered in my medical decision making (see chart for details).    Portions of this record have been created using Scientist, clinical (histocompatibility and immunogenetics). Dictation errors have been sought, but may not have been identified and corrected.       [1]   Past Medical History:  Diagnosis Date    Allergic Don't know    Penicillin    Arthritis 2011    Lower back    BMI 60.0-69.9, adult (CMS-HCC) 05/29/2021    CTS (carpal tunnel syndrome)     Diabetes mellitus (CMS-HCC)     Eczema     High blood pressure     Neuromuscular disorder (CMS-HCC)     Psoriasis    [2]   Past Surgical History:  Procedure Laterality Date    OTHER SURGICAL HISTORY  1995    Endometriosis    PR ENTEROTOMY,NON-DUOD,EXPLOR/BX/FB REMOVAL N/A 12/22/2023    Procedure: ENTEROTOMY, SM INTESTINE, OTHER THAN DUODENUM; FOR EXPL, BX, OR FOREIGN BODY REMOVAL;  Surgeon: Vicci Janas CROME., MD;  Location: OR [facility];  Service: Trauma    PR EXPLORATORY OF ABDOMEN N/A 12/22/2023    Procedure: EXPLORATORY LAPAROTOMY, EXPLORATORY CELIOTOMY WITH OR WITHOUT BIOPSY(S);  Surgeon: Vicci Janas CROME., MD;  Location: OR [facility];  Service: Trauma    PR LAP, SURG ENTEROLYSIS N/A 12/22/2023    Procedure: LAPAROSCOPY, SURGICAL, ENTEROLYSIS (FREEING OF INTESTINAL ADHESION) (SEPARATE PROCEDURE);  Surgeon: Vicci Janas CROME., MD;  Location: OR [facility];  Service: Trauma    PR LAP,DIAGNOSTIC ABDOMEN N/A 12/22/2023    Procedure: DIAGNOSTIC AND OR OPERATIVE LAPAROSCOPY;  Surgeon: Vicci Janas CROME., MD;  Location: OR [facility];  Service: Trauma   [3]   Family History  Problem Relation Age of Onset    Diabetes Father     Hypertension Father     Kidney disease Father     Clotting disorder Father         Blood clot    Asthma Father     Diabetes Sister     Miscarriages / Stillbirths Sister     Hypertension Mother     Arthritis Mother     Dementia Mother     Alzheimer's disease Mother 17 - 52    Breast cancer Maternal Aunt  Stroke Maternal Grandmother     Miscarriages / Stillbirths Maternal Grandmother     Melanoma Neg Hx     Basal cell carcinoma Neg Hx     Squamous cell carcinoma Neg Hx     Ovarian cancer Neg Hx    [4]   Social History  Tobacco Use    Smoking status: Never     Passive exposure: Current    Smokeless tobacco: Never   Vaping Use    Vaping status: Never Used   Substance Use Topics    Alcohol use: Not Currently     Comment: Only on Saturday    Drug use: Never        Audry Monico CROME, MD  Resident  03/09/24 1750

## 2024-03-09 NOTE — Telephone Encounter (Signed)
 Pharmacy called regarding price concerns of medication of nystatin  and triamcinolone  cream and that it would be cheaper to order creams separately and have patient mix them together. Chart routed to provider to review medications.

## 2024-03-09 NOTE — Telephone Encounter (Signed)
 Patient Pharmacy called nurse advice line regarding prescription clarifications. Pharmacy needed clarification on location where nystatin  cream needed to be applied. Verified application location with pharmacy. All questions answered.

## 2024-03-09 NOTE — ED Triage Note (Addendum)
 Pt had a surgery in September. Pt had a CT done today due to ongoing drainage from an incision near her umbilicus. Pt stating it is draining clear. No fevers. CT tech brought patient her due to possible abnormalities on the scan. Pt is not sure why she is here.     Pt stating I want to go home several times in triage. I explained to patient she was free to go home anytime and asked if she wanted to be evaluated in the ED to see what was going on. Pt stating she did want to be seen.

## 2024-03-10 LAB — QUANTIFERON TB GOLD PLUS
QUANTIFERON MITOGEN VALUE: >10 [IU]/mL
QUANTIFERON NIL VALUE: 0.01 [IU]/mL
QUANTIFERON TB GOLD: NEGATIVE
QUANTIFERON TB1 AG VALUE: 0.02 [IU]/mL
QUANTIFERON TB2 AG VALUE: 0.03 [IU]/mL

## 2024-03-16 MED ORDER — NYSTATIN 100,000 UNIT/GRAM TOPICAL POWDER
TOPICAL | 0 refills | 0.00000 days | Status: CP
Start: 2024-03-16 — End: 2025-03-16
  Filled 2024-03-22: qty 15, 30d supply, fill #0

## 2024-03-16 MED ORDER — TRIAMCINOLONE ACETONIDE 0.1 % TOPICAL CREAM
INTRAMUSCULAR | 3 refills | 0.00000 days | Status: CP
Start: 2024-03-16 — End: ?
  Filled 2024-03-22: qty 80, 30d supply, fill #0

## 2024-03-16 NOTE — Addendum Note (Signed)
 Addended by: ARLEEN ZACHARY BRAVO on: 03/16/2024 07:43 AM     Modules accepted: Orders

## 2024-03-21 NOTE — Progress Notes (Signed)
 03/24/24 Clinic  - wound check  - CT 03/09/24    Last seen in APP clinic 02/29/24 with the following plan:  Tracy Robles 58 y.o. female PMHx of morbid obesity, psoriasis, eczema, DM, HTN, and obesity who presented to the ED on 9/9 with SBO and underwent ex lap with repair of small bowel perforations x2. She was subsequently readmitted at [facility] for postoperative hematoma.      On exam no signs or symptoms of overt infection is noted. Area appears similar to previous visit if not somewhat improved.  Discussed with the patient given continued drainage and pain will proceed with CT AP with PO + IV contrast. Our office will work on authorization and reach out to patient when ready to schedule. Area does not need to be packed and we discussed this. Follow up to be based on CT results.     Imaging  03/09/24 CT A/P  Impression   1.  Evolution of the previously identified hematoma which is now smaller and less dense.   2.  No evidence of enterocutaneous fistula

## 2024-03-24 ENCOUNTER — Ambulatory Visit
Admit: 2024-03-24 | Discharge: 2024-03-25 | Payer: BLUE CROSS/BLUE SHIELD | Attending: Surgical Critical Care | Primary: Surgical Critical Care

## 2024-03-24 NOTE — Progress Notes (Signed)
 [facility] AC[facility], and GENERAL SURGERY   - Follow-up Appointment -  03/24/2024       Patient: Tracy Robles  DOB: [birth year]  Primary Care Provider:  Servando Hire, MD      CC: chronic wound      Hospitalization for: small bowel obstruction  Discharged on: 12/28/23  Last seen in clinic on 02/29/24 by Lauraine Ancona, FNP      Subjective: Sonja Manseau is a 58 yo F with PMH psoriasis, eczema, DM, HTN, and obesity who was hospitalized on 12/22/23 with a small bowel obstruction. She was taken to the OR on 9/10 for diagnostic laparoscopy converted to exploratory laparotomy with lysis of adhesions and repair of 2 small bowel perforations. She was discharged on 9/15. Postoperative course was complicated by an intra-abdominal hematoma and small nonhealing wound at her umbilicus. She was last seen in clinic on 11/17 noting a small red spot within her umbilicus with ongoing serous drainage. She complains of tenderness to the right of this area. She has been applying a variety of creams and sprays to the area without improvement. She reports that she is not cleaning all the way within her belly button because she was concerned the wound would worsen. She is otherwise feeling well and looking forward to returning to work in January.       BP 125/75 (BP Site: R Arm, BP Position: Sitting, BP Cuff Size: Large)  - Pulse 96  - Temp 36.7 ??C (98.1 ??F) (Temporal)  - Ht 160 cm (5' 3)  - Wt (!) 114.5 kg (252 lb 8 oz)  - SpO2 96%  - BMI 44.73 kg/m??   Estimated body mass index is 44.73 kg/m?? as calculated from the following:    Height as of this encounter: 160 cm (5' 3).    Weight as of this encounter: 114.5 kg (252 lb 8 oz).        Physical Exam:  Vitals:    03/24/24 1235   BP: 125/75   Pulse: 96   Temp: 36.7 ??C (98.1 ??F)   SpO2: 96%     General: awake, alert, afebrile, in NAD  Neuro: Alert and oriented ??3  Lungs: Normal work of breathing on room air  Abdomen: Soft, obese non tender, non distended. Incision is mostly C/D/I with no erythema, edema or ecchymosis noted. Still has a very small area residual inside her umbilicus that has not healed completely. No further fibrinous exudate. No obvious drainage.    Imaging Results:  Impression   1.  Evolution of the previously identified hematoma which is now smaller and less dense.   2.  No evidence of enterocutaneous fistula        Narrative   EXAM: CT ABDOMEN PELVIS W CONTRAST   ACCESSION: [id number]   REPORT DATE: 03/09/2024 5:02 PM   CLINICAL INDICATION: 58 years old with eval persistant umbilical drainge, fluid collection vs fistula  - R18.8 - Intraabdominal fluid collection        COMPARISON: CT of 01/12/2024      TECHNIQUE: A helical CT scan of the abdomen and pelvis was obtained following IV contrast from the lung bases through the pubic symphysis. Images were reconstructed in the axial plane. Coronal and sagittal reformatted images were also provided for further evaluation.         FINDINGS:      LOWER CHEST: Unremarkable.      LIVER: Normal liver contour.  No focal liver lesions.  BILIARY: Gallbladder is unremarkable no biliary ductal dilatation.        SPLEEN: Normal in size and contour.      PANCREAS: Normal pancreatic contour.  No focal lesions.  No ductal dilation.      ADRENAL GLANDS: Normal appearance of the adrenal glands.      KIDNEYS/URETERS: Symmetric renal enhancement.  No hydronephrosis.  No solid renal mass.      BLADDER: Unremarkable.      REPRODUCTIVE ORGANS: There is a 4 x 2.8 x 3 cystic lesion in the right adnexa. This lesion was not present with this density and therefore this likely also represents an evolving hematoma.      GI TRACT: No findings of bowel obstruction or acute inflammation.  Appendix XXXXX      PERITONEUM, RETROPERITONEUM AND MESENTERY: No free air.  No ascites. There is been evolution of the previously identified hematoma which is now has a thick wall and a low-density center. It is approximately 15 to 16 cm in length and extends from the anterior lateral abdominal wall to the left adnexal area. The lesion is smaller compared to prior exam and much and the density has been reduced. (2:89, 4:35). This collection is lateral to the umbilicus. We do not see a tract from this collection to the umbilicus. No oral contrast is noted entering the umbilicus. Therefore, an enterocutaneous fistula fistula is not identified.      LYMPH NODES: No adenopathy.      VESSELS: Hepatic and portal veins are patent.  Normal caliber aorta.        BONES and SOFT TISSUES: No aggressive osseous lesions.  No focal soft tissue lesions.         Assessment & Plan:    58 yo F with PMH psoriasis, obesity, DM, HTN, and obesity who has small persistent wound at the umbilicus following ex lap in September 2025. She underwent CT scan which was reassuring for no fistula or draining fluid collection at the umbilicus. She does have a persistent 2-3 mm area of granulation tissue on the right side of her umbilicus with some serous drainage noted within her umbilicus and causing crusting around her umbilicus. We discussed that she should stop applying creams/sprays to her umbilicus. We recommended cleaning daily with unscented soap and water. She should clean within her belly button as well as around it to prevent buildup of drainage.     Ok to return to work on 04/18/24. Return to clinic on 04/12/24 for wound check.      Note initiated by surgical resident:  ------------------------------  Leita Salters, MD  General Surgery PGY5

## 2024-03-29 DIAGNOSIS — I10 Essential (primary) hypertension: Principal | ICD-10-CM

## 2024-03-29 DIAGNOSIS — Z23 Encounter for immunization: Principal | ICD-10-CM

## 2024-03-29 DIAGNOSIS — L309 Dermatitis, unspecified: Principal | ICD-10-CM

## 2024-03-29 DIAGNOSIS — E1165 Type 2 diabetes mellitus with hyperglycemia: Principal | ICD-10-CM

## 2024-03-29 MED ORDER — EMPAGLIFLOZIN 10 MG TABLET
ORAL_TABLET | Freq: Every day | ORAL | 3 refills | 90.00000 days | Status: CP
Start: 2024-03-29 — End: ?

## 2024-03-29 NOTE — Progress Notes (Signed)
 Assessment and Plan:     Assessment & Plan  Type 2 diabetes mellitus with hyperglycemia  Managed with Mounjaro  12.5 mg. Awaiting A1c results for glycemic control assessment.  - Continue Mounjaro  12.5 mg.  - A1c=5.3    Primary hypertension  Blood pressure well-controlled at 111/75 mmHg.  - Continue current antihypertensive regimen.    Postoperative state after small bowel surgery with delayed wound healing  Delayed wound healing with oozing and itching. CT scan shows improvement. Surgical follow-up scheduled for December 30th.  - Continue wound care with sensitive soap and water.  - Follow up with surgical team on December 30th.    Psoriasis and eczema  Managed with Kenalog  cream. Inadequate response reported. Dermatology constrained by insurance.  - Continue Kenalog  cream.  - Contact dermatology for potential treatment adjustment.               I personally spent 30 minutes face-to-face and non-face-to-face in the care of this patient, which includes all pre, intra, and post visit time (reviewing, evaluating and discussing pertinent records for patient's care) on the date of service.       Return in about 4 months (around 07/28/2024) for Recheck, cdm.    Subjective:     HPI: Tracy Robles is a 58 y.o. female here for        History of Present Illness  Tracy Robles is a 58 year old female who presents for follow-up of her surgical wound and weight management. She is accompanied by her mother.    She underwent surgery for a small bowel obstruction in September and continues to experience issues with wound healing. The wound is still oozing slightly but has improved. It continues to itch, and there is a small raw spot that she is managing with soap and water. She reports that a recent CT scan was performed and she was told it looked better. She is scheduled for a follow-up on the 30th before returning to work on January 5th.    She is actively managing her weight, noting a recent fluctuation from 252 to 253 pounds. She is currently on Mounjaro  12.5 mg, which has been effective in weight reduction. She is not consuming sodas and is drinking water instead.    She has a history of psoriasis and eczema, for which she is using Kenalog  cream and triamcinolone . Her skin condition is not improving as expected, with her lower back appearing as if it has been burnt. She is also applying Vaseline, but it is not staying on well with the cream.    She is taking Jardiance  once daily, usually at night, and prefers to fill her prescription at [facility] in [place]. She is also on Cosentyx , using a 300 mg dose instead of two 150 mg pens.         ROS:   Review of Systems     Review of systems negative unless otherwise noted as per HPI.      The following portions of the patient's history were reviewed and updated as appropriate: allergies, current medications, past family history, past medical history, past social history, past surgical history and problem list.     Objective:     Vitals:    03/29/24 1506   BP: 111/75   Pulse: 101   Temp: 36.6 ??C (97.9 ??F)   SpO2: 99%     There is no height or weight on file to calculate BMI.    Physical Exam  Vitals and nursing  note reviewed.   Constitutional:       General: She is not in acute distress.     Appearance: Normal appearance. She is not ill-appearing, toxic-appearing or diaphoretic.   Cardiovascular:      Rate and Rhythm: Normal rate and regular rhythm.      Heart sounds: Normal heart sounds.   Pulmonary:      Effort: Pulmonary effort is normal. No respiratory distress.      Breath sounds: Normal breath sounds.   Musculoskeletal:      Cervical back: Neck supple.   Neurological:      Mental Status: She is alert.                Results  Radiology  Abdominal CT (03/09/2024): Improved compared to prior imaging; no acute abnormality         Allergies:     Lisinopril, Penicillins, and Latex      THEODORO JUST, MD    [facility]:     Medication adherence and barriers to the treatment plan have been addressed. Opportunities to optimize healthy behaviors have been discussed. Patient / caregiver voiced understanding.

## 2024-03-30 NOTE — Progress Notes (Unsigned)
 04/12/24 Clinic   - wound check    Last seen in clinic 03/24/24 Tracy Robles) with the following plan:  Assessment & Plan:    58 yo F with PMH psoriasis, obesity, DM, HTN, and obesity who has small persistent wound at the umbilicus following ex lap in September 2025. She underwent CT scan which was reassuring for no fistula or draining fluid collection at the umbilicus. She does have a persistent 2-3 mm area of granulation tissue on the right side of her umbilicus with some serous drainage noted within her umbilicus and causing crusting around her umbilicus. We discussed that she should stop applying creams/sprays to her umbilicus. We recommended cleaning daily with unscented soap and water. She should clean within her belly button as well as around it to prevent buildup of drainage.      Ok to return to work on 04/18/24. Return to clinic on 04/12/24 for wound check.

## 2024-03-31 ENCOUNTER — Inpatient Hospital Stay: Admit: 2024-03-31 | Discharge: 2024-03-31 | Payer: Medicaid (Managed Care)

## 2024-03-31 DIAGNOSIS — N83201 Unspecified ovarian cyst, right side: Principal | ICD-10-CM

## 2024-03-31 DIAGNOSIS — N9089 Other specified noninflammatory disorders of vulva and perineum: Principal | ICD-10-CM

## 2024-03-31 DIAGNOSIS — N950 Postmenopausal bleeding: Principal | ICD-10-CM

## 2024-03-31 DIAGNOSIS — N83202 Unspecified ovarian cyst, left side: Principal | ICD-10-CM

## 2024-04-02 DIAGNOSIS — N83202 Unspecified ovarian cyst, left side: Principal | ICD-10-CM

## 2024-04-02 DIAGNOSIS — N83201 Unspecified ovarian cyst, right side: Principal | ICD-10-CM

## 2024-04-02 NOTE — Telephone Encounter (Signed)
 Please contact patient to inform US  showed decrease in size of right sided cyst. The left ovary could not be clearly identified. Plan repeat Us  in 8 weeks to make sure the cyst continues to get smaller    Please close encounter when complete  Thanks

## 2024-04-04 NOTE — Telephone Encounter (Signed)
 Opened in error

## 2024-04-04 NOTE — Telephone Encounter (Signed)
 Per Dr. Arleen:   Please contact patient to inform US  showed decrease in size of right sided cyst. The left ovary could not be clearly identified. Plan repeat Us  in 8 weeks to make sure the cyst continues to get smaller     Please close encounter when complete  Thanks    S/W pt to inform provider feedback.  Pt VU and will scheduled another US  in 8 weeks.

## 2024-04-04 NOTE — Progress Notes (Signed)
 04/12/24 Clinic   - wound check  - 12/19 sent letter allowing patient to return to work 12/23    She feels good and is doing well overall. Minuscule area of granulation tissue occasionally draining some serosanguinous fluid. I applied silver nitrate with good effect and a clean dressing. Follow-up in 2 weeks, but she can cancel if it heals nicely.         _______________________________________  Elsie Ellaree March, MD, FACS  Assistant Professor of Surgery  Division of Trauma and Acute Care Surgery  Department of Surgery  [employer]  at [facility]      Last seen in clinic 03/24/24 Nora) with the following plan:  Assessment & Plan:    58 yo F with PMH psoriasis, obesity, DM, HTN, and obesity who has small persistent wound at the umbilicus following ex lap in September 2025. She underwent CT scan which was reassuring for no fistula or draining fluid collection at the umbilicus. She does have a persistent 2-3 mm area of granulation tissue on the right side of her umbilicus with some serous drainage noted within her umbilicus and causing crusting around her umbilicus. We discussed that she should stop applying creams/sprays to her umbilicus. We recommended cleaning daily with unscented soap and water. She should clean within her belly button as well as around it to prevent buildup of drainage.      Ok to return to work on 04/18/24. Return to clinic on 04/12/24 for wound check.

## 2024-04-12 ENCOUNTER — Ambulatory Visit: Admit: 2024-04-12 | Discharge: 2024-04-13 | Payer: BLUE CROSS/BLUE SHIELD

## 2024-04-12 DIAGNOSIS — L929 Granulomatous disorder of the skin and subcutaneous tissue, unspecified: Principal | ICD-10-CM

## 2024-04-13 MED FILL — COSENTYX UNOREADY PEN 300 MG/2 ML SUBCUTANEOUS PEN INJECTOR: SUBCUTANEOUS | 28 days supply | Qty: 2 | Fill #1

## 2024-04-15 ENCOUNTER — Ambulatory Visit: Admitting: Podiatry

## 2024-04-20 NOTE — Progress Notes (Unsigned)
 04/26/24 Clinic  - wound check    Last seen in clinic 04/12/24 with the following plan:  She feels good and is doing well overall. Minuscule area of granulation tissue occasionally draining some serosanguinous fluid. I applied silver nitrate with good effect and a clean dressing. Follow-up in 2 weeks, but she can cancel if it heals nicely.

## 2024-04-21 ENCOUNTER — Ambulatory Visit: Admit: 2024-04-21 | Discharge: 2024-04-22 | Payer: BLUE CROSS/BLUE SHIELD

## 2024-04-21 DIAGNOSIS — M7138 Other bursal cyst, other site: Principal | ICD-10-CM

## 2024-04-21 DIAGNOSIS — M5416 Radiculopathy, lumbar region: Principal | ICD-10-CM

## 2024-04-21 NOTE — Progress Notes (Signed)
 [facility] Physical Medicine and Rehabilitation   New Patient Note    Patient Name:Tracy Robles  MRN: [id number]  DOB: 1966-03-15  Age: 59 y.o.   Date: 04/21/2024      ASSESSMENT:     Pt is a 59 y.o. year old female with: Right lumbar radiculitis  - Has significant central lateral recess stenosis caused by disc bulge, facet hypertrophy with a synovial cyst, ligamentum flavum hypertrophy  - Does not want surgery      PLAN:     - We will start her in a mechanical diagnose and treatment approach  - I will perform a right sided L3/4 cyst aspiration.  If no significant proven that would likely perform a right L4/5 versus L3/4 transforaminal epidural steroid injection      - Encouraged the patient to stay active.       - No follow-ups on file.    - Discussed at length today about her problem.    SUBJECTIVE:     Chief Complaint:    Back Pain      History of Present Illness:   Tracy Robles is a 59 y.o. year old female seen as a new evaluation regarding low back and right leg pain..  Tracy Robles states that she has had this pain for many years now.  However the pain is worsening.  She recently did physical therapy for several months with no significant benefit.  She denies any leg weakness and denies any falls.  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  She would like to avoid any more surgery at this time.    She has had imaging that is viewable in the [facility] system today..  I reviewed the MRI of the lumbar spine with patient today    We reviewed extensive past medical records today.       Current Medications[1]    Allergies:   Lisinopril, Penicillins, and Latex    Past Medical / Surgical History:   Past Medical History[2]    Past Surgical History[3]    Social History[4]  She is currently working full time.     Family History[5]                 Review of Systems:   Review of Systems was completed through a 10 organ system review and is listed in the chart.  Pertinent positive and negatives noted above.    .  OBJECTIVE:     Vitals:     Temp 36.3 ??C (97.3 ??F)  - Ht 160 cm (5' 2.99)  - Wt (!) 114.9 kg (253 lb 3.2 oz)  - BMI 44.86 kg/m??        04/21/24 1433   PainSc: 3          Physical Exam:   GENERAL: No acute distress, pleasant  PSYCH: Appropriate affect, cooperative  HEENT: Atraumatic, normocephalic   CARDIO: Extremities warm and well-perfused  RESP: Non-dyspneic on room air  SKIN: No focal lesions or ecchymosis appreciated on lumbar spine  MSK: Tenderness palpation of the lumbar spine  NEURO: 5 out of 5 strength in lower extremity seated       Diagnostic Studies:   Imaging results noted above in HPI         Franky Curtistine Ambrosia, DO  04/21/2024 2:50 PM          [1]   Current Outpatient Medications   Medication Sig Dispense Refill  blood sugar diagnostic (GLUCOSE BLOOD) Strp Check blood sugar as directed once a day and for symptoms of high or low blood sugar. 100 strip 3    blood-glucose meter kit Use as instructed 1 each 0    cetirizine  (ZYRTEC ) 5 MG tablet Take 1 tablet (5 mg total) by mouth once as needed.      diclofenac sodium (VOLTAREN) 1 % gel Apply 1 g topically.      empagliflozin  (JARDIANCE ) 10 mg tablet Take 1 tablet (10 mg total) by mouth daily. 90 tablet 3    loratadine (CLARITIN) 10 mg tablet Take 1 tablet (10 mg total) by mouth daily as needed for allergies.      losartan  (COZAAR ) 100 MG tablet Take 1 tablet (100 mg total) by mouth daily. (Patient taking differently: Take 1 tablet (100 mg total) by mouth Two (2) times a week.) 90 tablet 3    meloxicam  (MOBIC ) 15 MG tablet Take 1 tablet (15 mg total) by mouth daily as needed for pain. 30 tablet 2    multivit with min-folic acid (MULTIVITAMIN GUMMIES) 200 mcg Chew Chew 1 tablet daily.      nystatin  (MYCOSTATIN ) 100,000 unit/gram powder Apply to affected area 2 times daily 15 g 0    nystatin -triamcinolone  (MYCOLOG II) 100,000-0.1 unit/g-% cream Apply to affected area 2 times daily 30 g 1    ONETOUCH DELICA PLUS LANCET 33 gauge Misc CHECK BLOOD SUGAR AS DIRECTED ONCE A DAY AND FOR SYMPTOMS OF HIGH OR LOW BLOOD SUGAR      secukinumab  (COSENTYX  UNOREADY PEN) 300 mg/2 mL PnIj Inject the contents of 1 pen (300 mg) under the skin every twenty-eight (28) days. 2 mL 11    simvastatin  (ZOCOR ) 20 MG tablet Take 1 tablet (20 mg total) by mouth nightly. 90 tablet 3    spironolactone  (ALDACTONE ) 25 MG tablet Take 1 tablet (25 mg total) by mouth daily. 90 tablet 3    tirzepatide  (MOUNJARO ) 12.5 mg/0.5 mL PnIj Inject 12.5 mg under the skin every seven (7) days. 3 mL 4    topiramate  (TOPAMAX ) 25 MG tablet Take 1 tablet (25 mg total) by mouth nightly. 30 tablet 2    triamcinolone  (KENALOG ) 0.1 % cream Apply topically to active, raised areas twice daily as needed. 80 g 3    blood-glucose sensor Devi Use as directed (Patient not taking: Reported on 04/21/2024) 5 each 6     No current facility-administered medications for this visit.   [2]   Past Medical History:  Diagnosis Date    Allergic Don't know    Penicillin    Arthritis 2011    Lower back    BMI 60.0-69.9, adult (CMS-HCC) 05/29/2021    CTS (carpal tunnel syndrome)     Diabetes mellitus (CMS-HCC)     Eczema     High blood pressure     Neuromuscular disorder (CMS-HCC)     Psoriasis    [3]   Past Surgical History:  Procedure Laterality Date    OTHER SURGICAL HISTORY  1995    Endometriosis    PR ENTEROTOMY,NON-DUOD,EXPLOR/BX/FB REMOVAL N/A 12/22/2023    Procedure: ENTEROTOMY, SM INTESTINE, OTHER THAN DUODENUM; FOR EXPL, BX, OR FOREIGN BODY REMOVAL;  Surgeon: Vicci Janas CROME., MD;  Location: OR [facility];  Service: Trauma    PR EXPLORATORY OF ABDOMEN N/A 12/22/2023    Procedure: EXPLORATORY LAPAROTOMY, EXPLORATORY CELIOTOMY WITH OR WITHOUT BIOPSY(S);  Surgeon: Vicci Janas CROME., MD;  Location: OR [facility];  Service: Trauma    PR  LAP, SURG ENTEROLYSIS N/A 12/22/2023    Procedure: LAPAROSCOPY, SURGICAL, ENTEROLYSIS (FREEING OF INTESTINAL ADHESION) (SEPARATE PROCEDURE);  Surgeon: Vicci Janas CROME., MD;  Location: OR [facility]; Service: Trauma    PR LAP,DIAGNOSTIC ABDOMEN N/A 12/22/2023    Procedure: DIAGNOSTIC AND OR OPERATIVE LAPAROSCOPY;  Surgeon: Vicci Janas CROME., MD;  Location: OR [facility];  Service: Trauma   [4]   Social History  Socioeconomic History    Marital status: Widowed   Tobacco Use    Smoking status: Never     Passive exposure: Current    Smokeless tobacco: Never   Vaping Use    Vaping status: Never Used   Substance and Sexual Activity    Alcohol use: Not Currently     Comment: Only on Saturday    Drug use: Never    Sexual activity: Not Currently     Partners: Male     Birth control/protection: None   Other Topics Concern    Do you use sunscreen? No    Tanning bed use? No    Are you easily burned? No    Excessive sun exposure? No    Blistering sunburns? No     Social Drivers of Health     Food Insecurity: No Food Insecurity (12/23/2023)    Hunger Vital Sign     Worried About Running Out of Food in the Last Year: Never true     Ran Out of Food in the Last Year: Never true   Tobacco Use: Medium Risk (03/29/2024)    Patient History     Smoking Tobacco Use: Never     Smokeless Tobacco Use: Never     Passive Exposure: Current   Transportation Needs: No Transportation Needs (12/23/2023)    PRAPARE - Transportation     Lack of Transportation (Medical): No     Lack of Transportation (Non-Medical): No   Housing: Low Risk (12/23/2023)    Housing     Within the past 12 months, have you ever stayed: outside, in a car, in a tent, in an overnight shelter, or temporarily in someone else's home (i.e. couch-surfing)?: No     Are you worried about losing your housing?: No   Utilities: Low Risk (04/04/2023)    Utilities     Within the past 12 months, have you been unable to get utilities (heat, electricity) when it was really needed?: No   Interpersonal Safety: Not At Risk (03/09/2024)    Interpersonal Safety     Unsafe Where You Currently Live: No     Physically Hurt by Anyone: No     Abused by Anyone: No   Financial Resource Strain: Low Risk (12/23/2023)    Overall Financial Resource Strain (CARDIA)     Difficulty of Paying Living Expenses: Not hard at all   Internet Connectivity: Internet connectivity concern unknown (04/04/2023)    Internet Connectivity     Do you have access to internet services: Yes   [5]   Family History  Problem Relation Age of Onset    Diabetes Father     Hypertension Father     Kidney disease Father     Clotting disorder Father         Blood clot    Asthma Father     Diabetes Sister     Miscarriages / Stillbirths Sister     Hypertension Mother     Arthritis Mother     Dementia Mother     Alzheimer's disease Mother 10 - 89  Breast cancer Maternal Aunt     Stroke Maternal Grandmother     Miscarriages / Stillbirths Maternal Grandmother     Melanoma Neg Hx     Basal cell carcinoma Neg Hx     Squamous cell carcinoma Neg Hx     Ovarian cancer Neg Hx

## 2024-04-21 NOTE — Patient Instructions (Signed)
 Juli Credit  DPT, Cert. MDT [facility] Physical & Sports Rehabilitation  [address]  [place], [place] [address] [phone]

## 2024-04-25 NOTE — Progress Notes (Signed)
 [facility] ACUTE CARE AND GENERAL SURGERY CLINIC NOTE          Patient Name: Tracy Robles  Medical Record Number: [id number]  Date of Service: 04/26/2024    Reason for Visit: Follow up wound check      History of Present Illness    Tracy Robles 59 y.o. female PMHx of psoriasis, eczema, DM, HTN, and obesity who presented to the ED on 9/9 with acute onset epigastric pain and emesis. CTA abdomen demonstrating small bowel obstruction with transition point in the right lower quadrant. The patient was taken to the OR on 12/23/2023 for diagnostic laparoscopy with laparoscopic lysis of adhesions converted to open laparotomy with repair of small bowel perforations x2. Operative findings were adhesive band from the uterus adhered to the small bowel causing small bowel obstruction with resultant upstream dilation of bowel; two small bowel perforations noted in the jejunum requiring repair; no bowel resection needed.  She was discharged home on POD #6 in good condition.     She see seen again in clinic on 9/29 with complaints of breathlessness, incisional leakage, and severe abdominal pain. She was advised to present to the ER. She presented to the ED at [facility] and was admitted there. CT revealed no PE, but did show large hematoma. She was monitored with serial CBC, abdominal exams and there was some concern for potential developing fistula.    Seen again on 10/9 with some moderate improvement in her pain and drainage. Silver nitrate was used to treat her umbilical area.  Seen again on 10/21 and noted to be healing well, but declined further silver nitrate to area. She was last seen and discharged from general surgery clinic on 11/3, but represented with complaints of continued drainage. A CT scan was performed which showed no tract or fluid collection at umbilicus, but did still show 15x16 cm hematoma. Patient was then seen by Dr. Shaun two weeks ago who applied silver nitrate to her remaining wound.     Patient returns to clinic today with no new concerns or complaints. She reports the area has healed since the last application of silver nitrate. She has not had any more pain or discharge. She is letting soap and water clean the area. She is still using a qtip to clean deeper in the umbilicus     Reivew of Systems:  Negative except otherwise noted in the HPI.    Physical Exam:  Vitals:    04/26/24 1045   BP: (!) 167/78   Pulse: 94   Temp: 36.3 ??C (97.4 ??F)       General: awake, alert, afebrile, in NAD  Neuro: Alert and oriented ??3  Lungs: Normal work of breathing on room air  Abdomen: soft, non tender, non distended. Umbilical area si healed with resolution of small area of granulation tissue. Area is clean dry and intact with no discharge, erythema, or edema           Assessment & Plan        Tracy Robles 59 y.o. female PMHx of morbid obesity, psoriasis, eczema, DM, HTN, and obesity who presented to the ED on 9/9 with SBO and underwent ex lap with repair of small bowel perforations x2. She was subsequently readmitted at [facility] for postoperative hematoma.     On exam no signs or symptoms of infection is noted and patient is healing well with resolution of her prior umbilical wound.     Discussed with the patient at  this time, does not need to follow-up with the general surgery clinic only on an as-needed basis.  If any new questions or concerns should arise to give the clinic a call, otherwise continue follow-up with their primary care physician.      Patient was agreeable to plan

## 2024-04-26 ENCOUNTER — Inpatient Hospital Stay: Admit: 2024-04-26 | Discharge: 2024-04-26 | Payer: BLUE CROSS/BLUE SHIELD

## 2024-04-26 ENCOUNTER — Ambulatory Visit: Admit: 2024-04-26 | Discharge: 2024-04-26 | Payer: BLUE CROSS/BLUE SHIELD

## 2024-04-26 ENCOUNTER — Ambulatory Visit: Admitting: Podiatry

## 2024-04-26 DIAGNOSIS — Z4889 Encounter for other specified surgical aftercare: Principal | ICD-10-CM

## 2024-04-28 NOTE — Progress Notes (Unsigned)
 Outpatient Gynecology Note - Established Patient    ASSESSMENT AND PLAN     Problem List Items Addressed This Visit    None    No follow-ups on file.    Tracy FORBES Grounds, MD    SUBJECTIVE     This 59 y.o. No obstetric history on file. is an established [facility] patient who presents for a problem visit.  Answers submitted by the patient for this visit:  RETURN GYNECOLOGY on 04/28/2024  1:00 PM with Tracy Grounds, MD  Female Genital Questionnaire (Submitted on 04/26/2024)  Chief Complaint: Female genitourinary complaint  genital itching: Yes  vaginal bleeding: Yes  vaginal discharge: Yes  Chronicity: chronic  Onset: more than 1 month ago  Frequency: daily  Progression since onset: rapidly improving  Pain severity: no pain  Affected side: both  Pregnant now?: No  abdominal pain: No  anorexia: No  back pain: Yes  chills: No  constipation: No  diarrhea: No  discolored urine: No  dysuria: No  fever: No  flank pain: Yes  frequency: No  headaches: No  hematuria: No  joint pain: No  joint swelling: No  nausea: No  painful intercourse: Yes  rash: No  sore throat: No  urgency: No  vomiting: No  Aggravated by: nothing  Sexual activity: not sexually active  Partner with STD symptoms: no  Birth control: nothing  Menstrual history: postmenopausal  Vaginal bleeding: spotting  Passing clots?: No  Passing tissue?: No  Discharge characteristics: brown    US  02/2025:  Impression      - Decrease in size of hypoechoic lesion in the right ovary measuring 1.4 cm, previously 2.4 cm, which may represent hemorrhagic ovarian cyst. Recommend continued short interval follow-up pelvic ultrasound to ensure continued decrease in size.   - Normal appearance of the left ovary.           Narrative   EXAM: US  ENDOVAGINAL (NON-OB)   ACCESSION: [id number] UN   REPORT DATE: 04/26/2024 2:43 PM      CLINICAL INDICATION: 59 years old with follow up ovarian cysts  - N83.201 - Bilateral ovarian cysts - N83.202 - Bilateral ovarian cysts   LMP: postmenopausal COMPARISON: Pelvis ultrasound 03/31/2024      TECHNIQUE: As per sonographer, the endovaginal pelvic procedure was explained to the patient and the patient verbally consented to the exam. Ultrasound views of the pelvis were obtained endovaginally and transabdominally using gray scale and color Doppler imaging.         FINDINGS:      UTERUS/CERVIX: The uterus was anteverted and measures 4.4 x 2.5 x 3.5 cm. No focal myometrial mass was seen.  The endometrium was normal in thickness, measuring 0.3 cm. Multiple nabothian cysts.      OVARIES: The ovaries were seen well transvaginally. Appropriate flow was documented on color Doppler imaging. The right ovary measured 2.8 x 2.4 x 2.1 cm and the left ovary measured 1.7 x 1.9 x 1.7 cm. Hypoechoic lesion with low internal echoes and no internal vascularity within the right ovary measuring 1.4 x 1.1 x 1.3 cm, previously measuring 2.4 x 1.4 x 1.8 cm.      OTHER: No abnormal pelvic free fluid.       GYN History:   Menstrual cycles: {menstrual description:94276}  Last Pap: {last pap:93942}   Abnormal Pap hx: {abnormal Pap hx:93943}  STI history: {negative:210020314}  Sexually active: {yes:28281}    Sex partner(s): {female female wildcard:94270}  Contraceptive method: {contraceptive method:93940}    Past medical  and surgical history was reviewed and updated.    Current Medications[1]    Allergies[2]    REVIEW OF SYSTEMS: See HPI; remaining balance of 10 systems are negative/non-contributory.    OBJECTIVE   ***  There were no vitals taken for this visit.  General: well-appearing and in NAD  Lymph:  no axillary or inguinal lymphadenopathy  Neck:  thyroid smooth, non-enlarged, without nodules  Breasts:  no nipple discharge, no suspicious masses   Abdomen:  soft, NT  Pelvic:  normal external genitalia, BUS negative, vagina and cervix are without gross lesions, uterus is normal size and non-tender, no CMT, no adnexal tenderness or palpable masses  Psych: pleasant and interactive, answers questions appropriately    A chaperone was present for any sensitive portions of the exam (if performed) including but not limited to, breast and pelvic examination.  If performed, consent was obtained from patient prior to the sensitive examination.  If medical students were involved, explicit additional consent was obtained.     LABS   ***  Results for orders placed or performed in visit on 03/29/24   POCT glycosylated hemoglobin (Hb A1C)   Result Value Ref Range    Hemoglobin A1C 5.3 4.0 - 6.0 %    A1C LOT NBR 935     A1C STRIP EXP 8/2,027             [1]   Current Outpatient Medications   Medication Sig Dispense Refill    blood sugar diagnostic (GLUCOSE BLOOD) Strp Check blood sugar as directed once a day and for symptoms of high or low blood sugar. 100 strip 3    blood-glucose meter kit Use as instructed 1 each 0    blood-glucose sensor Devi Use as directed 5 each 6    cetirizine  (ZYRTEC ) 5 MG tablet Take 1 tablet (5 mg total) by mouth once as needed.      diclofenac sodium (VOLTAREN) 1 % gel Apply 1 g topically.      empagliflozin  (JARDIANCE ) 10 mg tablet Take 1 tablet (10 mg total) by mouth daily. 90 tablet 3    loratadine (CLARITIN) 10 mg tablet Take 1 tablet (10 mg total) by mouth daily as needed for allergies.      losartan  (COZAAR ) 100 MG tablet Take 1 tablet (100 mg total) by mouth daily. (Patient taking differently: Take 1 tablet (100 mg total) by mouth Two (2) times a week.) 90 tablet 3    meloxicam  (MOBIC ) 15 MG tablet Take 1 tablet (15 mg total) by mouth daily as needed for pain. 30 tablet 2    multivit with min-folic acid (MULTIVITAMIN GUMMIES) 200 mcg Chew Chew 1 tablet daily.      nystatin  (MYCOSTATIN ) 100,000 unit/gram powder Apply to affected area 2 times daily 15 g 0    nystatin -triamcinolone  (MYCOLOG II) 100,000-0.1 unit/g-% cream Apply to affected area 2 times daily 30 g 1    ONETOUCH DELICA PLUS LANCET 33 gauge Misc CHECK BLOOD SUGAR AS DIRECTED ONCE A DAY AND FOR SYMPTOMS OF HIGH OR LOW BLOOD SUGAR      secukinumab  (COSENTYX  UNOREADY PEN) 300 mg/2 mL PnIj Inject the contents of 1 pen (300 mg) under the skin every twenty-eight (28) days. 2 mL 11    simvastatin  (ZOCOR ) 20 MG tablet Take 1 tablet (20 mg total) by mouth nightly. 90 tablet 3    spironolactone  (ALDACTONE ) 25 MG tablet Take 1 tablet (25 mg total) by mouth daily. 90 tablet 3    tirzepatide  (  MOUNJARO ) 12.5 mg/0.5 mL PnIj Inject 12.5 mg under the skin every seven (7) days. 3 mL 4    topiramate  (TOPAMAX ) 25 MG tablet Take 1 tablet (25 mg total) by mouth nightly. 30 tablet 2    triamcinolone  (KENALOG ) 0.1 % cream Apply topically to active, raised areas twice daily as needed. 80 g 3     No current facility-administered medications for this visit.   [2]   Allergies  Allergen Reactions    Lisinopril Cough    Penicillins      Other reaction(s): UNSPECIFIED    Latex Rash     Other reaction(s): UNKNOWN    latex

## 2024-05-01 DIAGNOSIS — N83201 Unspecified ovarian cyst, right side: Principal | ICD-10-CM

## 2024-05-04 ENCOUNTER — Ambulatory Visit

## 2024-05-10 ENCOUNTER — Ambulatory Visit

## 2024-05-11 NOTE — Therapy (Unsigned)
 " OUTPATIENT PHYSICAL THERAPY EVALUATION   Patient Name: Julia Snow MRN: [id number] DOB:Sep 08, 1965, 59 y.o., female Today's Date: 05/11/2024  END OF SESSION:   Past Medical History:  Diagnosis Date   Allergy    Hypertension    Miscarriage 2005   Sleep apnea    does not wear CPAP   Past Surgical History:  Procedure Laterality Date   DILATION AND CURETTAGE OF UTERUS  2005   endometrial surgery  1995   left fallopian tube removal Left 2000   Patient Active Problem List   Diagnosis Date Noted   Mass of left hand 02/12/2021   Cramp of both lower extremities 09/06/2020   Left shoulder pain 06/07/2020   Ingrown toenail of right foot 11/23/2019   Administrative encounter 02/10/2019   Back pain 11/18/2018   Type 2 diabetes mellitus with hyperglycemia, without long-term current use of insulin (HCC) 02/04/2018   Psoriasis 10/06/2016   Eczema 10/06/2016   BMI 60.0-69.9, adult (HCC) 10/06/2016   Hypertension 09/30/2016   Sleep apnea 09/30/2016   Tinea pedis 10/17/2010    PCP: Tester, Rolland LABOR, PA-C   REFERRING PROVIDER: Franky Curtistine Ambrosia, DO  REFERRING DIAG: right lumbar radiculitis  Rationale for Evaluation and Treatment: Rehabilitation  THERAPY DIAG:  No diagnosis found.  ONSET DATE: ***  SUBJECTIVE:                                                                                                                                                                                           SUBJECTIVE STATEMENT: Patient is here for PT evaluation by Cert. MDT physical therapist for right lumbar radiculitis at the request of Dr. Franky Curtistine Ambrosia, DO from [facility]   From Assessment and Plan section of Dr. Babette note on 04/21/2024:  ASSESSMENT:  Pt is a 59 y.o. year old female with: Right lumbar radiculitis - Has significant central lateral recess stenosis caused by disc bulge, facet hypertrophy with a synovial cyst, ligamentum flavum hypertrophy - Does  not want surgery PLAN:  - We will start her in a mechanical diagnose and treatment approach - I will perform a right sided L3/4 cyst aspiration. If no significant proven that would likely perform a right L4/5 versus L3/4 transforaminal epidural steroid injection   PERTINENT HISTORY:  Patient is a 59 y.o. female who presents to outpatient physical therapy with a referral for medical diagnosis right lumbar radiculitis. This patient's chief complaints consist of ***, leading to the following functional deficits: ***. Relevant past medical history and comorbidities include the following: carpal tunnel syndrome, diabetes mellitus, HTN, neuromuscular disorder, psoriasis, low back arthritis. Past surgical history  includes abdominal surgery 12/22/2023, left shoulder pain, endometrial surgery. Patient denies hx of {redflags:27294}  Exercise history: ***    PAIN: Are you having pain? Yes NPRS: Current: ***/10,  Best: ***/10, Worst: ***/10. Pain location: *** Pain description: *** Aggravating factors: see below Relieving factors: see below   FUNCTIONAL LIMITATIONS: ***  LEISURE: ***   PRECAUTIONS: {Therapy precautions:24002}  WEIGHT BEARING RESTRICTIONS: {Yes ***/No:24003}  FALLS:  Has patient fallen in last 6 months? {fallsyesno:27318}  LIVING ENVIRONMENT: Lives with: {OP[facility] lives with:25569::lives with their family} Lives in: {Lives in:25570} Stairs: {opstairs:27293} Has following equipment at home: {Assistive devices:23999}  OCCUPATION: ***  PLOF: {PLOF:24004}  PATIENT GOALS: ***  NEXT MD VISIT: ***  OBJECTIVE:   [facility] Lumbar Spine Assessment  Patient Information  Age: 18 Referral: Orth Work Demands: *** Leisure Activities: *** Functional Limitation for Present Episode: *** Outcome / Screening Score: *** NPRS (0-10): ***/10,  Best: ***/10, Worst: ***/10. Present Symptoms: ***. Description: ***.  Present Since: ***,  {ImprovingUnchangingWorsening:32694} Commenced As a Result of: {commenced as the result of:32695} Symptoms at Onset: {BackThighLeg:32696} Constant Symptoms: {BackThighLeg:32696} Intermittent Symptoms: {BackThighLeg:32696}  Worse - Bending: {AlwaysNeverSometimes:32692} - Sitting: {AlwaysNeverSometimes:32692} - Rising: {AlwaysNeverSometimes:32692} - Standing: {AlwaysNeverSometimes:32692} - Walking: {AlwaysNeverSometimes:32692} - Lying: {AlwaysNeverSometimes:32692} - AM: {AlwaysNeverSometimes:32692} - As the Day Progresses: {AlwaysNeverSometimes:32692} - PM: {AlwaysNeverSometimes:32692} - When Still: {AlwaysNeverSometimes:32692} - On the Move: {AlwaysNeverSometimes:32692} - Other: ***:   Better - Bending: {AlwaysNeverSometimes:32692} - Sitting: {AlwaysNeverSometimes:32692} - Standing: {AlwaysNeverSometimes:32692} - Walking: {AlwaysNeverSometimes:32692} - Lying: {AlwaysNeverSometimes:32692} - AM: {AlwaysNeverSometimes:32692} - As the Day Progresses: {AlwaysNeverSometimes:32692} - PM: {AlwaysNeverSometimes:32692} - When Still: {AlwaysNeverSometimes:32692} - On the Move: {AlwaysNeverSometimes:32692} - Other: ***:   Sleep and Rest  Disturbed Sleep: {Yes/No:304960894} Sleeping Postures: {ProneSupSideRL:32697} Surface: ***  Medical History  Previous Spinal History: *** Previous Treatments: ***  SPECIFIC QUESTIONS - Cough: {yes/no:20286}, Sneeze: {yes/no:20286}, Strain: {yes/no:20286} - Bladder/Bowel: {Desc; normal/abnormal:11317::Normal} - Gait: {Desc; normal/abnormal:11317::Normal}  Medications: see chart General Health/Comorbidities: *** carpal tunnel syndrome, diabetes mellitus, HTN, neuromuscular disorder, psoriasis, low back arthritis. Past surgical history includes abdominal surgery 12/22/2023, left shoulder pain, endometrial surgery. Patient denies hx of {redflags:27294} Recent/Relevant Surgery: {Yes/No:304960894} History of Cancer: {Yes/No:304960894} Unexplained  Weight Loss: {Yes/No:304960894} History of Trauma: {Yes/No:304960894} Imaging: Yes:    Lumbar MRI report from 03/10/2024:  EXAM: Magnetic resonance imaging, spinal canal and contents, lumbar, without contrast material.   CLINICAL INDICATION: 59 years old Female with Chronic low back pain greater than 6 weeks  - M54.50 - Chronic bilateral low back pain without sciatica - G89.29 - Chronic bilateral low back pain without sciatica - M43.16 - Spondylolisthesis at L4 - L5 level - M47.816 - Lumbar facet arthropathy    COMPARISON: CT ABDOMEN PELVIS WO CONTRAST 01/12/2024, XR LUMBAR SPINE AP, LATERAL, FERGUSON 01/22/2023, endovaginal ultrasound 02/04/2024   TECHNIQUE: Multiplanar MRI was performed through the lumbar spine without intravenous contrast.   FINDINGS:  Bone marrow signal intensity is normal. The visualized cord is unremarkable and the conus medullaris ends at a normal level. There is a thickened and somewhat clumped appearance of the nerve roots at the L5 level.   Moderate left convex curvature with mild left lateral listhesis of L3 on L4. Grade 1 anterolisthesis of L4 on L5 and grade 1 retrolisthesis of L5 on S1. Severe disc desiccation from L3-S1. Small bilateral L3-S1 facet joint effusions.   L1-L2: No herniation. No spinal canal narrowing. No neural foraminal narrowing.   L2-L3: No herniation. No spinal canal narrowing. No neural foraminal narrowing.   L3-L4: Disc bulge, severe bilateral facet hypertrophy with  8 mm right facet joint synovial cyst, and ligamentum flavum thickening resulting in moderate to severe spinal canal stenosis. Additional small cranially projecting right subarticular disc extrusion as can be seen for instance on 101:16-17 and 2:50-51. Moderate bilateral neural foraminal stenosis.   L4-L5: Grade 1 anterolisthesis, disc bulge, severe bilateral facet hypertrophy, and ligamentum flavum thickening with severe spinal canal stenosis. Mild right and moderate left neural  foraminal stenosis.   L5-S1: Disc bulge and bilateral moderate facet hypertrophy resulting in mild spinal canal stenosis. Moderate left neural foraminal stenosis.   Fatty atrophy of the paraspinal muscles.   Partially visualized walled off 4.6 x 4.1 cm T2 hyperintense cystic adnexal lesion better evaluated on prior CT and ultrasound (see key images).   For the purposes of this dictation, the lowest well formed intervertebral disc space is assumed to be the L5-S1 level, and there are presumed to be five lumbar-type vertebral bodies.   IMPRESSION:   Severe degenerative changes of the lumbar spine with up to severe spinal canal stenosis at L4-L5. Multiple additional levels of spinal canal and neural foraminal stenosis described in the body of the report.  Patient Goals/Expectations: ***  OBJECTIVE EXAMINATION  Sitting Posture: {LordoticNeutralKypohtic:32698} Change of Posture Effect: {BetterWorseNoEffect:32699} Standing Posture: {LordoticNeutralKypohtic:32698} Lateral Shift: {RightLeftNil:32700} Shift Relevance: {YES/NO:21197} Other Observations/Functional Baselines: ***  Neurological  Motor Deficit: *** Reflexes: *** Sensory Deficit: *** Neurodynamic Tests: ***  Movement Loss (AROM) Movement Loss Symptoms  Flexion {MajModMinNil:32701} ***  Extension {MajModMinNil:32701} ***  Side Gliding R {MajModMinNil:32701} ***  Side Gliding L {MajModMinNil:32701} ***  Other: *** {MajModMinNil:32701} ***    Repeated Motions Testing Pre-Test Symptoms Test Movement Symptom During Symptom After Mechanical Response Key Functional Test  *** *** {ProducesAbolishesIncreases:32702} {BetterWorseNobetterNoworse:32703} {IncreasedROMDecreasedROMNoEffect:32704} {BetterWorseNoEffect:32699}  *** *** {ProducesAbolishesIncreases:32702} {BetterWorseNobetterNoworse:32703} {IncreasedROMDecreasedROMNoEffect:32704} {BetterWorseNoEffect:32699}  *** *** {ProducesAbolishesIncreases:32702}  {BetterWorseNobetterNoworse:32703} {IncreasedROMDecreasedROMNoEffect:32704} {BetterWorseNoEffect:32699}  *** *** {ProducesAbolishesIncreases:32702} {BetterWorseNobetterNoworse:32703} {IncreasedROMDecreasedROMNoEffect:32704} {BetterWorseNoEffect:32699}  *** *** {ProducesAbolishesIncreases:32702} {BetterWorseNobetterNoworse:32703} {IncreasedROMDecreasedROMNoEffect:32704} {BetterWorseNoEffect:32699}  *** *** {ProducesAbolishesIncreases:32702} {BetterWorseNobetterNoworse:32703} {IncreasedROMDecreasedROMNoEffect:32704} {BetterWorseNoEffect:32699}  *** *** {ProducesAbolishesIncreases:32702} {BetterWorseNobetterNoworse:32703} {IncreasedROMDecreasedROMNoEffect:32704} {BetterWorseNoEffect:32699}   STATIC TESTS {SittingslouchedErect:32705}  OTHER TESTS: ***  Assessment  Provisional Classification: {DerangementDysfunctionPosturealOTHER:32925} Directional Preference: {FlexionExtensionSideglideOther:32927} Other: {SPINALOTHER:33021} Drivers of Pain/Disability: - Comorbidities: *** - Cognitive-Emotional: *** - Contextual: ***  Plan  PRINCIPLES OF MANAGEMENT Education: *** Exercise Type: ***  Frequency: ***  Other Exercises / Interventions: *** Management Goals: ***     TREATMENT                                                                                                                               PATIENT EDUCATION:  Education details: *** Person educated: {Person educated:25204} Education method: {Education Method:25205} Education comprehension: {Education Comprehension:25206}  HOME EXERCISE PROGRAM: ***  ASSESSMENT:  CLINICAL IMPRESSION: Patient is a 59 y.o. female referred to outpatient physical therapy with a medical diagnosis of right lumbar radiculitis who presents with signs and symptoms consistent with ***. Patient presents with significant *** impairments that are limiting ability to complete *** without difficulty. Patient will benefit  from skilled physical therapy  intervention to address current body structure impairments and activity limitations to improve function and work towards goals set in current POC in order to return to prior level of function or maximal functional improvement.   Mechanical sensitivities: ***   OBJECTIVE IMPAIRMENTS: {opptimpairments:25111}.   ACTIVITY LIMITATIONS: {activitylimitations:27494}  PARTICIPATION LIMITATIONS: {participationrestrictions:25113}  PERSONAL FACTORS: {Personal factors:25162} are also affecting patient's functional outcome.   REHAB POTENTIAL: {rehabpotential:25112}  CLINICAL DECISION MAKING: {clinical decision making:25114}  EVALUATION COMPLEXITY: {Evaluation complexity:25115}   GOALS: Goals reviewed with patient? {yes/no:20286}  SHORT TERM GOALS: Target date: 05/25/2024  Patient will be independent with initial home exercise program for self-management of symptoms. Baseline: {HEPbaseline4:27310} (05/11/24); Goal status: INITIAL  LONG TERM GOALS: Target date: 08/03/2024  Patient will be independent with a long-term home exercise program for self-management of symptoms.  Baseline: {HEPbaseline4:27310} (05/11/24); Goal status: INITIAL  2.  Patient will demonstrate improved {SarasLTGPRO:32233} to demonstrate improvement in overall condition and self-reported functional ability.  Baseline: {Sarasgoalbaseline:32234} (05/11/24); Goal status: INITIAL  3.  *** Baseline: {Sarasgoalbaseline:32234} (05/11/24); Goal status: INITIAL  4.  *** Baseline: {Sarasgoalbaseline:32234} (05/11/24); Goal status: INITIAL  5.  Patient will demonstrate improvement in Patient Specific Functional Scale (PSFS) of equal or greater than 8/10 points to reflect clinically significant improvement in patient's most valued functional activities. Baseline: {Sarasgoalbaseline:32234} (05/11/24); Goal status: INITIAL  6.  Patient will report NPRS equal or less than 3/10 during functional activities during the last 2 weeks  to improve their abilitly to complete community, work and/or recreational activities with less limitation. Baseline: ***/10 (05/11/24); Goal status: INITIAL   PLAN:  PT FREQUENCY: {rehab frequency:25116}  PT DURATION: {rehab duration:25117}  PLANNED INTERVENTIONS: {rehab planned interventions:25118::97110-Therapeutic exercises,97530- Therapeutic [phone]- Neuromuscular re-education,97535- Self Rjmz,02859- Manual therapy,Patient/Family education}.  PLAN FOR NEXT SESSION: ***   Camie SAUNDERS. Juli, PT, DPT, Cert. MDT, PRA-C 05/11/24, 1:30 PM  [facility] [address] [place], [place] [address] P: [phone] I F: [phone]   "

## 2024-05-12 ENCOUNTER — Ambulatory Visit

## 2024-05-16 ENCOUNTER — Ambulatory Visit

## 2024-05-16 NOTE — Progress Notes (Signed)
 [facility] Refill Coordination Note    Specialty Medication(s) to be Shipped:   Inflammatory Disorders: Cosentyx     Other medication(s) to be shipped: No additional medications requested for fill at this time    Specialty Medications not needed at this time: N/A     Tracy Robles, DOB: 09/02/1965  Phone: There are no phone numbers on file.      All above HIPAA information was verified with patient.     Was a nurse, learning disability used for this call? No    Completed refill call assessment today to schedule patient's medication shipment from the [facility]  [phone]).  All relevant notes have been reviewed.     Specialty medication(s) and dose(s) confirmed: Regimen is correct and unchanged.   Changes to medications: Tracy Robles reports no changes at this time.  Changes to insurance: No  New side effects reported not previously addressed with a pharmacist or physician: None reported  Questions for the pharmacist: No    Confirmed patient received a Conservation Officer, Historic Buildings and a Surveyor, Mining with first shipment. The patient will receive a drug information handout for each medication shipped and additional FDA Medication Guides as required.       DISEASE/MEDICATION-SPECIFIC INFORMATION        N/A    SPECIALTY MEDICATION ADHERENCE     Medication Adherence    Patient reported X missed doses in the last month: 0  Specialty Medication: Cosentyx             Were doses missed due to medication being on hold? No    Cosentyx  300 mg: 0 doses of medicine on hand      Specialty medication is an injection or given on a cycle: Yes, Next injection is scheduled for asap - is a bit overdue, will have on 2/4.    REFERRAL TO PHARMACIST     Referral to the pharmacist: Not needed      [facility]     Shipping address confirmed in Epic.     Cost and Payment: Patient has a $0 copay, payment information is not required.    Delivery Scheduled: Yes, Expected medication delivery date: 2/4. Medication will be delivered via Next Day Courier to the prescription address in Epic WAM.    [facility]in [facility]  Specialty Pharmacist

## 2024-05-17 DIAGNOSIS — N83201 Unspecified ovarian cyst, right side: Secondary | ICD-10-CM

## 2024-05-17 DIAGNOSIS — N9089 Other specified noninflammatory disorders of vulva and perineum: Principal | ICD-10-CM

## 2024-05-17 MED ORDER — NYSTATIN-TRIAMCINOLONE 100,000 UNIT/G-0.1 % TOPICAL CREAM
TOPICAL | 1 refills | 0.00000 days | Status: CP
Start: 2024-05-17 — End: 2025-05-17

## 2024-05-17 MED FILL — COSENTYX UNOREADY PEN 300 MG/2 ML SUBCUTANEOUS PEN INJECTOR: SUBCUTANEOUS | 28 days supply | Qty: 2 | Fill #2

## 2024-05-19 ENCOUNTER — Ambulatory Visit

## 2024-05-20 NOTE — Telephone Encounter (Signed)
 PA initiated for Nystatin -Triamcinolone  via CoverMyMeds    Key: [id number]

## 2024-05-23 ENCOUNTER — Ambulatory Visit

## 2024-05-23 ENCOUNTER — Ambulatory Visit: Admitting: Physical Therapy

## 2024-05-25 ENCOUNTER — Ambulatory Visit: Admitting: Physical Therapy

## 2024-05-25 ENCOUNTER — Ambulatory Visit

## 2024-05-30 ENCOUNTER — Ambulatory Visit

## 2024-06-01 ENCOUNTER — Ambulatory Visit: Admitting: Physical Therapy

## 2024-06-01 ENCOUNTER — Ambulatory Visit

## 2024-06-06 ENCOUNTER — Ambulatory Visit: Admitting: Physical Therapy

## 2024-06-06 ENCOUNTER — Ambulatory Visit

## 2024-06-08 ENCOUNTER — Ambulatory Visit: Admitting: Physical Therapy

## 2024-06-08 ENCOUNTER — Ambulatory Visit

## 2024-06-13 ENCOUNTER — Ambulatory Visit

## 2024-06-13 ENCOUNTER — Ambulatory Visit: Admitting: Physical Therapy

## 2024-06-15 ENCOUNTER — Ambulatory Visit

## 2024-06-20 ENCOUNTER — Ambulatory Visit

## 2024-06-20 ENCOUNTER — Ambulatory Visit: Admitting: Physical Therapy

## 2024-06-22 ENCOUNTER — Ambulatory Visit: Admitting: Physical Therapy

## 2024-06-22 ENCOUNTER — Ambulatory Visit

## 2024-06-27 ENCOUNTER — Ambulatory Visit: Admitting: Physical Therapy

## 2024-06-27 ENCOUNTER — Ambulatory Visit

## 2024-06-29 ENCOUNTER — Ambulatory Visit

## 2024-06-29 ENCOUNTER — Ambulatory Visit: Admitting: Physical Therapy

## 2024-07-04 ENCOUNTER — Ambulatory Visit: Admitting: Physical Therapy

## 2024-07-06 ENCOUNTER — Ambulatory Visit: Admitting: Physical Therapy

## 2024-07-11 ENCOUNTER — Ambulatory Visit: Admitting: Physical Therapy

## 2024-07-13 ENCOUNTER — Ambulatory Visit: Admitting: Physical Therapy

## 2024-07-18 ENCOUNTER — Ambulatory Visit: Admitting: Physical Therapy

## 2024-07-20 ENCOUNTER — Ambulatory Visit: Admitting: Physical Therapy
